# Patient Record
Sex: Male | Born: 1935 | Race: White | Hispanic: No | Marital: Married | Smoking: Former smoker
Health system: Southern US, Community
[De-identification: ages and names within clinical notes are randomized; demographics above are authoritative.]

## PROBLEM LIST (undated history)

## (undated) DIAGNOSIS — F0280 Dementia in other diseases classified elsewhere without behavioral disturbance: Secondary | ICD-10-CM

## (undated) DIAGNOSIS — F0390 Unspecified dementia without behavioral disturbance: Secondary | ICD-10-CM

## (undated) DIAGNOSIS — C61 Malignant neoplasm of prostate: Secondary | ICD-10-CM

## (undated) DIAGNOSIS — I10 Essential (primary) hypertension: Secondary | ICD-10-CM

## (undated) DIAGNOSIS — E785 Hyperlipidemia, unspecified: Secondary | ICD-10-CM

## (undated) DIAGNOSIS — D649 Anemia, unspecified: Secondary | ICD-10-CM

## (undated) HISTORY — DX: Hyperlipidemia, unspecified: E78.5

## (undated) HISTORY — DX: Anemia, unspecified: D64.9

## (undated) HISTORY — DX: Essential (primary) hypertension: I10

## (undated) HISTORY — PX: CARPAL TUNNEL RELEASE: SHX101

## (undated) HISTORY — PX: HERNIA REPAIR: SHX51

## (undated) HISTORY — DX: Malignant neoplasm of prostate: C61

## (undated) HISTORY — PX: KNEE SURGERY: SHX244

---

## 1999-08-24 HISTORY — PX: PROSTATECTOMY: SHX69

## 2011-09-07 DIAGNOSIS — L570 Actinic keratosis: Secondary | ICD-10-CM | POA: Diagnosis not present

## 2011-09-07 DIAGNOSIS — L821 Other seborrheic keratosis: Secondary | ICD-10-CM | POA: Diagnosis not present

## 2011-09-07 DIAGNOSIS — Z85828 Personal history of other malignant neoplasm of skin: Secondary | ICD-10-CM | POA: Diagnosis not present

## 2011-09-07 DIAGNOSIS — C44319 Basal cell carcinoma of skin of other parts of face: Secondary | ICD-10-CM | POA: Diagnosis not present

## 2011-09-07 DIAGNOSIS — I781 Nevus, non-neoplastic: Secondary | ICD-10-CM | POA: Diagnosis not present

## 2011-11-03 DIAGNOSIS — H35379 Puckering of macula, unspecified eye: Secondary | ICD-10-CM | POA: Diagnosis not present

## 2011-11-03 DIAGNOSIS — H353190 Nonexudative age-related macular degeneration, unspecified eye, stage unspecified: Secondary | ICD-10-CM | POA: Diagnosis not present

## 2012-02-08 DIAGNOSIS — I10 Essential (primary) hypertension: Secondary | ICD-10-CM | POA: Diagnosis not present

## 2012-03-29 DIAGNOSIS — C61 Malignant neoplasm of prostate: Secondary | ICD-10-CM | POA: Diagnosis not present

## 2012-05-04 DIAGNOSIS — Z23 Encounter for immunization: Secondary | ICD-10-CM | POA: Diagnosis not present

## 2012-05-04 DIAGNOSIS — IMO0002 Reserved for concepts with insufficient information to code with codable children: Secondary | ICD-10-CM | POA: Diagnosis not present

## 2012-05-04 DIAGNOSIS — R319 Hematuria, unspecified: Secondary | ICD-10-CM | POA: Diagnosis not present

## 2012-05-04 DIAGNOSIS — D649 Anemia, unspecified: Secondary | ICD-10-CM | POA: Diagnosis not present

## 2012-05-04 DIAGNOSIS — E785 Hyperlipidemia, unspecified: Secondary | ICD-10-CM | POA: Diagnosis not present

## 2012-06-12 DIAGNOSIS — R0982 Postnasal drip: Secondary | ICD-10-CM | POA: Diagnosis not present

## 2012-06-12 DIAGNOSIS — J309 Allergic rhinitis, unspecified: Secondary | ICD-10-CM | POA: Diagnosis not present

## 2012-06-12 DIAGNOSIS — Q674 Other congenital deformities of skull, face and jaw: Secondary | ICD-10-CM | POA: Diagnosis not present

## 2012-06-12 DIAGNOSIS — J311 Chronic nasopharyngitis: Secondary | ICD-10-CM | POA: Diagnosis not present

## 2012-06-14 DIAGNOSIS — H903 Sensorineural hearing loss, bilateral: Secondary | ICD-10-CM | POA: Diagnosis not present

## 2012-06-21 DIAGNOSIS — Z85828 Personal history of other malignant neoplasm of skin: Secondary | ICD-10-CM | POA: Diagnosis not present

## 2012-06-21 DIAGNOSIS — C44519 Basal cell carcinoma of skin of other part of trunk: Secondary | ICD-10-CM | POA: Diagnosis not present

## 2012-06-21 DIAGNOSIS — L570 Actinic keratosis: Secondary | ICD-10-CM | POA: Diagnosis not present

## 2012-07-08 DIAGNOSIS — J069 Acute upper respiratory infection, unspecified: Secondary | ICD-10-CM | POA: Diagnosis not present

## 2012-07-08 DIAGNOSIS — R0982 Postnasal drip: Secondary | ICD-10-CM | POA: Diagnosis not present

## 2012-09-22 DIAGNOSIS — L259 Unspecified contact dermatitis, unspecified cause: Secondary | ICD-10-CM | POA: Diagnosis not present

## 2012-09-22 DIAGNOSIS — E785 Hyperlipidemia, unspecified: Secondary | ICD-10-CM | POA: Diagnosis not present

## 2012-11-01 DIAGNOSIS — L538 Other specified erythematous conditions: Secondary | ICD-10-CM | POA: Diagnosis not present

## 2012-11-01 DIAGNOSIS — L570 Actinic keratosis: Secondary | ICD-10-CM | POA: Diagnosis not present

## 2013-03-21 DIAGNOSIS — Z8546 Personal history of malignant neoplasm of prostate: Secondary | ICD-10-CM | POA: Diagnosis not present

## 2013-03-21 DIAGNOSIS — D649 Anemia, unspecified: Secondary | ICD-10-CM | POA: Diagnosis not present

## 2013-03-21 DIAGNOSIS — I10 Essential (primary) hypertension: Secondary | ICD-10-CM | POA: Diagnosis not present

## 2013-03-21 DIAGNOSIS — E785 Hyperlipidemia, unspecified: Secondary | ICD-10-CM | POA: Diagnosis not present

## 2013-03-21 DIAGNOSIS — Z1331 Encounter for screening for depression: Secondary | ICD-10-CM | POA: Diagnosis not present

## 2013-03-21 DIAGNOSIS — L989 Disorder of the skin and subcutaneous tissue, unspecified: Secondary | ICD-10-CM | POA: Diagnosis not present

## 2013-03-21 DIAGNOSIS — IMO0002 Reserved for concepts with insufficient information to code with codable children: Secondary | ICD-10-CM | POA: Diagnosis not present

## 2013-04-04 DIAGNOSIS — C44320 Squamous cell carcinoma of skin of unspecified parts of face: Secondary | ICD-10-CM | POA: Diagnosis not present

## 2013-04-04 DIAGNOSIS — L821 Other seborrheic keratosis: Secondary | ICD-10-CM | POA: Diagnosis not present

## 2013-04-04 DIAGNOSIS — C44721 Squamous cell carcinoma of skin of unspecified lower limb, including hip: Secondary | ICD-10-CM | POA: Diagnosis not present

## 2013-04-04 DIAGNOSIS — L570 Actinic keratosis: Secondary | ICD-10-CM | POA: Diagnosis not present

## 2013-04-04 DIAGNOSIS — L259 Unspecified contact dermatitis, unspecified cause: Secondary | ICD-10-CM | POA: Diagnosis not present

## 2013-05-07 DIAGNOSIS — L219 Seborrheic dermatitis, unspecified: Secondary | ICD-10-CM | POA: Diagnosis not present

## 2013-05-07 DIAGNOSIS — Z85828 Personal history of other malignant neoplasm of skin: Secondary | ICD-10-CM | POA: Diagnosis not present

## 2013-05-30 DIAGNOSIS — Z23 Encounter for immunization: Secondary | ICD-10-CM | POA: Diagnosis not present

## 2013-06-13 DIAGNOSIS — D485 Neoplasm of uncertain behavior of skin: Secondary | ICD-10-CM | POA: Diagnosis not present

## 2013-06-13 DIAGNOSIS — C44721 Squamous cell carcinoma of skin of unspecified lower limb, including hip: Secondary | ICD-10-CM | POA: Diagnosis not present

## 2013-08-22 DIAGNOSIS — B9789 Other viral agents as the cause of diseases classified elsewhere: Secondary | ICD-10-CM | POA: Diagnosis not present

## 2013-08-22 DIAGNOSIS — IMO0002 Reserved for concepts with insufficient information to code with codable children: Secondary | ICD-10-CM | POA: Diagnosis not present

## 2013-08-22 DIAGNOSIS — R05 Cough: Secondary | ICD-10-CM | POA: Diagnosis not present

## 2013-11-08 DIAGNOSIS — H353190 Nonexudative age-related macular degeneration, unspecified eye, stage unspecified: Secondary | ICD-10-CM | POA: Diagnosis not present

## 2014-01-01 DIAGNOSIS — L570 Actinic keratosis: Secondary | ICD-10-CM | POA: Diagnosis not present

## 2014-01-01 DIAGNOSIS — D235 Other benign neoplasm of skin of trunk: Secondary | ICD-10-CM | POA: Diagnosis not present

## 2014-01-01 DIAGNOSIS — C44519 Basal cell carcinoma of skin of other part of trunk: Secondary | ICD-10-CM | POA: Diagnosis not present

## 2014-01-01 DIAGNOSIS — L538 Other specified erythematous conditions: Secondary | ICD-10-CM | POA: Diagnosis not present

## 2014-03-15 DIAGNOSIS — R5381 Other malaise: Secondary | ICD-10-CM | POA: Diagnosis not present

## 2014-03-15 DIAGNOSIS — IMO0002 Reserved for concepts with insufficient information to code with codable children: Secondary | ICD-10-CM | POA: Diagnosis not present

## 2014-03-15 DIAGNOSIS — I10 Essential (primary) hypertension: Secondary | ICD-10-CM | POA: Diagnosis not present

## 2014-03-15 DIAGNOSIS — R5383 Other fatigue: Secondary | ICD-10-CM | POA: Diagnosis not present

## 2014-03-18 DIAGNOSIS — E785 Hyperlipidemia, unspecified: Secondary | ICD-10-CM | POA: Diagnosis not present

## 2014-08-29 ENCOUNTER — Encounter: Payer: Self-pay | Admitting: Cardiovascular Disease

## 2014-08-29 ENCOUNTER — Ambulatory Visit (INDEPENDENT_AMBULATORY_CARE_PROVIDER_SITE_OTHER): Payer: Medicare Other | Admitting: Cardiovascular Disease

## 2014-08-29 VITALS — BP 98/54 | HR 66 | Ht 70.0 in | Wt 163.8 lb

## 2014-08-29 DIAGNOSIS — G9001 Carotid sinus syncope: Secondary | ICD-10-CM

## 2014-08-29 DIAGNOSIS — R55 Syncope and collapse: Secondary | ICD-10-CM

## 2014-08-29 MED ORDER — AMLODIPINE BESYLATE 2.5 MG PO TABS: 2.5000 mg | ORAL_TABLET | Freq: Every day | ORAL | Status: DC

## 2014-08-29 NOTE — Patient Instructions (Addendum)
Your physician has recommended you make the following change in your medication:  1) DECREASE Amlodipine to 2.5mg  daily  Your physician has requested that you have an echocardiogram. Echocardiography is a painless test that uses sound waves to create images of your heart. It provides your doctor with information about the size and shape of your heart and how well your heart's chambers and valves are working. This procedure takes approximately one hour. There are no restrictions for this procedure.  Your physician has recommended that you wear an event monitor. Event monitors are medical devices that record the heart's electrical activity. Doctors most often [place] these monitors to diagnose arrhythmias. Arrhythmias are problems with the speed or rhythm of the heartbeat. The monitor is a small, portable device. You can wear one while you do your normal daily activities. This is usually used to diagnose what is causing palpitations/syncope (passing out).  Your physician has requested that you have a carotid duplex. This test is an ultrasound of the carotid arteries in your neck. It looks at blood flow through these arteries that supply the brain with blood. Allow one hour for this exam. There are no restrictions or special instructions.  Your physician recommends that you schedule a follow-up appointment in: 6 weeks with Dr. Acie Fredrickson.

## 2014-08-29 NOTE — Progress Notes (Signed)
Boris Sharper Date of Birth  [birth year]       [facility]    [employer] [address], [place], [place]  [address]   [place],   [address] [place]   Fax  [phone]     Fax [phone]  Problem List: 1. Hypertension  History of Present Illness:  Mr. Garden is an 81 yo , hx of HTN. Works out regularly.  Does the elliptical without any difficulty. Has had some mild episodes of faint feeling.  Occurred during  Exercising.  Occasionally has orthostatic hypotension. A month ago. He was driving and felt a bit faint.  Thought that he might need to pull over to the side of the road. Drinks water while exercising,   Episodes are not always related to exercise  6-7 years ago he had an episode of very high BP.  He was started on BP meds at that time. Resolved after several hours,    Non smoker ETOH 2 drinks a day Fhx:  +, mother died at 70, father died age 13 , sister died of complications related to alzheimers. , brother had valve replacement. Retired from [employer].   Moved from [place], [place] several years ago.  [place].    Current Outpatient Prescriptions  Medication Sig Dispense Refill  . amLODipine (NORVASC) 5 MG tablet Take 5 mg by mouth daily.    . cyanocobalamin 2000 MCG tablet Take 5,000 mcg by mouth daily.    . Fish Oil-Cholecalciferol (FISH OIL + D3) 1000-1000 MG-UNIT CAPS Take 1,400 mg by mouth daily.    . fluticasone (CUTIVATE) 0.05 % cream as needed.    [name] glucosamine-chondroitin 500-400 MG tablet Take 1 tablet by mouth 3 (three) times daily.    [name] losartan (COZAAR) 50 MG tablet Take 50 mg by mouth daily.    . Lutein-Zeaxanthin 25-5 MG CAPS Take by mouth daily.    . Multiple Vitamin (MULTIVITAMIN) tablet Take 1 tablet by mouth daily. CENTRUM SILVER    . simvastatin (ZOCOR) 20 MG tablet Take 20 mg by mouth daily.     No current facility-administered medications  for this visit.     No Known Allergies  Past Medical History  Diagnosis Date  . Hyperlipidemia   . Hypertension   . Anemia   . Prostate cancer     Past Surgical History  Procedure Laterality Date  . Prostatectomy  2001  . Hernia repair      MID 80'S    History  Smoking status  . Former Smoker  Smokeless tobacco  . Not on file    History  Alcohol Use  . Yes    Comment: SOCAILLY     Family History  Problem Relation Age of Onset  . CAD Mother   . CAD Father   . Dementia Sister   . Heart attack Sister   . Alzheimer's disease Brother     Reviw of Systems:  Reviewed in the HPI.  All other systems are negative.  Physical Exam: Blood pressure 98/54, pulse 66, height 5\' 10"  (1.778 m), weight 163 lb 12.8 oz (74.299 kg). Wt Readings from Last 3 Encounters:  08/29/14 163 lb 12.8 oz (74.299 kg)     General: Well developed, well nourished, in no acute distress.  Head: Normocephalic, atraumatic, sclera non-icteric, mucus membranes are moist,   Neck: Supple. Carotids are 2 + without bruits. No  JVD  With carotid sinus massage, he had a 3 second pause.   Lungs: Clear   Heart: Rr, ,normal s1s2  Abdomen: Soft, non-tender, non-distended with normal bowel sounds.  Msk:  Strength and tone are normal   Extremities: No clubbing or cyanosis. No edema.  Distal pedal pulses are 2+ and equal    Neuro: CN II - XII intact.  Alert and oriented X 3.   Psych:  Normal   ECG: Jan. 7, 2016:  NSR , RBBB,   Assessment / Plan:

## 2014-08-29 NOTE — Assessment & Plan Note (Addendum)
His episodes of syncope that occurred her symptoms through the day. He has episodes of orthostatic hypotension but clearly has episodes that are not related to orthostatic hypotension. I demonstrated carotid sinus hypersensitivity today but he does not think that his episodes of syncope and near-syncope are always due to twisting or turning his neck. He does describe getting dizzy whenever he looks up.  We'll get an echocardiogram for further assessment of his LV function. We'll get a carotid duplex scan. We'll place a 30 day event monitor.  Will decrease his dose of amlodipine to 2.5 a day to see if this helps.   Encouraged him to stay hydrated.  I will see him  in 6 weeks.

## 2014-08-29 NOTE — Assessment & Plan Note (Signed)
He had a 3 second pause with carotid sinus massage. I've advised him to not wear tight fitting collared shirts.

## 2014-09-03 DIAGNOSIS — Z0000 Encounter for general adult medical examination without abnormal findings: Secondary | ICD-10-CM | POA: Diagnosis not present

## 2014-09-03 DIAGNOSIS — I10 Essential (primary) hypertension: Secondary | ICD-10-CM | POA: Diagnosis not present

## 2014-09-03 DIAGNOSIS — Z125 Encounter for screening for malignant neoplasm of prostate: Secondary | ICD-10-CM | POA: Diagnosis not present

## 2014-09-03 DIAGNOSIS — E785 Hyperlipidemia, unspecified: Secondary | ICD-10-CM | POA: Diagnosis not present

## 2014-09-04 ENCOUNTER — Encounter (INDEPENDENT_AMBULATORY_CARE_PROVIDER_SITE_OTHER): Payer: Medicare Other

## 2014-09-04 ENCOUNTER — Ambulatory Visit (HOSPITAL_BASED_OUTPATIENT_CLINIC_OR_DEPARTMENT_OTHER): Payer: Medicare Other | Admitting: *Deleted

## 2014-09-04 ENCOUNTER — Ambulatory Visit (HOSPITAL_COMMUNITY): Payer: Medicare Other | Attending: Cardiovascular Disease | Admitting: Radiology

## 2014-09-04 ENCOUNTER — Encounter: Payer: Self-pay | Admitting: *Deleted

## 2014-09-04 DIAGNOSIS — R55 Syncope and collapse: Secondary | ICD-10-CM

## 2014-09-04 DIAGNOSIS — G9001 Carotid sinus syncope: Secondary | ICD-10-CM | POA: Diagnosis not present

## 2014-09-04 DIAGNOSIS — I6529 Occlusion and stenosis of unspecified carotid artery: Secondary | ICD-10-CM

## 2014-09-04 DIAGNOSIS — Z87891 Personal history of nicotine dependence: Secondary | ICD-10-CM | POA: Diagnosis not present

## 2014-09-04 DIAGNOSIS — I10 Essential (primary) hypertension: Secondary | ICD-10-CM | POA: Diagnosis not present

## 2014-09-04 DIAGNOSIS — E785 Hyperlipidemia, unspecified: Secondary | ICD-10-CM | POA: Insufficient documentation

## 2014-09-04 NOTE — Progress Notes (Signed)
Carotid Duplex Exam Performed - 1-39% bilateral ICA stensoes

## 2014-09-04 NOTE — Progress Notes (Signed)
Patient ID: Julian [name], male   DOB: 1926-08-01, 81 y.o.   MRN: [id number] [employer] 30 day cardiac event monitor applied to patient.

## 2014-09-04 NOTE — Progress Notes (Signed)
Echocardiogram performed.  

## 2014-09-06 DIAGNOSIS — Z1212 Encounter for screening for malignant neoplasm of rectum: Secondary | ICD-10-CM | POA: Diagnosis not present

## 2014-09-10 DIAGNOSIS — Z8546 Personal history of malignant neoplasm of prostate: Secondary | ICD-10-CM | POA: Diagnosis not present

## 2014-09-10 DIAGNOSIS — E785 Hyperlipidemia, unspecified: Secondary | ICD-10-CM | POA: Diagnosis not present

## 2014-09-10 DIAGNOSIS — R42 Dizziness and giddiness: Secondary | ICD-10-CM | POA: Diagnosis not present

## 2014-09-10 DIAGNOSIS — L989 Disorder of the skin and subcutaneous tissue, unspecified: Secondary | ICD-10-CM | POA: Diagnosis not present

## 2014-09-10 DIAGNOSIS — I10 Essential (primary) hypertension: Secondary | ICD-10-CM | POA: Diagnosis not present

## 2014-09-10 DIAGNOSIS — D649 Anemia, unspecified: Secondary | ICD-10-CM | POA: Diagnosis not present

## 2014-09-10 DIAGNOSIS — E039 Hypothyroidism, unspecified: Secondary | ICD-10-CM | POA: Diagnosis not present

## 2014-09-10 DIAGNOSIS — Z23 Encounter for immunization: Secondary | ICD-10-CM | POA: Diagnosis not present

## 2014-09-10 DIAGNOSIS — Z0000 Encounter for general adult medical examination without abnormal findings: Secondary | ICD-10-CM | POA: Diagnosis not present

## 2014-09-10 DIAGNOSIS — Z6823 Body mass index (BMI) 23.0-23.9, adult: Secondary | ICD-10-CM | POA: Diagnosis not present

## 2014-09-18 DIAGNOSIS — Z13820 Encounter for screening for osteoporosis: Secondary | ICD-10-CM | POA: Diagnosis not present

## 2014-09-25 DIAGNOSIS — I10 Essential (primary) hypertension: Secondary | ICD-10-CM | POA: Diagnosis not present

## 2014-10-11 ENCOUNTER — Telehealth: Payer: Self-pay | Admitting: Nurse Practitioner

## 2014-10-11 NOTE — Telephone Encounter (Signed)
Spoke with patient and notified him of monitor results; per Dr. Acie Fredrickson monitor shows normal sinus rhythm with no atrial fibrillation.  Patient verbalized understanding and gratitude and I scheduled his follow-up appointment for 3/2.

## 2014-10-17 DIAGNOSIS — D485 Neoplasm of uncertain behavior of skin: Secondary | ICD-10-CM | POA: Diagnosis not present

## 2014-10-17 DIAGNOSIS — Z85828 Personal history of other malignant neoplasm of skin: Secondary | ICD-10-CM | POA: Diagnosis not present

## 2014-10-17 DIAGNOSIS — L570 Actinic keratosis: Secondary | ICD-10-CM | POA: Diagnosis not present

## 2014-10-17 DIAGNOSIS — L821 Other seborrheic keratosis: Secondary | ICD-10-CM | POA: Diagnosis not present

## 2014-10-17 DIAGNOSIS — D229 Melanocytic nevi, unspecified: Secondary | ICD-10-CM | POA: Diagnosis not present

## 2014-10-17 DIAGNOSIS — C44612 Basal cell carcinoma of skin of right upper limb, including shoulder: Secondary | ICD-10-CM | POA: Diagnosis not present

## 2014-10-23 ENCOUNTER — Ambulatory Visit (INDEPENDENT_AMBULATORY_CARE_PROVIDER_SITE_OTHER): Payer: Medicare Other | Admitting: Cardiovascular Disease

## 2014-10-23 ENCOUNTER — Encounter: Payer: Self-pay | Admitting: Cardiovascular Disease

## 2014-10-23 VITALS — BP 136/64 | HR 68 | Ht 70.0 in | Wt 165.0 lb

## 2014-10-23 DIAGNOSIS — I10 Essential (primary) hypertension: Secondary | ICD-10-CM | POA: Diagnosis not present

## 2014-10-23 NOTE — Progress Notes (Signed)
Cardiology Office Note   Date:  10/23/2014   ID:  Julian [name], DOB Jun 17, 1926, MRN [id number]  PCP:  No primary care provider on file.  Cardiologist:   Martika Egler, Wonda Cheng, MD   No chief complaint on file.  1. Hypertension  History of Present Illness:  Julian [name] is an 81 yo , hx of HTN. Works out regularly. Does the elliptical without any difficulty. Has had some mild episodes of faint feeling. Occurred during Exercising. Occasionally has orthostatic hypotension. A month ago. He was driving and felt a bit faint. Thought that he might need to pull over to the side of the road. Drinks water while exercising,  Episodes are not always related to exercise  6-7 years ago he had an episode of very high BP. He was started on BP meds at that time. Resolved after several hours,   Non smoker ETOH 2 drinks a day Fhx: +, mother died at 80, father died age 39 , sister died of complications related to alzheimers. , brother had valve replacement. Retired from [employer].  Moved from [place], [place] several years ago. [place].    10/23/2014: Julian [name] is a 81 y.o. male who presents for follow-up for his high blood pressure.  we decreased the amlodipine due to some orthostatic hypotension. BP has been ok at home.   Eats well most of the time.  Occasionally gets a bit of extra salt.   Still very active.    Past Medical History  Diagnosis Date  . Hyperlipidemia   . Hypertension   . Anemia   . Prostate cancer     Past Surgical History  Procedure Laterality Date  . Prostatectomy  2001  . Hernia repair      MID 80'S     Current Outpatient Prescriptions  Medication Sig Dispense Refill  . amLODipine (NORVASC) 2.5 MG tablet Take 1 tablet (2.5 mg total) by mouth daily. 90 tablet 3  . Cyanocobalamin (B-12) 5000 MCG CAPS Take 1 tablet by mouth daily.    [name] Fish Oil-Cholecalciferol (FISH OIL + D3 PO) Take 1,400 mg by mouth daily.    . fluticasone  (CUTIVATE) 0.05 % cream as needed.    [name] glucosamine-chondroitin 500-400 MG tablet Take 1 tablet by mouth 3 (three) times daily.    [name] losartan (COZAAR) 50 MG tablet Take 50 mg by mouth daily.    . Lutein-Zeaxanthin 25-5 MG CAPS Take by mouth daily.    . Multiple Vitamin (MULTIVITAMIN) tablet Take 1 tablet by mouth daily. CENTRUM SILVER    . simvastatin (ZOCOR) 20 MG tablet Take 20 mg by mouth daily.     No current facility-administered medications for this visit.    Allergies:   Review of patient's allergies indicates no known allergies.    Social History:  The patient  reports that he has quit smoking. He does not have any smokeless tobacco history on file. He reports that he drinks alcohol. He reports that he does not use illicit drugs.   Family History:  The patient's family history includes Alzheimer's disease in his brother; CAD in his father and mother; Dementia in his sister; Heart attack in his sister.    ROS:  Please see the history of present illness.    Review of Systems: Constitutional:  denies fever, chills, diaphoresis, appetite change and fatigue.  HEENT: denies photophobia, eye pain, redness, hearing loss, ear pain, congestion, sore throat, rhinorrhea, sneezing, neck pain, neck stiffness and tinnitus.  Respiratory: denies SOB, DOE, cough, chest tightness, and wheezing.  Cardiovascular: denies chest pain, palpitations and leg swelling.  Gastrointestinal: denies nausea, vomiting, abdominal pain, diarrhea, constipation, blood in stool.  Genitourinary: denies dysuria, urgency, frequency, hematuria, flank pain and difficulty urinating.  Musculoskeletal: denies  myalgias, back pain, joint swelling, arthralgias and gait problem.   Skin: denies pallor, rash and wound.  Neurological: denies dizziness, seizures, syncope, weakness, light-headedness, numbness and headaches.   Hematological: denies adenopathy, easy bruising, personal or family bleeding history.   Psychiatric/ Behavioral: denies suicidal ideation, mood changes, confusion, nervousness, sleep disturbance and agitation.       All other systems are reviewed and negative.    PHYSICAL EXAM: VS:  BP 136/64 mmHg  Pulse 68  Ht 5\' 10"  (1.778 m)  Wt 165 lb (74.844 kg)  BMI 23.68 kg/m2  SpO2 96% , BMI Body mass index is 23.68 kg/(m^2). GEN: Well nourished, well developed, in no acute distress HEENT: normal Neck: no JVD, carotid bruits, or masses Cardiac: RRR; 2/6 systolic murmur , rubs, or gallops,no edema  Respiratory:  clear to auscultation bilaterally, normal work of breathing GI: soft, nontender, nondistended, + BS MS: no deformity or atrophy Skin: warm and dry, no rash Neuro:  Strength and sensation are intact Psych: normal   EKG:  EKG is not ordered today.    Recent Labs: No results found for requested labs within last 365 days.    Lipid Panel No results found for: CHOL, TRIG, HDL, CHOLHDL, VLDL, LDLCALC, LDLDIRECT    Wt Readings from Last 3 Encounters:  10/23/14 165 lb (74.844 kg)  08/29/14 163 lb 12.8 oz (74.299 kg)      Other studies Reviewed: Additional studies/ records that were reviewed today include: . Review of the above records demonstrates:    ASSESSMENT AND PLAN:  1.  Essential hypertension:- [name] is doing fairly well. We'll continue with his same medications. He's relatively healthy.  2. Aortic sclerosis: He has a soft systolic murmur. Echocardiogram performed in January shows ossification of his aortic valve. There is no significant stenosis. He also has mild tricuspid regurgitation.  3. Tricuspid regurgitation: Benign. We'll continue to follow.   Current medicines are reviewed at length with the patient today.  The patient does not have concerns regarding medicines.  The following changes have been made:  no change   Disposition:   FU with me in 1 year.     Signed, Tehilla Coffel, Wonda Cheng, MD  10/23/2014 11:06 AM    [facility] [place], [place], [place]  [address] Phone: [phone]; Fax: [phone]

## 2014-10-23 NOTE — Patient Instructions (Signed)
Your physician recommends that you continue on your current medications as directed. Please refer to the Current Medication list given to you today.  Your physician wants you to follow-up in: 1 year with Dr. Nahser.  You will receive a reminder letter in the mail two months in advance. If you don't receive a letter, please call our office to schedule the follow-up appointment.  

## 2014-10-31 DIAGNOSIS — Z6823 Body mass index (BMI) 23.0-23.9, adult: Secondary | ICD-10-CM | POA: Diagnosis not present

## 2014-10-31 DIAGNOSIS — M810 Age-related osteoporosis without current pathological fracture: Secondary | ICD-10-CM | POA: Diagnosis not present

## 2014-12-09 DIAGNOSIS — H3531 Nonexudative age-related macular degeneration: Secondary | ICD-10-CM | POA: Diagnosis not present

## 2014-12-09 DIAGNOSIS — H524 Presbyopia: Secondary | ICD-10-CM | POA: Diagnosis not present

## 2014-12-26 DIAGNOSIS — D485 Neoplasm of uncertain behavior of skin: Secondary | ICD-10-CM | POA: Diagnosis not present

## 2014-12-26 DIAGNOSIS — C44612 Basal cell carcinoma of skin of right upper limb, including shoulder: Secondary | ICD-10-CM | POA: Diagnosis not present

## 2014-12-26 DIAGNOSIS — D074 Carcinoma in situ of penis: Secondary | ICD-10-CM | POA: Diagnosis not present

## 2015-01-08 DIAGNOSIS — D048 Carcinoma in situ of skin of other sites: Secondary | ICD-10-CM | POA: Diagnosis not present

## 2015-02-11 DIAGNOSIS — H3531 Nonexudative age-related macular degeneration: Secondary | ICD-10-CM | POA: Diagnosis not present

## 2015-04-22 ENCOUNTER — Encounter: Payer: Self-pay | Admitting: Cardiovascular Disease

## 2015-04-22 ENCOUNTER — Ambulatory Visit (INDEPENDENT_AMBULATORY_CARE_PROVIDER_SITE_OTHER): Payer: Medicare Other | Admitting: Cardiovascular Disease

## 2015-04-22 VITALS — BP 110/48 | HR 93 | Ht 70.0 in | Wt 164.8 lb

## 2015-04-22 DIAGNOSIS — I10 Essential (primary) hypertension: Secondary | ICD-10-CM

## 2015-04-22 DIAGNOSIS — E785 Hyperlipidemia, unspecified: Secondary | ICD-10-CM | POA: Diagnosis not present

## 2015-04-22 MED ORDER — ATORVASTATIN CALCIUM 40 MG PO TABS: 40.0000 mg | ORAL_TABLET | Freq: Every day | ORAL | Status: DC

## 2015-04-22 MED ORDER — LOSARTAN POTASSIUM 100 MG PO TABS: 100.0000 mg | ORAL_TABLET | Freq: Every day | ORAL | Status: DC

## 2015-04-22 NOTE — Patient Instructions (Addendum)
Medication Instructions:  STOP Simvastatin STOP Amlodipine INCREASE Losartan to 100 mg once daily START Atorvastatin 40 mg once daily  Labwork: Your physician recommends that you return for lab work in: 3 months on the day of or a few days before your office visit with Dr. Acie Fredrickson.  You will need to FAST for this appointment - nothing to eat or drink after midnight the night before except water.   Testing/Procedures: None Ordered   Follow-Up: Your physician recommends that you schedule a follow-up appointment in: 3 months with Dr. Acie Fredrickson.

## 2015-04-22 NOTE — Progress Notes (Signed)
Cardiology Office Note   Date:  04/22/2015   ID:  Julian [name], DOB July 24, 1926, MRN [id number]  PCP:  No primary care provider on file.  Cardiologist:   Macario Shear, Wonda Cheng, MD   Chief Complaint  Patient presents with  . Essential hypertension   1. Hypertension  History of Present Illness:  Julian [name] is an 81 yo , hx of HTN. Works out regularly. Does the elliptical without any difficulty. Has had some mild episodes of faint feeling. Occurred during Exercising. Occasionally has orthostatic hypotension. A month ago. He was driving and felt a bit faint. Thought that he might need to pull over to the side of the road. Drinks water while exercising,  Episodes are not always related to exercise  6-7 years ago he had an episode of very high BP. He was started on BP meds at that time. Resolved after several hours,   Non smoker ETOH 2 drinks a day Fhx: +, mother died at 75, father died age 45 , sister died of complications related to alzheimers. , brother had valve replacement. Retired from [employer].  Moved from [place], [place] several years ago. [place].    10/23/2014: Julian [name] is a 81 y.o. male who presents for follow-up for his high blood pressure.  we decreased the amlodipine due to some orthostatic hypotension. BP has been ok at home.   Eats well most of the time.  Occasionally gets a bit of extra salt.   Still very active.    April 22, 2015:  still having some orthostatic hypotension.  BP is normal at  Has had some occasional palpitations  - also sees it registers on his BP cuff. Gives an error message .  His amlodipine dose was reduced about a year ago .  Still has episodes of dizziness - only lasts about 2-3 seconds  These episodes can occur while seated - and even had occurred while driving  Do not occur when he has been exercising .  Does the elliptical machine for 40 minutes without any CP or dyspnea, no dizziness  Then  works out with [employer].    Past Medical History  Diagnosis Date  . Hyperlipidemia   . Hypertension   . Anemia   . Prostate cancer     Past Surgical History  Procedure Laterality Date  . Prostatectomy  2001  . Hernia repair      MID 80'S     Current Outpatient Prescriptions  Medication Sig Dispense Refill  . alendronate (FOSAMAX) 70 MG tablet Take 70 mg by mouth once a week. Take with a full glass of water on an empty stomach.    [name] amLODipine (NORVASC) 2.5 MG tablet Take 1 tablet (2.5 mg total) by mouth daily. 90 tablet 3  . Cyanocobalamin (B-12) 5000 MCG CAPS Take 1 tablet by mouth daily.    [name] Fish Oil-Cholecalciferol (FISH OIL + D3 PO) Take 1,400 mg by mouth daily.    . fluticasone (CUTIVATE) 0.05 % cream Apply 1 application topically 2 (two) times daily.    . fluticasone (FLONASE) 50 MCG/ACT nasal spray Place 2 sprays into both nostrils as needed for allergies or rhinitis.    [name] glucosamine-chondroitin 500-400 MG tablet Take 1 tablet by mouth 3 (three) times daily.    [name] losartan (COZAAR) 50 MG tablet Take 50 mg by mouth daily.    . Lutein-Zeaxanthin 25-5 MG CAPS Take by mouth daily.    . Multiple Vitamin (MULTIVITAMIN) tablet Take  1 tablet by mouth daily. CENTRUM SILVER    . simvastatin (ZOCOR) 20 MG tablet Take 20 mg by mouth daily.     No current facility-administered medications for this visit.    Allergies:   Review of patient's allergies indicates no known allergies.    Social History:  The patient  reports that he has quit smoking. He does not have any smokeless tobacco history on file. He reports that he drinks alcohol. He reports that he does not use illicit drugs.   Family History:  The patient's family history includes Alzheimer's disease in his brother; CAD in his father and mother; Dementia in his sister; Heart attack in his sister.    ROS:  Please see the history of present illness.    Review of Systems: Constitutional:  denies fever, chills, diaphoresis,  appetite change and fatigue.  HEENT: denies photophobia, eye pain, redness, hearing loss, ear pain, congestion, sore throat, rhinorrhea, sneezing, neck pain, neck stiffness and tinnitus.  Respiratory: denies SOB, DOE, cough, chest tightness, and wheezing.  Cardiovascular: denies chest pain, palpitations and leg swelling.  Gastrointestinal: denies nausea, vomiting, abdominal pain, diarrhea, constipation, blood in stool.  Genitourinary: denies dysuria, urgency, frequency, hematuria, flank pain and difficulty urinating.  Musculoskeletal: denies  myalgias, back pain, joint swelling, arthralgias and gait problem.   Skin: denies pallor, rash and wound.  Neurological: denies dizziness, seizures, syncope, weakness, light-headedness, numbness and headaches.   Hematological: denies adenopathy, easy bruising, personal or family bleeding history.  Psychiatric/ Behavioral: denies suicidal ideation, mood changes, confusion, nervousness, sleep disturbance and agitation.       All other systems are reviewed and negative.    PHYSICAL EXAM: VS:  BP 110/48 mmHg  Pulse 93  Ht 5\' 10"  (1.778 m)  Wt 74.753 kg (164 lb 12.8 oz)  BMI 23.65 kg/m2  SpO2 93% , BMI Body mass index is 23.65 kg/(m^2). GEN: Well nourished, well developed, in no acute distress HEENT: normal Neck: no JVD, carotid bruits, or masses Cardiac: RRR; 2/6 systolic murmur , rubs, or gallops,no edema  Respiratory:  clear to auscultation bilaterally, normal work of breathing GI: soft, nontender, nondistended, + BS MS: no deformity or atrophy Skin: warm and dry, no rash Neuro:  Strength and sensation are intact Psych: normal   EKG:  EKG is not ordered today.    Recent Labs: No results found for requested labs within last 365 days.    Lipid Panel No results found for: CHOL, TRIG, HDL, CHOLHDL, VLDL, LDLCALC, LDLDIRECT    Wt Readings from Last 3 Encounters:  04/22/15 74.753 kg (164 lb 12.8 oz)  10/23/14 74.844 kg (165 lb)    08/29/14 74.299 kg (163 lb 12.8 oz)      Other studies Reviewed: Additional studies/ records that were reviewed today include: . Review of the above records demonstrates:    ASSESSMENT AND PLAN:  1.  Essential hypertension:- Ivis continues to have episodes of dizziness that last for 2 or 3 seconds. He just, his blood pressure has been slightly elevated. We had long discussion about the fact that as long as he was having symptoms of orthostatic hypertension that it would be difficult to add additional medications to control his blood pressure better. As a trial, we will discontinue the amlodipine and increase his losartan to 100 mg a day.  We may need to add low-dose carvedilol at his next visit.  2. Aortic sclerosis: He has a soft systolic murmur. Echocardiogram performed in January shows ossification of his aortic  valve. There is no significant stenosis. He also has mild tricuspid regurgitation.  3. Tricuspid regurgitation: Benign. We'll continue to follow.  4. Hyperlipidemia: Patient has been on simvastatin. His wife tells me that his most recent lipid levels have been mildly elevated. We will discontinue the simvastatin and start him on atorvastatin 40 mg a day. We'll check fasting lipid, liver enzymes, basic medical profile in 3 months.   Current medicines are reviewed at length with the patient today.  The patient does not have concerns regarding medicines   Disposition:   FU with me in 3 months      Signed, Cyanna Neace, Wonda Cheng, MD  04/22/2015 11:07 AM    [facility], [place], [place]  [address] Phone: [phone]; Fax: [phone]

## 2015-05-13 DIAGNOSIS — H04123 Dry eye syndrome of bilateral lacrimal glands: Secondary | ICD-10-CM | POA: Diagnosis not present

## 2015-05-13 DIAGNOSIS — H119 Unspecified disorder of conjunctiva: Secondary | ICD-10-CM | POA: Diagnosis not present

## 2015-05-13 DIAGNOSIS — H3532 Exudative age-related macular degeneration: Secondary | ICD-10-CM | POA: Diagnosis not present

## 2015-05-15 DIAGNOSIS — C884 Extranodal marginal zone B-cell lymphoma of mucosa-associated lymphoid tissue [MALT-lymphoma]: Secondary | ICD-10-CM | POA: Diagnosis not present

## 2015-05-15 DIAGNOSIS — H31001 Unspecified chorioretinal scars, right eye: Secondary | ICD-10-CM | POA: Diagnosis not present

## 2015-05-15 DIAGNOSIS — Z961 Presence of intraocular lens: Secondary | ICD-10-CM | POA: Diagnosis not present

## 2015-05-15 DIAGNOSIS — D4989 Neoplasm of unspecified behavior of other specified sites: Secondary | ICD-10-CM | POA: Diagnosis not present

## 2015-05-15 DIAGNOSIS — Z9842 Cataract extraction status, left eye: Secondary | ICD-10-CM | POA: Diagnosis not present

## 2015-05-15 DIAGNOSIS — H3530 Unspecified macular degeneration: Secondary | ICD-10-CM | POA: Diagnosis not present

## 2015-05-15 DIAGNOSIS — Z8546 Personal history of malignant neoplasm of prostate: Secondary | ICD-10-CM | POA: Diagnosis not present

## 2015-05-15 DIAGNOSIS — Z9841 Cataract extraction status, right eye: Secondary | ICD-10-CM | POA: Diagnosis not present

## 2015-05-15 DIAGNOSIS — I10 Essential (primary) hypertension: Secondary | ICD-10-CM | POA: Diagnosis not present

## 2015-05-15 DIAGNOSIS — Z87891 Personal history of nicotine dependence: Secondary | ICD-10-CM | POA: Diagnosis not present

## 2015-05-15 DIAGNOSIS — H1189 Other specified disorders of conjunctiva: Secondary | ICD-10-CM | POA: Diagnosis not present

## 2015-05-22 DIAGNOSIS — Z8669 Personal history of other diseases of the nervous system and sense organs: Secondary | ICD-10-CM | POA: Diagnosis not present

## 2015-05-22 DIAGNOSIS — Z87891 Personal history of nicotine dependence: Secondary | ICD-10-CM | POA: Diagnosis not present

## 2015-05-22 DIAGNOSIS — Z9841 Cataract extraction status, right eye: Secondary | ICD-10-CM | POA: Diagnosis not present

## 2015-05-22 DIAGNOSIS — I10 Essential (primary) hypertension: Secondary | ICD-10-CM | POA: Diagnosis not present

## 2015-05-22 DIAGNOSIS — Z9889 Other specified postprocedural states: Secondary | ICD-10-CM | POA: Diagnosis not present

## 2015-05-22 DIAGNOSIS — Z9842 Cataract extraction status, left eye: Secondary | ICD-10-CM | POA: Diagnosis not present

## 2015-05-22 DIAGNOSIS — Z48810 Encounter for surgical aftercare following surgery on the sense organs: Secondary | ICD-10-CM | POA: Diagnosis not present

## 2015-06-05 DIAGNOSIS — C884 Extranodal marginal zone B-cell lymphoma of mucosa-associated lymphoid tissue [MALT-lymphoma]: Secondary | ICD-10-CM | POA: Diagnosis not present

## 2015-06-13 DIAGNOSIS — Z87891 Personal history of nicotine dependence: Secondary | ICD-10-CM | POA: Diagnosis not present

## 2015-06-13 DIAGNOSIS — C884 Extranodal marginal zone B-cell lymphoma of mucosa-associated lymphoid tissue [MALT-lymphoma]: Secondary | ICD-10-CM | POA: Diagnosis not present

## 2015-06-25 DIAGNOSIS — C884 Extranodal marginal zone B-cell lymphoma of mucosa-associated lymphoid tissue [MALT-lymphoma]: Secondary | ICD-10-CM | POA: Diagnosis not present

## 2015-06-26 DIAGNOSIS — C884 Extranodal marginal zone B-cell lymphoma of mucosa-associated lymphoid tissue [MALT-lymphoma]: Secondary | ICD-10-CM | POA: Diagnosis not present

## 2015-07-01 DIAGNOSIS — Z23 Encounter for immunization: Secondary | ICD-10-CM | POA: Diagnosis not present

## 2015-07-02 DIAGNOSIS — Z85828 Personal history of other malignant neoplasm of skin: Secondary | ICD-10-CM | POA: Diagnosis not present

## 2015-07-02 DIAGNOSIS — L821 Other seborrheic keratosis: Secondary | ICD-10-CM | POA: Diagnosis not present

## 2015-07-02 DIAGNOSIS — D225 Melanocytic nevi of trunk: Secondary | ICD-10-CM | POA: Diagnosis not present

## 2015-07-02 DIAGNOSIS — L812 Freckles: Secondary | ICD-10-CM | POA: Diagnosis not present

## 2015-07-04 DIAGNOSIS — C884 Extranodal marginal zone B-cell lymphoma of mucosa-associated lymphoid tissue [MALT-lymphoma]: Secondary | ICD-10-CM | POA: Diagnosis not present

## 2015-07-07 ENCOUNTER — Telehealth: Payer: Self-pay | Admitting: Cardiovascular Disease

## 2015-07-07 MED ORDER — AMLODIPINE BESYLATE 2.5 MG PO TABS: 2.5000 mg | ORAL_TABLET | Freq: Every day | ORAL | Status: DC

## 2015-07-07 NOTE — Telephone Encounter (Signed)
New message      Pt c/o BP issue: STAT if pt c/o blurred vision, one-sided weakness or slurred speech  1. What are your last 5 BP readings? 189/79--80 2. Are you having any other symptoms (ex. Dizziness, headache, blurred vision, passed out)? no 3. What is your BP issue?  bp is too high

## 2015-07-07 NOTE — Telephone Encounter (Signed)
Spoke with patient's wife, Julian [name], who states patient's BP has been elevated [id number] mmHg systolic and 123456 mmHg diastolic for at least the past month.  She states patient is getting ready to undergo treatment for lymphoma and when he went to [facility] for evaluation they were concerned about BP.  At last office visit on 8/30, patient's BP was 110/48.  Wife states she is not aware of it being that low in quite a while.  She verified that patient is taking Losartan 100 mg once daily.  Amlodipine was d/c'ed at last office visit due to hypotension.  She states patient denies dizziness or headache; she states monitor alerts that heart rate is also irregular.  She states they have verified accuracy of home monitor by checking her BP as well.  I scheduled patient to see Dr. Acie Fredrickson on Wed. 11/16 at 8:30 and advised her to call back if symptoms worsen or if patient has additional concerns or questions.  She thanked me for the call.

## 2015-07-07 NOTE — Telephone Encounter (Signed)
Left message on wife's voice mail that per Dr. Acie Fredrickson, patient can resume amlodipine 2.5 mg and follow-up with him on Wednesday.  I advised in the message to call the office with questions or concerns.

## 2015-07-08 DIAGNOSIS — C884 Extranodal marginal zone B-cell lymphoma of mucosa-associated lymphoid tissue [MALT-lymphoma]: Secondary | ICD-10-CM | POA: Diagnosis not present

## 2015-07-09 ENCOUNTER — Encounter: Payer: Self-pay | Admitting: Cardiovascular Disease

## 2015-07-09 ENCOUNTER — Ambulatory Visit (INDEPENDENT_AMBULATORY_CARE_PROVIDER_SITE_OTHER): Payer: Medicare Other | Admitting: Cardiovascular Disease

## 2015-07-09 VITALS — BP 130/68 | HR 72 | Ht 70.0 in | Wt 165.4 lb

## 2015-07-09 DIAGNOSIS — E785 Hyperlipidemia, unspecified: Secondary | ICD-10-CM

## 2015-07-09 DIAGNOSIS — I10 Essential (primary) hypertension: Secondary | ICD-10-CM

## 2015-07-09 LAB — CBC WITH DIFFERENTIAL/PLATELET
Basophils Absolute: 0 10*3/uL (ref 0.0–0.1)
Basophils Relative: 0 % (ref 0–1)
Eosinophils Absolute: 0.2 10*3/uL (ref 0.0–0.7)
Eosinophils Relative: 3 % (ref 0–5)
HEMATOCRIT: 37.9 % — AB (ref 39.0–52.0)
HEMOGLOBIN: 12.9 g/dL — AB (ref 13.0–17.0)
LYMPHS ABS: 1.1 10*3/uL (ref 0.7–4.0)
LYMPHS PCT: 14 % (ref 12–46)
MCH: 27.9 pg (ref 26.0–34.0)
MCHC: 34 g/dL (ref 30.0–36.0)
MCV: 82 fL (ref 78.0–100.0)
MONO ABS: 0.6 10*3/uL (ref 0.1–1.0)
MONOS PCT: 8 % (ref 3–12)
MPV: 9.7 fL (ref 8.6–12.4)
NEUTROS ABS: 5.6 10*3/uL (ref 1.7–7.7)
NEUTROS PCT: 75 % (ref 43–77)
Platelets: 277 10*3/uL (ref 150–400)
RBC: 4.62 MIL/uL (ref 4.22–5.81)
RDW: 14.6 % (ref 11.5–15.5)
WBC: 7.5 10*3/uL (ref 4.0–10.5)

## 2015-07-09 LAB — LIPID PANEL
Cholesterol: 127 mg/dL (ref 125–200)
HDL: 49 mg/dL (ref >=40)
LDL CALC: 56 mg/dL (ref <130)
Total CHOL/HDL Ratio: 2.6 Ratio (ref <=5.0)
Triglycerides: 109 mg/dL (ref <150)
VLDL: 22 mg/dL (ref <30)

## 2015-07-09 LAB — TSH: TSH: 6.009 u[IU]/mL — AB (ref 0.350–4.500)

## 2015-07-09 LAB — BASIC METABOLIC PANEL
BUN: 19 mg/dL (ref 7–25)
CO2: 27 mmol/L (ref 20–31)
Calcium: 8.9 mg/dL (ref 8.6–10.3)
Chloride: 103 mmol/L (ref 98–110)
Creat: 0.94 mg/dL (ref 0.70–1.11)
GLUCOSE: 96 mg/dL (ref 65–99)
POTASSIUM: 4.3 mmol/L (ref 3.5–5.3)
SODIUM: 138 mmol/L (ref 135–146)

## 2015-07-09 LAB — HEPATIC FUNCTION PANEL
ALK PHOS: 105 U/L (ref 40–115)
ALT: 26 U/L (ref 9–46)
AST: 22 U/L (ref 10–35)
Albumin: 4 g/dL (ref 3.6–5.1)
BILIRUBIN INDIRECT: 0.9 mg/dL (ref 0.2–1.2)
Bilirubin, Direct: 0.2 mg/dL (ref <=0.2)
TOTAL PROTEIN: 6.8 g/dL (ref 6.1–8.1)
Total Bilirubin: 1.1 mg/dL (ref 0.2–1.2)

## 2015-07-09 LAB — MAGNESIUM: Magnesium: 2 mg/dL (ref 1.5–2.5)

## 2015-07-09 NOTE — Progress Notes (Signed)
Cardiology Office Note   Date:  07/09/2015   ID:  Julian [name], DOB 27-Apr-1926, MRN [id number]  PCP:  [name], MD  Cardiologist:   Acie Fredrickson Wonda Cheng, MD   Chief Complaint  Patient presents with  . Follow-up   1. Hypertension  History of Present Illness:  Julian [name] is an 81 yo , hx of HTN. Works out regularly. Does the elliptical without any difficulty. Has had some mild episodes of faint feeling. Occurred during Exercising. Occasionally has orthostatic hypotension. A month ago. He was driving and felt a bit faint. Thought that he might need to pull over to the side of the road. Drinks water while exercising,  Episodes are not always related to exercise  6-7 years ago he had an episode of very high BP. He was started on BP meds at that time. Resolved after several hours,   Non smoker ETOH 2 drinks a day Fhx: +, mother died at 46, father died age 57 , sister died of complications related to alzheimers. , brother had valve replacement. Retired from [employer].  Moved from [place], [place] several years ago. [place].    10/23/2014: Julian [name] is a 81 y.o. male who presents for follow-up for his high blood pressure.  we decreased the amlodipine due to some orthostatic hypotension. BP has been ok at home.   Eats well most of the time.  Occasionally gets a bit of extra salt.   Still very active.    April 22, 2015:  still having some orthostatic hypotension.  BP is normal at  Has had some occasional palpitations  - also sees it registers on his BP cuff. Gives an error message .  His amlodipine dose was reduced about a year ago .  Still has episodes of dizziness - only lasts about 2-3 seconds  These episodes can occur while seated - and even had occurred while driving  Do not occur when he has been exercising .  Does the elliptical machine for 40 minutes without any CP or dyspnea, no dizziness  Then works out with [employer].    07/09/2015:  Julian [name] is seen today for variable BP.   He had some orthostatic hypotension symptoms ,  We had stopped his amlodipine.  BP increased slightly .   We started back Amlodipine at 2. 5 mg a day  He brought his blood pressure log with him today. Most of his readings are mildly to moderately elevated.   Past Medical History  Diagnosis Date  . Hyperlipidemia   . Hypertension   . Anemia   . Prostate cancer [facility])     Past Surgical History  Procedure Laterality Date  . Prostatectomy  2001  . Hernia repair      MID 80'S     Current Outpatient Prescriptions  Medication Sig Dispense Refill  . alendronate (FOSAMAX) 70 MG tablet Take 70 mg by mouth once a week. Take with a full glass of water on an empty stomach.    [name] amLODipine (NORVASC) 2.5 MG tablet Take 1 tablet (2.5 mg total) by mouth daily. 180 tablet 3  . atorvastatin (LIPITOR) 40 MG tablet Take 1 tablet (40 mg total) by mouth daily. 90 tablet 3  . Cyanocobalamin (B-12) 5000 MCG CAPS Take 1 tablet by mouth daily.    [name] Fish Oil-Cholecalciferol (FISH OIL + D3 PO) Take 1,400 mg by mouth daily.    . fluticasone (CUTIVATE) 0.05 % cream Apply 1 application topically 2 (  two) times daily.    . fluticasone (FLONASE) 50 MCG/ACT nasal spray Place 2 sprays into both nostrils as needed for allergies or rhinitis.    [name] glucosamine-chondroitin 500-400 MG tablet Take 1 tablet by mouth 3 (three) times daily.    [name] losartan (COZAAR) 100 MG tablet Take 1 tablet (100 mg total) by mouth daily. 90 tablet 3  . Lutein-Zeaxanthin 25-5 MG CAPS Take by mouth daily.    . Multiple Vitamin (MULTIVITAMIN) tablet Take 1 tablet by mouth daily. CENTRUM SILVER     No current facility-administered medications for this visit.    Allergies:   Review of patient's allergies indicates no known allergies.    Social History:  The patient  reports that he has quit smoking. He does not have any smokeless tobacco history on file. He reports that he drinks  alcohol. He reports that he does not use illicit drugs.   Family History:  The patient's family history includes Alzheimer's disease in his brother; CAD in his father and mother; Dementia in his sister; Heart attack in his sister.    ROS:  Please see the history of present illness.    Review of Systems: Constitutional:  denies fever, chills, diaphoresis, appetite change and fatigue.  HEENT: denies photophobia, eye pain, redness, hearing loss, ear pain, congestion, sore throat, rhinorrhea, sneezing, neck pain, neck stiffness and tinnitus.  Respiratory: denies SOB, DOE, cough, chest tightness, and wheezing.  Cardiovascular: denies chest pain, palpitations and leg swelling.  Gastrointestinal: denies nausea, vomiting, abdominal pain, diarrhea, constipation, blood in stool.  Genitourinary: denies dysuria, urgency, frequency, hematuria, flank pain and difficulty urinating.  Musculoskeletal: denies  myalgias, back pain, joint swelling, arthralgias and gait problem.   Skin: denies pallor, rash and wound.  Neurological: denies dizziness, seizures, syncope, weakness, light-headedness, numbness and headaches.   Hematological: denies adenopathy, easy bruising, personal or family bleeding history.  Psychiatric/ Behavioral: denies suicidal ideation, mood changes, confusion, nervousness, sleep disturbance and agitation.       All other systems are reviewed and negative.    PHYSICAL EXAM: VS:  BP 130/68 mmHg  Pulse 72  Ht 5\' 10"  (1.778 m)  Wt 165 lb 6.4 oz (75.025 kg)  BMI 23.73 kg/m2 , BMI Body mass index is 23.73 kg/(m^2). GEN: Well nourished, well developed, in no acute distress HEENT: normal Neck: no JVD, carotid bruits, or masses Cardiac: RRR; A999333 systolic murmur , rubs, or gallops,no edema  Respiratory:  clear to auscultation bilaterally, normal work of breathing GI: soft, nontender, nondistended, + BS MS: no deformity or atrophy Skin: warm and dry, no rash Neuro:  Strength and  sensation are intact Psych: normal   EKG:  EKG is not ordered today.    Recent Labs: No results found for requested labs within last 365 days.    Lipid Panel No results found for: CHOL, TRIG, HDL, CHOLHDL, VLDL, LDLCALC, LDLDIRECT    Wt Readings from Last 3 Encounters:  07/09/15 165 lb 6.4 oz (75.025 kg)  04/22/15 164 lb 12.8 oz (74.753 kg)  10/23/14 165 lb (74.844 kg)    ECG:  .  NSR with PACs.  RBBB.   Other studies Reviewed: Additional studies/ records that were reviewed today include:  Review of the above records demonstrates:    ASSESSMENT AND PLAN:  1.  Essential hypertension:- We added amlodipine 2.5 mg to his medical regimen. He seems to be doing quite a bit better. His blood pressure is much better today. We'll check labs today including a basic  metabolic profile, magnesium level, liver enzymes, fasting lipids, and CBC.  2. Aortic sclerosis: He has a soft systolic murmur. Echocardiogram performed in January shows calcification of his aortic valve. There is no significant stenosis. He also has mild tricuspid regurgitation.  3. Tricuspid regurgitation: Benign. We'll continue to follow.  4. Hyperlipidemia: Patient has been on on atorvastatin 40 mg a day. We'll check labs today.   Current medicines are reviewed at length with the patient today.  The patient does not have concerns regarding medicines   Disposition:   FU with me in 3 months      Signed, Nahser, Wonda Cheng, MD  07/09/2015 9:03 AM    [facility] [address], [place], [place]  [address] Phone: [phone]; Fax: [phone]

## 2015-07-09 NOTE — Patient Instructions (Signed)
Medication Instructions:  Your physician recommends that you continue on your current medications as directed. Please refer to the Current Medication list given to you today.   Labwork: Bmet, Mag, Cbc, Tsh, Lft, Lipid today  Testing/Procedures: None ordered  Follow-Up: Your physician recommends that you schedule a follow-up appointment in: 3 months with Dr.Nahser   Any Other Special Instructions Will Be Listed Below (If Applicable).     If you need a refill on your cardiac medications before your next appointment, please call your pharmacy.

## 2015-07-14 DIAGNOSIS — Z510 Encounter for antineoplastic radiation therapy: Secondary | ICD-10-CM | POA: Diagnosis not present

## 2015-07-14 DIAGNOSIS — C884 Extranodal marginal zone B-cell lymphoma of mucosa-associated lymphoid tissue [MALT-lymphoma]: Secondary | ICD-10-CM | POA: Diagnosis not present

## 2015-07-16 DIAGNOSIS — Z510 Encounter for antineoplastic radiation therapy: Secondary | ICD-10-CM | POA: Diagnosis not present

## 2015-07-16 DIAGNOSIS — C884 Extranodal marginal zone B-cell lymphoma of mucosa-associated lymphoid tissue [MALT-lymphoma]: Secondary | ICD-10-CM | POA: Diagnosis not present

## 2015-07-21 DIAGNOSIS — Z510 Encounter for antineoplastic radiation therapy: Secondary | ICD-10-CM | POA: Diagnosis not present

## 2015-07-21 DIAGNOSIS — C884 Extranodal marginal zone B-cell lymphoma of mucosa-associated lymphoid tissue [MALT-lymphoma]: Secondary | ICD-10-CM | POA: Diagnosis not present

## 2015-07-22 ENCOUNTER — Other Ambulatory Visit: Payer: Medicare Other

## 2015-07-22 DIAGNOSIS — C884 Extranodal marginal zone B-cell lymphoma of mucosa-associated lymphoid tissue [MALT-lymphoma]: Secondary | ICD-10-CM | POA: Diagnosis not present

## 2015-07-22 DIAGNOSIS — Z510 Encounter for antineoplastic radiation therapy: Secondary | ICD-10-CM | POA: Diagnosis not present

## 2015-07-23 DIAGNOSIS — Z510 Encounter for antineoplastic radiation therapy: Secondary | ICD-10-CM | POA: Diagnosis not present

## 2015-07-23 DIAGNOSIS — C884 Extranodal marginal zone B-cell lymphoma of mucosa-associated lymphoid tissue [MALT-lymphoma]: Secondary | ICD-10-CM | POA: Diagnosis not present

## 2015-07-24 DIAGNOSIS — Z510 Encounter for antineoplastic radiation therapy: Secondary | ICD-10-CM | POA: Diagnosis not present

## 2015-07-24 DIAGNOSIS — C884 Extranodal marginal zone B-cell lymphoma of mucosa-associated lymphoid tissue [MALT-lymphoma]: Secondary | ICD-10-CM | POA: Diagnosis not present

## 2015-07-25 ENCOUNTER — Ambulatory Visit: Payer: Medicare Other | Admitting: Cardiovascular Disease

## 2015-07-25 DIAGNOSIS — C884 Extranodal marginal zone B-cell lymphoma of mucosa-associated lymphoid tissue [MALT-lymphoma]: Secondary | ICD-10-CM | POA: Diagnosis not present

## 2015-07-25 DIAGNOSIS — Z510 Encounter for antineoplastic radiation therapy: Secondary | ICD-10-CM | POA: Diagnosis not present

## 2015-07-28 DIAGNOSIS — Z510 Encounter for antineoplastic radiation therapy: Secondary | ICD-10-CM | POA: Diagnosis not present

## 2015-07-28 DIAGNOSIS — C884 Extranodal marginal zone B-cell lymphoma of mucosa-associated lymphoid tissue [MALT-lymphoma]: Secondary | ICD-10-CM | POA: Diagnosis not present

## 2015-07-29 DIAGNOSIS — C884 Extranodal marginal zone B-cell lymphoma of mucosa-associated lymphoid tissue [MALT-lymphoma]: Secondary | ICD-10-CM | POA: Diagnosis not present

## 2015-07-29 DIAGNOSIS — Z510 Encounter for antineoplastic radiation therapy: Secondary | ICD-10-CM | POA: Diagnosis not present

## 2015-07-30 DIAGNOSIS — C884 Extranodal marginal zone B-cell lymphoma of mucosa-associated lymphoid tissue [MALT-lymphoma]: Secondary | ICD-10-CM | POA: Diagnosis not present

## 2015-07-30 DIAGNOSIS — Z510 Encounter for antineoplastic radiation therapy: Secondary | ICD-10-CM | POA: Diagnosis not present

## 2015-07-31 DIAGNOSIS — Z510 Encounter for antineoplastic radiation therapy: Secondary | ICD-10-CM | POA: Diagnosis not present

## 2015-07-31 DIAGNOSIS — C884 Extranodal marginal zone B-cell lymphoma of mucosa-associated lymphoid tissue [MALT-lymphoma]: Secondary | ICD-10-CM | POA: Diagnosis not present

## 2015-08-01 DIAGNOSIS — C884 Extranodal marginal zone B-cell lymphoma of mucosa-associated lymphoid tissue [MALT-lymphoma]: Secondary | ICD-10-CM | POA: Diagnosis not present

## 2015-08-01 DIAGNOSIS — Z510 Encounter for antineoplastic radiation therapy: Secondary | ICD-10-CM | POA: Diagnosis not present

## 2015-08-04 DIAGNOSIS — C884 Extranodal marginal zone B-cell lymphoma of mucosa-associated lymphoid tissue [MALT-lymphoma]: Secondary | ICD-10-CM | POA: Diagnosis not present

## 2015-08-04 DIAGNOSIS — Z510 Encounter for antineoplastic radiation therapy: Secondary | ICD-10-CM | POA: Diagnosis not present

## 2015-08-05 DIAGNOSIS — Z510 Encounter for antineoplastic radiation therapy: Secondary | ICD-10-CM | POA: Diagnosis not present

## 2015-08-05 DIAGNOSIS — C884 Extranodal marginal zone B-cell lymphoma of mucosa-associated lymphoid tissue [MALT-lymphoma]: Secondary | ICD-10-CM | POA: Diagnosis not present

## 2015-09-01 DIAGNOSIS — E784 Other hyperlipidemia: Secondary | ICD-10-CM | POA: Diagnosis not present

## 2015-09-01 DIAGNOSIS — E038 Other specified hypothyroidism: Secondary | ICD-10-CM | POA: Diagnosis not present

## 2015-09-01 DIAGNOSIS — Z125 Encounter for screening for malignant neoplasm of prostate: Secondary | ICD-10-CM | POA: Diagnosis not present

## 2015-09-01 DIAGNOSIS — I10 Essential (primary) hypertension: Secondary | ICD-10-CM | POA: Diagnosis not present

## 2015-09-01 DIAGNOSIS — M810 Age-related osteoporosis without current pathological fracture: Secondary | ICD-10-CM | POA: Diagnosis not present

## 2015-09-03 DIAGNOSIS — C4339 Malignant melanoma of other parts of face: Secondary | ICD-10-CM | POA: Diagnosis not present

## 2015-09-03 DIAGNOSIS — M545 Low back pain: Secondary | ICD-10-CM | POA: Diagnosis not present

## 2015-09-10 ENCOUNTER — Other Ambulatory Visit: Payer: Self-pay | Admitting: Cardiovascular Disease

## 2015-09-10 DIAGNOSIS — I6523 Occlusion and stenosis of bilateral carotid arteries: Secondary | ICD-10-CM

## 2015-09-12 ENCOUNTER — Ambulatory Visit (HOSPITAL_COMMUNITY)
Admission: RE | Admit: 2015-09-12 | Discharge: 2015-09-12 | Disposition: A | Discharge status: Home / Self Care | Payer: Medicare Other | Source: Ambulatory Visit | Attending: Urology | Admitting: Urology

## 2015-09-12 DIAGNOSIS — E785 Hyperlipidemia, unspecified: Secondary | ICD-10-CM | POA: Diagnosis not present

## 2015-09-12 DIAGNOSIS — I10 Essential (primary) hypertension: Secondary | ICD-10-CM | POA: Insufficient documentation

## 2015-09-12 DIAGNOSIS — Z79899 Other long term (current) drug therapy: Secondary | ICD-10-CM | POA: Diagnosis not present

## 2015-09-12 DIAGNOSIS — I6523 Occlusion and stenosis of bilateral carotid arteries: Secondary | ICD-10-CM | POA: Diagnosis not present

## 2015-09-12 DIAGNOSIS — C884 Extranodal marginal zone B-cell lymphoma of mucosa-associated lymphoid tissue [MALT-lymphoma]: Secondary | ICD-10-CM | POA: Diagnosis not present

## 2015-09-12 DIAGNOSIS — Z8546 Personal history of malignant neoplasm of prostate: Secondary | ICD-10-CM | POA: Diagnosis not present

## 2015-09-15 DIAGNOSIS — C884 Extranodal marginal zone B-cell lymphoma of mucosa-associated lymphoid tissue [MALT-lymphoma]: Secondary | ICD-10-CM | POA: Diagnosis not present

## 2015-09-15 DIAGNOSIS — Z923 Personal history of irradiation: Secondary | ICD-10-CM | POA: Diagnosis not present

## 2015-09-19 DIAGNOSIS — Z6824 Body mass index (BMI) 24.0-24.9, adult: Secondary | ICD-10-CM | POA: Diagnosis not present

## 2015-09-19 DIAGNOSIS — Z23 Encounter for immunization: Secondary | ICD-10-CM | POA: Diagnosis not present

## 2015-09-19 DIAGNOSIS — E784 Other hyperlipidemia: Secondary | ICD-10-CM | POA: Diagnosis not present

## 2015-09-19 DIAGNOSIS — M810 Age-related osteoporosis without current pathological fracture: Secondary | ICD-10-CM | POA: Diagnosis not present

## 2015-09-19 DIAGNOSIS — Z1389 Encounter for screening for other disorder: Secondary | ICD-10-CM | POA: Diagnosis not present

## 2015-09-19 DIAGNOSIS — M545 Low back pain: Secondary | ICD-10-CM | POA: Diagnosis not present

## 2015-09-19 DIAGNOSIS — R42 Dizziness and giddiness: Secondary | ICD-10-CM | POA: Diagnosis not present

## 2015-09-19 DIAGNOSIS — Z0000 Encounter for general adult medical examination without abnormal findings: Secondary | ICD-10-CM | POA: Diagnosis not present

## 2015-09-19 DIAGNOSIS — C4339 Malignant melanoma of other parts of face: Secondary | ICD-10-CM | POA: Diagnosis not present

## 2015-09-19 DIAGNOSIS — Z8546 Personal history of malignant neoplasm of prostate: Secondary | ICD-10-CM | POA: Diagnosis not present

## 2015-09-19 DIAGNOSIS — E038 Other specified hypothyroidism: Secondary | ICD-10-CM | POA: Diagnosis not present

## 2015-09-19 DIAGNOSIS — I10 Essential (primary) hypertension: Secondary | ICD-10-CM | POA: Diagnosis not present

## 2015-09-30 DIAGNOSIS — Z87891 Personal history of nicotine dependence: Secondary | ICD-10-CM | POA: Diagnosis not present

## 2015-09-30 DIAGNOSIS — Z923 Personal history of irradiation: Secondary | ICD-10-CM | POA: Diagnosis not present

## 2015-09-30 DIAGNOSIS — H04123 Dry eye syndrome of bilateral lacrimal glands: Secondary | ICD-10-CM | POA: Diagnosis not present

## 2015-09-30 DIAGNOSIS — Z79899 Other long term (current) drug therapy: Secondary | ICD-10-CM | POA: Diagnosis not present

## 2015-09-30 DIAGNOSIS — C884 Extranodal marginal zone B-cell lymphoma of mucosa-associated lymphoid tissue [MALT-lymphoma]: Secondary | ICD-10-CM | POA: Diagnosis not present

## 2015-09-30 DIAGNOSIS — I10 Essential (primary) hypertension: Secondary | ICD-10-CM | POA: Diagnosis not present

## 2015-10-09 ENCOUNTER — Ambulatory Visit: Payer: Medicare Other | Admitting: Cardiovascular Disease

## 2015-10-27 ENCOUNTER — Ambulatory Visit (INDEPENDENT_AMBULATORY_CARE_PROVIDER_SITE_OTHER): Payer: Medicare Other | Admitting: Cardiovascular Disease

## 2015-10-27 ENCOUNTER — Encounter: Payer: Self-pay | Admitting: Cardiovascular Disease

## 2015-10-27 VITALS — BP 140/92 | HR 60 | Ht 70.0 in | Wt 166.0 lb

## 2015-10-27 DIAGNOSIS — I6523 Occlusion and stenosis of bilateral carotid arteries: Secondary | ICD-10-CM

## 2015-10-27 DIAGNOSIS — I10 Essential (primary) hypertension: Secondary | ICD-10-CM

## 2015-10-27 DIAGNOSIS — I779 Disorder of arteries and arterioles, unspecified: Secondary | ICD-10-CM | POA: Diagnosis not present

## 2015-10-27 DIAGNOSIS — I739 Peripheral vascular disease, unspecified: Principal | ICD-10-CM

## 2015-10-27 NOTE — Patient Instructions (Signed)
Medication Instructions:  Your physician recommends that you continue on your current medications as directed. Please refer to the Current Medication list given to you today.   Labwork: Your physician recommends that you return for lab work (basic metabolic panel) in: 1 year on the same day as your office visit   Testing/Procedures: None Ordered   Follow-Up: Your physician wants you to follow-up in: 1 year with Dr. Acie Fredrickson.  You will receive a reminder letter in the mail two months in advance. If you don't receive a letter, please call our office to schedule the follow-up appointment.   If you need a refill on your cardiac medications before your next appointment, please call your pharmacy.   Thank you for choosing [facility]! Christen Bame, RN [phone]

## 2015-10-27 NOTE — Progress Notes (Signed)
Cardiology Office Note   Date:  10/27/2015   ID:  Julian [name], DOB [birth year], MRN [id number]  PCP:  [name], MD  Cardiologist:   Acie Fredrickson Wonda Cheng, MD   Chief Complaint  Patient presents with  . Follow-up     HTN   1. Hypertension  History of Present Illness:  Julian [name] is an 82 yo , hx of HTN. Works out regularly. Does the elliptical without any difficulty. Has had some mild episodes of faint feeling. Occurred during Exercising. Occasionally has orthostatic hypotension. A month ago. He was driving and felt a bit faint. Thought that he might need to pull over to the side of the road. Drinks water while exercising,  Episodes are not always related to exercise  6-7 years ago he had an episode of very high [name]. He was started on [name] meds at that time. Resolved after several hours,   Non smoker ETOH 2 drinks a day Fhx: +, mother died at 69, father died age 1 , sister died of complications related to alzheimers. , brother had valve replacement. Retired from [employer].  Moved from [place], [place] several years ago. [place].    10/23/2014: Julian [name] is a 82 y.o. male who presents for follow-up for his high blood pressure.  we decreased the amlodipine due to some orthostatic hypotension. [name] has been ok at home.   Eats [name] most of the time.  Occasionally gets a bit of extra salt.   Still very active.    April 22, 2015:  still having some orthostatic hypotension.  [name] is normal at  Has had some occasional palpitations  - also sees it registers on his [name] cuff. Gives an error message .  His amlodipine dose was reduced about a year ago .  Still has episodes of dizziness - only lasts about 2-3 seconds  These episodes can occur while seated - and even had occurred while driving  Do not occur when he has been exercising .  Does the elliptical machine for 40 minutes without any CP or dyspnea, no dizziness  Then works out with  [employer].   07/09/2015:  Julian [name].   He had some orthostatic hypotension symptoms ,  We had stopped his amlodipine.  [name] increased slightly .   We started back Amlodipine at 2. 5 mg a day  He brought his blood pressure log with him today. Most of his readings are mildly to moderately elevated.  October 27, 2015  Julian [name].  Brought his [name] log. Most of his readings are normal .  No CP or dyspnea.   No dizziness since we  Adjusted his amlodipine therapy Is through with his XRT therapy   Past Medical History  Diagnosis Date  . Hyperlipidemia   . Hypertension   . Anemia   . Prostate cancer [facility])     Past Surgical History  Procedure Laterality Date  . Prostatectomy  2001  . Hernia repair      MID 80'S     Current Outpatient Prescriptions  Medication Sig Dispense Refill  . alendronate (FOSAMAX) 70 MG tablet Take 70 mg by mouth once a week. Take with a full glass of water on an empty stomach.    [name] amLODipine (NORVASC) 2.5 MG tablet Take 1 tablet (2.5 mg total) by mouth daily. 180 tablet 3  . atorvastatin (LIPITOR) 40 MG tablet Take 1 tablet (40 mg total) by  mouth daily. 90 tablet 3  . Cyanocobalamin (B-12) 5000 MCG CAPS Take 1 tablet by mouth daily.    [name] Fish Oil-Cholecalciferol (FISH OIL + D3 PO) Take 1,400 mg by mouth daily.    . fluticasone (CUTIVATE) 0.05 % cream Apply 1 application topically 2 (two) times daily.    . fluticasone (FLONASE) 50 MCG/ACT nasal spray Place 2 sprays into both nostrils as needed for allergies or rhinitis.    [name] losartan (COZAAR) 100 MG tablet Take 1 tablet (100 mg total) by mouth daily. 90 tablet 3  . Lutein-Zeaxanthin 25-5 MG CAPS Take by mouth daily.    . Multiple Vitamin (MULTIVITAMIN) tablet Take 1 tablet by mouth daily. CENTRUM SILVER     No current facility-administered medications for this visit.    Allergies:   Review of patient's allergies indicates no known allergies.    Social History:  The  patient  reports that he has quit smoking. He does not have any smokeless tobacco history on file. He reports that he drinks alcohol. He reports that he does not use illicit drugs.   Family History:  The patient's family history includes Alzheimer's disease in his brother; CAD in his father and mother; Dementia in his sister; Heart attack in his sister.    ROS:  Please see the history of present illness.    Review of Systems: Constitutional:  denies fever, chills, diaphoresis, appetite change and fatigue.  HEENT: denies photophobia, eye pain, redness, hearing loss, ear pain, congestion, sore throat, rhinorrhea, sneezing, neck pain, neck stiffness and tinnitus.  Respiratory: denies SOB, DOE, cough, chest tightness, and wheezing.  Cardiovascular: denies chest pain, palpitations and leg swelling.  Gastrointestinal: denies nausea, vomiting, abdominal pain, diarrhea, constipation, blood in stool.  Genitourinary: denies dysuria, urgency, frequency, hematuria, flank pain and difficulty urinating.  Musculoskeletal: denies  myalgias, back pain, joint swelling, arthralgias and gait problem.   Skin: denies pallor, rash and wound.  Neurological: denies dizziness, seizures, syncope, weakness, light-headedness, numbness and headaches.   Hematological: denies adenopathy, easy bruising, personal or family bleeding history.  Psychiatric/ Behavioral: denies suicidal ideation, mood changes, confusion, nervousness, sleep disturbance and agitation.       All other systems are reviewed and negative.    PHYSICAL EXAM: VS:  [name] 140/92 mmHg  Pulse 60  Ht 5\' 10"  (1.778 m)  Wt 166 lb (75.297 kg)  BMI 23.82 kg/m2 , BMI Body mass index is 23.82 kg/(m^2). GEN: [name] nourished, [name] developed, in no acute distress HEENT: normal Neck: no JVD, carotid bruits, or masses Cardiac: RRR; A999333 systolic murmur , rubs, or gallops,no edema  Respiratory:  clear to auscultation bilaterally, normal work of breathing GI:  soft, nontender, nondistended, + BS MS: no deformity or atrophy Skin: warm and dry, no rash Neuro:  Strength and sensation are intact Psych: normal   EKG:  EKG is not ordered today.    Recent Labs: 07/09/2015: ALT 26; BUN 19; Creat 0.94; Hemoglobin 12.9*; Magnesium 2.0; Platelets 277; Potassium 4.3; Sodium 138; TSH 6.009*    Lipid Panel    Component Value Date/Time   CHOL 127 07/09/2015 0915   TRIG 109 07/09/2015 0915   HDL 49 07/09/2015 0915   CHOLHDL 2.6 07/09/2015 0915   VLDL 22 07/09/2015 0915   LDLCALC 56 07/09/2015 0915      Wt Readings from Last 3 Encounters:  10/27/15 166 lb (75.297 kg)  07/09/15 165 lb 6.4 oz (75.025 kg)  04/22/15 164 lb 12.8 oz (74.753 kg)  ECG:  .  NSR with PACs.  RBBB.   Other studies Reviewed: Additional studies/ records that were reviewed today include:  Review of the above records demonstrates:    ASSESSMENT AND PLAN:  1.  Essential hypertension:-   He seems to be doing quite a bit better. His blood pressure is much better today. We'll check labs today including a basic metabolic profile, magnesium level, liver enzymes, fasting lipids, and CBC. Still exercising regularly .   2. Aortic sclerosis: He has a soft systolic murmur. Echocardiogram performed in January, 2016  shows calcification of his aortic valve. There is no significant stenosis. He also has mild tricuspid regurgitation.  3. Tricuspid regurgitation: Benign. We'll continue to follow.  4. Hyperlipidemia: Patient has been on on atorvastatin 40 mg a day. We'll check labs today.  5. Carotid artery disease:   He has mild carotid artery disease.    No bruits heard today . Will follow up as needed.   Current medicines are reviewed at length with the patient today.  The patient does not have concerns regarding medicines   Disposition:   FU with me in 1 year       Signed, Carnell Casamento, Wonda Cheng, MD  10/27/2015 2:50 PM    [facility] [address],  [place], [place]  [address] Phone: [phone]; Fax: [phone]

## 2015-12-25 DIAGNOSIS — D225 Melanocytic nevi of trunk: Secondary | ICD-10-CM | POA: Diagnosis not present

## 2015-12-25 DIAGNOSIS — C44722 Squamous cell carcinoma of skin of right lower limb, including hip: Secondary | ICD-10-CM | POA: Diagnosis not present

## 2015-12-25 DIAGNOSIS — L218 Other seborrheic dermatitis: Secondary | ICD-10-CM | POA: Diagnosis not present

## 2015-12-25 DIAGNOSIS — L812 Freckles: Secondary | ICD-10-CM | POA: Diagnosis not present

## 2015-12-25 DIAGNOSIS — D485 Neoplasm of uncertain behavior of skin: Secondary | ICD-10-CM | POA: Diagnosis not present

## 2015-12-25 DIAGNOSIS — L821 Other seborrheic keratosis: Secondary | ICD-10-CM | POA: Diagnosis not present

## 2015-12-25 DIAGNOSIS — Z85828 Personal history of other malignant neoplasm of skin: Secondary | ICD-10-CM | POA: Diagnosis not present

## 2015-12-25 DIAGNOSIS — D1801 Hemangioma of skin and subcutaneous tissue: Secondary | ICD-10-CM | POA: Diagnosis not present

## 2015-12-25 DIAGNOSIS — L570 Actinic keratosis: Secondary | ICD-10-CM | POA: Diagnosis not present

## 2015-12-25 DIAGNOSIS — L720 Epidermal cyst: Secondary | ICD-10-CM | POA: Diagnosis not present

## 2016-01-29 DIAGNOSIS — C44722 Squamous cell carcinoma of skin of right lower limb, including hip: Secondary | ICD-10-CM | POA: Diagnosis not present

## 2016-02-10 ENCOUNTER — Other Ambulatory Visit: Payer: Self-pay | Admitting: Cardiovascular Disease

## 2016-03-03 ENCOUNTER — Other Ambulatory Visit: Payer: Self-pay | Admitting: Cardiovascular Disease

## 2016-03-12 DIAGNOSIS — C884 Extranodal marginal zone B-cell lymphoma of mucosa-associated lymphoid tissue [MALT-lymphoma]: Secondary | ICD-10-CM | POA: Diagnosis not present

## 2016-03-12 DIAGNOSIS — Z79899 Other long term (current) drug therapy: Secondary | ICD-10-CM | POA: Diagnosis not present

## 2016-03-12 DIAGNOSIS — Z923 Personal history of irradiation: Secondary | ICD-10-CM | POA: Diagnosis not present

## 2016-03-12 DIAGNOSIS — I10 Essential (primary) hypertension: Secondary | ICD-10-CM | POA: Diagnosis not present

## 2016-03-30 DIAGNOSIS — I10 Essential (primary) hypertension: Secondary | ICD-10-CM | POA: Diagnosis not present

## 2016-03-30 DIAGNOSIS — H01025 Squamous blepharitis left lower eyelid: Secondary | ICD-10-CM | POA: Diagnosis not present

## 2016-03-30 DIAGNOSIS — H01021 Squamous blepharitis right upper eyelid: Secondary | ICD-10-CM | POA: Diagnosis not present

## 2016-03-30 DIAGNOSIS — H43811 Vitreous degeneration, right eye: Secondary | ICD-10-CM | POA: Diagnosis not present

## 2016-03-30 DIAGNOSIS — H119 Unspecified disorder of conjunctiva: Secondary | ICD-10-CM | POA: Diagnosis not present

## 2016-03-30 DIAGNOSIS — H01009 Unspecified blepharitis unspecified eye, unspecified eyelid: Secondary | ICD-10-CM | POA: Diagnosis not present

## 2016-03-30 DIAGNOSIS — H01022 Squamous blepharitis right lower eyelid: Secondary | ICD-10-CM | POA: Diagnosis not present

## 2016-03-30 DIAGNOSIS — Z961 Presence of intraocular lens: Secondary | ICD-10-CM | POA: Diagnosis not present

## 2016-03-30 DIAGNOSIS — H0289 Other specified disorders of eyelid: Secondary | ICD-10-CM | POA: Diagnosis not present

## 2016-03-30 DIAGNOSIS — Z923 Personal history of irradiation: Secondary | ICD-10-CM | POA: Diagnosis not present

## 2016-03-30 DIAGNOSIS — Z87891 Personal history of nicotine dependence: Secondary | ICD-10-CM | POA: Diagnosis not present

## 2016-03-30 DIAGNOSIS — C884 Extranodal marginal zone B-cell lymphoma of mucosa-associated lymphoid tissue [MALT-lymphoma]: Secondary | ICD-10-CM | POA: Diagnosis not present

## 2016-03-30 DIAGNOSIS — H01024 Squamous blepharitis left upper eyelid: Secondary | ICD-10-CM | POA: Diagnosis not present

## 2016-04-16 DIAGNOSIS — C884 Extranodal marginal zone B-cell lymphoma of mucosa-associated lymphoid tissue [MALT-lymphoma]: Secondary | ICD-10-CM | POA: Diagnosis not present

## 2016-04-20 DIAGNOSIS — Z23 Encounter for immunization: Secondary | ICD-10-CM | POA: Diagnosis not present

## 2016-04-27 DIAGNOSIS — R918 Other nonspecific abnormal finding of lung field: Secondary | ICD-10-CM | POA: Diagnosis not present

## 2016-04-27 DIAGNOSIS — C884 Extranodal marginal zone B-cell lymphoma of mucosa-associated lymphoid tissue [MALT-lymphoma]: Secondary | ICD-10-CM | POA: Diagnosis not present

## 2016-04-28 DIAGNOSIS — L239 Allergic contact dermatitis, unspecified cause: Secondary | ICD-10-CM | POA: Diagnosis not present

## 2016-04-30 DIAGNOSIS — C884 Extranodal marginal zone B-cell lymphoma of mucosa-associated lymphoid tissue [MALT-lymphoma]: Secondary | ICD-10-CM | POA: Diagnosis not present

## 2016-04-30 DIAGNOSIS — I10 Essential (primary) hypertension: Secondary | ICD-10-CM | POA: Diagnosis not present

## 2016-05-03 DIAGNOSIS — Z923 Personal history of irradiation: Secondary | ICD-10-CM | POA: Diagnosis not present

## 2016-05-03 DIAGNOSIS — C884 Extranodal marginal zone B-cell lymphoma of mucosa-associated lymphoid tissue [MALT-lymphoma]: Secondary | ICD-10-CM | POA: Diagnosis not present

## 2016-05-05 DIAGNOSIS — C7949 Secondary malignant neoplasm of other parts of nervous system: Secondary | ICD-10-CM | POA: Diagnosis not present

## 2016-05-05 DIAGNOSIS — Z01818 Encounter for other preprocedural examination: Secondary | ICD-10-CM | POA: Diagnosis not present

## 2016-05-05 DIAGNOSIS — C884 Extranodal marginal zone B-cell lymphoma of mucosa-associated lymphoid tissue [MALT-lymphoma]: Secondary | ICD-10-CM | POA: Diagnosis not present

## 2016-05-12 DIAGNOSIS — L249 Irritant contact dermatitis, unspecified cause: Secondary | ICD-10-CM | POA: Diagnosis not present

## 2016-05-13 DIAGNOSIS — C884 Extranodal marginal zone B-cell lymphoma of mucosa-associated lymphoid tissue [MALT-lymphoma]: Secondary | ICD-10-CM | POA: Diagnosis not present

## 2016-05-13 DIAGNOSIS — Z510 Encounter for antineoplastic radiation therapy: Secondary | ICD-10-CM | POA: Diagnosis not present

## 2016-05-13 DIAGNOSIS — C7949 Secondary malignant neoplasm of other parts of nervous system: Secondary | ICD-10-CM | POA: Diagnosis not present

## 2016-05-14 DIAGNOSIS — Z510 Encounter for antineoplastic radiation therapy: Secondary | ICD-10-CM | POA: Diagnosis not present

## 2016-05-14 DIAGNOSIS — C884 Extranodal marginal zone B-cell lymphoma of mucosa-associated lymphoid tissue [MALT-lymphoma]: Secondary | ICD-10-CM | POA: Diagnosis not present

## 2016-05-25 DIAGNOSIS — D485 Neoplasm of uncertain behavior of skin: Secondary | ICD-10-CM | POA: Diagnosis not present

## 2016-05-25 DIAGNOSIS — L923 Foreign body granuloma of the skin and subcutaneous tissue: Secondary | ICD-10-CM | POA: Diagnosis not present

## 2016-05-25 DIAGNOSIS — C44722 Squamous cell carcinoma of skin of right lower limb, including hip: Secondary | ICD-10-CM | POA: Diagnosis not present

## 2016-05-25 DIAGNOSIS — L570 Actinic keratosis: Secondary | ICD-10-CM | POA: Diagnosis not present

## 2016-05-28 DIAGNOSIS — Z79899 Other long term (current) drug therapy: Secondary | ICD-10-CM | POA: Diagnosis not present

## 2016-05-28 DIAGNOSIS — I10 Essential (primary) hypertension: Secondary | ICD-10-CM | POA: Diagnosis not present

## 2016-05-28 DIAGNOSIS — C884 Extranodal marginal zone B-cell lymphoma of mucosa-associated lymphoid tissue [MALT-lymphoma]: Secondary | ICD-10-CM | POA: Diagnosis not present

## 2016-06-03 DIAGNOSIS — Z87891 Personal history of nicotine dependence: Secondary | ICD-10-CM | POA: Diagnosis not present

## 2016-06-03 DIAGNOSIS — H0289 Other specified disorders of eyelid: Secondary | ICD-10-CM | POA: Diagnosis not present

## 2016-06-03 DIAGNOSIS — H01021 Squamous blepharitis right upper eyelid: Secondary | ICD-10-CM | POA: Diagnosis not present

## 2016-06-03 DIAGNOSIS — Z79899 Other long term (current) drug therapy: Secondary | ICD-10-CM | POA: Diagnosis not present

## 2016-06-03 DIAGNOSIS — H01022 Squamous blepharitis right lower eyelid: Secondary | ICD-10-CM | POA: Diagnosis not present

## 2016-06-03 DIAGNOSIS — H01025 Squamous blepharitis left lower eyelid: Secondary | ICD-10-CM | POA: Diagnosis not present

## 2016-06-03 DIAGNOSIS — C884 Extranodal marginal zone B-cell lymphoma of mucosa-associated lymphoid tissue [MALT-lymphoma]: Secondary | ICD-10-CM | POA: Diagnosis not present

## 2016-06-03 DIAGNOSIS — I10 Essential (primary) hypertension: Secondary | ICD-10-CM | POA: Diagnosis not present

## 2016-06-03 DIAGNOSIS — H01024 Squamous blepharitis left upper eyelid: Secondary | ICD-10-CM | POA: Diagnosis not present

## 2016-06-03 DIAGNOSIS — Z48810 Encounter for surgical aftercare following surgery on the sense organs: Secondary | ICD-10-CM | POA: Diagnosis not present

## 2016-06-05 ENCOUNTER — Telehealth: Payer: Self-pay | Admitting: Physician Assistant

## 2016-06-05 MED ORDER — AMLODIPINE BESYLATE 5 MG PO TABS: 5.0000 mg | ORAL_TABLET | Freq: Every day | ORAL | 6 refills | Status: DC

## 2016-06-05 NOTE — Telephone Encounter (Signed)
Paged by answering service. Pt blood pressure running high for the past 3 days. Always >175/92. Highest 207/99. This morning it was 184/93. He takes losartan 100mg  qd. He has Amlodipine 2.5mg  qd on list however states that it was discontinued 6 months ago however I can not find any documentation.   He is asymptomatic. The patient denies nausea, vomiting, fever, chest pain, palpitations, shortness of breath, orthopnea, PND, dizziness, syncope, cough, congestion, abdominal pain, hematochezia, melena, lower extremity edema, slurred speech or weakness of one side of body.   Rx of Amlodipine 5mg  qd stent to local pharmacy. He will give [place] a call if no improvement or becomes symptomatic. Patient and wife voice understanding.

## 2016-06-06 ENCOUNTER — Telehealth: Payer: Self-pay | Admitting: Cardiology

## 2016-06-06 NOTE — Telephone Encounter (Signed)
Patient's wife called back today, stating that she is concerned about her husbands blood pressure. States they picked up Rx for Norvasc 5mg  yesterday and took one dose yesterday. Called this morning reporting his blood pressure remains elevated with systolic in the A999333, but he has not taken his blood pressure medications this morning, normally takes in the evening. I advised that he go ahead and take his losartan 100mg  along with Norvasc 5mg  now. Recheck his blood pressure and take an additional dose of Norvasc 5mg  if systolic remains about Q000111Q.   The patient is asymptomatic at this time without nausea, vomiting, chest pain, palpitations, shortness of breath, orthopnea, PND, dizziness, syncope, lower extremity edema, slurred speech or weakness of one side of body. I informed his wife if his blood pressure does not improve or he become symptomatic to give a call back. The patient and his wife voiced understanding.

## 2016-06-07 ENCOUNTER — Telehealth: Payer: Self-pay | Admitting: Cardiovascular Disease

## 2016-06-07 NOTE — Telephone Encounter (Signed)
New Message  Pt c/o BP issue: STAT if pt c/o blurred vision, one-sided weakness or slurred speech  1. What are your last 5 BP readings? Pt wife did not have readings. Pt wife states Saturday 10/14 bp was 207/90. This morning 10/16 bp was 169/90.  2. Are you having any other symptoms (ex. Dizziness, headache, blurred vision, passed out)? Per pt wife pt feeling really weird and uncomfortable   3. What is your BP issue? Pt wife would like to speak with the RN about pt high bp. Pt wife asked to speak with RN to get pt an appt to see the Doctor today. Please call back to discuss

## 2016-06-07 NOTE — Telephone Encounter (Signed)
Spoke with patient's wife who states patient is having erratic mostly elevated BP over the past several days.  They spoke with 2 APP's over the weekend and amlodipine was increased to 5 mg daily.  Patient takes losartan 100 mg daily at night.  He states he started monitoring his BP 2 weeks ago and has noticed trends of elevated systolic BP > Q000111Q mmHg.   Today after taking amlodipine, BP  @ 0800 169/85 mmHg, pulse 63 bpm.  He states BP is generally lowest during the middle of the day.  He checks his BP multiple times throughout the day. He denies dizziness, headache, blurred vision; states he feels fine.  He admits to eating more salt recently when he got BP readings of 99991111 systolic; states he has since reduced his salt intake. I advised him to take losartan 100 mg in the morning rather than at night and to take amlodipine 5 mg at lunchtime.  I advised him to keep a BP log and to only take BP and pulse once in the morning, approximately 30 minutes after taking medication and once in the evening.  I advised I will call back on Thursday to see how he is feeling.  I advised him to call back sooner with questions or concerns.  He and his wife verbalized understanding and agreement with plan.

## 2016-06-08 ENCOUNTER — Telehealth: Payer: Self-pay | Admitting: Cardiovascular Disease

## 2016-06-08 ENCOUNTER — Other Ambulatory Visit: Payer: Self-pay | Admitting: *Deleted

## 2016-06-08 MED ORDER — AMLODIPINE BESYLATE 5 MG PO TABS: 5.0000 mg | ORAL_TABLET | Freq: Every day | ORAL | 3 refills | Status: DC

## 2016-06-08 NOTE — Telephone Encounter (Signed)
New Message  Pt c/o medication issue:  1. Name of Medication: losartan.... Amlodipine....   2. How are you currently taking this medication (dosage and times per day)? 100mg .... 5mg   3. Are you having a reaction (difficulty breathing--STAT)? Per pt wife pt is confused   4. What is your medication issue? Pt wife call requesting to speak with RN about pts medication. Pt states pt is very confused. Pt wife wanted to verfiy the correct dosage and instructing for taking. Please call back to discuss

## 2016-06-08 NOTE — Telephone Encounter (Signed)
Pt's wife called because  pt is confused and he does not take the medications as prescribed. Wife wants verify the dose and time pt is scheduled to take . I went over with pt's wife that pt is to take Losartan 100 mg once daily, take in the Am and Amlodipine 5 mg pt to take at lunch time. Pt verbalized understanding.

## 2016-06-11 DIAGNOSIS — C44722 Squamous cell carcinoma of skin of right lower limb, including hip: Secondary | ICD-10-CM | POA: Diagnosis not present

## 2016-06-11 NOTE — Telephone Encounter (Signed)
Agree with note from Christen Bame, RN. Continue to follow his BP

## 2016-06-11 NOTE — Telephone Encounter (Signed)
Spoke with patient's wife to check on how patient is feeling since making the BP medication adjustments.  She states he has had 2 episodes of dizziness this week - 1 which occurred while exercising and the other while he was trimming bushes.  She states each episode occurred around lunch time.  Patient has been taking losartan 100 mg in the morning and amlodipine 5 mg at lunchtime. I advised that patient take 1/2 losartan 100 mg (50 mg) in the morning, take the amlodipine 5 mg at lunch, and take another 50 mg losartan in the evening.  She states he already took losartan 100 mg this morning.  I advised her to have him hold amlodipine today.  She states she will advise him to start the new regimen tomorrow.  I scheduled him to see Dr. Acie Fredrickson on Friday 10/27.  They are planning a trip to [place] on 11/8 and want to make certain his BP is under control prior to travel.  I advised her to call back with questions or concerns prior to office visit.  She verbalized understanding and agreement with plan and thanked me for the call.

## 2016-06-17 ENCOUNTER — Encounter: Payer: Self-pay | Admitting: Cardiovascular Disease

## 2016-06-18 ENCOUNTER — Ambulatory Visit (INDEPENDENT_AMBULATORY_CARE_PROVIDER_SITE_OTHER): Payer: Medicare Other | Admitting: Cardiovascular Disease

## 2016-06-18 ENCOUNTER — Encounter: Payer: Self-pay | Admitting: Cardiovascular Disease

## 2016-06-18 VITALS — BP 92/44 | HR 73 | Ht 70.0 in | Wt 156.4 lb

## 2016-06-18 DIAGNOSIS — I6523 Occlusion and stenosis of bilateral carotid arteries: Secondary | ICD-10-CM | POA: Diagnosis not present

## 2016-06-18 DIAGNOSIS — I10 Essential (primary) hypertension: Secondary | ICD-10-CM | POA: Diagnosis not present

## 2016-06-18 NOTE — Patient Instructions (Signed)

## 2016-06-18 NOTE — Progress Notes (Signed)
Cardiology Office Note   Date:  06/18/2016   ID:  Julian [name], DOB 11/18/1925, MRN [id number]  PCP:  [name], MD  Cardiologist:   [name], MD   Chief Complaint  Patient presents with  . Follow-up    HTN    1. Hypertension    Julian [name] is an 82 yo , hx of HTN. Works out regularly. Does the elliptical without any difficulty. Has had some mild episodes of faint feeling. Occurred during Exercising. Occasionally has orthostatic hypotension. A month ago. He was driving and felt a bit faint. Thought that he might need to pull over to the side of the road. Drinks water while exercising,  Episodes are not always related to exercise  6-7 years ago he had an episode of very high BP. He was started on BP meds at that time. Resolved after several hours,   Non smoker ETOH 2 drinks a day Fhx: +, mother died at 78, father died age 63 , sister died of complications related to alzheimers. , brother had valve replacement. Retired from [employer].  Moved from [place], [place] several years ago. [place].    10/23/2014: Julian [name] is a 82 y.o. male who presents for follow-up for his high blood pressure.  we decreased the amlodipine due to some orthostatic hypotension. BP has been ok at home.   Eats well most of the time.  Occasionally gets a bit of extra salt.   Still very active.    April 22, 2015:  still having some orthostatic hypotension.  BP is normal at  Has had some occasional palpitations  - also sees it registers on his BP cuff. Gives an error message .  His amlodipine dose was reduced about a year ago .  Still has episodes of dizziness - only lasts about 2-3 seconds  These episodes can occur while seated - and even had occurred while driving  Do not occur when he has been exercising .  Does the elliptical machine for 40 minutes without any CP or dyspnea, no dizziness  Then works out with [employer].   07/09/2015:  Mr.  [name] is seen today for variable BP.   He had some orthostatic hypotension symptoms ,  We had stopped his amlodipine.  BP increased slightly .   We started back Amlodipine at 2. 5 mg a day  He brought his blood pressure log with him today. Most of his readings are mildly to moderately elevated.  October 27, 2015  Julian [name] is doing well.  Brought his BP log. Most of his readings are normal .  No CP or dyspnea.   No dizziness since we  Adjusted his amlodipine therapy Is through with his XRT therapy   Oct. 27, 2017:  Julian [name] is seen for follow up visit . Has been having some BP variability and some dizzy spells.  He has held his Amlodipine in the past ( early 2016) but we then restarted it at 2.5 mg a day for some high BP readings.   I have reviewed his home BP log. He has had some BP variability ,   Occasional BP of 140s and 150s.   Leaving for [place] on Nov. 8.   ( [name] for 5 days, then a cruise)   He was sick yesterday and had diarrhea all day - did not eat much . He has had some dizziness this am ( and BP is found to be 92/44 today in the office )  Feeling better now.    Past Medical History:  Diagnosis Date  . Anemia   . Hyperlipidemia   . Hypertension   . Prostate cancer [facility])     Past Surgical History:  Procedure Laterality Date  . HERNIA REPAIR     MID 80'S  . PROSTATECTOMY  2001     Current Outpatient Prescriptions  Medication Sig Dispense Refill  . alendronate (FOSAMAX) 70 MG tablet Take 70 mg by mouth once a week. Take with a full glass of water on an empty stomach.    [name] amLODipine (NORVASC) 5 MG tablet Take 1 tablet (5 mg total) by mouth daily. 90 tablet 3  . atorvastatin (LIPITOR) 40 MG tablet Take 1 tablet by mouth  daily 90 tablet 2  . Cyanocobalamin (B-12) 5000 MCG CAPS Take 1 tablet by mouth daily.    [name] Fish Oil-Cholecalciferol (FISH OIL + D3 PO) Take 1,400 mg by mouth daily.    . fluticasone (CUTIVATE) 0.05 % cream Apply 1 application topically 2 (two)  times daily.    . fluticasone (FLONASE) 50 MCG/ACT nasal spray Place 2 sprays into both nostrils as needed for allergies or rhinitis.    [name] losartan (COZAAR) 100 MG tablet Take 1 tablet by mouth  daily 90 tablet 2  . Lutein-Zeaxanthin 25-5 MG CAPS Take 1 tablet by mouth daily.     . Multiple Vitamin (MULTIVITAMIN) tablet Take 1 tablet by mouth daily. CENTRUM SILVER     No current facility-administered medications for this visit.     Allergies:   Review of patient's allergies indicates no known allergies.    Social History:  The patient  reports that he has quit smoking. He has never used smokeless tobacco. He reports that he drinks alcohol. He reports that he does not use drugs.   Family History:  The patient's family history includes Alzheimer's disease in his brother; CAD in his father and mother; Dementia in his sister; Heart attack in his sister.    ROS:  Please see the history of present illness.    Review of Systems: Constitutional:  denies fever, chills, diaphoresis, appetite change and fatigue.  HEENT: denies photophobia, eye pain, redness, hearing loss, ear pain, congestion, sore throat, rhinorrhea, sneezing, neck pain, neck stiffness and tinnitus.  Respiratory: denies SOB, DOE, cough, chest tightness, and wheezing.  Cardiovascular: denies chest pain, palpitations and leg swelling.  Gastrointestinal: denies nausea, vomiting, abdominal pain, diarrhea, constipation, blood in stool.  Genitourinary: denies dysuria, urgency, frequency, hematuria, flank pain and difficulty urinating.  Musculoskeletal: denies  myalgias, back pain, joint swelling, arthralgias and gait problem.   Skin: denies pallor, rash and wound.  Neurological: denies dizziness, seizures, syncope, weakness, light-headedness, numbness and headaches.   Hematological: denies adenopathy, easy bruising, personal or family bleeding history.  Psychiatric/ Behavioral: denies suicidal ideation, mood changes, confusion,  nervousness, sleep disturbance and agitation.       All other systems are reviewed and negative.    PHYSICAL EXAM: VS:  BP (!) 92/44 (BP Location: Left Arm, Patient Position: Sitting, Cuff Size: Normal)   Pulse 73   Ht 5\' 10"  (1.778 m)   Wt 156 lb 6.4 oz (70.9 kg)   BMI 22.44 kg/m  , BMI Body mass index is 22.44 kg/m. GEN: Well nourished, well developed, in no acute distress  HEENT: normal  Neck: no JVD, carotid bruits, or masses Cardiac: RRR; A999333 systolic murmur , rubs, or gallops,no edema  Respiratory:  clear to auscultation bilaterally, normal work of breathing  GI: soft, nontender, nondistended, + BS MS: no deformity or atrophy  Skin: warm and dry, no rash Neuro:  Strength and sensation are intact Psych: normal   EKG:  EKG is ordered today.   NSR at 73.   RBBB   Recent Labs: 07/09/2015: ALT 26; BUN 19; Creat 0.94; Hemoglobin 12.9; Magnesium 2.0; Platelets 277; Potassium 4.3; Sodium 138; TSH 6.009    Lipid Panel    Component Value Date/Time   CHOL 127 07/09/2015 0915   TRIG 109 07/09/2015 0915   HDL 49 07/09/2015 0915   CHOLHDL 2.6 07/09/2015 0915   VLDL 22 07/09/2015 0915   LDLCALC 56 07/09/2015 0915      Wt Readings from Last 3 Encounters:  06/18/16 156 lb 6.4 oz (70.9 kg)  10/27/15 166 lb (75.3 kg)  07/09/15 165 lb 6.4 oz (75 kg)    ECG:  .  NSR with PACs.  RBBB.   Other studies Reviewed: Additional studies/ records that were reviewed today include:  Review of the above records demonstrates:    ASSESSMENT AND PLAN:  1.  Essential hypertension:-   He seems to be doing quite a bit better. His blood pressure is much better today.  His blood pressure readings overall or not too bad. He's had some mild elevations. His blood pressure earlier today was low but it should be noted that he had diarrhea all day yesterday and did not have much to eat.  I think that he should continue with his same medications. If his blood pressure falls or if he should  develop dizziness I have instructed him to decrease the amlodipine to 2.5 mg a day. I will see him again in 6 months for follow-up visit..  2. Aortic sclerosis: He has a soft systolic murmur. Echocardiogram performed in January, 2016  shows calcification of his aortic valve. There is no significant stenosis. He also has mild tricuspid regurgitation.  3. Tricuspid regurgitation: Benign. We'll continue to follow.  4. Hyperlipidemia: Patient has been on on atorvastatin 40 mg a day.    5. Carotid artery disease:   He has mild carotid artery disease.    No bruits heard today . Will follow up as needed.   Current medicines are reviewed at length with the patient today.  The patient does not have concerns regarding medicines   Disposition:   FU with me in 1 year       Signed, [name], MD  06/18/2016 9:49 AM    [address], [place], [place]  [address] Phone: [phone]; Fax: [phone]

## 2016-06-28 DIAGNOSIS — H1189 Other specified disorders of conjunctiva: Secondary | ICD-10-CM | POA: Diagnosis not present

## 2016-06-28 DIAGNOSIS — C884 Extranodal marginal zone B-cell lymphoma of mucosa-associated lymphoid tissue [MALT-lymphoma]: Secondary | ICD-10-CM | POA: Diagnosis not present

## 2016-07-19 DIAGNOSIS — Z6822 Body mass index (BMI) 22.0-22.9, adult: Secondary | ICD-10-CM | POA: Diagnosis not present

## 2016-07-19 DIAGNOSIS — R05 Cough: Secondary | ICD-10-CM | POA: Diagnosis not present

## 2016-07-19 DIAGNOSIS — J209 Acute bronchitis, unspecified: Secondary | ICD-10-CM | POA: Diagnosis not present

## 2016-07-20 DIAGNOSIS — L0390 Cellulitis, unspecified: Secondary | ICD-10-CM | POA: Diagnosis not present

## 2016-07-28 DIAGNOSIS — H43812 Vitreous degeneration, left eye: Secondary | ICD-10-CM | POA: Diagnosis not present

## 2016-09-01 DIAGNOSIS — H43812 Vitreous degeneration, left eye: Secondary | ICD-10-CM | POA: Diagnosis not present

## 2016-09-02 DIAGNOSIS — H01024 Squamous blepharitis left upper eyelid: Secondary | ICD-10-CM | POA: Diagnosis not present

## 2016-09-02 DIAGNOSIS — C884 Extranodal marginal zone B-cell lymphoma of mucosa-associated lymphoid tissue [MALT-lymphoma]: Secondary | ICD-10-CM | POA: Diagnosis not present

## 2016-09-02 DIAGNOSIS — Z79899 Other long term (current) drug therapy: Secondary | ICD-10-CM | POA: Diagnosis not present

## 2016-09-02 DIAGNOSIS — I10 Essential (primary) hypertension: Secondary | ICD-10-CM | POA: Diagnosis not present

## 2016-09-02 DIAGNOSIS — H01025 Squamous blepharitis left lower eyelid: Secondary | ICD-10-CM | POA: Diagnosis not present

## 2016-09-02 DIAGNOSIS — Z923 Personal history of irradiation: Secondary | ICD-10-CM | POA: Diagnosis not present

## 2016-09-02 DIAGNOSIS — H01022 Squamous blepharitis right lower eyelid: Secondary | ICD-10-CM | POA: Diagnosis not present

## 2016-09-02 DIAGNOSIS — Z87891 Personal history of nicotine dependence: Secondary | ICD-10-CM | POA: Diagnosis not present

## 2016-09-02 DIAGNOSIS — H01021 Squamous blepharitis right upper eyelid: Secondary | ICD-10-CM | POA: Diagnosis not present

## 2016-09-10 DIAGNOSIS — C884 Extranodal marginal zone B-cell lymphoma of mucosa-associated lymphoid tissue [MALT-lymphoma]: Secondary | ICD-10-CM | POA: Diagnosis not present

## 2016-09-10 DIAGNOSIS — Z79899 Other long term (current) drug therapy: Secondary | ICD-10-CM | POA: Diagnosis not present

## 2016-09-10 DIAGNOSIS — I10 Essential (primary) hypertension: Secondary | ICD-10-CM | POA: Diagnosis not present

## 2016-09-13 ENCOUNTER — Other Ambulatory Visit: Payer: Self-pay | Admitting: Cardiovascular Disease

## 2016-09-14 ENCOUNTER — Encounter: Payer: Self-pay | Admitting: Cardiovascular Disease

## 2016-09-14 DIAGNOSIS — Z125 Encounter for screening for malignant neoplasm of prostate: Secondary | ICD-10-CM | POA: Diagnosis not present

## 2016-09-14 DIAGNOSIS — R8299 Other abnormal findings in urine: Secondary | ICD-10-CM | POA: Diagnosis not present

## 2016-09-14 DIAGNOSIS — M810 Age-related osteoporosis without current pathological fracture: Secondary | ICD-10-CM | POA: Diagnosis not present

## 2016-09-14 DIAGNOSIS — I10 Essential (primary) hypertension: Secondary | ICD-10-CM | POA: Diagnosis not present

## 2016-09-14 DIAGNOSIS — E784 Other hyperlipidemia: Secondary | ICD-10-CM | POA: Diagnosis not present

## 2016-09-21 ENCOUNTER — Telehealth: Payer: Self-pay | Admitting: Cardiovascular Disease

## 2016-09-21 DIAGNOSIS — R42 Dizziness and giddiness: Secondary | ICD-10-CM | POA: Diagnosis not present

## 2016-09-21 DIAGNOSIS — C4339 Malignant melanoma of other parts of face: Secondary | ICD-10-CM | POA: Diagnosis not present

## 2016-09-21 DIAGNOSIS — I10 Essential (primary) hypertension: Secondary | ICD-10-CM | POA: Diagnosis not present

## 2016-09-21 DIAGNOSIS — Z0000 Encounter for general adult medical examination without abnormal findings: Secondary | ICD-10-CM | POA: Diagnosis not present

## 2016-09-21 DIAGNOSIS — M810 Age-related osteoporosis without current pathological fracture: Secondary | ICD-10-CM | POA: Diagnosis not present

## 2016-09-21 DIAGNOSIS — Z8546 Personal history of malignant neoplasm of prostate: Secondary | ICD-10-CM | POA: Diagnosis not present

## 2016-09-21 DIAGNOSIS — Z1389 Encounter for screening for other disorder: Secondary | ICD-10-CM | POA: Diagnosis not present

## 2016-09-21 DIAGNOSIS — Z6823 Body mass index (BMI) 23.0-23.9, adult: Secondary | ICD-10-CM | POA: Diagnosis not present

## 2016-09-21 DIAGNOSIS — E038 Other specified hypothyroidism: Secondary | ICD-10-CM | POA: Diagnosis not present

## 2016-09-21 DIAGNOSIS — E784 Other hyperlipidemia: Secondary | ICD-10-CM | POA: Diagnosis not present

## 2016-09-21 NOTE — Telephone Encounter (Signed)
Spoke with patient's wife who states patient was at [facility] office, Dr. Brigitte Pulse, and had elevated BP even after resting for awhile. She states Dr. Brigitte Pulse advised they call our office. She states he has been getting similar readings at home recently. She states he has occasional dizziness but denies headache or blurred vision. She states the patient is no longer taking amlodipine; she states he stopped it due to hypotension and Dr. Brigitte Pulse told him there are better medications. She denies patient has had any episodes of low blood pressure. I advised that I will route to Dr. Acie Fredrickson for advice. She asked me about upcoming appointments and I advised patient is due for follow-up in April. I advised that we will find out what Dr. Acie Fredrickson recommends for his BP and then have patient monitor and schedule appointment based on his progress. She verbalized understanding and agreement.

## 2016-09-21 NOTE — Telephone Encounter (Signed)
I would like to see him to make the decision. I can work him in at 8:45 on Thursday

## 2016-09-21 NOTE — Telephone Encounter (Signed)
Spoke with patient's wife and scheduled patient for appointment with Dr. Acie Fredrickson on Thursday 2/1 at 0900. I advised her to bring their BP cuff and log with them to the appointment. She verbalized understanding and agreement and thanked me for the call.

## 2016-09-21 NOTE — Telephone Encounter (Signed)
New Message   Pt c/o BP issue: STAT if pt c/o blurred vision, one-sided weakness or slurred speech  1. What are your last 5 BP readings? 190/80  190/71 2. Are you having any other symptoms (ex. Dizziness, headache, blurred vision, passed out)? Wife believes he is having dizzy spells 3. What is your BP issue? Recently at PCP office and BP was elevated at appt and didn't come down.   Wife states that 2 weeks ago, PT had an episode of not being able to get words out which lasted for 3 days.

## 2016-09-23 ENCOUNTER — Ambulatory Visit (INDEPENDENT_AMBULATORY_CARE_PROVIDER_SITE_OTHER): Payer: Medicare Other | Admitting: Cardiovascular Disease

## 2016-09-23 ENCOUNTER — Encounter: Payer: Self-pay | Admitting: Cardiovascular Disease

## 2016-09-23 VITALS — BP 132/62 | HR 60 | Ht 70.0 in | Wt 159.0 lb

## 2016-09-23 DIAGNOSIS — I739 Peripheral vascular disease, unspecified: Secondary | ICD-10-CM

## 2016-09-23 DIAGNOSIS — G459 Transient cerebral ischemic attack, unspecified: Secondary | ICD-10-CM

## 2016-09-23 DIAGNOSIS — I779 Disorder of arteries and arterioles, unspecified: Secondary | ICD-10-CM

## 2016-09-23 DIAGNOSIS — I10 Essential (primary) hypertension: Secondary | ICD-10-CM | POA: Diagnosis not present

## 2016-09-23 MED ORDER — LOSARTAN POTASSIUM 50 MG PO TABS: 50.0000 mg | ORAL_TABLET | Freq: Every day | ORAL | 3 refills | Status: DC

## 2016-09-23 NOTE — Patient Instructions (Addendum)
Medication Instructions:  TAKE Losartan (Cozaar) to 50 mg twice daily   Labwork: Your physician recommends that you return for lab work in: 3 weeks for basic metabolic panel   Testing/Procedures: Your physician has requested that you have an echocardiogram. Echocardiography is a painless test that uses sound waves to create images of your heart. It provides your doctor with information about the size and shape of your heart and how well your heart's chambers and valves are working. This procedure takes approximately one hour. There are no restrictions for this procedure.  Your physician has requested that you have a carotid duplex. This test is an ultrasound of the carotid arteries in your neck. It looks at blood flow through these arteries that supply the brain with blood. Allow one hour for this exam. There are no restrictions or special instructions.   Follow-Up: You have been referred to Neurology for evaluation of TIA   Your physician wants you to follow-up in: 6 months with Dr. Vilinda Boehringer will receive a reminder letter in the mail two months in advance. If you don't receive a letter, please call our office to schedule the follow-up appointment.   If you need a refill on your cardiac medications before your next appointment, please call your pharmacy.   Thank you for choosing [facility]! Christen Bame, RN [phone]

## 2016-09-23 NOTE — Progress Notes (Signed)
Cardiology Office Note   Date:  09/23/2016   ID:  Julian [name], DOB March 02, 1926, MRN [id number]  PCP:  [name], MD  Cardiologist:   [name], MD   Chief Complaint  Patient presents with  . Hypertension   1. Hypertension    [name] is an 83 yo , hx of HTN. Works out regularly. Does the elliptical without any difficulty. Has had some mild episodes of faint feeling. Occurred during Exercising. Occasionally has orthostatic hypotension. A month ago. He was driving and felt a bit faint. Thought that he might need to pull over to the side of the road. Drinks water while exercising,  Episodes are not always related to exercise  6-7 years ago he had an episode of very high BP. He was started on BP meds at that time. Resolved after several hours,   Non smoker ETOH 2 drinks a day Fhx: +, mother died at 69, father died age 58 , sister died of complications related to alzheimers. , brother had valve replacement. Retired from [employer].  Moved from [place], [place] several years ago. [place].    10/23/2014: Julian [name] is a 83 y.o. male who presents for follow-up for his high blood pressure.  we decreased the amlodipine due to some orthostatic hypotension. BP has been ok at home.   Eats well most of the time.  Occasionally gets a bit of extra salt.   Still very active.    April 22, 2015:  still having some orthostatic hypotension.  BP is normal at  Has had some occasional palpitations  - also sees it registers on his BP cuff. Gives an error message .  His amlodipine dose was reduced about a year ago .  Still has episodes of dizziness - only lasts about 2-3 seconds  These episodes can occur while seated - and even had occurred while driving  Do not occur when he has been exercising .  Does the elliptical machine for 40 minutes without any CP or dyspnea, no dizziness  Then works out with [employer].   07/09/2015:  [name] is  seen today for variable BP.   He had some orthostatic hypotension symptoms ,  We had stopped his amlodipine.  BP increased slightly .   We started back Amlodipine at 2. 5 mg a day  He brought his blood pressure log with him today. Most of his readings are mildly to moderately elevated.  October 27, 2015  [name] is doing well.  Brought his BP log. Most of his readings are normal .  No CP or dyspnea.   No dizziness since we  Adjusted his amlodipine therapy Is through with his XRT therapy   Oct. 27, 2017:  Julian [name] is seen for follow up visit . Has been having some BP variability and some dizzy spells.  He has held his Amlodipine in the past ( early 2016) but we then restarted it at 2.5 mg a day for some high BP readings.   I have reviewed his home BP log. He has had some BP variability ,   Occasional BP of 140s and 150s.   Leaving for [place] on Nov. 8.   ( [name] for 5 days, then a cruise)   He was sick yesterday and had diarrhea all day - did not eat much . He has had some dizziness this am ( and BP is found to be 92/44 today in the office )  Feeling better now.  Feb.  1, 2018:  Had a great visit to [place] several months .  Has been having some HTN issues. Took Losartan 50  mg this am ( instead of 1/2 tablet )  Had a TIA like symptom. Could not speak clearly ,   Started on ASA 81 mg that day    Past Medical History:  Diagnosis Date  . Anemia   . Hyperlipidemia   . Hypertension   . [facility]state cancer [facility])     Past Surgical History:  Procedure Laterality Date  . HERNIA REPAIR     MID 80'S  . PROSTATECTOMY  2001     Current Outpatient Prescriptions  Medication Sig Dispense Refill  . alendronate (FOSAMAX) 70 MG tablet Take 70 mg by mouth once a week. Take with a full glass of water on an empty stomach.    [name] atorvastatin (LIPITOR) 40 MG tablet TAKE 1 TABLET BY MOUTH  DAILY 90 tablet 3  . Cyanocobalamin (B-12) 5000 MCG CAPS Take 1 tablet by mouth daily.    [name] Fish  Oil-Cholecalciferol (FISH OIL + D3 PO) Take 1,400 mg by mouth daily.    . fluticasone (CUTIVATE) 0.05 % cream Apply 1 application topically 2 (two) times daily.    . fluticasone (FLONASE) 50 MCG/ACT nasal spray Place 2 sprays into both nostrils as needed for allergies or rhinitis.    [name] losartan (COZAAR) 100 MG tablet Take 1 tablet by mouth  daily 90 tablet 2  . Lutein-Zeaxanthin 25-5 MG CAPS Take 1 tablet by mouth daily.     . Multiple Vitamin (MULTIVITAMIN) tablet Take 1 tablet by mouth daily. CENTRUM SILVER     No current facility-administered medications for this visit.     Allergies:   Patient has no known allergies.    Social History:  The patient  reports that he has quit smoking. He has never used smokeless tobacco. He reports that he drinks alcohol. He reports that he does not use drugs.   Family History:  The patient's family history includes Alzheimer's disease in his brother; CAD in his father and mother; Dementia in his sister; Heart attack in his sister.    ROS:  Please see the history of present illness.    Review of Systems: Constitutional:  denies fever, chills, diaphoresis, appetite change and fatigue.  HEENT: denies photophobia, eye pain, redness, hearing loss, ear pain, congestion, sore throat, rhinorrhea, sneezing, neck pain, neck stiffness and tinnitus.  Respiratory: denies SOB, DOE, cough, chest tightness, and wheezing.  Cardiovascular: denies chest pain, palpitations and leg swelling.  Gastrointestinal: denies nausea, vomiting, abdominal pain, diarrhea, constipation, blood in stool.  Genitourinary: denies dysuria, urgency, frequency, hematuria, flank pain and difficulty urinating.  Musculoskeletal: denies  myalgias, back pain, joint swelling, arthralgias and gait problem.   Skin: denies pallor, rash and wound.  Neurological: denies dizziness, seizures, syncope, weakness, light-headedness, numbness and headaches.   Hematological: denies adenopathy, easy bruising,  personal or family bleeding history.  Psychiatric/ Behavioral: denies suicidal ideation, mood changes, confusion, nervousness, sleep disturbance and agitation.       All other systems are reviewed and negative.    PHYSICAL EXAM: VS:  BP 132/62 (BP Location: Right Arm)   Pulse 60   Ht 5\' 10"  (1.778 m)   Wt 159 lb (72.1 kg)   BMI 22.81 kg/m  , BMI Body mass index is 22.81 kg/m. GEN: Well nourished, well developed, in no acute distress  HEENT: normal  Neck: no JVD, carotid bruits, or masses Cardiac:  RRR; A999333 systolic murmur , rubs, or gallops,no edema  Respiratory:  clear to auscultation bilaterally, normal work of breathing GI: soft, nontender, nondistended, + BS MS: no deformity or atrophy  Skin: warm and dry, no rash Neuro:  Strength and sensation are intact Psych: normal   EKG:  EKG is ordered today.   NSR at 73.   RBBB   Recent Labs: No results found for requested labs within last 8760 hours.    Lipid Panel    Component Value Date/Time   CHOL 127 07/09/2015 0915   TRIG 109 07/09/2015 0915   HDL 49 07/09/2015 0915   CHOLHDL 2.6 07/09/2015 0915   VLDL 22 07/09/2015 0915   LDLCALC 56 07/09/2015 0915      Wt Readings from Last 3 Encounters:  09/23/16 159 lb (72.1 kg)  06/18/16 156 lb 6.4 oz (70.9 kg)  10/27/15 166 lb (75.3 kg)    ECG:  .  NSR with PACs.  RBBB.   Other studies Reviewed: Additional studies/ records that were reviewed today include:  Review of the above records demonstrates:    ASSESSMENT AND PLAN:  1.  Essential hypertension:-   He seems to be doing quite a bit better. His blood pressure is much better today. He has been getting lots of elevated readings. We brought him to the office today. His blood pressure here was quite normal. We have clarified that his losartan dose should be 50 mg twice a day currently. He was taking 75 mg throughout the day.  He will change the batteries in his blood pressure cuff and may ask the need to get a  new blood pressure cuff.  2. Aortic sclerosis: He has a soft systolic murmur. Echocardiogram performed in January, 2016  shows calcification of his aortic valve. There is no significant stenosis. He also has mild tricuspid regurgitation.  3. Tricuspid regurgitation: Benign. We'll continue to follow.  4. Hyperlipidemia: Patient has been on on atorvastatin 40 mg a day.    5. TIA: He had symptoms that lasted for several minutes when where he could not speak any words and was having trouble with his thoughts. It sounds like he had a TIA. He has known mild carotid artery disease. We'll repeat the duplex scan and we'll get an echo card gram. We will refer him to neurology for further evaluation.   Current medicines are reviewed at length with the patient today.  The patient does not have concerns regarding medicines   Disposition:   FU with me in 1 year       Signed, [name], MD  09/23/2016 9:29 AM    [address], [place], [place]  [address] Phone: [phone]; Fax: [phone]

## 2016-09-24 ENCOUNTER — Telehealth: Payer: Self-pay | Admitting: *Deleted

## 2016-09-24 MED ORDER — LOSARTAN POTASSIUM 50 MG PO TABS: 50.0000 mg | ORAL_TABLET | Freq: Every day | ORAL | 3 refills | Status: DC

## 2016-09-24 NOTE — Telephone Encounter (Signed)
Received a fax from [facility] requesting clarification on the losartan rx as it was sent in for #180 but it was ordered for the patient to take qd. Per yesterdays office visit  Patient Instructions   Medication Instructions:  TAKE Losartan (Cozaar) to 50 mg twice daily    Please advise on what current therapy should be. Thanks, Julian [name]

## 2016-09-24 NOTE — Telephone Encounter (Signed)
Entry error yesterday #90 should have been ordered; this has been corrected in patient's chart

## 2016-09-30 ENCOUNTER — Encounter: Payer: Self-pay | Admitting: Neurology

## 2016-09-30 ENCOUNTER — Ambulatory Visit (INDEPENDENT_AMBULATORY_CARE_PROVIDER_SITE_OTHER): Payer: Medicare Other | Admitting: Neurology

## 2016-09-30 VITALS — BP 120/70 | HR 62 | Ht 70.0 in | Wt 161.0 lb

## 2016-09-30 DIAGNOSIS — E785 Hyperlipidemia, unspecified: Secondary | ICD-10-CM

## 2016-09-30 DIAGNOSIS — G459 Transient cerebral ischemic attack, unspecified: Secondary | ICD-10-CM

## 2016-09-30 DIAGNOSIS — I10 Essential (primary) hypertension: Secondary | ICD-10-CM

## 2016-09-30 NOTE — Progress Notes (Signed)
NEUROLOGY CONSULTATION NOTE  Julian [name] MRN: [id number] DOB: 03-19-1926  Referring provider: Dr. Acie Fredrickson Primary care provider: Dr. Brigitte Pulse  Reason for consult:  TIA  HISTORY OF PRESENT ILLNESS: Julian [name] is an 83 year old left-handed male with hypertension, hyperlipidemia, and history of prostate cancer who presents for transient ischemic attack.  He is accompanied by his wife who supplements history.  History also supplemented by cardiology note.  About 3 weeks ago, he had an episode of speech disturbance, in which he was slurring his words.  He knew what he wanted to say, but had difficulty getting words out.  It lasted about 5 minutes and resolved.  For the previous two days, he felt confused.  He had trouble performing tasks that he would normally know how to perform.  For example, he had difficulty fixing a watch.  He did not have trouble using his hands but rather had difficulty figuring out what to do.  He did not have headache, facial droop, dizziness, visual disturbance, gait instability, or focal numbness or weakness.  He was started on ASA 81mg  daily.  He does endorse history of elevated blood pressure with SBP in the 170s to 200.  He has since felt well.  Labs from 09/14/16:  CMP demonstrated Na 141, K 4.3, Cl 107, glucose 100, BUN 18, Cr 0.9, total bili 0.9, ALP 81, AST 20, and ALT 22.  CBC demonstrated WBC 6.41, HGB 13, HCT 43.6 and PLT 247.  LDL was 66. LDL from 07/09/15 was 56.  Echocardiogram and carotid dopplers have been ordered for 10/11/16.  Carotid doppler from 09/12/15 showed stable heterogeneous plaque bilaterally with no hemodynamically significant stenosis. Echo from 09/04/14 showed normal LVEF with calcification of aortic valve and mild tricuspid regurgitation.  PAST MEDICAL HISTORY: Past Medical History:  Diagnosis Date  . Anemia   . Hyperlipidemia   . Hypertension   . Prostate cancer ([place])     PAST SURGICAL HISTORY: Past Surgical History:  Procedure  Laterality Date  . CARPAL TUNNEL RELEASE Left   . HERNIA REPAIR     MID 80'S  . KNEE SURGERY Left   . PROSTATECTOMY  2001    MEDICATIONS: Current Outpatient Prescriptions on File Prior to Visit  Medication Sig Dispense Refill  . alendronate (FOSAMAX) 70 MG tablet Take 70 mg by mouth once a week. Take with a full glass of water on an empty stomach.    [name] atorvastatin (LIPITOR) 40 MG tablet TAKE 1 TABLET BY MOUTH  DAILY 90 tablet 3  . Cyanocobalamin (B-12) 5000 MCG CAPS Take 1 tablet by mouth daily.    [name] Fish Oil-Cholecalciferol (FISH OIL + D3 PO) Take 1,400 mg by mouth daily.    . fluticasone (FLONASE) 50 MCG/ACT nasal spray Place 2 sprays into both nostrils as needed for allergies or rhinitis.    [name] losartan (COZAAR) 50 MG tablet Take 1 tablet (50 mg total) by mouth daily. 90 tablet 3  . Lutein-Zeaxanthin 25-5 MG CAPS Take 1 tablet by mouth daily.     . Multiple Vitamin (MULTIVITAMIN) tablet Take 1 tablet by mouth daily. CENTRUM SILVER     No current facility-administered medications on file prior to visit.     ALLERGIES: No Known Allergies  FAMILY HISTORY: Family History  Problem Relation Age of Onset  . CAD Mother   . CAD Father   . Dementia Sister   . Heart attack Sister   . Alzheimer's disease Brother     SOCIAL HISTORY: Social  History   Social History  . Marital status: Married    Spouse name: N/A  . Number of children: N/A  . Years of education: N/A   Occupational History  . Not on file.   Social History Main Topics  . Smoking status: Former Smoker    Quit date: 09/30/1981  . Smokeless tobacco: Never Used  . Alcohol use Yes     Comment: 5 oz a week  . Drug use: No  . Sexual activity: Not on file   Other Topics Concern  . Not on file   Social History Narrative  . No narrative on file    REVIEW OF SYSTEMS: Constitutional: No fevers, chills, or sweats, no generalized fatigue, change in appetite Eyes: No visual changes, double vision, eye pain Ear,  nose and throat: No hearing loss, ear pain, nasal congestion, sore throat Cardiovascular: No chest pain, palpitations Respiratory:  No shortness of breath at rest or with exertion, wheezes GastrointestinaI: No nausea, vomiting, diarrhea, abdominal pain, fecal incontinence Genitourinary:  No dysuria, urinary retention or frequency Musculoskeletal:  No neck pain, back pain Integumentary: No rash, pruritus, skin lesions Neurological: as above Psychiatric: No depression, insomnia, anxiety Endocrine: No palpitations, fatigue, diaphoresis, mood swings, change in appetite, change in weight, increased thirst Hematologic/Lymphatic:  No purpura, petechiae. Allergic/Immunologic: no itchy/runny eyes, nasal congestion, recent allergic reactions, rashes  PHYSICAL EXAM: Vitals:   09/30/16 0750  BP: 120/70  Pulse: 62   General: No acute distress.  Patient appears well-groomed.  Head:  Normocephalic/atraumatic Eyes:  fundi examined but not visualized Neck: supple, no paraspinal tenderness, full range of motion Back: No paraspinal tenderness Heart: regular rate and rhythm Lungs: Clear to auscultation bilaterally. Vascular: No carotid bruits. Neurological Exam: Mental status: alert and oriented to person, place, and time, recent and remote memory intact, fund of knowledge intact, attention and concentration intact, speech fluent and not dysarthric, language intact. Cranial nerves: CN I: not tested CN II: pupils equal, round and reactive to light, visual fields intact CN III, IV, VI:  full range of motion, no nystagmus, no ptosis CN V: facial sensation intact CN VII: upper and lower face symmetric CN VIII: hearing intact CN IX, X: gag intact, uvula midline CN XI: sternocleidomastoid and trapezius muscles intact CN XII: tongue midline Bulk & Tone: normal, no fasciculations. Motor:  5/5 throughout  Sensation: temperature and vibration sensation intact. Deep Tendon Reflexes:  2+ throughout, toes  downgoing.  Finger to nose testing:  Without dysmetria.  Heel to shin:  Without dysmetria.  Gait:  Normal station and stride.  Able to turn and tandem walk. Romberg negative.  IMPRESSION: 1.  Transient ischemic attack (or cerebrovascular accident, as he did exhibit symptoms lasting greater than 24 hours) 2.  Hypertension 3.  Hyperlipidemia  PLAN: 1.  Agree with taking aspirin 81mg  daily 2.  Continue Lipitor.  LDL at goal of less than 70 3.  Continue blood pressure control 4.  Echocardiogram and carotid doppler ordered. 5.  Will get MRI of brain 6.  Mediterranean diet 7.  Follow up in 3 months.   Thank you for allowing me to take part in the care of this patient.  Metta Clines, DO  CC:  [name], MD  [name], MD

## 2016-09-30 NOTE — Patient Instructions (Signed)
1.  Agree with taking aspirin 81mg  daily 2.  Continue Lipitor.  Cholesterol looks good 3.  Continue blood pressure control 4.  Contact me after your tests are performed, so I know to review them 5.  Will get MRI of brain 6.  Mediterranean diet  Mediterranean Diet A Mediterranean diet refers to food and lifestyle choices that are based on the traditions of countries located on the [employer]. This way of eating has been shown to help prevent certain conditions and improve outcomes for people who have chronic diseases, like kidney disease and heart disease. What are tips for following this plan? Lifestyle  Cook and eat meals together with your family, when possible.  Drink enough fluid to keep your urine clear or pale yellow.  Be physically active every day. This includes:  Aerobic exercise like running or swimming.  Leisure activities like gardening, walking, or housework.  Get 7-8 hours of sleep each night.  If recommended by your health care provider, drink red wine in moderation. This means 1 glass a day for nonpregnant women and 2 glasses a day for men. A glass of wine equals 5 oz (150 mL). Reading food labels  Check the serving size of packaged foods. For foods such as rice and pasta, the serving size refers to the amount of cooked product, not dry.  Check the total fat in packaged foods. Avoid foods that have saturated fat or trans fats.  Check the ingredients list for added sugars, such as corn syrup. Shopping  At the grocery store, buy most of your food from the areas near the walls of the store. This includes:  Fresh fruits and vegetables (produce).  Grains, beans, nuts, and seeds. Some of these may be available in unpackaged forms or large amounts (in bulk).  Fresh seafood.  Poultry and eggs.  Low-fat dairy products.  Buy whole ingredients instead of prepackaged foods.  Buy fresh fruits and vegetables in-season from local farmers markets.  Buy frozen  fruits and vegetables in resealable bags.  If you do not have access to quality fresh seafood, buy precooked frozen shrimp or canned fish, such as tuna, salmon, or sardines.  Buy small amounts of raw or cooked vegetables, salads, or olives from the deli or salad bar at your store.  Stock your pantry so you always have certain foods on hand, such as olive oil, canned tuna, canned tomatoes, rice, pasta, and beans. Cooking  Cook foods with extra-virgin olive oil instead of using butter or other vegetable oils.  Have meat as a side dish, and have vegetables or grains as your main dish. This means having meat in small portions or adding small amounts of meat to foods like pasta or stew.  Use beans or vegetables instead of meat in common dishes like chili or lasagna.  Experiment with different cooking methods. Try roasting or broiling vegetables instead of steaming or sauteing them.  Add frozen vegetables to soups, stews, pasta, or rice.  Add nuts or seeds for added healthy fat at each meal. You can add these to yogurt, salads, or vegetable dishes.  Marinate fish or vegetables using olive oil, lemon juice, garlic, and fresh herbs. Meal planning  Plan to eat 1 vegetarian meal one day each week. Try to work up to 2 vegetarian meals, if possible.  Eat seafood 2 or more times a week.  Have healthy snacks readily available, such as:  Vegetable sticks with hummus.  Greek yogurt.  Fruit and nut trail mix.  Eat balanced meals throughout the week. This includes:  Fruit: 2-3 servings a day  Vegetables: 4-5 servings a day  Low-fat dairy: 2 servings a day  Fish, poultry, or lean meat: 1 serving a day  Beans and legumes: 2 or more servings a week  Nuts and seeds: 1-2 servings a day  Whole grains: 6-8 servings a day  Extra-virgin olive oil: 3-4 servings a day  Limit red meat and sweets to only a few servings a month What are my food choices?  Mediterranean  diet  Recommended  Grains: Whole-grain pasta. Brown rice. Bulgar wheat. Polenta. Couscous. Whole-wheat bread. [place].  Vegetables: Artichokes. Beets. Broccoli. Cabbage. Carrots. Eggplant. Green beans. Chard. Kale. Spinach. Onions. Leeks. Peas. Squash. Tomatoes. Peppers. Radishes.  Fruits: Apples. Apricots. Avocado. Berries. Bananas. Cherries. Dates. Figs. Grapes. Lemons. Melon. Oranges. Peaches. Plums. Pomegranate.  Meats and other protein foods: Beans. Almonds. Sunflower seeds. Pine nuts. Peanuts. [place]. Salmon. Scallops. Shrimp. [place]. Tilapia. Clams. Oysters. Eggs.  Dairy: Low-fat milk. Cheese. Greek yogurt.  Beverages: Water. Red wine. Herbal tea.  Fats and oils: Extra virgin olive oil. Avocado oil. Grape seed oil.  Sweets and desserts: [place] yogurt with honey. Baked apples. Poached pears. Trail mix.  Seasoning and other foods: Basil. Cilantro. Coriander. Cumin. Mint. Parsley. Sage. Rosemary. Tarragon. Garlic. Oregano. Thyme. Pepper. Balsalmic vinegar. Tahini. Hummus. Tomato sauce. Olives. Mushrooms.  Limit these  Grains: Prepackaged pasta or rice dishes. Prepackaged cereal with added sugar.  Vegetables: Deep fried potatoes (french fries).  Fruits: Fruit canned in syrup.  Meats and other protein foods: Beef. Pork. Lamb. Poultry with skin. Hot dogs. Bernie[place] Salines.  Dairy: Ice cream. Sour cream. Whole milk.  Beverages: Juice. Sugar-sweetened soft drinks. Beer. Liquor and spirits.  Fats and oils: Butter. Canola oil. Vegetable oil. Beef fat (tallow). Lard.  Sweets and desserts: Cookies. Cakes. Pies. Candy.  Seasoning and other foods: Mayonnaise. Premade sauces and marinades.  The items listed may not be a complete list. Talk with your dietitian about what dietary choices are right for you. Summary  The Mediterranean diet includes both food and lifestyle choices.  Eat a variety of fresh fruits and vegetables, beans, nuts, seeds, and whole grains.  Limit the amount of red  meat and sweets that you eat.  Talk with your health care provider about whether it is safe for you to drink red wine in moderation. This means 1 glass a day for nonpregnant women and 2 glasses a day for men. A glass of wine equals 5 oz (150 mL). This information is not intended to replace advice given to you by your health care provider. Make sure you discuss any questions you have with your health care provider. Document Released: 04/01/2016 Document Revised: 05/04/2016 Document Reviewed: 04/01/2016 Elsevier Interactive Patient Education  2017 [place].  7.  Follow up in 3 months.

## 2016-10-04 ENCOUNTER — Ambulatory Visit
Admission: RE | Admit: 2016-10-04 | Discharge: 2016-10-04 | Disposition: A | Discharge status: Home / Self Care | Payer: Medicare Other | Source: Ambulatory Visit | Attending: Neurology | Admitting: Neurology

## 2016-10-04 DIAGNOSIS — G459 Transient cerebral ischemic attack, unspecified: Secondary | ICD-10-CM

## 2016-10-04 DIAGNOSIS — R4781 Slurred speech: Secondary | ICD-10-CM | POA: Diagnosis not present

## 2016-10-05 ENCOUNTER — Telehealth: Payer: Self-pay

## 2016-10-05 DIAGNOSIS — D1801 Hemangioma of skin and subcutaneous tissue: Secondary | ICD-10-CM | POA: Diagnosis not present

## 2016-10-05 DIAGNOSIS — I10 Essential (primary) hypertension: Secondary | ICD-10-CM | POA: Diagnosis not present

## 2016-10-05 DIAGNOSIS — L814 Other melanin hyperpigmentation: Secondary | ICD-10-CM | POA: Diagnosis not present

## 2016-10-05 DIAGNOSIS — Z85828 Personal history of other malignant neoplasm of skin: Secondary | ICD-10-CM | POA: Diagnosis not present

## 2016-10-05 DIAGNOSIS — L821 Other seborrheic keratosis: Secondary | ICD-10-CM | POA: Diagnosis not present

## 2016-10-05 DIAGNOSIS — L570 Actinic keratosis: Secondary | ICD-10-CM | POA: Diagnosis not present

## 2016-10-05 NOTE — Telephone Encounter (Signed)
Spoke to patient. Gave MRI results. Patient verbalized understanding.  

## 2016-10-05 NOTE — Telephone Encounter (Signed)
-----   Message from Pieter Partridge, DO sent at 10/05/2016  7:35 AM EST ----- MRI did not reveal any recent stroke, but I still believe that he likely had a stroke.

## 2016-10-06 ENCOUNTER — Telehealth: Payer: Self-pay | Admitting: Cardiovascular Disease

## 2016-10-06 MED ORDER — LOSARTAN POTASSIUM 50 MG PO TABS: 50.0000 mg | ORAL_TABLET | Freq: Two times a day (BID) | ORAL | 3 refills | Status: DC

## 2016-10-06 MED ORDER — AMLODIPINE BESYLATE 5 MG PO TABS: 2.5000 mg | ORAL_TABLET | Freq: Every day | ORAL | 3 refills | Status: DC

## 2016-10-06 NOTE — Telephone Encounter (Signed)
Continue Losartan 100 mg a day ( it sounds like he is taking that much already  Add amlodipine 5 mg a day  Continue to monitor

## 2016-10-06 NOTE — Telephone Encounter (Signed)
Spoke with wife, DPR on file.  Wife states over the last 3 days pt's BP has been trending up.  Ranges from 176-201/90-100, HR 60's.  Pt seen by PCP yesterday and they put a 24 hr BP monitor on him.  Wife states earlier BP was 200/100.  Denies salt in diet.  Denies other cardiac issues (No HA, dizziness, visual disturbances, CP, SOB, etc).  Pt having echo, carotid and labs on 10/11/16. Wife said pt is taking his Losartan BID (prescribed QD).  Wife concerned that BP is elevated and feels pt may need a new medication.  Pt going back to PCP to turn in BP monitor today.  Advised I will send message to Dr. Acie Fredrickson and his nurse for review, advisement and follow up.  Wife appreciative for assistance.

## 2016-10-06 NOTE — Telephone Encounter (Signed)
Spoke with patient's wife. She states BP remains high on monitor given by doctor's office as well as their home monitor. I reviewed Dr. Elmarie Shiley advice with her and she states patient is not currently taking any amlodipine. She states patient has bottle of amlodipine 2.5 mg in the house. I advised her to have him start taking those and report back if BP does not improve. She states that the patient has appointments for 3 different tests on Monday - echo, lab and carotid. I advised her that if patient's BP has not improved that she can ask that our office get a message to Dr. Acie Fredrickson who will be working in the hospital all week next week. Wife verbalized understanding and agreement and thanked me for the call.

## 2016-10-06 NOTE — Telephone Encounter (Signed)
New Message:    Pt's blood pressure is up to 200/100

## 2016-10-08 DIAGNOSIS — I10 Essential (primary) hypertension: Secondary | ICD-10-CM | POA: Diagnosis not present

## 2016-10-11 ENCOUNTER — Other Ambulatory Visit: Payer: Self-pay

## 2016-10-11 ENCOUNTER — Ambulatory Visit (HOSPITAL_COMMUNITY): Payer: Medicare Other | Attending: Cardiovascular Disease

## 2016-10-11 ENCOUNTER — Ambulatory Visit (HOSPITAL_COMMUNITY)
Admission: RE | Admit: 2016-10-11 | Discharge: 2016-10-11 | Disposition: A | Discharge status: Home / Self Care | Payer: Medicare Other | Source: Ambulatory Visit | Attending: Internal Medicine | Admitting: Internal Medicine

## 2016-10-11 ENCOUNTER — Other Ambulatory Visit: Payer: Medicare Other | Admitting: *Deleted

## 2016-10-11 DIAGNOSIS — I739 Peripheral vascular disease, unspecified: Secondary | ICD-10-CM

## 2016-10-11 DIAGNOSIS — G459 Transient cerebral ischemic attack, unspecified: Secondary | ICD-10-CM

## 2016-10-11 DIAGNOSIS — I779 Disorder of arteries and arterioles, unspecified: Secondary | ICD-10-CM | POA: Diagnosis not present

## 2016-10-11 DIAGNOSIS — Z87891 Personal history of nicotine dependence: Secondary | ICD-10-CM | POA: Insufficient documentation

## 2016-10-11 DIAGNOSIS — I10 Essential (primary) hypertension: Secondary | ICD-10-CM

## 2016-10-11 DIAGNOSIS — I361 Nonrheumatic tricuspid (valve) insufficiency: Secondary | ICD-10-CM | POA: Diagnosis not present

## 2016-10-11 DIAGNOSIS — I6523 Occlusion and stenosis of bilateral carotid arteries: Secondary | ICD-10-CM | POA: Diagnosis not present

## 2016-10-11 DIAGNOSIS — E785 Hyperlipidemia, unspecified: Secondary | ICD-10-CM | POA: Insufficient documentation

## 2016-10-11 DIAGNOSIS — I501 Left ventricular failure: Secondary | ICD-10-CM | POA: Diagnosis not present

## 2016-10-11 LAB — BASIC METABOLIC PANEL
BUN/Creatinine Ratio: 17 (ref 10–24)
BUN: 17 mg/dL (ref 8–27)
CO2: 24 mmol/L (ref 18–29)
Calcium: 8.6 mg/dL (ref 8.6–10.2)
Chloride: 96 mmol/L (ref 96–106)
Creatinine, Ser: 1.01 mg/dL (ref 0.76–1.27)
GFR calc Af Amer: 76 mL/min/{1.73_m2} (ref >59)
GFR calc non Af Amer: 66 mL/min/{1.73_m2} (ref >59)
GLUCOSE: 113 mg/dL — AB (ref 65–99)
Potassium: 4.1 mmol/L (ref 3.5–5.2)
SODIUM: 135 mmol/L (ref 134–144)

## 2016-10-13 ENCOUNTER — Telehealth: Payer: Self-pay | Admitting: Cardiovascular Disease

## 2016-10-13 NOTE — Telephone Encounter (Signed)
Patient wants to make sure Dr. Acie Fredrickson is aware of this new medication that Dr. Brigitte Pulse started. Patient is concerned about taking a new medication with out Dr. Elmarie Shiley approval. Patient is taking diltiazem 120 mg by mouth daily. Will forward to Dr. Acie Fredrickson, so he is aware and can advise on new medication.

## 2016-10-13 NOTE — Telephone Encounter (Signed)
Dr. Brigitte Pulse has prescribed patient a new medication. Informed patient that our office needs to know the name of this medication. Informed patient that there are no office notes from Dr. Raul Del office at this time in Dr. Elmarie Shiley basket. Patient stated he would call back with the name of the medication.

## 2016-10-13 NOTE — Telephone Encounter (Signed)
New Message:   Pt said his primary doctor prescribed some new medicine(he did not know the name of the medicine).He said his primary doctor sent it to his pharmacy,he have not picked it up.He wants to make sure Dr Acie Fredrickson approve of the medicine before he takes it.

## 2016-10-13 NOTE — Telephone Encounter (Signed)
New message    Pt verbalized that she is calling to give rn the medication  DILTIANZEM 120mg  1xday

## 2016-10-14 ENCOUNTER — Telehealth: Payer: Self-pay

## 2016-10-14 MED ORDER — AMLODIPINE BESYLATE 5 MG PO TABS: 5.0000 mg | ORAL_TABLET | Freq: Every day | ORAL | 3 refills | Status: DC

## 2016-10-14 NOTE — Telephone Encounter (Signed)
Sent notes to scheduling 

## 2016-10-14 NOTE — Telephone Encounter (Signed)
Spoke with patient's wife who states patient wore 24 hour BP monitor as prescribed by Dr. Brigitte Pulse, PCP. He completed the monitor and was advised yesterday by Dr. Brigitte Pulse to start Diltiazem 120 mg daily due to systolic BP average of 123XX123 mmHg. Wife states patient does not want to start the medication without first discussing with Dr. Acie Fredrickson. I advised that per Dr. Acie Fredrickson, he is already on amlodipine which is in the same family as diltiazem. Wife states Dr. Brigitte Pulse wanted patient to d/c amlodipine and start diltiazem. I advised that patient may either increase amlodipine to 5 mg daily or d/c amlodipine and start diltiazem. She states they will increase the amlodipine to 5 mg daily. I scheduled patient for BP check with me on Monday March 5 at 11 am. I advised her to call back sooner with questions or concerns. She verbalized understanding and agreement and thanked me for the call.

## 2016-10-14 NOTE — Telephone Encounter (Signed)
I note that his medical doctor added Diltiazem 120 a day Mr Ficca is already on Amlodipine 2.5 mg a day and Losartan 50 mg a day  ( please clarify when he is called) I would not add Diltiazem since he is already on a calcium channel blocker I would increase Losartan to 100 mg a day  We may need to work him into a slot next week

## 2016-10-14 NOTE — Addendum Note (Signed)
Addended by: Emmaline Life on: 10/14/2016 10:36 AM   Modules accepted: Orders

## 2016-10-25 ENCOUNTER — Ambulatory Visit (INDEPENDENT_AMBULATORY_CARE_PROVIDER_SITE_OTHER): Payer: Medicare Other | Admitting: Nurse Practitioner

## 2016-10-25 VITALS — BP 110/60 | HR 60 | Ht 70.0 in | Wt 156.8 lb

## 2016-10-25 DIAGNOSIS — I998 Other disorder of circulatory system: Secondary | ICD-10-CM

## 2016-10-25 DIAGNOSIS — I499 Cardiac arrhythmia, unspecified: Secondary | ICD-10-CM | POA: Diagnosis not present

## 2016-10-25 NOTE — Progress Notes (Signed)
1.) Reason for visit: BP check  2.) Name of MD requesting visit: Dr. Acie Fredrickson  3.) H&P: Patient presents for BP Check since increasing amlodipine to 5 mg on 2/22 due to elevated BP readings at PCP office and at home. Dr. Brigitte Pulse, patient's PCP advised patient to start diltiazem 120 mg but patient called to ask Dr. Elmarie Shiley advice before starting. We advised him to hold diltiazem and instead increase amlodipine from 2.5 mg to 5 mg daily  4.) ROS related to problem: Patient states he has been feeling well. States they were in [place] this weekend and he walked 4 miles per day without difficulty. Initial BP in right arm was 160/68 mmHg. After sitting for awhile, I checked BP in left arm and it was 110/60 mmHg. Recheck of BP in right arm was 130/64.   5.) Assessment and plan per MD:

## 2016-10-25 NOTE — Patient Instructions (Signed)
Medication Instructions:  Your physician recommends that you continue on your current medications as directed. Please refer to the Current Medication list given to you today.   Labwork: None Ordered   Testing/Procedures: None Ordered   Follow-Up: Dr. Elmarie Shiley nurse, Sharyn Lull, will call you to schedule follow-up    If you need a refill on your cardiac medications before your next appointment, please call your pharmacy.   Thank you for choosing [facility]! Christen Bame, RN [phone]

## 2016-10-28 ENCOUNTER — Other Ambulatory Visit: Payer: Self-pay | Admitting: Cardiovascular Disease

## 2016-10-28 DIAGNOSIS — L570 Actinic keratosis: Secondary | ICD-10-CM | POA: Diagnosis not present

## 2016-10-28 DIAGNOSIS — I998 Other disorder of circulatory system: Secondary | ICD-10-CM

## 2016-11-02 ENCOUNTER — Other Ambulatory Visit: Payer: Self-pay | Admitting: Cardiovascular Disease

## 2016-11-02 ENCOUNTER — Ambulatory Visit (HOSPITAL_COMMUNITY)
Admission: RE | Admit: 2016-11-02 | Discharge: 2016-11-02 | Disposition: A | Discharge status: Home / Self Care | Payer: Medicare Other | Source: Ambulatory Visit | Attending: Cardiovascular Disease | Admitting: Cardiovascular Disease

## 2016-11-02 DIAGNOSIS — I998 Other disorder of circulatory system: Secondary | ICD-10-CM | POA: Insufficient documentation

## 2016-11-10 ENCOUNTER — Ambulatory Visit (INDEPENDENT_AMBULATORY_CARE_PROVIDER_SITE_OTHER): Payer: Medicare Other

## 2016-11-10 ENCOUNTER — Other Ambulatory Visit: Payer: Self-pay | Admitting: Cardiovascular Disease

## 2016-11-10 DIAGNOSIS — G458 Other transient cerebral ischemic attacks and related syndromes: Secondary | ICD-10-CM

## 2016-11-10 DIAGNOSIS — I499 Cardiac arrhythmia, unspecified: Secondary | ICD-10-CM

## 2016-11-10 DIAGNOSIS — G459 Transient cerebral ischemic attack, unspecified: Secondary | ICD-10-CM | POA: Diagnosis not present

## 2016-11-29 ENCOUNTER — Ambulatory Visit: Payer: Medicare Other | Admitting: Neurology

## 2016-12-21 DIAGNOSIS — H01025 Squamous blepharitis left lower eyelid: Secondary | ICD-10-CM | POA: Diagnosis not present

## 2016-12-21 DIAGNOSIS — Z9841 Cataract extraction status, right eye: Secondary | ICD-10-CM | POA: Diagnosis not present

## 2016-12-21 DIAGNOSIS — H04123 Dry eye syndrome of bilateral lacrimal glands: Secondary | ICD-10-CM | POA: Diagnosis not present

## 2016-12-21 DIAGNOSIS — C884 Extranodal marginal zone B-cell lymphoma of mucosa-associated lymphoid tissue [MALT-lymphoma]: Secondary | ICD-10-CM | POA: Diagnosis not present

## 2016-12-21 DIAGNOSIS — Z923 Personal history of irradiation: Secondary | ICD-10-CM | POA: Diagnosis not present

## 2016-12-21 DIAGNOSIS — Z9842 Cataract extraction status, left eye: Secondary | ICD-10-CM | POA: Diagnosis not present

## 2016-12-21 DIAGNOSIS — I781 Nevus, non-neoplastic: Secondary | ICD-10-CM | POA: Diagnosis not present

## 2016-12-21 DIAGNOSIS — H01024 Squamous blepharitis left upper eyelid: Secondary | ICD-10-CM | POA: Diagnosis not present

## 2016-12-21 DIAGNOSIS — I10 Essential (primary) hypertension: Secondary | ICD-10-CM | POA: Diagnosis not present

## 2016-12-21 DIAGNOSIS — Z87891 Personal history of nicotine dependence: Secondary | ICD-10-CM | POA: Diagnosis not present

## 2016-12-21 DIAGNOSIS — H01022 Squamous blepharitis right lower eyelid: Secondary | ICD-10-CM | POA: Diagnosis not present

## 2016-12-21 DIAGNOSIS — H01021 Squamous blepharitis right upper eyelid: Secondary | ICD-10-CM | POA: Diagnosis not present

## 2016-12-21 DIAGNOSIS — Z961 Presence of intraocular lens: Secondary | ICD-10-CM | POA: Diagnosis not present

## 2016-12-28 ENCOUNTER — Ambulatory Visit: Payer: Medicare Other | Admitting: Neurology

## 2017-01-03 DIAGNOSIS — Z8572 Personal history of non-Hodgkin lymphomas: Secondary | ICD-10-CM | POA: Diagnosis not present

## 2017-01-03 DIAGNOSIS — C884 Extranodal marginal zone B-cell lymphoma of mucosa-associated lymphoid tissue [MALT-lymphoma]: Secondary | ICD-10-CM | POA: Diagnosis not present

## 2017-01-03 DIAGNOSIS — Z08 Encounter for follow-up examination after completed treatment for malignant neoplasm: Secondary | ICD-10-CM | POA: Diagnosis not present

## 2017-01-03 DIAGNOSIS — Z923 Personal history of irradiation: Secondary | ICD-10-CM | POA: Diagnosis not present

## 2017-01-07 DIAGNOSIS — Z923 Personal history of irradiation: Secondary | ICD-10-CM | POA: Diagnosis not present

## 2017-01-07 DIAGNOSIS — C884 Extranodal marginal zone B-cell lymphoma of mucosa-associated lymphoid tissue [MALT-lymphoma]: Secondary | ICD-10-CM | POA: Diagnosis not present

## 2017-01-18 DIAGNOSIS — C884 Extranodal marginal zone B-cell lymphoma of mucosa-associated lymphoid tissue [MALT-lymphoma]: Secondary | ICD-10-CM | POA: Diagnosis not present

## 2017-01-20 DIAGNOSIS — I781 Nevus, non-neoplastic: Secondary | ICD-10-CM | POA: Diagnosis not present

## 2017-01-20 DIAGNOSIS — I10 Essential (primary) hypertension: Secondary | ICD-10-CM | POA: Diagnosis not present

## 2017-01-20 DIAGNOSIS — Z9079 Acquired absence of other genital organ(s): Secondary | ICD-10-CM | POA: Diagnosis not present

## 2017-01-20 DIAGNOSIS — Z9841 Cataract extraction status, right eye: Secondary | ICD-10-CM | POA: Diagnosis not present

## 2017-01-20 DIAGNOSIS — Z79899 Other long term (current) drug therapy: Secondary | ICD-10-CM | POA: Diagnosis not present

## 2017-01-20 DIAGNOSIS — C884 Extranodal marginal zone B-cell lymphoma of mucosa-associated lymphoid tissue [MALT-lymphoma]: Secondary | ICD-10-CM | POA: Diagnosis not present

## 2017-01-20 DIAGNOSIS — Z87891 Personal history of nicotine dependence: Secondary | ICD-10-CM | POA: Diagnosis not present

## 2017-01-20 DIAGNOSIS — C8599 Non-Hodgkin lymphoma, unspecified, extranodal and solid organ sites: Secondary | ICD-10-CM | POA: Diagnosis not present

## 2017-01-20 DIAGNOSIS — Z9842 Cataract extraction status, left eye: Secondary | ICD-10-CM | POA: Diagnosis not present

## 2017-01-20 DIAGNOSIS — Z8546 Personal history of malignant neoplasm of prostate: Secondary | ICD-10-CM | POA: Diagnosis not present

## 2017-01-20 DIAGNOSIS — Z7982 Long term (current) use of aspirin: Secondary | ICD-10-CM | POA: Diagnosis not present

## 2017-01-20 DIAGNOSIS — Z961 Presence of intraocular lens: Secondary | ICD-10-CM | POA: Diagnosis not present

## 2017-01-21 DIAGNOSIS — C884 Extranodal marginal zone B-cell lymphoma of mucosa-associated lymphoid tissue [MALT-lymphoma]: Secondary | ICD-10-CM | POA: Diagnosis not present

## 2017-01-25 ENCOUNTER — Telehealth: Payer: Self-pay | Admitting: Cardiovascular Disease

## 2017-01-25 NOTE — Telephone Encounter (Signed)
New Message  Pt wife call requesting to speak with RN about getting a sooner appt with Dr. Acie Fredrickson than next available. Please call back to discuss

## 2017-01-25 NOTE — Telephone Encounter (Signed)
Spoke with patient's wife who would like patient to be seen by Dr. Acie Fredrickson soon due to he is starting chemotherapy on June 13. She states the patient has been doing well, but BP has been low at times and patient has had occasional dizziness. I offered her an appointment for tomorrow and she verbalized agreement. She thanked me for the call.

## 2017-01-26 ENCOUNTER — Ambulatory Visit (INDEPENDENT_AMBULATORY_CARE_PROVIDER_SITE_OTHER): Payer: Medicare Other | Admitting: Cardiovascular Disease

## 2017-01-26 ENCOUNTER — Encounter: Payer: Self-pay | Admitting: Cardiovascular Disease

## 2017-01-26 VITALS — BP 128/76 | HR 77 | Ht 70.0 in | Wt 162.0 lb

## 2017-01-26 DIAGNOSIS — I10 Essential (primary) hypertension: Secondary | ICD-10-CM

## 2017-01-26 DIAGNOSIS — E785 Hyperlipidemia, unspecified: Secondary | ICD-10-CM | POA: Insufficient documentation

## 2017-01-26 DIAGNOSIS — E782 Mixed hyperlipidemia: Secondary | ICD-10-CM

## 2017-01-26 MED ORDER — AMLODIPINE BESYLATE 2.5 MG PO TABS: 2.5000 mg | ORAL_TABLET | Freq: Every day | ORAL | 1 refills | Status: DC

## 2017-01-26 NOTE — Patient Instructions (Addendum)
Medication Instructions:  DECREASE Amlodipine to 2.5 mg daily AS NEEDED   Labwork: Your physician recommends that you return for lab work in: 6 months on the day of or a few days before your office visit with Dr. Acie Fredrickson.  You will need to FAST for this appointment - nothing to eat or drink after midnight the night before except water.    Testing/Procedures: None Ordered   Follow-Up: Your physician wants you to follow-up in: 6 months with Dr. Acie Fredrickson.  You will receive a reminder letter in the mail two months in advance. If you don't receive a letter, please call our office to schedule the follow-up appointment.   If you need a refill on your cardiac medications before your next appointment, please call your pharmacy.   Thank you for choosing [facility]! Christen Bame, RN [phone]

## 2017-01-26 NOTE — Progress Notes (Signed)
Cardiology Office Note   Date:  01/26/2017   ID:  [name], DOB 04/17/1926, MRN [id number]  PCP:  [name], MD  Cardiologist:   [name], MD   Chief Complaint  Patient presents with  . Follow-up   1. Hypertension  [name] is an 83 yo , hx of HTN. Works out regularly. Does the elliptical without any difficulty. Has had some mild episodes of faint feeling. Occurred during Exercising. Occasionally has orthostatic hypotension. A month ago. He was driving and felt a bit faint. Thought that he might need to pull over to the side of the road. Drinks water while exercising,  Episodes are not always related to exercise  6-7 years ago he had an episode of very high BP. He was started on BP meds at that time. Resolved after several hours,   Non smoker ETOH 2 drinks a day Fhx: +, mother died at 12, father died age 56 , sister died of complications related to alzheimers. , brother had valve replacement. Retired from [employer].  Moved from [place], [place] several years ago[place].    10/23/2014: [name] is a 83 y.o. male who presents for follow-up for his high blood pressure.  we decreased the amlodipine due to some orthostatic hypotension. BP has been ok at home.   Eats well most of the time.  Occasionally gets a bit of extra salt.   Still very active.    April 22, 2015:  still having some orthostatic hypotension.  BP is normal at  Has had some occasional palpitations  - also sees it registers on his BP cuff. Gives an error message .  His amlodipine dose was reduced about a year ago .  Still has episodes of dizziness - only lasts about 2-3 seconds  These episodes can occur while seated - and even had occurred while driving  Do not occur when he has been exercising .  Does the elliptical machine for 40 minutes without any CP or dyspnea, no dizziness  Then works out with [employer].   07/09/2015:  [name] is seen  today for variable BP.   He had some orthostatic hypotension symptoms ,  We had stopped his amlodipine.  BP increased slightly .   We started back Amlodipine at 2. 5 mg a day  He brought his blood pressure log with him today. Most of his readings are mildly to moderately elevated.  October 27, 2015  [name] is doing well.  Brought his BP log. Most of his readings are normal .  No CP or dyspnea.   No dizziness since we  Adjusted his amlodipine therapy Is through with his XRT therapy   Oct. 27, 2017:  [name] is seen for follow up visit . Has been having some BP variability and some dizzy spells.  He has held his Amlodipine in the past ( early 2016) but we then restarted it at 2.5 mg a day for some high BP readings.   I have reviewed his home BP log. He has had some BP variability ,   Occasional BP of 140s and 150s.   Leaving for [place] on Nov. 8.   ( [name] for 5 days, then a cruise)   He was sick yesterday and had diarrhea all day - did not eat much . He has had some dizziness this am ( and BP is found to be 92/44 today in the office )  Feeling better now.  Feb.  1, 2018:  Had a great visit to [place] several months .  Has been having some HTN issues. Took Losartan 50  mg this am ( instead of 1/2 tablet )  Had a TIA like symptom. Could not speak clearly ,   Started on ASA 81 mg that day   January 26, 2017:  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  Had XRT initially   Will be starting chemo  He has changed his diet - no salt and no canned foods.    Past Medical History:  Diagnosis Date  . Anemia   . Hyperlipidemia   . Hypertension   . Prostate cancer [facility])     Past Surgical History:  Procedure Laterality Date  . CARPAL TUNNEL RELEASE Left   . HERNIA REPAIR     MID 80'S  . KNEE SURGERY Left   . PROSTATECTOMY  2001     Current Outpatient Prescriptions  Medication Sig Dispense Refill  . alendronate (FOSAMAX)  70 MG tablet Take 70 mg by mouth once a week. Take with a full glass of water on an empty stomach.    [name] aspirin EC 81 MG tablet Take 81 mg by mouth daily.    [name] atorvastatin (LIPITOR) 40 MG tablet TAKE 1 TABLET BY MOUTH  DAILY 90 tablet 3  . Cyanocobalamin (B-12) 5000 MCG CAPS Take 1 tablet by mouth daily.    [name] Fish Oil-Cholecalciferol (FISH OIL + D3 PO) Take 1,400 mg by mouth daily.    . fluticasone (FLONASE) 50 MCG/ACT nasal spray Place 2 sprays into both nostrils as needed for allergies or rhinitis.    [name] losartan (COZAAR) 50 MG tablet Take 1 tablet (50 mg total) by mouth 2 (two) times daily. 180 tablet 3  . Lutein-Zeaxanthin 25-5 MG CAPS Take 1 tablet by mouth daily.     . Multiple Vitamin (MULTIVITAMIN) tablet Take 1 tablet by mouth daily. CENTRUM SILVER     No current facility-administered medications for this visit.     Allergies:   Patient has no known allergies.    Social History:  The patient  reports that he quit smoking about 35 years ago. He has never used smokeless tobacco. He reports that he drinks alcohol. He reports that he does not use drugs.   Family History:  The patient's family history includes Alzheimer's disease in his brother; CAD in his father and mother; Dementia in his sister; Heart attack in his sister.    ROS:  Please see the history of present illness.    Review of Systems: Constitutional:  denies fever, chills, diaphoresis, appetite change and fatigue.  HEENT: denies photophobia, eye pain, redness, hearing loss, ear pain, congestion, sore throat, rhinorrhea, sneezing, neck pain, neck stiffness and tinnitus.  Respiratory: denies SOB, DOE, cough, chest tightness, and wheezing.  Cardiovascular: denies chest pain, palpitations and leg swelling.  Gastrointestinal: denies nausea, vomiting, abdominal pain, diarrhea, constipation, blood in stool.  Genitourinary: denies dysuria, urgency, frequency, hematuria, flank pain and difficulty urinating.  Musculoskeletal:  denies  myalgias, back pain, joint swelling, arthralgias and gait problem.   Skin: denies pallor, rash and wound.  Neurological: denies dizziness, seizures, syncope, weakness, light-headedness, numbness and headaches.   Hematological: denies adenopathy, easy bruising, personal or family bleeding history.  Psychiatric/ Behavioral: denies suicidal ideation, mood changes, confusion, nervousness, sleep disturbance and agitation.       All other systems are reviewed and  negative.    PHYSICAL EXAM: VS:  BP 128/76   Pulse 77   Ht 5\' 10"  (1.778 m)   Wt 162 lb (73.5 kg)   SpO2 94%   BMI 23.24 kg/m  , BMI Body mass index is 23.24 kg/m. GEN: Well nourished, well developed, in no acute distress  HEENT: normal  Neck: no JVD, carotid bruits, or masses Cardiac: RRR; 3-2/0 systolic murmur , rubs, or gallops,no edema  Respiratory:  clear to auscultation bilaterally, normal work of breathing GI: soft, nontender, nondistended, + BS MS: no deformity or atrophy  Skin: warm and dry, no rash Neuro:  Strength and sensation are intact Psych: normal   EKG:  EKG is ordered today.   NSR at 73.   RBBB   Recent Labs: 10/11/2016: BUN 17; Creatinine, Ser 1.01; Potassium 4.1; Sodium 135    Lipid Panel    Component Value Date/Time   CHOL 127 07/09/2015 0915   TRIG 109 07/09/2015 0915   HDL 49 07/09/2015 0915   CHOLHDL 2.6 07/09/2015 0915   VLDL 22 07/09/2015 0915   LDLCALC 56 07/09/2015 0915      Wt Readings from Last 3 Encounters:  01/26/17 162 lb (73.5 kg)  10/25/16 156 lb 12.8 oz (71.1 kg)  09/30/16 161 lb (73 kg)    ECG:  .  NSR with PACs.  RBBB.   Other studies Reviewed: Additional studies/ records that were reviewed today include:  Review of the above records demonstrates:    ASSESSMENT AND PLAN:  1.  Essential hypertension:-   He seems to be doing quite a bit better. His blood pressure is much better today. He has been getting lots of elevated readings. We brought him to the  office today. His blood pressure here was quite normal.  He takes his amlodipine 2.5 mg just As needed. His wife checks his blood pressure on a daily basis and he does not take amlodipine if his blood pressures in the 120 range.  2. Aortic sclerosis: He has a soft systolic murmur. Echocardiogram performed in January, 2016  shows calcification of his aortic valve. There is no significant stenosis. He also has mild tricuspid regurgitation.  3. Tricuspid regurgitation: Benign. We'll continue to follow.  4. Hyperlipidemia: Patient has been on on atorvastatin 40 mg a day.   We'll check lipids again in 6 months.   Current medicines are reviewed at length with the patient today.  The patient does not have concerns regarding medicines   Disposition:   FU with me in 1 year       Signed, [name], MD  01/26/2017 10:43 AM    [facility] [address] [place], [place]  [address] Phone: [phone]; Fax: [phone]

## 2017-01-26 NOTE — Addendum Note (Signed)
Addended by: Emmaline Life on: 01/26/2017 12:29 PM   Modules accepted: Orders

## 2017-02-03 DIAGNOSIS — I781 Nevus, non-neoplastic: Secondary | ICD-10-CM | POA: Diagnosis not present

## 2017-02-03 DIAGNOSIS — C8599 Non-Hodgkin lymphoma, unspecified, extranodal and solid organ sites: Secondary | ICD-10-CM | POA: Diagnosis not present

## 2017-02-03 DIAGNOSIS — I10 Essential (primary) hypertension: Secondary | ICD-10-CM | POA: Diagnosis not present

## 2017-02-03 DIAGNOSIS — Z9841 Cataract extraction status, right eye: Secondary | ICD-10-CM | POA: Diagnosis not present

## 2017-02-03 DIAGNOSIS — Z87891 Personal history of nicotine dependence: Secondary | ICD-10-CM | POA: Diagnosis not present

## 2017-02-03 DIAGNOSIS — Z9842 Cataract extraction status, left eye: Secondary | ICD-10-CM | POA: Diagnosis not present

## 2017-02-03 DIAGNOSIS — C884 Extranodal marginal zone B-cell lymphoma of mucosa-associated lymphoid tissue [MALT-lymphoma]: Secondary | ICD-10-CM | POA: Diagnosis not present

## 2017-02-03 DIAGNOSIS — Z961 Presence of intraocular lens: Secondary | ICD-10-CM | POA: Diagnosis not present

## 2017-02-03 DIAGNOSIS — Z7982 Long term (current) use of aspirin: Secondary | ICD-10-CM | POA: Diagnosis not present

## 2017-02-03 DIAGNOSIS — Z79899 Other long term (current) drug therapy: Secondary | ICD-10-CM | POA: Diagnosis not present

## 2017-02-04 DIAGNOSIS — C884 Extranodal marginal zone B-cell lymphoma of mucosa-associated lymphoid tissue [MALT-lymphoma]: Secondary | ICD-10-CM | POA: Diagnosis not present

## 2017-02-11 DIAGNOSIS — C884 Extranodal marginal zone B-cell lymphoma of mucosa-associated lymphoid tissue [MALT-lymphoma]: Secondary | ICD-10-CM | POA: Diagnosis not present

## 2017-02-11 DIAGNOSIS — I10 Essential (primary) hypertension: Secondary | ICD-10-CM | POA: Diagnosis not present

## 2017-02-18 DIAGNOSIS — C884 Extranodal marginal zone B-cell lymphoma of mucosa-associated lymphoid tissue [MALT-lymphoma]: Secondary | ICD-10-CM | POA: Diagnosis not present

## 2017-02-18 DIAGNOSIS — Z79899 Other long term (current) drug therapy: Secondary | ICD-10-CM | POA: Diagnosis not present

## 2017-02-18 DIAGNOSIS — Z923 Personal history of irradiation: Secondary | ICD-10-CM | POA: Diagnosis not present

## 2017-02-18 DIAGNOSIS — Z7982 Long term (current) use of aspirin: Secondary | ICD-10-CM | POA: Diagnosis not present

## 2017-02-18 DIAGNOSIS — I10 Essential (primary) hypertension: Secondary | ICD-10-CM | POA: Diagnosis not present

## 2017-02-18 DIAGNOSIS — Z5112 Encounter for antineoplastic immunotherapy: Secondary | ICD-10-CM | POA: Diagnosis not present

## 2017-02-25 DIAGNOSIS — C884 Extranodal marginal zone B-cell lymphoma of mucosa-associated lymphoid tissue [MALT-lymphoma]: Secondary | ICD-10-CM | POA: Diagnosis not present

## 2017-03-01 DIAGNOSIS — D485 Neoplasm of uncertain behavior of skin: Secondary | ICD-10-CM | POA: Diagnosis not present

## 2017-03-01 DIAGNOSIS — L570 Actinic keratosis: Secondary | ICD-10-CM | POA: Diagnosis not present

## 2017-03-01 DIAGNOSIS — C44222 Squamous cell carcinoma of skin of right ear and external auricular canal: Secondary | ICD-10-CM | POA: Diagnosis not present

## 2017-03-07 ENCOUNTER — Telehealth: Payer: Self-pay | Admitting: Cardiovascular Disease

## 2017-03-07 NOTE — Telephone Encounter (Signed)
Mrs.Fuentes is calling because [name] is very dizzy and she thinks that his blood pressure is running like 110/60. Please call

## 2017-03-07 NOTE — Telephone Encounter (Signed)
While this BP is normal for most patients, at his age, this might be a bit too aggressive for [name]. He can reduce his dose by 1/2 and continue to recheck

## 2017-03-07 NOTE — Telephone Encounter (Signed)
Please advise, patient is on losartan daily, BP is running 110/60 with dizziness, feeling faint, and no energy. Patient states that he stopped taking amlodipine about a month ago as it was only an PRN bases. Please advise on what patient needs to do.

## 2017-03-07 NOTE — Telephone Encounter (Signed)
Pt's wife confirmed current losartan dose is 50 mg bid. Pt's wife advised Dr Acie Fredrickson recommends cut current losartan dose by 1/2 so pt will take 1/2 of a 50 mg (25 mg) tablet bid, continue to monitor BP and symptoms.

## 2017-03-10 ENCOUNTER — Telehealth: Payer: Self-pay | Admitting: Cardiology

## 2017-03-10 ENCOUNTER — Emergency Department (HOSPITAL_COMMUNITY)
Admission: EM | Admit: 2017-03-10 | Discharge: 2017-03-10 | Disposition: A | Discharge status: Home / Self Care | Payer: Medicare Other | Attending: Emergency Medicine | Admitting: Emergency Medicine

## 2017-03-10 ENCOUNTER — Encounter (HOSPITAL_COMMUNITY): Payer: Self-pay | Admitting: Emergency Medicine

## 2017-03-10 DIAGNOSIS — I10 Essential (primary) hypertension: Secondary | ICD-10-CM | POA: Diagnosis not present

## 2017-03-10 DIAGNOSIS — Z79899 Other long term (current) drug therapy: Secondary | ICD-10-CM | POA: Insufficient documentation

## 2017-03-10 DIAGNOSIS — Z7982 Long term (current) use of aspirin: Secondary | ICD-10-CM | POA: Insufficient documentation

## 2017-03-10 DIAGNOSIS — Z87891 Personal history of nicotine dependence: Secondary | ICD-10-CM | POA: Insufficient documentation

## 2017-03-10 DIAGNOSIS — Z8673 Personal history of transient ischemic attack (TIA), and cerebral infarction without residual deficits: Secondary | ICD-10-CM | POA: Diagnosis not present

## 2017-03-10 DIAGNOSIS — E785 Hyperlipidemia, unspecified: Secondary | ICD-10-CM | POA: Diagnosis not present

## 2017-03-10 DIAGNOSIS — Z8546 Personal history of malignant neoplasm of prostate: Secondary | ICD-10-CM | POA: Diagnosis not present

## 2017-03-10 DIAGNOSIS — R030 Elevated blood-pressure reading, without diagnosis of hypertension: Secondary | ICD-10-CM | POA: Diagnosis not present

## 2017-03-10 NOTE — ED Provider Notes (Signed)
[facility] DEPT Provider Note   CSN: [id number] Arrival date & time: 03/10/17  2101  By signing my name below, I, Reola Mosher, attest that this documentation has been prepared under the direction and in the presence of Virgel Manifold, MD. Electronically Signed: Reola Mosher, ED Scribe. 03/10/17. 10:00 PM.  History   Chief Complaint Chief Complaint  Patient presents with  . Hypertension   The history is provided by the patient. No language interpreter was used.    HPI Comments: Julian [name] is a 83 y.o. male BIB EMS, with a PMHx of anemia, HLD, HTN, TIA, CAD, prostate cancer, who presents to the Emergency Department complaining of persistent elevated blood pressures beginning several weeks ago, acutely worsening over the past several days. Pt reports that he has been taking his pressures at home recently, with systolic readings into the 230s. Pt states that he feels somewhat sluggish and has had an increase in fatigue recently; however, he has otherwise felt at his baseline. Pt last took a dose of his HTN medications this morning. He usually takes his medications every morning and evening. He notes that his PCP has been attempting to wean him off of his dose of Norvasc and place him onto Valsartan. Of note, he is currently undergoing treatment for ocular cancer, which his wife notes that since last week from his most recent radiation session that his pressures have been acutely worsened. He denies chest pain, headache, syncope, or any other associated symptoms.   Past Medical History:  Diagnosis Date  . Anemia   . Hyperlipidemia   . Hypertension   . Prostate cancer [facility])    Patient Active Problem List   Diagnosis Date Noted  . Hyperlipidemia 01/26/2017  . TIA (transient ischemic attack) 09/23/2016  . Carotid artery disease ([place]) 10/27/2015  . HTN (hypertension) 10/23/2014  . 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 Syncope 08/29/2014   Past Surgical  History:  Procedure Laterality Date  . CARPAL TUNNEL RELEASE Left   . HERNIA REPAIR     MID 80'S  . KNEE SURGERY Left   . PROSTATECTOMY  2001    Home Medications    Prior to Admission medications   Medication Sig Start Date End Date Taking? Authorizing Provider  alendronate (FOSAMAX) 70 MG tablet Take 70 mg by mouth once a week. Take with a full glass of water on an empty stomach.    [provider]  amLODipine (NORVASC) 2.5 MG tablet Take 1 tablet (2.5 mg total) by mouth daily. 01/26/17 04/26/17  Nahser, Wonda Cheng, MD  aspirin EC 81 MG tablet Take 81 mg by mouth daily.    [provider]  atorvastatin (LIPITOR) 40 MG tablet TAKE 1 TABLET BY MOUTH  DAILY 09/15/16   Nahser, Wonda Cheng, MD  Cyanocobalamin (B-12) 5000 MCG CAPS Take 1 tablet by mouth daily.    [provider]  Fish Oil-Cholecalciferol (FISH OIL + D3 PO) Take 1,400 mg by mouth daily.    [provider]  fluticasone (FLONASE) 50 MCG/ACT nasal spray Place 2 sprays into both nostrils as needed for allergies or rhinitis.    [provider]  losartan (COZAAR) 50 MG tablet Take 1/2 tablet (25 mg)  by mouth two times daily 03/07/17   Nahser, Wonda Cheng, MD  Lutein-Zeaxanthin 25-5 MG CAPS Take 1 tablet by mouth daily.     [provider]  Multiple Vitamin (MULTIVITAMIN) tablet Take 1 tablet by mouth daily. CENTRUM SILVER    [provider]   Family History Family History  Problem Relation Age of Onset  . CAD Mother   . CAD Father   . Dementia Sister   . Heart attack Sister   . Alzheimer's disease Brother    Social History Social History  Substance Use Topics  . Smoking status: Former Smoker    Quit date: 09/30/1981  . Smokeless tobacco: Never Used  . Alcohol use Yes     Comment: 5 oz a week   Allergies   Patient has no known allergies.  Review of Systems Review of Systems  Constitutional: Positive for fatigue.  Cardiovascular: Negative for chest pain.    Neurological: Negative for syncope and headaches.  All other systems reviewed and are negative.  Physical Exam Updated Vital Signs BP (!) 183/79 (BP Location: Left Arm)   Pulse 74   Temp 98.8 F (37.1 C) (Oral)   Resp 16   Ht 5\' 10"  (1.778 m)   Wt 150 lb (68 kg)   SpO2 98%   BMI 21.52 kg/m   Physical Exam  Constitutional: He appears well-developed and well-nourished.  HENT:  Head: Normocephalic.  Right Ear: External ear normal.  Left Ear: External ear normal.  Nose: Nose normal.  Eyes: Conjunctivae are normal. Right eye exhibits no discharge. Left eye exhibits no discharge.  Neck: Normal range of motion.  Cardiovascular: Normal rate and regular rhythm.   Murmur heard.  Systolic murmur is present  Pulmonary/Chest: Effort normal and breath sounds normal. No respiratory distress. He has no wheezes. He has no rales.  Abdominal: Soft. There is no tenderness. There is no rebound and no guarding.  Musculoskeletal: Normal range of motion. He exhibits no edema or tenderness.  Neurological: He is alert. No cranial nerve deficit. Coordination normal.  Skin: Skin is warm and dry. No rash noted. No erythema. No pallor.  Psychiatric: He has a normal mood and affect. His behavior is normal.  Nursing note and vitals reviewed.  ED Treatments / Results  DIAGNOSTIC STUDIES: Oxygen Saturation is 98% on RA, normal by my interpretation.   COORDINATION OF CARE: 10:00 PM-Discussed next steps with pt. Pt verbalized understanding and is agreeable with the plan.   Labs (all labs ordered are listed, but only abnormal results are displayed) Labs Reviewed - No data to display  EKG  EKG Interpretation None      Radiology No results found.  Procedures Procedures   Medications Ordered in ED Medications - No data to display  Initial Impression / Assessment and Plan / ED Course  I have reviewed the triage vital signs and the nursing notes.  Pertinent labs & imaging results that were  available during my care of the patient were reviewed by me and considered in my medical decision making (see chart for details).     60yM with hypertension. He looks great for his age. He and significant other are concerned about his blood pressure, but he is asymptomatic.   Discussed why we treat blood pressure and that the damage from chronic hypertension occurs along a timeline of years and not days or weeks. At his age there probably isn't a need to be very aggressive in treating it particularly if he was getting symptomatic running at lower pressures. I think the most appropriate action is to discuss further with his PCP. They may want to further adjust his meds again or possibly have something PRN.     They keep a pretty detailed log which is great. Advised them to  keep doing this but not to get too caught up with the individual readings as long as he is feeling well. Take this with him on his next appointment. Emergent return precautions discussed.   Final Clinical Impressions(s) / ED Diagnoses   Final diagnoses:  Hypertension, unspecified type   New Prescriptions New Prescriptions   No medications on file   I personally preformed the services scribed in my presence. The recorded information has been reviewed is accurate. Virgel Manifold, MD.    Virgel Manifold, MD 03/19/17 [id number]

## 2017-03-10 NOTE — ED Triage Notes (Signed)
Pt has history of HTN. PCP has been weaning patient off norvasc and placed on valsartan. Pt checked his BP at home was 230s over something. Pt has been receiving treatment for ocular cancer. Pt is asymptomatic minus increased fatigue, lethargy, and weight loss.

## 2017-03-10 NOTE — Telephone Encounter (Signed)
Patient's wife called wanting to notify Dr. Acie Fredrickson that her husband is in [name] with HTN

## 2017-03-11 ENCOUNTER — Telehealth: Payer: Self-pay | Admitting: Cardiovascular Disease

## 2017-03-11 NOTE — Telephone Encounter (Signed)
New message    Pt wife calling, she wants him to see Dr. Acie Fredrickson - no PA or anyone else. He needs to follow up immediately to ER when he went last night. Pt wife requests a call back to be fit in the schedule.

## 2017-03-11 NOTE — Telephone Encounter (Signed)
I spoke with pt's wife who reports pt was in ED last night with elevated BP. Losartan was adjusted.  Wife is requesting appointment with Dr.Nahser regarding BP management. She reports primary care does not manage his BP.  I told her Dr. Acie Fredrickson was not back in the office until mid August.  She does not want to see PA or NP. I told her I would send message to Dr. Elmarie Shiley nurse about possible appointment time.

## 2017-03-16 NOTE — Telephone Encounter (Signed)
Left message for patient's wife to call back.  

## 2017-03-16 NOTE — Telephone Encounter (Signed)
Spoke with patient's wife who states BP has been fluctuating since patient finished chemotherapy. She states at times patient's systolic BP is >546 mmHg and at other times it is low 100's mmHg and he gets very dizzy. She states he takes losartan 25 mg twice daily and amlodipine 2.5 mg daily as needed only for elevated BP. She states she does not know an average reading because it fluctuates a great deal.  We discussed the fact that some of the medications given during chemo could cause fluctuations in the BP. I advised patient's wife to monitor BP only 2 times per day, 30 minutes after morning meds and then again in the evening at least 30 min after dinner. I advised her to call back with these readings to determine when an appointment is needed. Patient's wife verbalized understanding and agreement and was very grateful for the call.

## 2017-03-16 NOTE — Telephone Encounter (Signed)
Follow up    Pt wife is calling back.

## 2017-03-16 NOTE — Telephone Encounter (Signed)
Follow up    Pt wife is returning call to RN.

## 2017-03-16 NOTE — Telephone Encounter (Signed)
Left message for patients wife to call the office

## 2017-03-28 ENCOUNTER — Telehealth: Payer: Self-pay | Admitting: Cardiovascular Disease

## 2017-03-28 MED ORDER — LOSARTAN POTASSIUM 50 MG PO TABS: 50.0000 mg | ORAL_TABLET | Freq: Two times a day (BID) | ORAL | 3 refills | Status: DC

## 2017-03-28 MED ORDER — LOSARTAN POTASSIUM 50 MG PO TABS: 50.0000 mg | ORAL_TABLET | Freq: Two times a day (BID) | ORAL | 0 refills | Status: DC

## 2017-03-28 NOTE — Telephone Encounter (Signed)
New Message   *STAT* If patient is at the pharmacy, call can be transferred to refill team.   1. Which medications need to be refilled? (please list name of each medication and dose if known) Losartan 50mg  am 50mg  pm  2. Which pharmacy/location (including street and city if local pharmacy) is medication to be sent to? [facility]   3. Do they need a 30 day or 90 day supply? 90  Pt is out of medication this evening. Please call back to discuss

## 2017-03-29 DIAGNOSIS — I781 Nevus, non-neoplastic: Secondary | ICD-10-CM | POA: Diagnosis not present

## 2017-03-29 DIAGNOSIS — Z87891 Personal history of nicotine dependence: Secondary | ICD-10-CM | POA: Diagnosis not present

## 2017-03-29 DIAGNOSIS — Z961 Presence of intraocular lens: Secondary | ICD-10-CM | POA: Diagnosis not present

## 2017-03-29 DIAGNOSIS — Z9221 Personal history of antineoplastic chemotherapy: Secondary | ICD-10-CM | POA: Diagnosis not present

## 2017-03-29 DIAGNOSIS — Z79899 Other long term (current) drug therapy: Secondary | ICD-10-CM | POA: Diagnosis not present

## 2017-03-29 DIAGNOSIS — Z9841 Cataract extraction status, right eye: Secondary | ICD-10-CM | POA: Diagnosis not present

## 2017-03-29 DIAGNOSIS — Z9842 Cataract extraction status, left eye: Secondary | ICD-10-CM | POA: Diagnosis not present

## 2017-03-29 DIAGNOSIS — I10 Essential (primary) hypertension: Secondary | ICD-10-CM | POA: Diagnosis not present

## 2017-03-29 DIAGNOSIS — Z8669 Personal history of other diseases of the nervous system and sense organs: Secondary | ICD-10-CM | POA: Diagnosis not present

## 2017-03-29 DIAGNOSIS — Z7982 Long term (current) use of aspirin: Secondary | ICD-10-CM | POA: Diagnosis not present

## 2017-03-29 DIAGNOSIS — C8599 Non-Hodgkin lymphoma, unspecified, extranodal and solid organ sites: Secondary | ICD-10-CM | POA: Diagnosis not present

## 2017-03-29 DIAGNOSIS — C884 Extranodal marginal zone B-cell lymphoma of mucosa-associated lymphoid tissue [MALT-lymphoma]: Secondary | ICD-10-CM | POA: Diagnosis not present

## 2017-04-01 DIAGNOSIS — C884 Extranodal marginal zone B-cell lymphoma of mucosa-associated lymphoid tissue [MALT-lymphoma]: Secondary | ICD-10-CM | POA: Diagnosis not present

## 2017-04-04 ENCOUNTER — Telehealth: Payer: Self-pay | Admitting: Cardiovascular Disease

## 2017-04-04 NOTE — Telephone Encounter (Signed)
New message      Pt c/o BP issue: STAT if pt c/o blurred vision, one-sided weakness or slurred speech  1. What are your last 5 BP readings?  110/63, 105/60  2. Are you having any other symptoms (ex. Dizziness, headache, blurred vision, passed out)?  weakness  3. What is your BP issue?  Bp is too low.  However, pt was in hosp approx 2 weeks ago because bp was too high

## 2017-04-04 NOTE — Telephone Encounter (Signed)
Left pt a message to call back. 

## 2017-04-05 DIAGNOSIS — C8599 Non-Hodgkin lymphoma, unspecified, extranodal and solid organ sites: Secondary | ICD-10-CM | POA: Diagnosis not present

## 2017-04-05 MED ORDER — LOSARTAN POTASSIUM 50 MG PO TABS: 25.0000 mg | ORAL_TABLET | Freq: Every day | ORAL | 3 refills | Status: DC

## 2017-04-05 NOTE — Telephone Encounter (Signed)
Patient wife calling, states that she is returning your call. Patient wife states that you may leave message.

## 2017-04-05 NOTE — Telephone Encounter (Signed)
Left message for patient to call back  

## 2017-04-05 NOTE — Telephone Encounter (Signed)
Spoke with patient's wife who reports that patient is weak due to low BP. She states he is having systolic readings of 323-557 mmHg frequently in the mornings after taking Losartan 50 mg. He is not currently taking amlodipine 2.5 mg. She states he checks his BP when he first wakes up and BP is elevated so he immediately takes the Losartan 50 mg and then eats breakfast; BP a few hours later will be low. I advised her to have the patient decrease the losartan to 25 mg in the morning and then to check BP at least 30 minutes after taking medication and eating breakfast. I advised her to recheck BP later in the day before taking any additional losartan. I advised her to continue to monitor and to call back for additional advice prior to f/u visit with Dr. Acie Fredrickson in September. She verbalized understanding and agreement and thanked me for the call.

## 2017-04-06 DIAGNOSIS — L570 Actinic keratosis: Secondary | ICD-10-CM | POA: Diagnosis not present

## 2017-04-06 DIAGNOSIS — D1801 Hemangioma of skin and subcutaneous tissue: Secondary | ICD-10-CM | POA: Diagnosis not present

## 2017-04-06 DIAGNOSIS — L814 Other melanin hyperpigmentation: Secondary | ICD-10-CM | POA: Diagnosis not present

## 2017-04-06 DIAGNOSIS — Z85828 Personal history of other malignant neoplasm of skin: Secondary | ICD-10-CM | POA: Diagnosis not present

## 2017-04-06 DIAGNOSIS — L821 Other seborrheic keratosis: Secondary | ICD-10-CM | POA: Diagnosis not present

## 2017-04-11 DIAGNOSIS — K6389 Other specified diseases of intestine: Secondary | ICD-10-CM | POA: Diagnosis not present

## 2017-04-11 DIAGNOSIS — C884 Extranodal marginal zone B-cell lymphoma of mucosa-associated lymphoid tissue [MALT-lymphoma]: Secondary | ICD-10-CM | POA: Diagnosis not present

## 2017-04-13 DIAGNOSIS — Z9221 Personal history of antineoplastic chemotherapy: Secondary | ICD-10-CM | POA: Diagnosis not present

## 2017-04-13 DIAGNOSIS — Z6822 Body mass index (BMI) 22.0-22.9, adult: Secondary | ICD-10-CM | POA: Diagnosis not present

## 2017-04-13 DIAGNOSIS — C884 Extranodal marginal zone B-cell lymphoma of mucosa-associated lymphoid tissue [MALT-lymphoma]: Secondary | ICD-10-CM | POA: Diagnosis not present

## 2017-04-13 DIAGNOSIS — Z87891 Personal history of nicotine dependence: Secondary | ICD-10-CM | POA: Diagnosis not present

## 2017-04-13 DIAGNOSIS — R634 Abnormal weight loss: Secondary | ICD-10-CM | POA: Diagnosis not present

## 2017-04-14 DIAGNOSIS — C44222 Squamous cell carcinoma of skin of right ear and external auricular canal: Secondary | ICD-10-CM | POA: Diagnosis not present

## 2017-04-15 DIAGNOSIS — C884 Extranodal marginal zone B-cell lymphoma of mucosa-associated lymphoid tissue [MALT-lymphoma]: Secondary | ICD-10-CM | POA: Diagnosis not present

## 2017-04-15 DIAGNOSIS — Z23 Encounter for immunization: Secondary | ICD-10-CM | POA: Diagnosis not present

## 2017-04-26 ENCOUNTER — Encounter: Payer: Self-pay | Admitting: Cardiovascular Disease

## 2017-04-26 ENCOUNTER — Ambulatory Visit (INDEPENDENT_AMBULATORY_CARE_PROVIDER_SITE_OTHER): Payer: Medicare Other | Admitting: Cardiovascular Disease

## 2017-04-26 VITALS — BP 150/74 | HR 60 | Ht 70.0 in | Wt 152.0 lb

## 2017-04-26 DIAGNOSIS — I10 Essential (primary) hypertension: Secondary | ICD-10-CM | POA: Diagnosis not present

## 2017-04-26 MED ORDER — OLMESARTAN MEDOXOMIL 20 MG PO TABS: 20.0000 mg | ORAL_TABLET | Freq: Every day | ORAL | 3 refills | Status: DC

## 2017-04-26 NOTE — Patient Instructions (Signed)
Medication Instructions:  STOP Losartan START Olmesartan 20 mg once daily   Labwork: Your physician recommends that you return for lab work in: 3 weeks for basic metabolic panel    Testing/Procedures: None Ordered   Follow-Up: Your physician recommends that you schedule a follow-up appointment in: 3 months with Dr. Acie Fredrickson.    If you need a refill on your cardiac medications before your next appointment, please call your pharmacy.   Thank you for choosing [facility]! Christen Bame, RN [phone]

## 2017-04-26 NOTE — Progress Notes (Signed)
Cardiology Office Note   Date:  04/26/2017   ID:  Julian [name], DOB 1926/02/01, MRN [id number]  PCP:  [name], MD  Cardiologist:   [name], MD   Chief Complaint  Patient presents with  . Follow-up    HTN   1. Hypertension  Julian [name] is an 83 yo , hx of HTN. Works out regularly. Does the elliptical without any difficulty. Has had some mild episodes of faint feeling. Occurred during Exercising. Occasionally has orthostatic hypotension. A month ago. He was driving and felt a bit faint. Thought that he might need to pull over to the side of the road. Drinks water while exercising,  Episodes are not always related to exercise  6-7 years ago he had an episode of very high BP. He was started on BP meds at that time. Resolved after several hours,   Non smoker ETOH 2 drinks a day Fhx: +, mother died at 43, father died age 93 , sister died of complications related to alzheimers. , brother had valve replacement. Retired from [employer].  Moved from [place], [place] several years ago. [place].    10/23/2014: Julian [name] is a 83 y.o. male who presents for follow-up for his high blood pressure.  we decreased the amlodipine due to some orthostatic hypotension. BP has been ok at home.   Eats well most of the time.  Occasionally gets a bit of extra salt.   Still very active.    April 22, 2015:  still having some orthostatic hypotension.  BP is normal at  Has had some occasional palpitations  - also sees it registers on his BP cuff. Gives an error message .  His amlodipine dose was reduced about a year ago .  Still has episodes of dizziness - only lasts about 2-3 seconds  These episodes can occur while seated - and even had occurred while driving  Do not occur when he has been exercising .  Does the elliptical machine for 40 minutes without any CP or dyspnea, no dizziness  Then works out with [employer].   07/09/2015:  Julian [name] is  seen today for variable BP.   He had some orthostatic hypotension symptoms ,  We had stopped his amlodipine.  BP increased slightly .   We started back Amlodipine at 2. 5 mg a day  He brought his blood pressure log with him today. Most of his readings are mildly to moderately elevated.  October 27, 2015  Julian [name] is doing well.  Brought his BP log. Most of his readings are normal .  No CP or dyspnea.   No dizziness since we  Adjusted his amlodipine therapy Is through with his XRT therapy   Oct. 27, 2017:  Julian [name] is seen for follow up visit . Has been having some BP variability and some dizzy spells.  He has held his Amlodipine in the past ( early 2016) but we then restarted it at 2.5 mg a day for some high BP readings.   I have reviewed his home BP log. He has had some BP variability ,   Occasional BP of 140s and 150s.   Leaving for [place] on Nov. 8.   ( [name] for 5 days, then a cruise)   He was sick yesterday and had diarrhea all day - did not eat much . He has had some dizziness this am ( and BP is found to be 92/44 today in the office )  Feeling  better now.   Feb.  1, 2018:  Had a great visit to [place] several months .  Has been having some HTN issues. Took Losartan 50  mg this am ( instead of 1/2 tablet )  Had a TIA like symptom. 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  Had XRT initially   Will be starting chemo  He has changed his diet - no salt and no canned foods.   Sept, 4, 2018:   Julian [name] is seen today  Brought his BP log. Many normal readings BP is generally a bit higher than he would like   Past Medical History:  Diagnosis Date  . Anemia   . Hyperlipidemia   . Hypertension   . Prostate cancer [facility])     Past Surgical History:  Procedure Laterality Date  . CARPAL TUNNEL RELEASE Left   . HERNIA REPAIR     MID 80'S  .  KNEE SURGERY Left   . PROSTATECTOMY  2001     Current Outpatient Prescriptions  Medication Sig Dispense Refill  . alendronate (FOSAMAX) 70 MG tablet Take 70 mg by mouth once a week. Take with a full glass of water on an empty stomach.    [name] amLODipine (NORVASC) 2.5 MG tablet Take 2.5 mg by mouth daily as needed (elevated BP).    [name] aspirin EC 81 MG tablet Take 81 mg by mouth daily.    [name] atorvastatin (LIPITOR) 40 MG tablet TAKE 1 TABLET BY MOUTH  DAILY 90 tablet 3  . Cyanocobalamin (B-12) 5000 MCG CAPS Take 1 tablet by mouth daily.    [name] Fish Oil-Cholecalciferol (FISH OIL + D3 PO) Take 1,400 mg by mouth daily.    . fluticasone (FLONASE) 50 MCG/ACT nasal spray Place 2 sprays into both nostrils as needed for allergies or rhinitis.    [name] losartan (COZAAR) 50 MG tablet Take 50 mg by mouth 2 (two) times daily.    . Lutein-Zeaxanthin 25-5 MG CAPS Take 1 tablet by mouth daily.     . Multiple Vitamin (MULTIVITAMIN) tablet Take 1 tablet by mouth daily. CENTRUM SILVER     No current facility-administered medications for this visit.     Allergies:   Patient has no known allergies.    Social History:  The patient  reports that he quit smoking about 35 years ago. He has never used smokeless tobacco. He reports that he drinks alcohol. He reports that he does not use drugs.   Family History:  The patient's family history includes Alzheimer's disease in his brother; CAD in his father and mother; Dementia in his sister; Heart attack in his sister.    ROS:  Please see the history of present illness.    Review of Systems: Constitutional:  denies fever, chills, diaphoresis, appetite change and fatigue.  HEENT: denies photophobia, eye pain, redness, hearing loss, ear pain, congestion, sore throat, rhinorrhea, sneezing, neck pain, neck stiffness and tinnitus.  Respiratory: denies SOB, DOE, cough, chest tightness, and wheezing.  Cardiovascular: denies chest pain, palpitations and leg swelling.    Gastrointestinal: denies nausea, vomiting, abdominal pain, diarrhea, constipation, blood in stool.  Genitourinary: denies dysuria, urgency, frequency, hematuria, flank pain and difficulty urinating.  Musculoskeletal: denies  myalgias, back pain, joint swelling, arthralgias and gait problem.   Skin: denies pallor, rash and wound.  Neurological: denies dizziness, seizures, syncope, weakness, light-headedness, numbness and headaches.   Hematological: denies adenopathy, easy bruising, personal or family bleeding history.  Psychiatric/ Behavioral: denies suicidal ideation, mood changes, confusion, nervousness, sleep disturbance and agitation.       All other systems are reviewed and negative.    PHYSICAL EXAM: VS:  BP (!) 150/74   Pulse 60   Ht 5\' 10"  (1.778 m)   Wt 152 lb (68.9 kg)   BMI 21.81 kg/m  , BMI Body mass index is 21.81 kg/m. GEN: Well nourished, well developed, in no acute distress  HEENT: normal  Neck: no JVD, carotid bruits, or masses Cardiac: RRR; 2-7/7 systolic murmur , rubs, or gallops,no edema  Respiratory:  clear to auscultation bilaterally, normal work of breathing GI: soft, nontender, nondistended, + BS MS: no deformity or atrophy  Skin: warm and dry, no rash Neuro:  Strength and sensation are intact Psych: normal   EKG:      Recent Labs: 10/11/2016: BUN 17; Creatinine, Ser 1.01; Potassium 4.1; Sodium 135    Lipid Panel    Component Value Date/Time   CHOL 127 07/09/2015 0915   TRIG 109 07/09/2015 0915   HDL 49 07/09/2015 0915   CHOLHDL 2.6 07/09/2015 0915   VLDL 22 07/09/2015 0915   LDLCALC 56 07/09/2015 0915      Wt Readings from Last 3 Encounters:  04/26/17 152 lb (68.9 kg)  03/10/17 150 lb (68 kg)  01/26/17 162 lb (73.5 kg)    ECG:  .  NSR with PACs.  RBBB.   Other studies Reviewed: Additional studies/ records that were reviewed today include:  Review of the above records demonstrates:    ASSESSMENT AND PLAN:  1.  Essential  hypertension:-   He seems to be doing quite a bit better. His blood pressure is much better today.  His blood pressures are occasionally normal and occasionally elevated. He takes amlodipine on an as-needed basis. In an effort to smooth out his blood pressure readings, we will discontinue the losartan and start him on Olmasartan  20 mg a day. We'll see him back in 3 weeks for basic metabolic profile.      .  2. Aortic sclerosis: He has a soft systolic murmur. Echocardiogram performed in January, 2016  shows calcification of his aortic valve. There is no significant stenosis. He also has mild tricuspid regurgitation.  3. Tricuspid regurgitation: Benign. We'll continue to follow.  4. Hyperlipidemia: Patient has been on on atorvastatin 40 mg a day.   We'll check lipids again in 6 months.   Current medicines are reviewed at length with the patient today.  The patient does not have concerns regarding medicines   Disposition:   FU with me in 1 year       Signed, [name], MD  04/26/2017 3:04 PM    [facility] [address], [place], [place]  [address] Phone: [phone]; Fax: [phone]

## 2017-04-27 DIAGNOSIS — D485 Neoplasm of uncertain behavior of skin: Secondary | ICD-10-CM | POA: Diagnosis not present

## 2017-04-27 DIAGNOSIS — C44311 Basal cell carcinoma of skin of nose: Secondary | ICD-10-CM | POA: Diagnosis not present

## 2017-04-27 DIAGNOSIS — C44319 Basal cell carcinoma of skin of other parts of face: Secondary | ICD-10-CM | POA: Diagnosis not present

## 2017-04-27 DIAGNOSIS — S40862A Insect bite (nonvenomous) of left upper arm, initial encounter: Secondary | ICD-10-CM | POA: Diagnosis not present

## 2017-04-27 DIAGNOSIS — W57XXXA Bitten or stung by nonvenomous insect and other nonvenomous arthropods, initial encounter: Secondary | ICD-10-CM | POA: Diagnosis not present

## 2017-05-13 DIAGNOSIS — K639 Disease of intestine, unspecified: Secondary | ICD-10-CM | POA: Diagnosis not present

## 2017-05-13 DIAGNOSIS — C884 Extranodal marginal zone B-cell lymphoma of mucosa-associated lymphoid tissue [MALT-lymphoma]: Secondary | ICD-10-CM | POA: Diagnosis not present

## 2017-05-17 ENCOUNTER — Other Ambulatory Visit: Payer: Medicare Other

## 2017-05-17 DIAGNOSIS — I10 Essential (primary) hypertension: Secondary | ICD-10-CM

## 2017-05-17 LAB — BASIC METABOLIC PANEL
BUN / CREAT RATIO: 13 (ref 10–24)
BUN: 12 mg/dL (ref 8–27)
CALCIUM: 9.1 mg/dL (ref 8.6–10.2)
CHLORIDE: 100 mmol/L (ref 96–106)
CO2: 25 mmol/L (ref 20–29)
Creatinine, Ser: 0.91 mg/dL (ref 0.76–1.27)
GFR, EST AFRICAN AMERICAN: 86 mL/min/{1.73_m2} (ref >59)
GFR, EST NON AFRICAN AMERICAN: 74 mL/min/{1.73_m2} (ref >59)
Glucose: 102 mg/dL — ABNORMAL HIGH (ref 65–99)
Potassium: 4.5 mmol/L (ref 3.5–5.2)
Sodium: 139 mmol/L (ref 134–144)

## 2017-05-18 ENCOUNTER — Other Ambulatory Visit: Payer: Self-pay | Admitting: Cardiovascular Disease

## 2017-06-01 DIAGNOSIS — H0102B Squamous blepharitis left eye, upper and lower eyelids: Secondary | ICD-10-CM | POA: Diagnosis not present

## 2017-06-01 DIAGNOSIS — I10 Essential (primary) hypertension: Secondary | ICD-10-CM | POA: Diagnosis not present

## 2017-06-01 DIAGNOSIS — Z7982 Long term (current) use of aspirin: Secondary | ICD-10-CM | POA: Diagnosis not present

## 2017-06-01 DIAGNOSIS — Z87891 Personal history of nicotine dependence: Secondary | ICD-10-CM | POA: Diagnosis not present

## 2017-06-01 DIAGNOSIS — C884 Extranodal marginal zone B-cell lymphoma of mucosa-associated lymphoid tissue [MALT-lymphoma]: Secondary | ICD-10-CM | POA: Diagnosis not present

## 2017-06-01 DIAGNOSIS — Z9842 Cataract extraction status, left eye: Secondary | ICD-10-CM | POA: Diagnosis not present

## 2017-06-01 DIAGNOSIS — H119 Unspecified disorder of conjunctiva: Secondary | ICD-10-CM | POA: Diagnosis not present

## 2017-06-01 DIAGNOSIS — R234 Changes in skin texture: Secondary | ICD-10-CM | POA: Diagnosis not present

## 2017-06-01 DIAGNOSIS — Z961 Presence of intraocular lens: Secondary | ICD-10-CM | POA: Diagnosis not present

## 2017-06-01 DIAGNOSIS — H0102A Squamous blepharitis right eye, upper and lower eyelids: Secondary | ICD-10-CM | POA: Diagnosis not present

## 2017-06-01 DIAGNOSIS — Z923 Personal history of irradiation: Secondary | ICD-10-CM | POA: Diagnosis not present

## 2017-06-01 DIAGNOSIS — Z9841 Cataract extraction status, right eye: Secondary | ICD-10-CM | POA: Diagnosis not present

## 2017-06-01 DIAGNOSIS — Z79899 Other long term (current) drug therapy: Secondary | ICD-10-CM | POA: Diagnosis not present

## 2017-06-02 DIAGNOSIS — R197 Diarrhea, unspecified: Secondary | ICD-10-CM | POA: Diagnosis not present

## 2017-06-02 DIAGNOSIS — I10 Essential (primary) hypertension: Secondary | ICD-10-CM | POA: Diagnosis not present

## 2017-06-02 DIAGNOSIS — Z87891 Personal history of nicotine dependence: Secondary | ICD-10-CM | POA: Diagnosis not present

## 2017-06-02 DIAGNOSIS — Z79899 Other long term (current) drug therapy: Secondary | ICD-10-CM | POA: Diagnosis not present

## 2017-06-02 DIAGNOSIS — Z7982 Long term (current) use of aspirin: Secondary | ICD-10-CM | POA: Diagnosis not present

## 2017-06-02 DIAGNOSIS — Z8572 Personal history of non-Hodgkin lymphomas: Secondary | ICD-10-CM | POA: Diagnosis not present

## 2017-06-02 DIAGNOSIS — Z8546 Personal history of malignant neoplasm of prostate: Secondary | ICD-10-CM | POA: Diagnosis not present

## 2017-06-10 DIAGNOSIS — N492 Inflammatory disorders of scrotum: Secondary | ICD-10-CM | POA: Diagnosis not present

## 2017-06-10 DIAGNOSIS — C884 Extranodal marginal zone B-cell lymphoma of mucosa-associated lymphoid tissue [MALT-lymphoma]: Secondary | ICD-10-CM | POA: Diagnosis not present

## 2017-06-20 DIAGNOSIS — H119 Unspecified disorder of conjunctiva: Secondary | ICD-10-CM | POA: Diagnosis not present

## 2017-06-20 DIAGNOSIS — C8599 Non-Hodgkin lymphoma, unspecified, extranodal and solid organ sites: Secondary | ICD-10-CM | POA: Diagnosis not present

## 2017-06-20 DIAGNOSIS — Z87891 Personal history of nicotine dependence: Secondary | ICD-10-CM | POA: Diagnosis not present

## 2017-06-20 DIAGNOSIS — C884 Extranodal marginal zone B-cell lymphoma of mucosa-associated lymphoid tissue [MALT-lymphoma]: Secondary | ICD-10-CM | POA: Diagnosis not present

## 2017-06-20 DIAGNOSIS — Z923 Personal history of irradiation: Secondary | ICD-10-CM | POA: Diagnosis not present

## 2017-06-21 DIAGNOSIS — N492 Inflammatory disorders of scrotum: Secondary | ICD-10-CM | POA: Diagnosis not present

## 2017-06-21 DIAGNOSIS — N433 Hydrocele, unspecified: Secondary | ICD-10-CM | POA: Diagnosis not present

## 2017-06-21 DIAGNOSIS — N441 Cyst of tunica albuginea testis: Secondary | ICD-10-CM | POA: Diagnosis not present

## 2017-06-21 DIAGNOSIS — I861 Scrotal varices: Secondary | ICD-10-CM | POA: Diagnosis not present

## 2017-06-27 ENCOUNTER — Telehealth: Payer: Self-pay | Admitting: Cardiovascular Disease

## 2017-06-27 NOTE — Telephone Encounter (Signed)
Wife is calling to ask Dr Acie Fredrickson if amlodipine or Benicar would be causing the pt to have rapid weight loss of 20 lbs in 2 months.  Wife states that  2 months ago, the pt was 160 and now 140 lbs.  Spoke with our Pharmacist Megan Supple about both BP meds and if they would cause rapid weight loss.  Per Jinny Blossom, they do not cause rapid weight loss, and she recommends that the pt follow-up with his PCP, to rule out other causes.  Per the pts wife, the pt has upcoming appts at [facility], for colonoscopy and to follow with his PCP.  Wife states that the pt had cancer last year, and is going to have some follow-up appts for this, here in the near future.  Highly encouraged the pts wife to continue with scheduled follow-up with pts Oncologist, GI, and PCP.  Informed the pts wife that I will route this message to both Dr Acie Fredrickson and Sharyn Lull RN for further review and follow-up.  Wife verbalized understanding and agrees with this plan. Wife gracious for all the assistance provided.

## 2017-06-27 NOTE — Telephone Encounter (Signed)
Follow Up: ° ° ° °Returning your call from this morning. °

## 2017-06-27 NOTE — Telephone Encounter (Signed)
Left a message for the pt and wife to call back and ask for triage.

## 2017-06-27 NOTE — Telephone Encounter (Signed)
New message    Pt wife is calling asking for a call back.   Pt c/o medication issue:  1. Name of Medication: BP medication.  2. How are you currently taking this medication (dosage and times per day)?   3. Are you having a reaction (difficulty breathing--STAT)? Weight loss-pt wife states that she isn't sure if this is related to the medication but he was 160 and is now 140 in 2 months.  4. What is your medication issue? Pt wfie states that pt has to take 1 and a half pills to keep bp below 150.

## 2017-06-27 NOTE — Telephone Encounter (Signed)
Follow Up:; ° ° °Returning your call. °

## 2017-06-27 NOTE — Telephone Encounter (Signed)
Left a message for pt and/or wife to call back and ask to speak with a triage Nurse.

## 2017-06-28 NOTE — Telephone Encounter (Signed)
Neither amlodipine or Benicar will cause this type of weight loss I agree with having him see his primary to evaluate this further Concerning for cancer

## 2017-06-28 NOTE — Telephone Encounter (Signed)
Discussed with patient's wife, pt has an appointment today with GI specialist.

## 2017-06-29 DIAGNOSIS — C44311 Basal cell carcinoma of skin of nose: Secondary | ICD-10-CM | POA: Diagnosis not present

## 2017-07-12 DIAGNOSIS — R197 Diarrhea, unspecified: Secondary | ICD-10-CM | POA: Diagnosis not present

## 2017-07-12 DIAGNOSIS — I10 Essential (primary) hypertension: Secondary | ICD-10-CM | POA: Diagnosis not present

## 2017-07-12 DIAGNOSIS — K5730 Diverticulosis of large intestine without perforation or abscess without bleeding: Secondary | ICD-10-CM | POA: Diagnosis not present

## 2017-07-12 DIAGNOSIS — E785 Hyperlipidemia, unspecified: Secondary | ICD-10-CM | POA: Diagnosis not present

## 2017-07-12 DIAGNOSIS — K5289 Other specified noninfective gastroenteritis and colitis: Secondary | ICD-10-CM | POA: Diagnosis not present

## 2017-07-12 DIAGNOSIS — I358 Other nonrheumatic aortic valve disorders: Secondary | ICD-10-CM | POA: Diagnosis not present

## 2017-07-12 DIAGNOSIS — K529 Noninfective gastroenteritis and colitis, unspecified: Secondary | ICD-10-CM | POA: Diagnosis not present

## 2017-07-12 DIAGNOSIS — K633 Ulcer of intestine: Secondary | ICD-10-CM | POA: Diagnosis not present

## 2017-07-12 DIAGNOSIS — Z87891 Personal history of nicotine dependence: Secondary | ICD-10-CM | POA: Diagnosis not present

## 2017-07-12 DIAGNOSIS — I071 Rheumatic tricuspid insufficiency: Secondary | ICD-10-CM | POA: Diagnosis not present

## 2017-07-15 DIAGNOSIS — C8599 Non-Hodgkin lymphoma, unspecified, extranodal and solid organ sites: Secondary | ICD-10-CM | POA: Diagnosis not present

## 2017-07-15 DIAGNOSIS — C884 Extranodal marginal zone B-cell lymphoma of mucosa-associated lymphoid tissue [MALT-lymphoma]: Secondary | ICD-10-CM | POA: Diagnosis not present

## 2017-07-15 DIAGNOSIS — H119 Unspecified disorder of conjunctiva: Secondary | ICD-10-CM | POA: Diagnosis not present

## 2017-07-27 ENCOUNTER — Ambulatory Visit: Payer: Medicare Other | Admitting: Cardiovascular Disease

## 2017-07-29 ENCOUNTER — Ambulatory Visit (INDEPENDENT_AMBULATORY_CARE_PROVIDER_SITE_OTHER): Payer: Medicare Other | Admitting: Cardiovascular Disease

## 2017-07-29 ENCOUNTER — Encounter: Payer: Self-pay | Admitting: Cardiovascular Disease

## 2017-07-29 VITALS — BP 142/70 | HR 74 | Ht 70.0 in | Wt 143.4 lb

## 2017-07-29 DIAGNOSIS — I10 Essential (primary) hypertension: Secondary | ICD-10-CM | POA: Diagnosis not present

## 2017-07-29 NOTE — Progress Notes (Signed)
Cardiology Office Note   Date:  07/29/2017   ID:  Julian [name], DOB [birth year], MRN [id number]  PCP:  [name], MD  Cardiologist:   [name], MD   Chief Complaint  Patient presents with  . Hypertension   1. Hypertension  Mr. Chenier is an 83 yo , hx of HTN. Works out regularly. Does the elliptical without any difficulty. Has had some mild episodes of faint feeling. Occurred during Exercising. Occasionally has orthostatic hypotension. A month ago. He was driving and felt a bit faint. Thought that he might need to pull over to the side of the road. Drinks water while exercising,  Episodes are not always related to exercise  6-7 years ago he had an episode of very high BP. He was started on BP meds at that time. Resolved after several hours,   Non smoker ETOH 2 drinks a day Fhx: +, mother died at 20, father died age 80 , sister died of complications related to alzheimers. , brother had valve replacement. Retired from [employer].  Moved from [place], [place] several years ago. [place].    10/23/2014: Ivar Domangue is a 83 y.o. male who presents for follow-up for his high blood pressure.  we decreased the amlodipine due to some orthostatic hypotension. BP has been ok at home.   Eats well most of the time.  Occasionally gets a bit of extra salt.   Still very active.    April 22, 2015:  still having some orthostatic hypotension.  BP is normal at  Has had some occasional palpitations  - also sees it registers on his BP cuff. Gives an error message .  His amlodipine dose was reduced about a year ago .  Still has episodes of dizziness - only lasts about 2-3 seconds  These episodes can occur while seated - and even had occurred while driving  Do not occur when he has been exercising .  Does the elliptical machine for 40 minutes without any CP or dyspnea, no dizziness  Then works out with [employer].   07/09/2015:  Mr. Sahakian is seen  today for variable BP.   He had some orthostatic hypotension symptoms ,  We had stopped his amlodipine.  BP increased slightly .   We started back Amlodipine at 2. 5 mg a day  He brought his blood pressure log with him today. Most of his readings are mildly to moderately elevated.  October 27, 2015  Mr. Mostafa is doing well.  Brought his BP log. Most of his readings are normal .  No CP or dyspnea.   No dizziness since we  Adjusted his amlodipine therapy Is through with his XRT therapy   Oct. 27, 2017:  Azure is seen for follow up visit . Has been having some BP variability and some dizzy spells.  He has held his Amlodipine in the past ( early 2016) but we then restarted it at 2.5 mg a day for some high BP readings.   I have reviewed his home BP log. He has had some BP variability ,   Occasional BP of 140s and 150s.   Leaving for [place] on Nov. 8.   ( [name] for 5 days, then a cruise)   He was sick yesterday and had diarrhea all day - did not eat much . He has had some dizziness this am ( and BP is found to be 92/44 today in the office )  Feeling better now.  Feb.  1, 2018:  Had a great visit to [place] several months .  Has been having some HTN issues. Took Losartan 50  mg this am ( instead of 1/2 tablet )  Had a TIA like symptom. Could not speak clearly ,   Started on ASA 81 mg that day   January 26, 2017:  Mr. 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  Had XRT initially   Will be starting chemo  He has changed his diet - no salt and no canned foods.   Sept, 4, 2018:   Darsh is seen today  Brought his BP log. Many normal readings BP is generally a bit higher than he would like   July 29, 2017:  Overall doing well. Has changed to Benicar from Losartan which seems to be doing well  Brought his BP log. Readings look great  Has lost some weight.   Past Medical History:  Diagnosis Date  . Anemia   . Hyperlipidemia    . Hypertension   . Prostate cancer [facility])     Past Surgical History:  Procedure Laterality Date  . CARPAL TUNNEL RELEASE Left   . HERNIA REPAIR     MID 80'S  . KNEE SURGERY Left   . PROSTATECTOMY  2001     Current Outpatient Medications  Medication Sig Dispense Refill  . alendronate (FOSAMAX) 70 MG tablet Take 70 mg by mouth once a week. Take with a full glass of water on an empty stomach.    [name] amLODipine (NORVASC) 2.5 MG tablet Take 2.5 mg by mouth daily as needed (elevated BP).    [name] amLODipine (NORVASC) 2.5 MG tablet Take 1 tablet (2.5 mg total) by mouth daily as needed (elevated BP). 90 tablet 3  . aspirin EC 81 MG tablet Take 81 mg by mouth daily.    [name] atorvastatin (LIPITOR) 40 MG tablet TAKE 1 TABLET BY MOUTH  DAILY 90 tablet 3  . Cyanocobalamin (B-12) 5000 MCG CAPS Take 1 tablet by mouth daily.    [name] Fish Oil-Cholecalciferol (FISH OIL + D3 PO) Take 1,400 mg by mouth daily.    . fluticasone (FLONASE) 50 MCG/ACT nasal spray Place 2 sprays into both nostrils as needed for allergies or rhinitis.    . Lutein-Zeaxanthin 25-5 MG CAPS Take 1 tablet by mouth daily.     . Multiple Vitamin (MULTIVITAMIN) tablet Take 1 tablet by mouth daily. CENTRUM SILVER    . olmesartan (BENICAR) 20 MG tablet Take 1 tablet (20 mg total) by mouth daily. 90 tablet 3   No current facility-administered medications for this visit.     Allergies:   Patient has no known allergies.    Social History:  The patient  reports that he quit smoking about 35 years ago. he has never used smokeless tobacco. He reports that he drinks alcohol. He reports that he does not use drugs.   Family History:  The patient's family history includes Alzheimer's disease in his brother; CAD in his father and mother; Dementia in his sister; Heart attack in his sister.    ROS:  Please see the history of present illness.    Physical Exam: Blood pressure (!) 142/70, pulse 74, height 5\' 10"  (1.778 m), weight 143 lb 6.4 oz (65 kg),  SpO2 94 %.  GEN:  Thin, elderly male  HEENT: Normal NECK: No JVD; No carotid bruits LYMPHATICS: No lymphadenopathy CARDIAC: RR, no murmurs, rubs, gallops  RESPIRATORY:  Clear to auscultation without rales, wheezing or rhonchi  ABDOMEN: Soft, non-tender, non-distended MUSCULOSKELETAL:  No edema; No deformity  SKIN: Warm and dry NEUROLOGIC:  Alert and oriented x 3   EKG:      Recent Labs: 05/17/2017: BUN 12; Creatinine, Ser 0.91; Potassium 4.5; Sodium 139    Lipid Panel    Component Value Date/Time   CHOL 127 07/09/2015 0915   TRIG 109 07/09/2015 0915   HDL 49 07/09/2015 0915   CHOLHDL 2.6 07/09/2015 0915   VLDL 22 07/09/2015 0915   LDLCALC 56 07/09/2015 0915      Wt Readings from Last 3 Encounters:  07/29/17 143 lb 6.4 oz (65 kg)  04/26/17 152 lb (68.9 kg)  03/10/17 150 lb (68 kg)    ECG:  .    Other studies Reviewed: Additional studies/ records that were reviewed today include:  Review of the above records demonstrates:    ASSESSMENT AND PLAN:  1.  Essential hypertension:-         Blood pressure looks good.  Continue current medications.  2. Aortic sclerosis:    3. Tricuspid regurgitation:    4. Hyperlipidemia:     Current medicines are reviewed at length with the patient today.  The patient does not have concerns regarding medicines   Disposition:   FU with me in 6 months        Signed, [name], MD  07/29/2017 11:31 AM    [facility] [address], [place], [place]  [address] Phone: [phone]; Fax: [phone]

## 2017-07-29 NOTE — Patient Instructions (Signed)

## 2017-08-03 DIAGNOSIS — R197 Diarrhea, unspecified: Secondary | ICD-10-CM | POA: Diagnosis not present

## 2017-08-03 DIAGNOSIS — Z09 Encounter for follow-up examination after completed treatment for conditions other than malignant neoplasm: Secondary | ICD-10-CM | POA: Diagnosis not present

## 2017-08-03 DIAGNOSIS — Z8719 Personal history of other diseases of the digestive system: Secondary | ICD-10-CM | POA: Diagnosis not present

## 2017-08-05 DIAGNOSIS — I10 Essential (primary) hypertension: Secondary | ICD-10-CM | POA: Diagnosis not present

## 2017-08-05 DIAGNOSIS — Z79899 Other long term (current) drug therapy: Secondary | ICD-10-CM | POA: Diagnosis not present

## 2017-08-05 DIAGNOSIS — C884 Extranodal marginal zone B-cell lymphoma of mucosa-associated lymphoid tissue [MALT-lymphoma]: Secondary | ICD-10-CM | POA: Diagnosis not present

## 2017-08-05 DIAGNOSIS — Z8546 Personal history of malignant neoplasm of prostate: Secondary | ICD-10-CM | POA: Diagnosis not present

## 2017-08-05 DIAGNOSIS — G3184 Mild cognitive impairment, so stated: Secondary | ICD-10-CM | POA: Diagnosis not present

## 2017-08-31 DIAGNOSIS — C44311 Basal cell carcinoma of skin of nose: Secondary | ICD-10-CM | POA: Diagnosis not present

## 2017-09-16 DIAGNOSIS — R82998 Other abnormal findings in urine: Secondary | ICD-10-CM | POA: Diagnosis not present

## 2017-09-19 DIAGNOSIS — M810 Age-related osteoporosis without current pathological fracture: Secondary | ICD-10-CM | POA: Diagnosis not present

## 2017-09-19 DIAGNOSIS — I10 Essential (primary) hypertension: Secondary | ICD-10-CM | POA: Diagnosis not present

## 2017-09-19 DIAGNOSIS — Z125 Encounter for screening for malignant neoplasm of prostate: Secondary | ICD-10-CM | POA: Diagnosis not present

## 2017-09-19 DIAGNOSIS — E7849 Other hyperlipidemia: Secondary | ICD-10-CM | POA: Diagnosis not present

## 2017-09-23 DIAGNOSIS — M810 Age-related osteoporosis without current pathological fracture: Secondary | ICD-10-CM | POA: Diagnosis not present

## 2017-09-23 DIAGNOSIS — I10 Essential (primary) hypertension: Secondary | ICD-10-CM | POA: Diagnosis not present

## 2017-09-23 DIAGNOSIS — Z6820 Body mass index (BMI) 20.0-20.9, adult: Secondary | ICD-10-CM | POA: Diagnosis not present

## 2017-09-23 DIAGNOSIS — E7849 Other hyperlipidemia: Secondary | ICD-10-CM | POA: Diagnosis not present

## 2017-09-23 DIAGNOSIS — E038 Other specified hypothyroidism: Secondary | ICD-10-CM | POA: Diagnosis not present

## 2017-09-23 DIAGNOSIS — C884 Extranodal marginal zone B-cell lymphoma of mucosa-associated lymphoid tissue [MALT-lymphoma]: Secondary | ICD-10-CM | POA: Diagnosis not present

## 2017-09-23 DIAGNOSIS — Z8546 Personal history of malignant neoplasm of prostate: Secondary | ICD-10-CM | POA: Diagnosis not present

## 2017-09-23 DIAGNOSIS — Z1389 Encounter for screening for other disorder: Secondary | ICD-10-CM | POA: Diagnosis not present

## 2017-09-23 DIAGNOSIS — E46 Unspecified protein-calorie malnutrition: Secondary | ICD-10-CM | POA: Diagnosis not present

## 2017-09-23 DIAGNOSIS — Z0000 Encounter for general adult medical examination without abnormal findings: Secondary | ICD-10-CM | POA: Diagnosis not present

## 2017-09-26 DIAGNOSIS — Z1212 Encounter for screening for malignant neoplasm of rectum: Secondary | ICD-10-CM | POA: Diagnosis not present

## 2017-10-07 DIAGNOSIS — C8599 Non-Hodgkin lymphoma, unspecified, extranodal and solid organ sites: Secondary | ICD-10-CM | POA: Diagnosis not present

## 2017-10-14 ENCOUNTER — Telehealth: Payer: Self-pay | Admitting: Cardiovascular Disease

## 2017-10-14 MED ORDER — LOSARTAN POTASSIUM 50 MG PO TABS: 50.0000 mg | ORAL_TABLET | Freq: Two times a day (BID) | ORAL | 3 refills | Status: DC

## 2017-10-14 NOTE — Telephone Encounter (Signed)
Spoke with patient's wife who states all the drug stores she has contacted are out of Olmesartan and they are wondering what he should take. Dr. Acie Fredrickson changed the patient from Losartan to Olmesartan due to fluctuations in BP at last ov on 12/7 because patient felt that he had been on the Losartan for a long time.  Wife reports BP readings have been better recently and patient has not needed to take the amlodipine 2.5 mg as often as when he was on Losartan. After discussion of different options, she states the patient would like to go back to taking Losartan 50 mg BID. I scheduled the patient to see Dr. Acie Fredrickson on 2/26 for follow-up. Patient's wife was very grateful for my help.

## 2017-10-14 NOTE — Telephone Encounter (Signed)
Pt c/o medication issue:  1. Name of Medication: olmesartan (BENICAR) 20 MG tablet  2. How are you currently taking this medication (dosage and times per day)? Take 1 tablet (20 mg total) by mouth daily. 3. Are you having a reaction (difficulty breathing--STAT)? no  4. What is your medication issue? Pt wife verbalized that [facility]  do not carry the drug any longer, wife has been advised to get the medication in a local pharmacy and pt said that no drug store has it in stock

## 2017-10-18 ENCOUNTER — Ambulatory Visit (INDEPENDENT_AMBULATORY_CARE_PROVIDER_SITE_OTHER): Payer: Medicare Other | Admitting: Cardiovascular Disease

## 2017-10-18 ENCOUNTER — Encounter: Payer: Self-pay | Admitting: Cardiovascular Disease

## 2017-10-18 VITALS — BP 148/62 | HR 122 | Ht 70.0 in | Wt 144.0 lb

## 2017-10-18 DIAGNOSIS — I10 Essential (primary) hypertension: Secondary | ICD-10-CM

## 2017-10-18 MED ORDER — AMLODIPINE BESYLATE 2.5 MG PO TABS: 2.5000 mg | ORAL_TABLET | Freq: Every day | ORAL | 3 refills | Status: DC | PRN

## 2017-10-18 NOTE — Progress Notes (Signed)
Cardiology Office Note   Date:  10/18/2017   ID:  Julian [name], DOB 1925/11/26, MRN [id number]  PCP:  [name], MD  Cardiologist:   [name], MD   Chief Complaint  Patient presents with  . Hypertension   1. Hypertension  Julian [name] is an 84 yo , hx of HTN. Works out regularly. Does the elliptical without any difficulty. Has had some mild episodes of faint feeling. Occurred during Exercising. Occasionally has orthostatic hypotension. A month ago. He was driving and felt a bit faint. Thought that he might need to pull over to the side of the road. Drinks water while exercising,  Episodes are not always related to exercise  6-7 years ago he had an episode of very high BP. He was started on BP meds at that time. Resolved after several hours,   Non smoker ETOH 2 drinks a day Fhx: +, mother died at 61, father died age 69 , sister died of complications related to alzheimers. , brother had valve replacement. Retired from [employer].  Moved from [place], [place] several years ago. [place].    10/23/2014: Julian [name] is a 84 y.o. male who presents for follow-up for his high blood pressure.  we decreased the amlodipine due to some orthostatic hypotension. BP has been ok at home.   Eats well most of the time.  Occasionally gets a bit of extra salt.   Still very active.    April 22, 2015:  still having some orthostatic hypotension.  BP is normal at  Has had some occasional palpitations  - also sees it registers on his BP cuff. Gives an error message .  His amlodipine dose was reduced about a year ago .  Still has episodes of dizziness - only lasts about 2-3 seconds  These episodes can occur while seated - and even had occurred while driving  Do not occur when he has been exercising .  Does the elliptical machine for 40 minutes without any CP or dyspnea, no dizziness  Then works out with [employer].   07/09/2015:  Julian [name] is seen  today for variable BP.   He had some orthostatic hypotension symptoms ,  We had stopped his amlodipine.  BP increased slightly .   We started back Amlodipine at 2. 5 mg a day  He brought his blood pressure log with him today. Most of his readings are mildly to moderately elevated.  October 27, 2015  Julian [name] is doing well.  Brought his BP log. Most of his readings are normal .  No CP or dyspnea.   No dizziness since we  Adjusted his amlodipine therapy Is through with his XRT therapy   Oct. 27, 2017:  Julian [name] is seen for follow up visit . Has been having some BP variability and some dizzy spells.  He has held his Amlodipine in the past ( early 2016) but we then restarted it at 2.5 mg a day for some high BP readings.   I have reviewed his home BP log. He has had some BP variability ,   Occasional BP of 140s and 150s.   Leaving for [place] on Nov. 8.   ( [name] for 5 days, then a cruise)   He was sick yesterday and had diarrhea all day - did not eat much . He has had some dizziness this am ( and BP is found to be 92/44 today in the office )  Feeling better now.  Feb.  1, 2018:  Had a great visit to [place] several months .  Has been having some HTN issues. Took Losartan 50  mg this am ( instead of 1/2 tablet )  Had a TIA like symptom. 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  Had XRT initially   Will be starting chemo  He has changed his diet - no salt and no canned foods.   Sept, 4, 2018:   Julian [name] is seen today  Brought his BP log. Many normal readings BP is generally a bit higher than he would like   July 29, 2017:  Overall doing well. Has changed to Benicar from Losartan which seems to be doing well  Brought his BP log. Readings look great  Has lost some weight.   October 18, 2017: Julian [name] is seen for follow-up.  He was not able  to get  olmesartan.  He is back on losartan.  Blood pressure readings are fairly well controlled. Statin was recently stopped due to interaction with synthroid  Feeling well  No CP or dyspnea Has occasional CP when lying down.   Does not occur when walking   Past Medical History:  Diagnosis Date  . Anemia   . Hyperlipidemia   . Hypertension   . Prostate cancer [facility])     Past Surgical History:  Procedure Laterality Date  . CARPAL TUNNEL RELEASE Left   . HERNIA REPAIR     MID 80'S  . KNEE SURGERY Left   . PROSTATECTOMY  2001     Current Outpatient Medications  Medication Sig Dispense Refill  . aspirin EC 81 MG tablet Take 81 mg by mouth daily.    . Cyanocobalamin (B-12) 5000 MCG CAPS Take 1 tablet by mouth daily.    [name] Fish Oil-Cholecalciferol (FISH OIL + D3 PO) Take 1,400 mg by mouth daily.    . fluticasone (FLONASE) 50 MCG/ACT nasal spray Place 2 sprays into both nostrils as needed for allergies or rhinitis.    [name] losartan (COZAAR) 50 MG tablet Take 1 tablet (50 mg total) by mouth 2 (two) times daily. 90 tablet 3  . Lutein-Zeaxanthin 25-5 MG CAPS Take 1 tablet by mouth daily.     . Multiple Vitamin (MULTIVITAMIN) tablet Take 1 tablet by mouth daily. CENTRUM SILVER    . SYNTHROID 50 MCG tablet Take 1 tablet by mouth daily.     No current facility-administered medications for this visit.     Allergies:   Patient has no known allergies.    Social History:  The patient  reports that he quit smoking about 36 years ago. he has never used smokeless tobacco. He reports that he drinks alcohol. He reports that he does not use drugs.   Family History:  The patient's family history includes Alzheimer's disease in his brother; CAD in his father and mother; Dementia in his sister; Heart attack in his sister.    ROS:  Please see the history of present illness.   Physical Exam: Blood pressure (!) 148/62, pulse (!) 122, height 5\' 10"  (1.778 m), weight 144 lb (65.3 kg), SpO2 97  %.  GEN:  Well nourished, well developed in no acute distress HEENT: Normal NECK: No JVD; No carotid bruits LYMPHATICS: No lymphadenopathy CARDIAC: RR, 1-2 / 6 systolic murmur  RESPIRATORY:  Clear to auscultation without rales, wheezing or rhonchi  ABDOMEN: Soft, non-tender, non-distended MUSCULOSKELETAL:  No edema; No deformity  SKIN: Warm and dry NEUROLOGIC:  Alert and oriented x 3   EKG:      Recent Labs: 05/17/2017: BUN 12; Creatinine, Ser 0.91; Potassium 4.5; Sodium 139    Lipid Panel    Component Value Date/Time   CHOL 127 07/09/2015 0915   TRIG 109 07/09/2015 0915   HDL 49 07/09/2015 0915   CHOLHDL 2.6 07/09/2015 0915   VLDL 22 07/09/2015 0915   LDLCALC 56 07/09/2015 0915      Wt Readings from Last 3 Encounters:  10/18/17 144 lb (65.3 kg)  07/29/17 143 lb 6.4 oz (65 kg)  04/26/17 152 lb (68.9 kg)    ECG:  .    Other studies Reviewed: Additional studies/ records that were reviewed today include:  Review of the above records demonstrates:    ASSESSMENT AND PLAN:  1.  Essential hypertension:-      Back on losartan 100 mg a day. Seems to be feeling well.  Has not had any episodes of chest pain.  2. Aortic sclerosis: Murmur sounds stable.  3. Tricuspid regurgitation:    4. Hyperlipidemia:   Managed by his primary medical doctor.  Current medicines are reviewed at length with the patient today.  The patient does not have concerns regarding medicines   Disposition:   FU with me in 6 months        Signed, [name], MD  10/18/2017 2:52 PM    [facility] [place], [place], [place]  [address] Phone: [phone]; Fax: [phone]

## 2017-10-18 NOTE — Patient Instructions (Signed)
Medication Instructions:  Your physician recommends that you continue on your current medications as directed. Please refer to the Current Medication list given to you today.   Labwork: None Ordered   Testing/Procedures: None Ordered   Follow-Up: Your physician wants you to follow-up in: 6 months with Dr. Nahser.  You will receive a reminder letter in the mail two months in advance. If you don't receive a letter, please call our office to schedule the follow-up appointment.   If you need a refill on your cardiac medications before your next appointment, please call your pharmacy.   Thank you for choosing [facility]! Alline Pio, RN [phone]    

## 2017-10-31 DIAGNOSIS — C884 Extranodal marginal zone B-cell lymphoma of mucosa-associated lymphoid tissue [MALT-lymphoma]: Secondary | ICD-10-CM | POA: Diagnosis not present

## 2017-11-04 DIAGNOSIS — C884 Extranodal marginal zone B-cell lymphoma of mucosa-associated lymphoid tissue [MALT-lymphoma]: Secondary | ICD-10-CM | POA: Diagnosis not present

## 2017-11-18 DIAGNOSIS — E038 Other specified hypothyroidism: Secondary | ICD-10-CM | POA: Diagnosis not present

## 2017-11-18 DIAGNOSIS — R197 Diarrhea, unspecified: Secondary | ICD-10-CM | POA: Diagnosis not present

## 2017-11-18 DIAGNOSIS — Z6820 Body mass index (BMI) 20.0-20.9, adult: Secondary | ICD-10-CM | POA: Diagnosis not present

## 2017-11-18 DIAGNOSIS — K529 Noninfective gastroenteritis and colitis, unspecified: Secondary | ICD-10-CM | POA: Diagnosis not present

## 2017-11-21 DIAGNOSIS — R197 Diarrhea, unspecified: Secondary | ICD-10-CM | POA: Diagnosis not present

## 2017-12-05 DIAGNOSIS — L570 Actinic keratosis: Secondary | ICD-10-CM | POA: Diagnosis not present

## 2017-12-05 DIAGNOSIS — L814 Other melanin hyperpigmentation: Secondary | ICD-10-CM | POA: Diagnosis not present

## 2017-12-05 DIAGNOSIS — L821 Other seborrheic keratosis: Secondary | ICD-10-CM | POA: Diagnosis not present

## 2017-12-05 DIAGNOSIS — D225 Melanocytic nevi of trunk: Secondary | ICD-10-CM | POA: Diagnosis not present

## 2017-12-05 DIAGNOSIS — D1801 Hemangioma of skin and subcutaneous tissue: Secondary | ICD-10-CM | POA: Diagnosis not present

## 2017-12-05 DIAGNOSIS — Z85828 Personal history of other malignant neoplasm of skin: Secondary | ICD-10-CM | POA: Diagnosis not present

## 2017-12-07 DIAGNOSIS — C8599 Non-Hodgkin lymphoma, unspecified, extranodal and solid organ sites: Secondary | ICD-10-CM | POA: Diagnosis not present

## 2017-12-07 DIAGNOSIS — H02403 Unspecified ptosis of bilateral eyelids: Secondary | ICD-10-CM | POA: Diagnosis not present

## 2017-12-26 ENCOUNTER — Other Ambulatory Visit: Payer: Self-pay | Admitting: Cardiovascular Disease

## 2018-01-04 DIAGNOSIS — C8599 Non-Hodgkin lymphoma, unspecified, extranodal and solid organ sites: Secondary | ICD-10-CM | POA: Diagnosis not present

## 2018-01-10 DIAGNOSIS — L989 Disorder of the skin and subcutaneous tissue, unspecified: Secondary | ICD-10-CM | POA: Diagnosis not present

## 2018-01-11 ENCOUNTER — Other Ambulatory Visit: Payer: Self-pay | Admitting: Cardiovascular Disease

## 2018-01-11 MED ORDER — LOSARTAN POTASSIUM 50 MG PO TABS: 50.0000 mg | ORAL_TABLET | Freq: Two times a day (BID) | ORAL | 2 refills | Status: DC

## 2018-01-18 DIAGNOSIS — C884 Extranodal marginal zone B-cell lymphoma of mucosa-associated lymphoid tissue [MALT-lymphoma]: Secondary | ICD-10-CM | POA: Diagnosis not present

## 2018-01-20 DIAGNOSIS — C884 Extranodal marginal zone B-cell lymphoma of mucosa-associated lymphoid tissue [MALT-lymphoma]: Secondary | ICD-10-CM | POA: Diagnosis not present

## 2018-01-20 DIAGNOSIS — H0589 Other disorders of orbit: Secondary | ICD-10-CM | POA: Diagnosis not present

## 2018-01-25 DIAGNOSIS — C8599 Non-Hodgkin lymphoma, unspecified, extranodal and solid organ sites: Secondary | ICD-10-CM | POA: Diagnosis not present

## 2018-01-30 DIAGNOSIS — C884 Extranodal marginal zone B-cell lymphoma of mucosa-associated lymphoid tissue [MALT-lymphoma]: Secondary | ICD-10-CM | POA: Diagnosis not present

## 2018-02-03 DIAGNOSIS — H0589 Other disorders of orbit: Secondary | ICD-10-CM | POA: Diagnosis not present

## 2018-02-03 DIAGNOSIS — C884 Extranodal marginal zone B-cell lymphoma of mucosa-associated lymphoid tissue [MALT-lymphoma]: Secondary | ICD-10-CM | POA: Diagnosis not present

## 2018-02-06 DIAGNOSIS — C884 Extranodal marginal zone B-cell lymphoma of mucosa-associated lymphoid tissue [MALT-lymphoma]: Secondary | ICD-10-CM | POA: Diagnosis not present

## 2018-02-07 DIAGNOSIS — C884 Extranodal marginal zone B-cell lymphoma of mucosa-associated lymphoid tissue [MALT-lymphoma]: Secondary | ICD-10-CM | POA: Diagnosis not present

## 2018-02-24 DIAGNOSIS — C884 Extranodal marginal zone B-cell lymphoma of mucosa-associated lymphoid tissue [MALT-lymphoma]: Secondary | ICD-10-CM | POA: Diagnosis not present

## 2018-03-18 DIAGNOSIS — L989 Disorder of the skin and subcutaneous tissue, unspecified: Secondary | ICD-10-CM | POA: Diagnosis not present

## 2018-03-18 DIAGNOSIS — R21 Rash and other nonspecific skin eruption: Secondary | ICD-10-CM | POA: Diagnosis not present

## 2018-03-18 DIAGNOSIS — Z79899 Other long term (current) drug therapy: Secondary | ICD-10-CM | POA: Diagnosis not present

## 2018-03-18 DIAGNOSIS — W57XXXA Bitten or stung by nonvenomous insect and other nonvenomous arthropods, initial encounter: Secondary | ICD-10-CM | POA: Diagnosis not present

## 2018-03-28 DIAGNOSIS — K529 Noninfective gastroenteritis and colitis, unspecified: Secondary | ICD-10-CM | POA: Diagnosis not present

## 2018-03-28 DIAGNOSIS — M810 Age-related osteoporosis without current pathological fracture: Secondary | ICD-10-CM | POA: Diagnosis not present

## 2018-03-28 DIAGNOSIS — C884 Extranodal marginal zone B-cell lymphoma of mucosa-associated lymphoid tissue [MALT-lymphoma]: Secondary | ICD-10-CM | POA: Diagnosis not present

## 2018-03-28 DIAGNOSIS — E7849 Other hyperlipidemia: Secondary | ICD-10-CM | POA: Diagnosis not present

## 2018-03-28 DIAGNOSIS — I10 Essential (primary) hypertension: Secondary | ICD-10-CM | POA: Diagnosis not present

## 2018-03-28 DIAGNOSIS — R197 Diarrhea, unspecified: Secondary | ICD-10-CM | POA: Diagnosis not present

## 2018-03-28 DIAGNOSIS — Z6820 Body mass index (BMI) 20.0-20.9, adult: Secondary | ICD-10-CM | POA: Diagnosis not present

## 2018-04-07 DIAGNOSIS — H43813 Vitreous degeneration, bilateral: Secondary | ICD-10-CM | POA: Diagnosis not present

## 2018-04-07 DIAGNOSIS — C8599 Non-Hodgkin lymphoma, unspecified, extranodal and solid organ sites: Secondary | ICD-10-CM | POA: Diagnosis not present

## 2018-04-20 ENCOUNTER — Telehealth: Payer: Self-pay | Admitting: Cardiovascular Disease

## 2018-04-20 DIAGNOSIS — C8599 Non-Hodgkin lymphoma, unspecified, extranodal and solid organ sites: Secondary | ICD-10-CM | POA: Diagnosis not present

## 2018-04-20 NOTE — Telephone Encounter (Incomplete)
New Message         Patient states he may have recv'd the wrong drug, would like a call back concerning this matter.  Lost phone call. Unable to complete . Dot phrases   Pt c/o medication issue:  1. Name of Medication:   2. How are you currently taking this medication (dosage and times per day)? ***  3. Are you having a reaction (difficulty breathing--STAT)? ***  4. What is your medication issue? ***

## 2018-04-20 NOTE — Telephone Encounter (Signed)
Spoke with patient who states he received a shipment of Olmesartan 20 mg from [facility]. He was switched from Olmesartan to Losartan earlier this year when his pharmacy could not get Olmesartan. I advised him to contact [facility] because the Olmesartan Rx was discontinued. I advised that we do not want to have to switch medications if Olmesartan will be difficult to refill in the future. He states he will call [facility] and I will call him back tomorrow to determine plan. He thanked me for the call.

## 2018-04-21 MED ORDER — LOSARTAN POTASSIUM 100 MG PO TABS: 100.0000 mg | ORAL_TABLET | Freq: Every day | ORAL | 3 refills | Status: DC

## 2018-04-21 NOTE — Telephone Encounter (Signed)
Called patient who states he talked with [facility] who state they do not have a Rx for Losartan for the patient. Patient states he found an additional supply of Losartan at home and he has enough to take until new Rx arrives. I advised that I will send a new Rx now and advised that our records indicate it was received by [facility] in May. Patient states he has been taking 2 50 mg tablets daily and requests that I send Losartan 100 mg tabs. I verbalized agreement and he thanked me for the call.

## 2018-04-26 ENCOUNTER — Telehealth: Payer: Self-pay | Admitting: Cardiovascular Disease

## 2018-04-26 MED ORDER — LOSARTAN POTASSIUM 100 MG PO TABS: 100.0000 mg | ORAL_TABLET | Freq: Every day | ORAL | 0 refills | Status: DC

## 2018-04-26 NOTE — Telephone Encounter (Signed)
Spoke with patient's wife who states they received a message from [facility] that the Losartan is going to be delayed in arriving. She requests a 30 day supply from a local pharmacy. I called [facility] per patient request and the pharmacist advised that they are able to fill #30 Losartan 100 mg tabs for patient. I advised patient's wife and she thanked me for my help.

## 2018-04-26 NOTE — Telephone Encounter (Signed)
New Message:    Patient is returning a call about his RX

## 2018-05-11 DIAGNOSIS — C884 Extranodal marginal zone B-cell lymphoma of mucosa-associated lymphoid tissue [MALT-lymphoma]: Secondary | ICD-10-CM | POA: Diagnosis not present

## 2018-05-11 DIAGNOSIS — D487 Neoplasm of uncertain behavior of other specified sites: Secondary | ICD-10-CM | POA: Diagnosis not present

## 2018-05-17 DIAGNOSIS — C884 Extranodal marginal zone B-cell lymphoma of mucosa-associated lymphoid tissue [MALT-lymphoma]: Secondary | ICD-10-CM | POA: Diagnosis not present

## 2018-05-26 DIAGNOSIS — Z923 Personal history of irradiation: Secondary | ICD-10-CM | POA: Diagnosis not present

## 2018-05-26 DIAGNOSIS — C884 Extranodal marginal zone B-cell lymphoma of mucosa-associated lymphoid tissue [MALT-lymphoma]: Secondary | ICD-10-CM | POA: Diagnosis not present

## 2018-05-31 DIAGNOSIS — D485 Neoplasm of uncertain behavior of skin: Secondary | ICD-10-CM | POA: Diagnosis not present

## 2018-05-31 DIAGNOSIS — C44311 Basal cell carcinoma of skin of nose: Secondary | ICD-10-CM | POA: Diagnosis not present

## 2018-06-02 DIAGNOSIS — C884 Extranodal marginal zone B-cell lymphoma of mucosa-associated lymphoid tissue [MALT-lymphoma]: Secondary | ICD-10-CM | POA: Diagnosis not present

## 2018-06-02 DIAGNOSIS — Z23 Encounter for immunization: Secondary | ICD-10-CM | POA: Diagnosis not present

## 2018-06-09 DIAGNOSIS — Z5111 Encounter for antineoplastic chemotherapy: Secondary | ICD-10-CM | POA: Diagnosis not present

## 2018-06-09 DIAGNOSIS — C884 Extranodal marginal zone B-cell lymphoma of mucosa-associated lymphoid tissue [MALT-lymphoma]: Secondary | ICD-10-CM | POA: Diagnosis not present

## 2018-06-09 DIAGNOSIS — Z79899 Other long term (current) drug therapy: Secondary | ICD-10-CM | POA: Diagnosis not present

## 2018-06-13 ENCOUNTER — Telehealth: Payer: Self-pay | Admitting: Cardiovascular Disease

## 2018-06-13 NOTE — Telephone Encounter (Signed)
New Message   Called patient to remind of him of his appt. Patients wife answered the phone and advised that although the patient will not be here she will not be able to come. She wanted Dr. Acie Fredrickson to know just in case the patient forgets that he Losartan and amlodipine (1.5mg ) at night. He is go on Fridays to have his chemo.

## 2018-06-13 NOTE — Telephone Encounter (Signed)
Pts wife is simply calling to inform Dr Acie Fredrickson and Sharyn Lull RN that the pt will be coming to his appt tomorrow 10/23, alone, and without her.  Wife just wants to make Dr Acie Fredrickson and Sharyn Lull aware that the pt is currently getting chemo on Fridays, and at times, the chemo causes his BP's to elevate.  Wife states that the days he is not on chemo, his BP is WNL's.  Wife states that he is taking his losartan 100 mg po daily every morning, and he is taking low dose amlodipine 2.5 mg po daily in the evenings.  Wife wanted to pass this along, being the pt is a poor historian.  Informed the pts wife that I will route this information to both Dr Acie Fredrickson and Sharyn Lull RN as an Juluis Rainier, for reference to the pts appt tomorrow.  Wife verbalized understanding and agrees with this plan.

## 2018-06-14 ENCOUNTER — Encounter: Payer: Self-pay | Admitting: Cardiovascular Disease

## 2018-06-14 ENCOUNTER — Encounter

## 2018-06-14 ENCOUNTER — Ambulatory Visit (INDEPENDENT_AMBULATORY_CARE_PROVIDER_SITE_OTHER): Payer: Medicare Other | Admitting: Cardiovascular Disease

## 2018-06-14 VITALS — BP 130/66 | HR 62 | Ht 70.0 in | Wt 146.0 lb

## 2018-06-14 DIAGNOSIS — I10 Essential (primary) hypertension: Secondary | ICD-10-CM

## 2018-06-14 DIAGNOSIS — E782 Mixed hyperlipidemia: Secondary | ICD-10-CM | POA: Diagnosis not present

## 2018-06-14 DIAGNOSIS — I4510 Unspecified right bundle-branch block: Secondary | ICD-10-CM

## 2018-06-14 NOTE — Patient Instructions (Signed)

## 2018-06-14 NOTE — Progress Notes (Signed)
Cardiology Office Note   Date:  06/14/2018   ID:  Julian [name], DOB 03-04-1926, MRN [id number]  PCP:  [name], MD  Cardiologist:   [name], MD   Chief Complaint  Patient presents with  . Hypertension   1. Hypertension  [name] is an 84 yo , hx of HTN. Works out regularly. Does the elliptical without any difficulty. Has had some mild episodes of faint feeling. Occurred during Exercising. Occasionally has orthostatic hypotension. A month ago. He was driving and felt a bit faint. Thought that he might need to pull over to the side of the road. Drinks water while exercising,  Episodes are not always related to exercise  6-7 years ago he had an episode of very high BP. He was started on BP meds at that time. Resolved after several hours,   Non smoker ETOH 2 drinks a day Fhx: +, mother died at 14, father died age 11 , sister died of complications related to alzheimers. , brother had valve replacement. Retired from [employer].  Moved from [place], [place] several years ago. [place].    10/23/2014: Julian [name] is a 84 y.o. male who presents for follow-up for his high blood pressure.  we decreased the amlodipine due to some orthostatic hypotension. BP has been ok at home.   Eats well most of the time.  Occasionally gets a bit of extra salt.   Still very active.    April 22, 2015:  still having some orthostatic hypotension.  BP is normal at  Has had some occasional palpitations  - also sees it registers on his BP cuff. Gives an error message .  His amlodipine dose was reduced about a year ago .  Still has episodes of dizziness - only lasts about 2-3 seconds  These episodes can occur while seated - and even had occurred while driving  Do not occur when he has been exercising .  Does the elliptical machine for 40 minutes without any CP or dyspnea, no dizziness  Then works out with [employer].   07/09/2015:  [name] is  seen today for variable BP.   He had some orthostatic hypotension symptoms ,  We had stopped his amlodipine.  BP increased slightly .   We started back Amlodipine at 2. 5 mg a day  He brought his blood pressure log with him today. Most of his readings are mildly to moderately elevated.  October 27, 2015  [name] is doing well.  Brought his BP log. Most of his readings are normal .  No CP or dyspnea.   No dizziness since we  Adjusted his amlodipine therapy Is through with his XRT therapy   Oct. 27, 2017:  Julian [name] is seen for follow up visit . Has been having some BP variability and some dizzy spells.  He has held his Amlodipine in the past ( early 2016) but we then restarted it at 2.5 mg a day for some high BP readings.   I have reviewed his home BP log. He has had some BP variability ,   Occasional BP of 140s and 150s.   Leaving for [place] on Nov. 8.   ( [name] for 5 days, then a cruise)   He was sick yesterday and had diarrhea all day - did not eat much . He has had some dizziness this am ( and BP is found to be 92/44 today in the office )  Feeling better now.  Feb.  1, 2018:  Had a great visit to [place] several months .  Has been having some HTN issues. Took Losartan 50  mg this am ( instead of 1/2 tablet )  Had a TIA like symptom. Could not speak clearly ,   Started on ASA 81 mg that day   January 26, 2017:  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  Had XRT initially   Will be starting chemo  He has changed his diet - no salt and no canned foods.   Sept, 4, 2018:   Julian [name] is seen today  Brought his BP log. Many normal readings BP is generally a bit higher than he would like   July 29, 2017:  Overall doing well. Has changed to Benicar from Losartan which seems to be doing well  Brought his BP log. Readings look great  Has lost some weight.   October 18, 2017: Julian [name] is seen for follow-up.  He was not  able to get  olmesartan.  He is back on losartan.  Blood pressure readings are fairly well controlled. Statin was recently stopped due to interaction with synthroid  Feeling well  No CP or dyspnea Has occasional CP when lying down.   Does not occur when walking   Oct. 23, 2019:  Doing well.   BP  Is well controlled  Is back on Losartan .  Still losing weight Is getting XRT for his non hodgkins lymphoma  Also getting modified chemo - takes amlodipine 1.25 mg a day   Overall feels well    Past Medical History:  Diagnosis Date  . Anemia   . Hyperlipidemia   . Hypertension   . Prostate cancer [facility])     Past Surgical History:  Procedure Laterality Date  . CARPAL TUNNEL RELEASE Left   . HERNIA REPAIR     MID 80'S  . KNEE SURGERY Left   . PROSTATECTOMY  2001     Current Outpatient Medications  Medication Sig Dispense Refill  . amLODipine (NORVASC) 2.5 MG tablet Take 1.25 mg by mouth daily.    [name] aspirin EC 81 MG tablet Take 81 mg by mouth daily.    . Cyanocobalamin (B-12) 5000 MCG CAPS Take 1 tablet by mouth daily.    [name] Fish Oil-Cholecalciferol (FISH OIL + D3 PO) Take 1,400 mg by mouth daily.    . fluticasone (FLONASE) 50 MCG/ACT nasal spray Place 2 sprays into both nostrils as needed for allergies or rhinitis.    [name] losartan (COZAAR) 100 MG tablet Take 1 tablet (100 mg total) by mouth daily. 30 tablet 0  . Lutein-Zeaxanthin 25-5 MG CAPS Take 1 tablet by mouth daily.     . Multiple Vitamin (MULTIVITAMIN) tablet Take 1 tablet by mouth daily. CENTRUM SILVER     No current facility-administered medications for this visit.     Allergies:   Patient has no known allergies.    Social History:  The patient  reports that he quit smoking about 36 years ago. He has never used smokeless tobacco. He reports that he drinks alcohol. He reports that he does not use drugs.   Family History:  The patient's family history includes Alzheimer's disease in his brother; CAD in his father and  mother; Dementia in his sister; Heart attack in his sister.    ROS:  Please see the history of present illness.   Physical Exam: Blood pressure 130/66,  pulse 62, height 5\' 10"  (1.778 m), weight 146 lb (66.2 kg), SpO2 99 %.  GEN:  Thin, elderly gentleman, no acute distress HEENT: Normal NECK: No JVD; No carotid bruits LYMPHATICS: No lymphadenopathy CARDIAC: RRR , soft systolic murmur  RESPIRATORY:  Clear to auscultation without rales, wheezing or rhonchi  ABDOMEN: Soft, non-tender, non-distended MUSCULOSKELETAL:  No edema; No deformity  SKIN: Warm and dry NEUROLOGIC:  Alert and oriented x 3   EKG:     June 14, 2018: Normal sinus rhythm at 62.  Right bundle branch block.   Recent Labs: No results found for requested labs within last 8760 hours.    Lipid Panel    Component Value Date/Time   CHOL 127 07/09/2015 0915   TRIG 109 07/09/2015 0915   HDL 49 07/09/2015 0915   CHOLHDL 2.6 07/09/2015 0915   VLDL 22 07/09/2015 0915   LDLCALC 56 07/09/2015 0915      Wt Readings from Last 3 Encounters:  06/14/18 146 lb (66.2 kg)  10/18/17 144 lb (65.3 kg)  07/29/17 143 lb 6.4 oz (65 kg)    ECG:  June 14, 2018:  Normal sinus rhythm at 62 beats a minute.  Right bundle branch block.  No changes from previous EKG.  Other studies Reviewed: Additional studies/ records that were reviewed today include:  Review of the above records demonstrates:    ASSESSMENT AND PLAN:  1.  Essential hypertension:-     Blood pressure seems to be well controlled.  He is on losartan 100 mg a day.  He takes amlodipine 1.25 mg a day on his chemotherapy days. Overall he feels well.  2. Aortic sclerosis:  -Stable.  3. Tricuspid regurgitation:    4. Hyperlipidemia:    Managed by his primary MD  - lipids look good   5.  RBBB :   Stable   Current medicines are reviewed at length with the patient today.  The patient does not have concerns regarding medicines   Disposition:   FU with me in 6  months        Signed, [name], MD  06/14/2018 9:40 AM    [facility] [place], [place], [place]  [address] Phone: [phone]; Fax: [phone]

## 2018-06-15 DIAGNOSIS — Z85828 Personal history of other malignant neoplasm of skin: Secondary | ICD-10-CM | POA: Diagnosis not present

## 2018-06-15 DIAGNOSIS — L820 Inflamed seborrheic keratosis: Secondary | ICD-10-CM | POA: Diagnosis not present

## 2018-06-15 DIAGNOSIS — L821 Other seborrheic keratosis: Secondary | ICD-10-CM | POA: Diagnosis not present

## 2018-06-15 DIAGNOSIS — D485 Neoplasm of uncertain behavior of skin: Secondary | ICD-10-CM | POA: Diagnosis not present

## 2018-06-15 DIAGNOSIS — D0462 Carcinoma in situ of skin of left upper limb, including shoulder: Secondary | ICD-10-CM | POA: Diagnosis not present

## 2018-06-15 DIAGNOSIS — D1801 Hemangioma of skin and subcutaneous tissue: Secondary | ICD-10-CM | POA: Diagnosis not present

## 2018-06-15 DIAGNOSIS — C4441 Basal cell carcinoma of skin of scalp and neck: Secondary | ICD-10-CM | POA: Diagnosis not present

## 2018-06-15 DIAGNOSIS — L814 Other melanin hyperpigmentation: Secondary | ICD-10-CM | POA: Diagnosis not present

## 2018-06-15 DIAGNOSIS — L570 Actinic keratosis: Secondary | ICD-10-CM | POA: Diagnosis not present

## 2018-06-16 DIAGNOSIS — Z5111 Encounter for antineoplastic chemotherapy: Secondary | ICD-10-CM | POA: Diagnosis not present

## 2018-06-16 DIAGNOSIS — Z79899 Other long term (current) drug therapy: Secondary | ICD-10-CM | POA: Diagnosis not present

## 2018-06-16 DIAGNOSIS — C884 Extranodal marginal zone B-cell lymphoma of mucosa-associated lymphoid tissue [MALT-lymphoma]: Secondary | ICD-10-CM | POA: Diagnosis not present

## 2018-06-23 DIAGNOSIS — Z79899 Other long term (current) drug therapy: Secondary | ICD-10-CM | POA: Diagnosis not present

## 2018-06-23 DIAGNOSIS — C884 Extranodal marginal zone B-cell lymphoma of mucosa-associated lymphoid tissue [MALT-lymphoma]: Secondary | ICD-10-CM | POA: Diagnosis not present

## 2018-08-10 DIAGNOSIS — C44311 Basal cell carcinoma of skin of nose: Secondary | ICD-10-CM | POA: Diagnosis not present

## 2018-08-10 DIAGNOSIS — C4441 Basal cell carcinoma of skin of scalp and neck: Secondary | ICD-10-CM | POA: Diagnosis not present

## 2018-08-10 DIAGNOSIS — D0462 Carcinoma in situ of skin of left upper limb, including shoulder: Secondary | ICD-10-CM | POA: Diagnosis not present

## 2018-08-14 DIAGNOSIS — R05 Cough: Secondary | ICD-10-CM | POA: Diagnosis not present

## 2018-08-14 DIAGNOSIS — J069 Acute upper respiratory infection, unspecified: Secondary | ICD-10-CM | POA: Diagnosis not present

## 2018-08-14 DIAGNOSIS — Z6836 Body mass index (BMI) 36.0-36.9, adult: Secondary | ICD-10-CM | POA: Diagnosis not present

## 2018-08-24 ENCOUNTER — Other Ambulatory Visit: Payer: Self-pay | Admitting: Cardiovascular Disease

## 2018-09-20 DIAGNOSIS — M810 Age-related osteoporosis without current pathological fracture: Secondary | ICD-10-CM | POA: Diagnosis not present

## 2018-09-20 DIAGNOSIS — Z125 Encounter for screening for malignant neoplasm of prostate: Secondary | ICD-10-CM | POA: Diagnosis not present

## 2018-09-20 DIAGNOSIS — R82998 Other abnormal findings in urine: Secondary | ICD-10-CM | POA: Diagnosis not present

## 2018-09-20 DIAGNOSIS — I10 Essential (primary) hypertension: Secondary | ICD-10-CM | POA: Diagnosis not present

## 2018-09-20 DIAGNOSIS — E7849 Other hyperlipidemia: Secondary | ICD-10-CM | POA: Diagnosis not present

## 2018-09-22 DIAGNOSIS — C884 Extranodal marginal zone B-cell lymphoma of mucosa-associated lymphoid tissue [MALT-lymphoma]: Secondary | ICD-10-CM | POA: Diagnosis not present

## 2018-09-25 DIAGNOSIS — Z1212 Encounter for screening for malignant neoplasm of rectum: Secondary | ICD-10-CM | POA: Diagnosis not present

## 2018-09-27 DIAGNOSIS — Z6821 Body mass index (BMI) 21.0-21.9, adult: Secondary | ICD-10-CM | POA: Diagnosis not present

## 2018-09-27 DIAGNOSIS — I10 Essential (primary) hypertension: Secondary | ICD-10-CM | POA: Diagnosis not present

## 2018-09-27 DIAGNOSIS — E7849 Other hyperlipidemia: Secondary | ICD-10-CM | POA: Diagnosis not present

## 2018-09-27 DIAGNOSIS — N182 Chronic kidney disease, stage 2 (mild): Secondary | ICD-10-CM | POA: Diagnosis not present

## 2018-09-27 DIAGNOSIS — Z1331 Encounter for screening for depression: Secondary | ICD-10-CM | POA: Diagnosis not present

## 2018-09-27 DIAGNOSIS — K529 Noninfective gastroenteritis and colitis, unspecified: Secondary | ICD-10-CM | POA: Diagnosis not present

## 2018-09-27 DIAGNOSIS — C884 Extranodal marginal zone B-cell lymphoma of mucosa-associated lymphoid tissue [MALT-lymphoma]: Secondary | ICD-10-CM | POA: Diagnosis not present

## 2018-09-27 DIAGNOSIS — E46 Unspecified protein-calorie malnutrition: Secondary | ICD-10-CM | POA: Diagnosis not present

## 2018-09-27 DIAGNOSIS — Z0000 Encounter for general adult medical examination without abnormal findings: Secondary | ICD-10-CM | POA: Diagnosis not present

## 2018-09-27 DIAGNOSIS — Z8546 Personal history of malignant neoplasm of prostate: Secondary | ICD-10-CM | POA: Diagnosis not present

## 2018-09-27 DIAGNOSIS — M810 Age-related osteoporosis without current pathological fracture: Secondary | ICD-10-CM | POA: Diagnosis not present

## 2018-09-27 DIAGNOSIS — E038 Other specified hypothyroidism: Secondary | ICD-10-CM | POA: Diagnosis not present

## 2018-11-17 IMAGING — MR MR HEAD W/O CM
10 series · 45 of 48 positions shown · non-contrast
Comparison: None.

CLINICAL DATA: Confusion and slurred speech 1 month ago

EXAM:
MRI HEAD WITHOUT CONTRAST
TECHNIQUE: Multiplanar, multiecho pulse sequences of the brain and surrounding
structures were obtained without intravenous contrast.

[Series 2: T1 · sagittal · 5.0mm · 0.45mm/px · 1 of 21 slices shown]
[im 1/21]
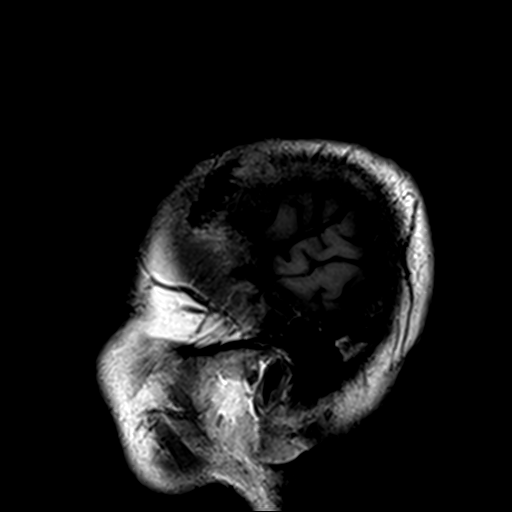

[Series 3: T2 · axial · 5.0mm · 0.51mm/px · z∈[-28,+106]mm · 2 of 22 slices shown (1 of 2)]
[im 1/22]
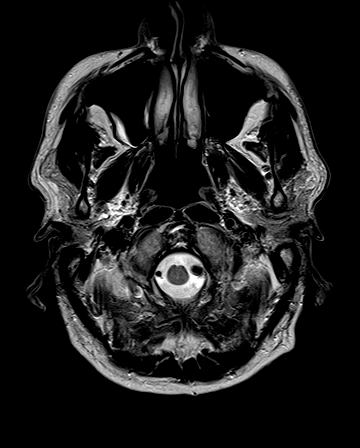
[im 22/22]
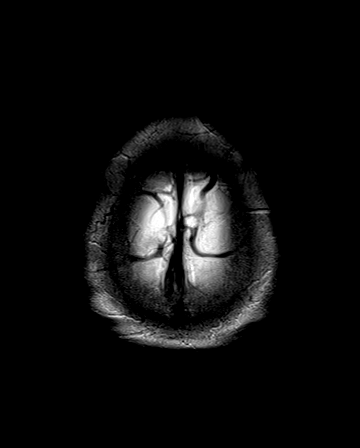

[Series 4: DWI · axial · 3.0mm · 1.80mm/px · z∈[-31,+107]mm · 8 of 96 slices shown (1 of 4)]
[im 1/96]
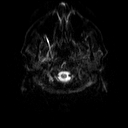
[im 14/96]
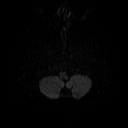
[im 28/96]
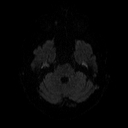
[im 41/96]
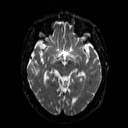
[im 55/96]
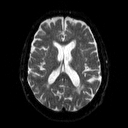
[im 68/96]
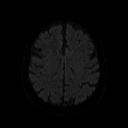
[im 82/96]
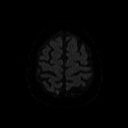
[im 96/96]
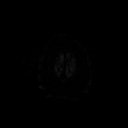

[Series 5: DWI · axial · 3.0mm · 1.80mm/px · z∈[-31,+107]mm · 4 of 47 slices shown (2 of 4)]
[im 1/47]
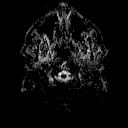
[im 16/47]
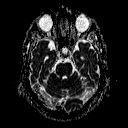
[im 31/47]
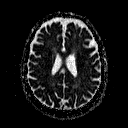
[im 47/47]
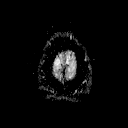

[Series 6: DWI · coronal · 5.0mm · 1.80mm/px · 6 of 68 slices shown (3 of 4)]
[im 1/68]
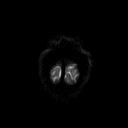
[im 14/68]
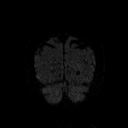
[im 27/68]
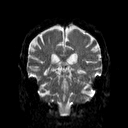
[im 41/68]
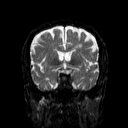
[im 54/68]
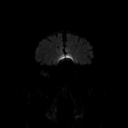
[im 68/68]
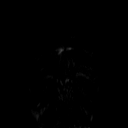

[Series 7: DWI · coronal · 5.0mm · 1.80mm/px · 3 of 34 slices shown (4 of 4)]
[im 1/34]
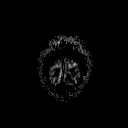
[im 17/34]
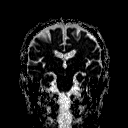
[im 34/34]
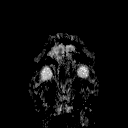

[Series 9: swi_images · axial · 2.0mm · 0.90mm/px · z∈[-31,+109]mm · 6 of 72 slices shown]
[im 1/72]
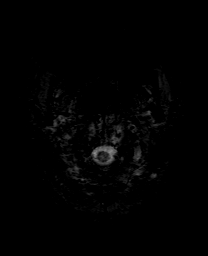
[im 15/72]
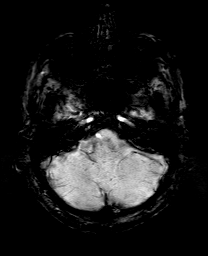
[im 29/72]
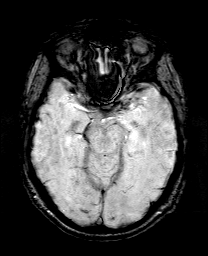
[im 43/72]
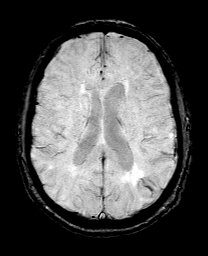
[im 57/72]
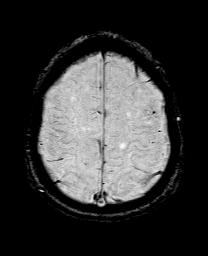
[im 72/72]
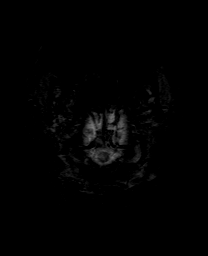

[Series 10: t1_mpr_tra · axial · 1.0mm · 0.45mm/px · z∈[-31,+109]mm · 9 of 144 slices shown]
[im 1/144]
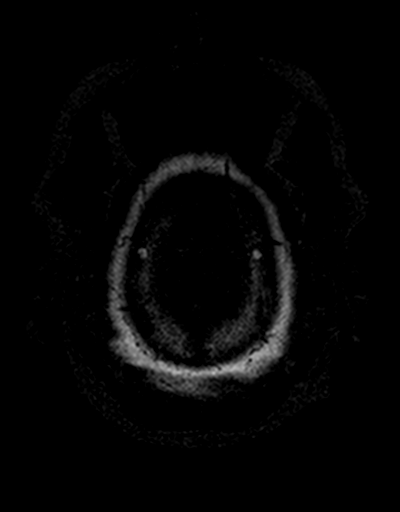
[im 14/144]
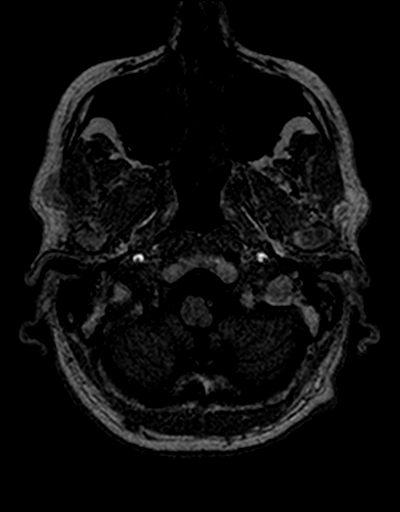
[im 27/144]
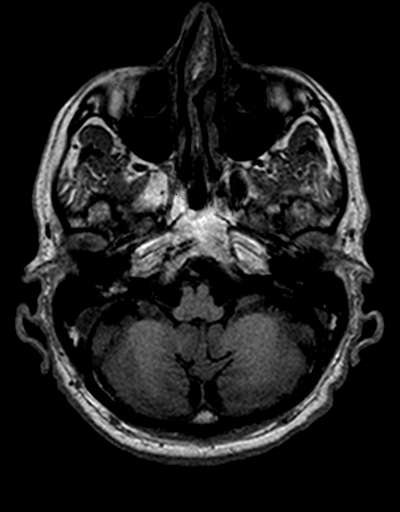
[im 40/144]
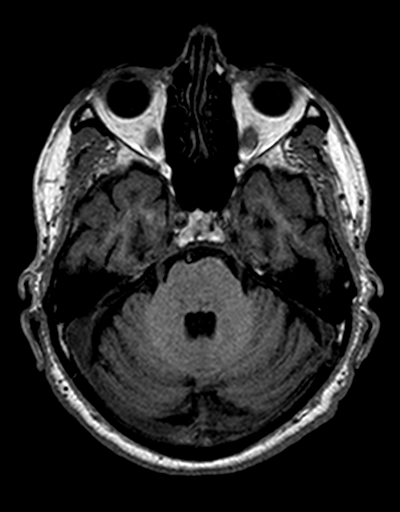
[im 66/144]
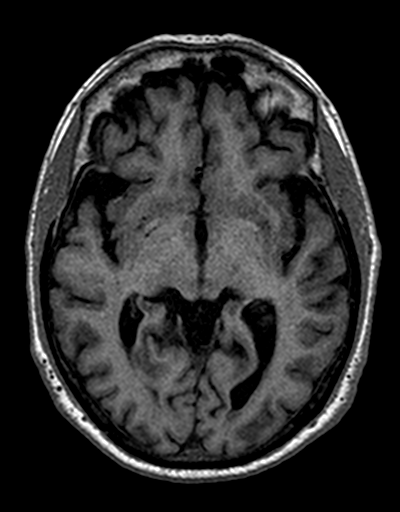
[im 79/144]
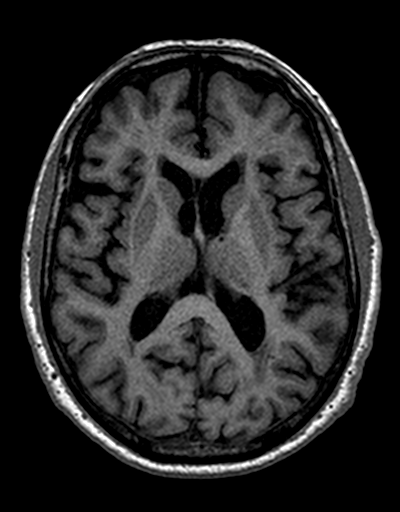
[im 105/144]
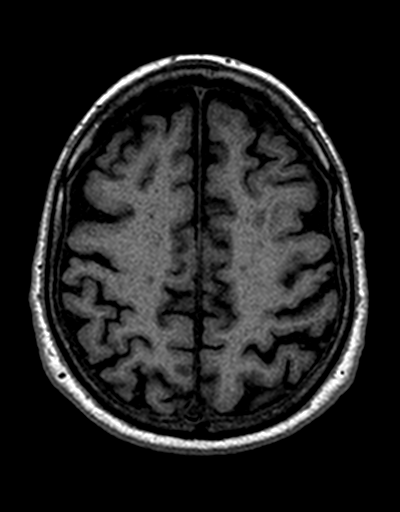
[im 118/144]
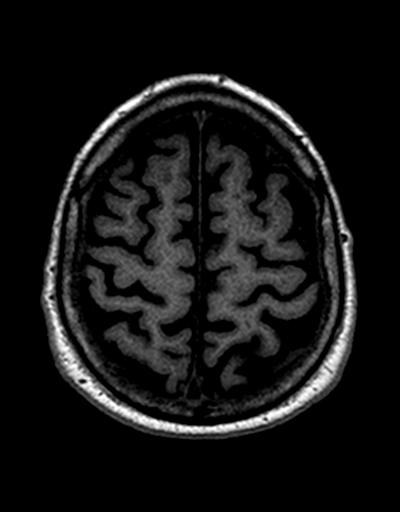
[im 144/144]
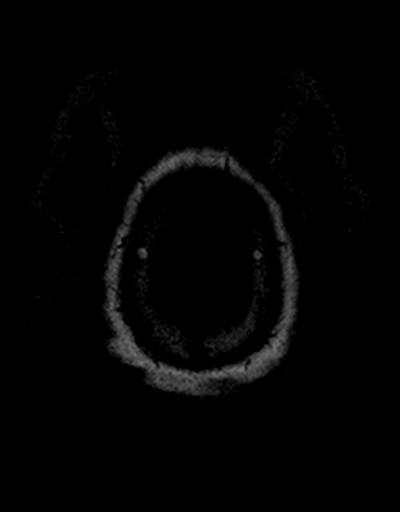

[Series 11: FLAIR · axial · 3.0mm · 0.45mm/px · z∈[-31,+107]mm · 4 of 48 slices shown]
[im 1/48]
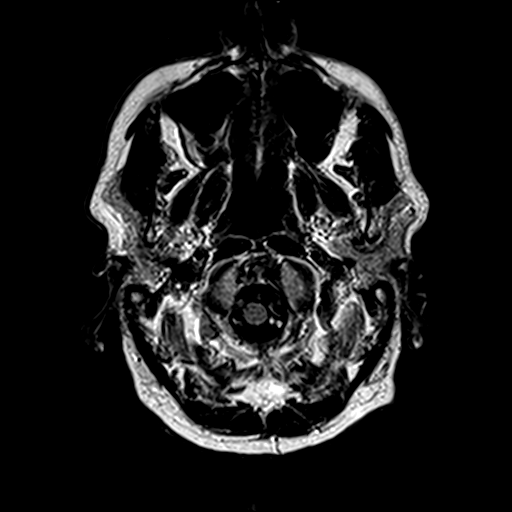
[im 16/48]
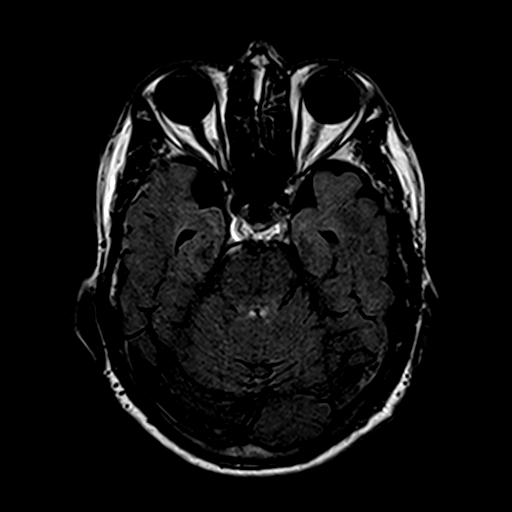
[im 32/48]
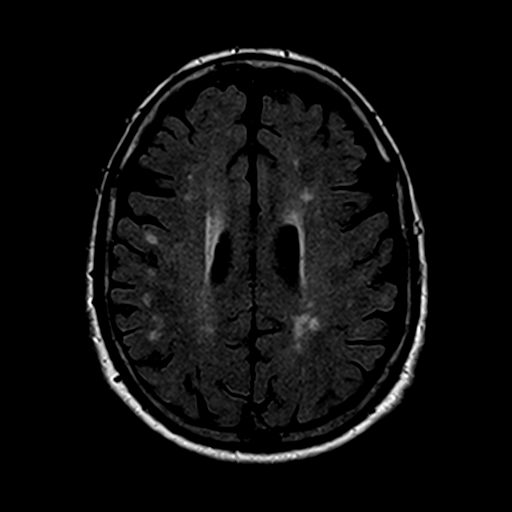
[im 48/48]
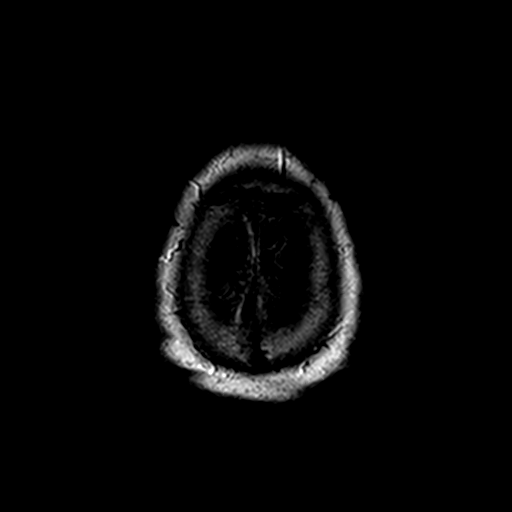

[Series 13: T2 · coronal · 5.0mm · 0.45mm/px · 2 of 26 slices shown (2 of 2)]
[im 1/26]
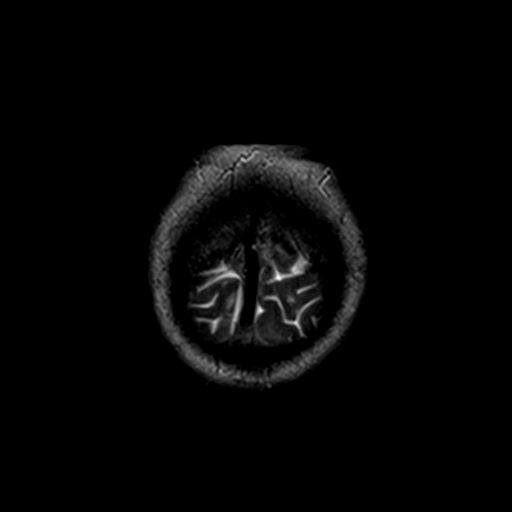
[im 26/26]
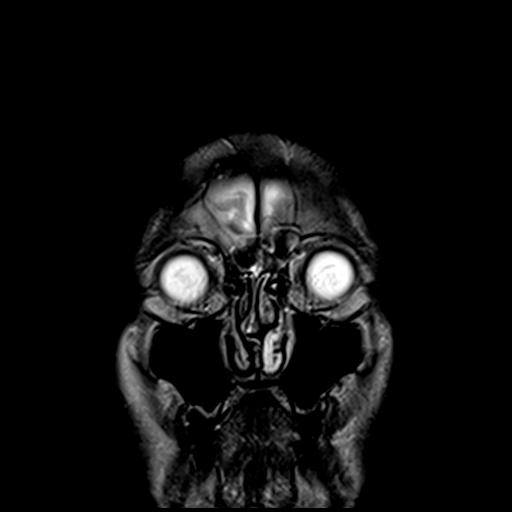

[45 of 48 positions shown; findings below may reference images not displayed]

FINDINGS: Brain: No focal diffusion restriction to indicate acute infarct. No
intraparenchymal hemorrhage. There is multifocal hyperintense
T2-weighted signal within the periventricular, deep and subcortical
white matter, most often seen in the setting of chronic
microvascular ischemia. No mass lesion or midline shift. No
hydrocephalus or extra-axial fluid collection. The midline
structures are normal. No age advanced or lobar predominant atrophy.

Vascular: Major intracranial arterial and venous sinus flow voids
are preserved. No evidence of chronic microhemorrhage or amyloid
angiopathy.

Skull and upper cervical spine: The visualized skull base,
calvarium, upper cervical spine and extracranial soft tissues are
normal.

Sinuses/Orbits: No fluid levels or advanced mucosal thickening. No
mastoid effusion. Normal orbits.
IMPRESSION: 1. No acute intracranial abnormality.
2. Chronic microvascular ischemia.

## 2018-12-22 DIAGNOSIS — C884 Extranodal marginal zone B-cell lymphoma of mucosa-associated lymphoid tissue [MALT-lymphoma]: Secondary | ICD-10-CM | POA: Diagnosis not present

## 2019-01-02 ENCOUNTER — Telehealth: Payer: Self-pay | Admitting: Nurse Practitioner

## 2019-01-02 NOTE — Telephone Encounter (Signed)
Left message for patient to call back regarding appointment with Dr. Acie Fredrickson on 5/21. Need to change time if patient keeps appointment for that day

## 2019-01-04 NOTE — Telephone Encounter (Signed)
Spoke with patient's wife, Arbie Cookey, to confirm patient's virtual appointment on 5/21 with Dr. Acie Fredrickson. Patient will have vital signs ready for the visit. She was thankful for the call.

## 2019-01-04 NOTE — Telephone Encounter (Signed)
Follow up   Patient is returning your call per after hours voice message. Please call the patient.

## 2019-01-08 ENCOUNTER — Telehealth: Payer: Self-pay | Admitting: Cardiovascular Disease

## 2019-01-08 NOTE — Telephone Encounter (Signed)
Left detailed message on patient's voice mail that my call to him last week was regarding his virtual appointment on 5/21 with Dr. Acie Fredrickson. I advised that I spoke with his wife to confirm the appointment and that we look forward to talking with them on Thursday.

## 2019-01-08 NOTE — Telephone Encounter (Signed)
New Message    Pt says he is returning a call for [facility]   Please call back

## 2019-01-10 NOTE — Progress Notes (Signed)
Virtual Visit via Video Note   This visit type was conducted due to national recommendations for restrictions regarding the COVID-19 Pandemic (e.g. social distancing) in an effort to limit this patient's exposure and mitigate transmission in our community.  Due to his co-morbid illnesses, this patient is at least at moderate risk for complications without adequate follow up.  This format is felt to be most appropriate for this patient at this time.  All issues noted in this document were discussed and addressed.  A limited physical exam was performed with this format.  Please refer to the patient's chart for his consent to telehealth for [facility].   Date:  01/10/2019   ID:  Julian [name], DOB [birth year], MRN [id number]  Patient Location: Home Provider Location: Office  PCP:  [name], MD  Cardiologist:  [name], MD  Electrophysiologist:  None   Evaluation Performed:  Follow-Up Visit  1. Hypertension  Mr. Gassman is an 85 yo , hx of HTN. Works out regularly. Does the elliptical without any difficulty. Has had some mild episodes of faint feeling. Occurred during Exercising. Occasionally has orthostatic hypotension. A month ago. He was driving and felt a bit faint. Thought that he might need to pull over to the side of the road. Drinks water while exercising,  Episodes are not always related to exercise  6-7 years ago he had an episode of very high BP. He was started on BP meds at that time. Resolved after several hours,    Non smoker ETOH 2 drinks a day Fhx: +, mother died at 73, father died age 27 , sister died of complications related to alzheimers. , brother had valve replacement. Retired from [employer].  Moved from [place], [place] several years ago. [place].    10/23/2014: Julian [name] is a 85 y.o. male who presents for follow-up for his high blood pressure.  we decreased the amlodipine due to some orthostatic  hypotension. BP has been ok at home.   Eats well most of the time.  Occasionally gets a bit of extra salt.   Still very active.    April 22, 2015:  still having some orthostatic hypotension.  BP is normal at  Has had some occasional palpitations  - also sees it registers on his BP cuff. Gives an error message .  His amlodipine dose was reduced about a year ago .  Still has episodes of dizziness - only lasts about 2-3 seconds  These episodes can occur while seated - and even had occurred while driving  Do not occur when he has been exercising .  Does the elliptical machine for 40 minutes without any CP or dyspnea, no dizziness  Then works out with [employer].   07/09/2015:  [name] is seen today for variable BP.   He had some orthostatic hypotension symptoms ,  We had stopped his amlodipine.  BP increased slightly .   We started back Amlodipine at 2. 5 mg a day  He brought his blood pressure log with him today. Most of his readings are mildly to moderately elevated.  October 27, 2015  [name] is doing well.  Brought his BP log. Most of his readings are normal .  No CP or dyspnea.   No dizziness since we  Adjusted his amlodipine therapy Is through with his XRT therapy   Oct. 27, 2017:  Julian [name] is seen for follow up visit . Has been having some BP variability and some dizzy  spells.  He has held his Amlodipine in the past ( early 2016) but we then restarted it at 2.5 mg a day for some high BP readings.   I have reviewed his home BP log. He has had some BP variability ,   Occasional BP of 140s and 150s.   Leaving for [place] on Nov. 8.   ( [name] for 5 days, then a cruise)   He was sick yesterday and had diarrhea all day - did not eat much . He has had some dizziness this am ( and BP is found to be 92/44 today in the office )  Feeling better now.   Feb.  1, 2018:  Had a great visit to [place] several months .  Has been having some HTN issues. Took Losartan 50  mg  this am ( instead of 1/2 tablet )  Had a TIA like symptom. Could not speak clearly ,   Started on ASA 81 mg that day   January 26, 2017:  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  Had XRT initially   Will be starting chemo  He has changed his diet - no salt and no canned foods.   Sept, 4, 2018:   [name] is seen today  Brought his BP log. Many normal readings BP is generally a bit higher than he would like   July 29, 2017:  Overall doing well. Has changed to Benicar from Losartan which seems to be doing well  Brought his BP log. Readings look great  Has lost some weight.   October 18, 2017: [name] is seen for follow-up.  He was not able to get  olmesartan.  He is back on losartan.  Blood pressure readings are fairly well controlled. Statin was recently stopped due to interaction with synthroid  Feeling well  No CP or dyspnea Has occasional CP when lying down.   Does not occur when walking   Oct. 23, 2019:  Doing well.   BP  Is well controlled  Is back on Losartan .  Still losing weight Is getting XRT for his non hodgkins lymphoma  Also getting modified chemo - takes amlodipine 1.25 mg a day   Overall feels well      Jan 11, 2019   Chief Complaint:  HTN  Julian [name] is a 85 y.o. male with HTN .  Also hx of Occular lymphoma .  No aches or pains.       The patient does not have symptoms concerning for COVID-19 infection (fever, chills, cough, or new shortness of breath).    Past Medical History:  Diagnosis Date  . Anemia   . Hyperlipidemia   . Hypertension   . Prostate cancer [facility])    Past Surgical History:  Procedure Laterality Date  . CARPAL TUNNEL RELEASE Left   . HERNIA REPAIR     MID 80'S  . KNEE SURGERY Left   . PROSTATECTOMY  2001     No outpatient medications have been marked as taking for the 01/11/19 encounter (Appointment) with Rayan Dyal, Wonda Cheng, MD.      Allergies:   Patient has no known allergies.   Social History   Tobacco Use  . Smoking status: Former Smoker    Last attempt to quit: 09/30/1981    Years since quitting: 37.3  . Smokeless tobacco: Never Used  Substance Use Topics  . Alcohol use: Yes  Comment: 5 oz a week  . Drug use: No     Family Hx: The patient's family history includes Alzheimer's disease in his brother; CAD in his father and mother; Dementia in his sister; Heart attack in his sister.  ROS:   Please see the history of present illness.     All other systems reviewed and are negative.   Prior CV studies:   The following studies were reviewed today:    Labs/Other Tests and Data Reviewed:    EKG:  No ECG reviewed.  Recent Labs: No results found for requested labs within last 8760 hours.   Recent Lipid Panel Lab Results  Component Value Date/Time   CHOL 127 07/09/2015 09:15 AM   TRIG 109 07/09/2015 09:15 AM   HDL 49 07/09/2015 09:15 AM   CHOLHDL 2.6 07/09/2015 09:15 AM   LDLCALC 56 07/09/2015 09:15 AM    Wt Readings from Last 3 Encounters:  06/14/18 146 lb (66.2 kg)  10/18/17 144 lb (65.3 kg)  07/29/17 143 lb 6.4 oz (65 kg)     Objective:    Vital Signs:  There were no vitals taken for this visit.    VITAL SIGNS:  reviewed GEN:  no acute distress EYES:  sclerae anicteric, EOMI - Extraocular Movements Intact RESPIRATORY:  normal respiratory effort, symmetric expansion CARDIOVASCULAR:  no peripheral edema SKIN:  no rash, lesions or ulcers. MUSCULOSKELETAL:  no obvious deformities. NEURO:  alert and oriented x 3, no obvious focal deficit PSYCH:  normal affect  ASSESSMENT & PLAN:    1. HTN:   Was not able to take his bp today  BP is generally 140  Feels very well. Is gaining some weight    COVID-19 Education: The signs and symptoms of COVID-19 were discussed with the patient and how to seek care for testing (follow up with PCP or arrange E-visit).  The importance of social  distancing was discussed today.  Time:   Today, I have spent  16  minutes with the patient with telehealth technology discussing the above problems.     Medication Adjustments/Labs and Tests Ordered: Current medicines are reviewed at length with the patient today.  Concerns regarding medicines are outlined above.   Tests Ordered: No orders of the defined types were placed in this encounter.   Medication Changes: No orders of the defined types were placed in this encounter.   Disposition:  Follow up in 6 month(s)  Signed, [name], MD  01/10/2019 4:37 PM    [facility]

## 2019-01-11 ENCOUNTER — Other Ambulatory Visit: Payer: Self-pay

## 2019-01-11 ENCOUNTER — Ambulatory Visit: Payer: Medicare Other | Admitting: Cardiovascular Disease

## 2019-01-11 ENCOUNTER — Encounter: Payer: Self-pay | Admitting: Cardiovascular Disease

## 2019-01-11 ENCOUNTER — Telehealth (INDEPENDENT_AMBULATORY_CARE_PROVIDER_SITE_OTHER): Payer: Medicare Other | Admitting: Cardiovascular Disease

## 2019-01-11 VITALS — Ht 70.0 in

## 2019-01-11 DIAGNOSIS — I10 Essential (primary) hypertension: Secondary | ICD-10-CM

## 2019-01-11 DIAGNOSIS — Z7189 Other specified counseling: Secondary | ICD-10-CM

## 2019-01-11 NOTE — Patient Instructions (Signed)

## 2019-01-17 ENCOUNTER — Telehealth: Payer: Self-pay | Admitting: Cardiovascular Disease

## 2019-01-17 NOTE — Telephone Encounter (Signed)
Wife of patient called and wanted clarification on how he should be taking his medication

## 2019-01-17 NOTE — Telephone Encounter (Signed)
Patient's wife is wondering if patient needs to be taking amlodipine 2.5 mg PRN for elevated BP. Patient's BP 144/71. Informed patient's wife that usually if BP is over 140/90, that is usually when patient should be taking PRN BP medications. Informed patient's wife that this is low dose of amlodipine, and patient's BP is not really high. Informed patient's wife if patient is having any symptoms with an elevated BP, that he should take it. Informed her if it does not get any higher and he is doing fine, that he should be fine not taking it. Will forward to Dr. Acie Fredrickson and his nurse for further advisement.

## 2019-01-17 NOTE — Telephone Encounter (Signed)
Left message for patient to call back  

## 2019-01-18 NOTE — Telephone Encounter (Signed)
My last note indicated that he was taking 1/2 of the amlodipine ( 1.25 mg ) a day and his BP was great in the office. Is he taking it differently now ?  It may have been too difficult to cut in half He may take the amlodipine PRN if that works for him.   I would agree with taking it for a BP > 140/90.

## 2019-01-24 DIAGNOSIS — D487 Neoplasm of uncertain behavior of other specified sites: Secondary | ICD-10-CM | POA: Diagnosis not present

## 2019-01-24 DIAGNOSIS — C8599 Non-Hodgkin lymphoma, unspecified, extranodal and solid organ sites: Secondary | ICD-10-CM | POA: Diagnosis not present

## 2019-02-02 ENCOUNTER — Telehealth: Payer: Self-pay | Admitting: Cardiovascular Disease

## 2019-02-02 NOTE — Telephone Encounter (Signed)
Patient's wife informed and thanked me for the information.

## 2019-02-02 NOTE — Telephone Encounter (Signed)
New Message   Pt c/o medication issue:  1. Name of Medication: Doxycycline  2. How are you currently taking this medication (dosage and times per day)? 100mg  2 times a day for 30 days  3. Are you having a reaction (difficulty breathing--STAT)?   4. What is your medication issue? Patient was prescribed this medication from another provider. Patients wife is wanting Dr. Acie Fredrickson to weigh that it is safe for him to take. The medicine was prescribed by a eye doctor with [facility]. Please call to discuss.    Doxycycline 100mg 

## 2019-02-02 NOTE — Telephone Encounter (Signed)
Yes,  The addition of the doxycycline  will be fine.

## 2019-03-14 DIAGNOSIS — L218 Other seborrheic dermatitis: Secondary | ICD-10-CM | POA: Diagnosis not present

## 2019-03-14 DIAGNOSIS — L720 Epidermal cyst: Secondary | ICD-10-CM | POA: Diagnosis not present

## 2019-03-14 DIAGNOSIS — D1801 Hemangioma of skin and subcutaneous tissue: Secondary | ICD-10-CM | POA: Diagnosis not present

## 2019-03-14 DIAGNOSIS — L821 Other seborrheic keratosis: Secondary | ICD-10-CM | POA: Diagnosis not present

## 2019-03-14 DIAGNOSIS — L570 Actinic keratosis: Secondary | ICD-10-CM | POA: Diagnosis not present

## 2019-03-14 DIAGNOSIS — Z85828 Personal history of other malignant neoplasm of skin: Secondary | ICD-10-CM | POA: Diagnosis not present

## 2019-03-14 DIAGNOSIS — L819 Disorder of pigmentation, unspecified: Secondary | ICD-10-CM | POA: Diagnosis not present

## 2019-03-14 DIAGNOSIS — D229 Melanocytic nevi, unspecified: Secondary | ICD-10-CM | POA: Diagnosis not present

## 2019-03-14 DIAGNOSIS — L814 Other melanin hyperpigmentation: Secondary | ICD-10-CM | POA: Diagnosis not present

## 2019-03-30 DIAGNOSIS — C884 Extranodal marginal zone B-cell lymphoma of mucosa-associated lymphoid tissue [MALT-lymphoma]: Secondary | ICD-10-CM | POA: Diagnosis not present

## 2019-03-30 DIAGNOSIS — N492 Inflammatory disorders of scrotum: Secondary | ICD-10-CM | POA: Diagnosis not present

## 2019-03-30 DIAGNOSIS — H0589 Other disorders of orbit: Secondary | ICD-10-CM | POA: Diagnosis not present

## 2019-04-09 DIAGNOSIS — C8599 Non-Hodgkin lymphoma, unspecified, extranodal and solid organ sites: Secondary | ICD-10-CM | POA: Diagnosis not present

## 2019-04-09 DIAGNOSIS — H353131 Nonexudative age-related macular degeneration, bilateral, early dry stage: Secondary | ICD-10-CM | POA: Diagnosis not present

## 2019-04-09 DIAGNOSIS — Z961 Presence of intraocular lens: Secondary | ICD-10-CM | POA: Diagnosis not present

## 2019-04-11 ENCOUNTER — Other Ambulatory Visit: Payer: Self-pay | Admitting: Cardiovascular Disease

## 2019-04-20 DIAGNOSIS — E7849 Other hyperlipidemia: Secondary | ICD-10-CM | POA: Diagnosis not present

## 2019-04-20 DIAGNOSIS — Z23 Encounter for immunization: Secondary | ICD-10-CM | POA: Diagnosis not present

## 2019-04-26 DIAGNOSIS — Z23 Encounter for immunization: Secondary | ICD-10-CM | POA: Diagnosis not present

## 2019-05-01 DIAGNOSIS — C8599 Non-Hodgkin lymphoma, unspecified, extranodal and solid organ sites: Secondary | ICD-10-CM | POA: Diagnosis not present

## 2019-05-01 DIAGNOSIS — D487 Neoplasm of uncertain behavior of other specified sites: Secondary | ICD-10-CM | POA: Diagnosis not present

## 2019-06-22 DIAGNOSIS — D225 Melanocytic nevi of trunk: Secondary | ICD-10-CM | POA: Diagnosis not present

## 2019-06-22 DIAGNOSIS — D485 Neoplasm of uncertain behavior of skin: Secondary | ICD-10-CM | POA: Diagnosis not present

## 2019-06-22 DIAGNOSIS — C44629 Squamous cell carcinoma of skin of left upper limb, including shoulder: Secondary | ICD-10-CM | POA: Diagnosis not present

## 2019-06-22 DIAGNOSIS — D692 Other nonthrombocytopenic purpura: Secondary | ICD-10-CM | POA: Diagnosis not present

## 2019-06-22 DIAGNOSIS — C44319 Basal cell carcinoma of skin of other parts of face: Secondary | ICD-10-CM | POA: Diagnosis not present

## 2019-06-22 DIAGNOSIS — L570 Actinic keratosis: Secondary | ICD-10-CM | POA: Diagnosis not present

## 2019-06-29 DIAGNOSIS — C884 Extranodal marginal zone B-cell lymphoma of mucosa-associated lymphoid tissue [MALT-lymphoma]: Secondary | ICD-10-CM | POA: Diagnosis not present

## 2019-07-13 DIAGNOSIS — C884 Extranodal marginal zone B-cell lymphoma of mucosa-associated lymphoid tissue [MALT-lymphoma]: Secondary | ICD-10-CM | POA: Diagnosis not present

## 2019-07-31 DIAGNOSIS — C44629 Squamous cell carcinoma of skin of left upper limb, including shoulder: Secondary | ICD-10-CM | POA: Diagnosis not present

## 2019-07-31 DIAGNOSIS — L570 Actinic keratosis: Secondary | ICD-10-CM | POA: Diagnosis not present

## 2019-09-03 ENCOUNTER — Encounter: Payer: Self-pay | Admitting: Cardiovascular Disease

## 2019-09-03 NOTE — Progress Notes (Signed)
Cardiology Office Note   Date:  09/03/2019   ID:  Julian [name], DOB 10-28-1925, MRN [id number]  PCP:  [name], MD  Cardiologist:   [name], MD   Chief Complaint  Patient presents with  . Hypertension   1. Hypertension  Julian [name] is an 86 yo , hx of HTN. Works out regularly. Does the elliptical without any difficulty. Has had some mild episodes of faint feeling. Occurred during Exercising. Occasionally has orthostatic hypotension. A month ago. He was driving and felt a bit faint. Thought that he might need to pull over to the side of the road. Drinks water while exercising,  Episodes are not always related to exercise  6-7 years ago he had an episode of very high BP. He was started on BP meds at that time. Resolved after several hours,   Non smoker ETOH 2 drinks a day Fhx: +, mother died at 75, father died age 10 , sister died of complications related to alzheimers. , brother had valve replacement. Retired from [employer].  Moved from [place], [place] several years ago. [place].    10/23/2014: Julian [name] is a 86 y.o. male who presents for follow-up for his high blood pressure.  we decreased the amlodipine due to some orthostatic hypotension. BP has been ok at home.   Eats well most of the time.  Occasionally gets a bit of extra salt.   Still very active.    April 22, 2015:  still having some orthostatic hypotension.  BP is normal at  Has had some occasional palpitations  - also sees it registers on his BP cuff. Gives an error message .  His amlodipine dose was reduced about a year ago .  Still has episodes of dizziness - only lasts about 2-3 seconds  These episodes can occur while seated - and even had occurred while driving  Do not occur when he has been exercising .  Does the elliptical machine for 40 minutes without any CP or dyspnea, no dizziness  Then works out with [employer].   07/09/2015:  Julian [name] is seen  today for variable BP.   He had some orthostatic hypotension symptoms ,  We had stopped his amlodipine.  BP increased slightly .   We started back Amlodipine at 2. 5 mg a day  He brought his blood pressure log with him today. Most of his readings are mildly to moderately elevated.  October 27, 2015  Julian [name] is doing well.  Brought his BP log. Most of his readings are normal .  No CP or dyspnea.   No dizziness since we  Adjusted his amlodipine therapy Is through with his XRT therapy   Oct. 27, 2017:  Julian [name] is seen for follow up visit . Has been having some BP variability and some dizzy spells.  He has held his Amlodipine in the past ( early 2016) but we then restarted it at 2.5 mg a day for some high BP readings.   I have reviewed his home BP log. He has had some BP variability ,   Occasional BP of 140s and 150s.   Leaving for [place] on Nov. 8.   ( [name] for 5 days, then a cruise)   He was sick yesterday and had diarrhea all day - did not eat much . He has had some dizziness this am ( and BP is found to be 92/44 today in the office )  Feeling better now.  Feb.  1, 2018:  Had a great visit to [place] several months .  Has been having some HTN issues. Took Losartan 50  mg this am ( instead of 1/2 tablet )  Had a TIA like symptom. 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  Had XRT initially   Will be starting chemo  He has changed his diet - no salt and no canned foods.   Sept, 4, 2018:   Julian [name] is seen today  Brought his BP log. Many normal readings BP is generally a bit higher than he would like   July 29, 2017:  Overall doing well. Has changed to Benicar from Losartan which seems to be doing well  Brought his BP log. Readings look great  Has lost some weight.   October 18, 2017: Julian [name] is seen for follow-up.  He was not able  to get  olmesartan.  He is back on losartan.  Blood pressure readings are fairly well controlled. Statin was recently stopped due to interaction with synthroid  Feeling well  No CP or dyspnea Has occasional CP when lying down.   Does not occur when walking   Oct. 23, 2019:  Doing well.   BP  Is well controlled  Is back on Losartan .  Still losing weight Is getting XRT for his non hodgkins lymphoma  Also getting modified chemo - takes amlodipine 1.25 mg a day   Overall feels well  Jan. 12, 2021  Julian [name] is seen today for follow up of his HTN Getting XRT for his non-Hodgkins lymphoma  Seen with his wife. Dr. Brigitte Pulse took him off the atorvastatin  LDL is 161.    Still eating bacon and sausage Still walking 2 miles every other day . Is very healthy.      Past Medical History:  Diagnosis Date  . Anemia   . Hyperlipidemia   . Hypertension   . Prostate cancer [facility])     Past Surgical History:  Procedure Laterality Date  . CARPAL TUNNEL RELEASE Left   . HERNIA REPAIR     MID 80'S  . KNEE SURGERY Left   . PROSTATECTOMY  2001     Current Outpatient Medications  Medication Sig Dispense Refill  . amLODipine (NORVASC) 2.5 MG tablet TAKE 1 TABLET BY MOUTH  DAILY AS NEEDED FOR  ELEVATED BLOOD PRESSURE 90 tablet 1  . aspirin EC 81 MG tablet Take 81 mg by mouth daily.    [name] atorvastatin (LIPITOR) 40 MG tablet Take 40 mg by mouth daily.    . Cyanocobalamin (B-12) 5000 MCG CAPS Take 1 tablet by mouth daily.    [name] Fish Oil-Cholecalciferol (FISH OIL + D3 PO) Take 1,400 mg by mouth daily.    . fluticasone (FLONASE) 50 MCG/ACT nasal spray Place 2 sprays into both nostrils as needed for allergies or rhinitis.    [name] losartan (COZAAR) 100 MG tablet TAKE 1 TABLET BY MOUTH  DAILY 90 tablet 3  . Lutein-Zeaxanthin 25-5 MG CAPS Take 1 tablet by mouth daily.     . Multiple Vitamin (MULTIVITAMIN) tablet Take 1 tablet by mouth daily. CENTRUM SILVER     No current facility-administered medications for  this visit.    Allergies:   Patient has no known allergies.    Social History:  The patient  reports that he quit smoking about 37 years ago. He has never used smokeless tobacco. He reports current alcohol use. He reports that he does not use drugs.   Family History:  The patient's family history includes Alzheimer's disease in his brother; CAD in his father and mother; Dementia in his sister; Heart attack in his sister.    ROS:  Please see the history of present illness.   Physical Exam: There were no vitals taken for this visit.  GEN:  Well nourished, well developed in no acute distress HEENT: Normal NECK: No JVD; No carotid bruits LYMPHATICS: No lymphadenopathy CARDIAC: RRR , no murmurs, rubs, gallops RESPIRATORY:  Clear to auscultation without rales, wheezing or rhonchi  ABDOMEN: Soft, non-tender, non-distended MUSCULOSKELETAL:  No edema; No deformity  SKIN: Warm and dry NEUROLOGIC:  Alert and oriented x 3  EKG: September 04, 2019.  Normal sinus rhythm at 61.  First-degree AV block.  Right bundle branch block.  Recent Labs: No results found for requested labs within last 8760 hours.    Lipid Panel    Component Value Date/Time   CHOL 127 07/09/2015 0915   TRIG 109 07/09/2015 0915   HDL 49 07/09/2015 0915   CHOLHDL 2.6 07/09/2015 0915   VLDL 22 07/09/2015 0915   LDLCALC 56 07/09/2015 0915      Wt Readings from Last 3 Encounters:  06/14/18 146 lb (66.2 kg)  10/18/17 144 lb (65.3 kg)  07/29/17 143 lb 6.4 oz (65 kg)    ECG:    Other studies Reviewed: Additional studies/ records that were reviewed today include:  Review of the above records demonstrates:    ASSESSMENT AND PLAN:  1.  Essential hypertension:-   Blood pressures fairly well controlled for him.  We tried over the years to achieve a blood pressure in the 120s but he has always had syncope/presyncope with this.  He does much better with a blood pressure in the 140s to 150s.  I suspect that he has a  moderate degree of cerebrovascular disease that necessitates higher than normal blood pressure.  He seems to be doing quite well on his current medical regimens.  2. Aortic sclerosis:  -Stable.  3. Tricuspid regurgitation:    4. Hyperlipidemia:    His lipid levels have increased since stopping his atorvastatin.  His LDL is 161.  Despite being 86 years old, he is still incredibly healthy walks 2 miles every other day.  He has a family history of longevity and full expect that he could live to be over 15 years old.  I think we should restart his atorvastatin.  We will restart atorvastatin at 40 mg a day.  We will check lipid liver and BMET  in 3 months.  5.  RBBB :   Stable   Current medicines are reviewed at length with the patient today.  The patient does not have concerns regarding medicines   Disposition:   FU with an APP  in 6 months        Signed, [name], MD  09/03/2019 [facility] [place], [place], [place]  [address] Phone: [phone]; Fax: [phone]

## 2019-09-04 ENCOUNTER — Encounter: Payer: Self-pay | Admitting: Cardiovascular Disease

## 2019-09-04 ENCOUNTER — Other Ambulatory Visit: Payer: Self-pay

## 2019-09-04 ENCOUNTER — Ambulatory Visit (INDEPENDENT_AMBULATORY_CARE_PROVIDER_SITE_OTHER): Payer: Medicare Other | Admitting: Cardiovascular Disease

## 2019-09-04 VITALS — BP 152/80 | HR 77 | Ht 70.0 in | Wt 146.8 lb

## 2019-09-04 DIAGNOSIS — I10 Essential (primary) hypertension: Secondary | ICD-10-CM

## 2019-09-04 DIAGNOSIS — I4510 Unspecified right bundle-branch block: Secondary | ICD-10-CM

## 2019-09-04 DIAGNOSIS — E782 Mixed hyperlipidemia: Secondary | ICD-10-CM | POA: Diagnosis not present

## 2019-09-04 MED ORDER — ATORVASTATIN CALCIUM 40 MG PO TABS: 40.0000 mg | ORAL_TABLET | Freq: Every day | ORAL | 3 refills | Status: DC

## 2019-09-04 NOTE — Patient Instructions (Addendum)
Medication Instructions:  Your physician has recommended you make the following change in your medication:  RESTART Atorvastatin (Lipitor) 40 mg once daily  *If you need a refill on your cardiac medications before your next appointment, please call your pharmacy*  Lab Work: Your physician recommends that you return for lab work in: 3 months on Tuesday April 13. You may come in that day anytime between 7:30 am and 4:45 pm You will need to FAST for this appointment - nothing to eat or drink after midnight the night before except water.   If you have labs (blood work) drawn today and your tests are completely normal, you will receive your results only by: [name] MyChart Message (if you have MyChart) OR . A paper copy in the mail If you have any lab test that is abnormal or we need to change your treatment, we will call you to review the results.   Testing/Procedures: None Ordered   Follow-Up: At [facility], you and your health needs are our priority.  As part of our continuing mission to provide you with exceptional heart care, we have created designated Provider Care Teams.  These Care Teams include your primary Cardiologist (physician) and Advanced Practice Providers (APPs -  Physician Assistants and Nurse Practitioners) who all work together to provide you with the care you need, when you need it.  Your next appointment:   6 month(s)  The format for your next appointment:   Either In Person or Virtual  Provider:   Richardson Dopp, PA-C, Robbie Lis, PA-C or Daune Perch, NP

## 2019-09-05 ENCOUNTER — Encounter: Payer: Self-pay | Admitting: Cardiovascular Disease

## 2019-09-21 ENCOUNTER — Ambulatory Visit: Payer: Medicare Other

## 2019-09-28 DIAGNOSIS — Z85828 Personal history of other malignant neoplasm of skin: Secondary | ICD-10-CM | POA: Diagnosis not present

## 2019-09-28 DIAGNOSIS — L570 Actinic keratosis: Secondary | ICD-10-CM | POA: Diagnosis not present

## 2019-09-28 DIAGNOSIS — L821 Other seborrheic keratosis: Secondary | ICD-10-CM | POA: Diagnosis not present

## 2019-09-28 DIAGNOSIS — L905 Scar conditions and fibrosis of skin: Secondary | ICD-10-CM | POA: Diagnosis not present

## 2019-09-29 ENCOUNTER — Ambulatory Visit: Payer: Medicare Other | Attending: Internal Medicine

## 2019-09-29 DIAGNOSIS — Z23 Encounter for immunization: Secondary | ICD-10-CM | POA: Insufficient documentation

## 2019-09-29 NOTE — Progress Notes (Signed)
   Covid-19 Vaccination Clinic  Name:  Julian [name]    MRN: [id number] DOB: 05/23/1926  09/29/2019  Julian [name] was observed post Covid-19 immunization for 15 minutes without incidence. He was provided with Vaccine Information Sheet and instruction to access the V-Safe system.   Julian [name] was instructed to call 911 with any severe reactions post vaccine: [name] Difficulty breathing  . Swelling of your face and throat  . A fast heartbeat  . A bad rash all over your body  . Dizziness and weakness    Immunizations Administered    Name Date Dose VIS Date Route   Pfizer COVID-19 Vaccine 09/29/2019 10:25 AM 0.3 mL 08/03/2019 Intramuscular   Manufacturer: [place]   Lot: [id number]   [place]: [id number]

## 2019-10-03 DIAGNOSIS — Z125 Encounter for screening for malignant neoplasm of prostate: Secondary | ICD-10-CM | POA: Diagnosis not present

## 2019-10-03 DIAGNOSIS — E7849 Other hyperlipidemia: Secondary | ICD-10-CM | POA: Diagnosis not present

## 2019-10-03 DIAGNOSIS — M810 Age-related osteoporosis without current pathological fracture: Secondary | ICD-10-CM | POA: Diagnosis not present

## 2019-10-03 DIAGNOSIS — I10 Essential (primary) hypertension: Secondary | ICD-10-CM | POA: Diagnosis not present

## 2019-10-03 DIAGNOSIS — R82998 Other abnormal findings in urine: Secondary | ICD-10-CM | POA: Diagnosis not present

## 2019-10-05 DIAGNOSIS — C884 Extranodal marginal zone B-cell lymphoma of mucosa-associated lymphoid tissue [MALT-lymphoma]: Secondary | ICD-10-CM | POA: Diagnosis not present

## 2019-10-10 DIAGNOSIS — E785 Hyperlipidemia, unspecified: Secondary | ICD-10-CM | POA: Diagnosis not present

## 2019-10-10 DIAGNOSIS — E039 Hypothyroidism, unspecified: Secondary | ICD-10-CM | POA: Diagnosis not present

## 2019-10-10 DIAGNOSIS — Z1339 Encounter for screening examination for other mental health and behavioral disorders: Secondary | ICD-10-CM | POA: Diagnosis not present

## 2019-10-10 DIAGNOSIS — C884 Extranodal marginal zone B-cell lymphoma of mucosa-associated lymphoid tissue [MALT-lymphoma]: Secondary | ICD-10-CM | POA: Diagnosis not present

## 2019-10-10 DIAGNOSIS — Z1331 Encounter for screening for depression: Secondary | ICD-10-CM | POA: Diagnosis not present

## 2019-10-10 DIAGNOSIS — M810 Age-related osteoporosis without current pathological fracture: Secondary | ICD-10-CM | POA: Diagnosis not present

## 2019-10-10 DIAGNOSIS — Z0000 Encounter for general adult medical examination without abnormal findings: Secondary | ICD-10-CM | POA: Diagnosis not present

## 2019-10-10 DIAGNOSIS — N182 Chronic kidney disease, stage 2 (mild): Secondary | ICD-10-CM | POA: Diagnosis not present

## 2019-10-10 DIAGNOSIS — I129 Hypertensive chronic kidney disease with stage 1 through stage 4 chronic kidney disease, or unspecified chronic kidney disease: Secondary | ICD-10-CM | POA: Diagnosis not present

## 2019-10-10 DIAGNOSIS — Z8546 Personal history of malignant neoplasm of prostate: Secondary | ICD-10-CM | POA: Diagnosis not present

## 2019-10-23 ENCOUNTER — Ambulatory Visit: Payer: Medicare Other

## 2019-10-23 ENCOUNTER — Ambulatory Visit: Payer: Medicare Other | Attending: Internal Medicine

## 2019-10-23 DIAGNOSIS — Z23 Encounter for immunization: Secondary | ICD-10-CM

## 2019-10-23 NOTE — Progress Notes (Signed)
   Covid-19 Vaccination Clinic  Name:  Julian [name]    MRN: [id number] DOB: 06-09-1926  10/23/2019  [name] was observed post Covid-19 immunization for 15 minutes without incident. He was provided with Vaccine Information Sheet and instruction to access the V-Safe system.   Mr. Writer was instructed to call 911 with any severe reactions post vaccine: [name] Difficulty breathing  . Swelling of face and throat  . A fast heartbeat  . A bad rash all over body  . Dizziness and weakness   Immunizations Administered    Name Date Dose VIS Date Route   Pfizer COVID-19 Vaccine 10/23/2019  9:49 AM 0.3 mL 08/03/2019 Intramuscular   Manufacturer: [place]   Lot: [id number]   [place]: [id number]

## 2019-11-15 DIAGNOSIS — C8599 Non-Hodgkin lymphoma, unspecified, extranodal and solid organ sites: Secondary | ICD-10-CM | POA: Diagnosis not present

## 2019-11-30 DIAGNOSIS — C884 Extranodal marginal zone B-cell lymphoma of mucosa-associated lymphoid tissue [MALT-lymphoma]: Secondary | ICD-10-CM | POA: Diagnosis not present

## 2019-11-30 DIAGNOSIS — Z79899 Other long term (current) drug therapy: Secondary | ICD-10-CM | POA: Diagnosis not present

## 2019-12-04 ENCOUNTER — Other Ambulatory Visit: Payer: Medicare Other

## 2019-12-04 ENCOUNTER — Other Ambulatory Visit: Payer: Self-pay

## 2019-12-04 DIAGNOSIS — E782 Mixed hyperlipidemia: Secondary | ICD-10-CM | POA: Diagnosis not present

## 2019-12-04 DIAGNOSIS — I4510 Unspecified right bundle-branch block: Secondary | ICD-10-CM | POA: Diagnosis not present

## 2019-12-04 DIAGNOSIS — I10 Essential (primary) hypertension: Secondary | ICD-10-CM | POA: Diagnosis not present

## 2019-12-04 LAB — LIPID PANEL
Chol/HDL Ratio: 2.5 ratio (ref 0.0–5.0)
Cholesterol, Total: 177 mg/dL (ref 100–199)
HDL: 71 mg/dL (ref >39)
LDL Chol Calc (NIH): 85 mg/dL (ref 0–99)
Triglycerides: 119 mg/dL (ref 0–149)
VLDL Cholesterol Cal: 21 mg/dL (ref 5–40)

## 2019-12-04 LAB — BASIC METABOLIC PANEL
BUN/Creatinine Ratio: 17 (ref 10–24)
BUN: 16 mg/dL (ref 10–36)
CO2: 24 mmol/L (ref 20–29)
Calcium: 9.3 mg/dL (ref 8.6–10.2)
Chloride: 100 mmol/L (ref 96–106)
Creatinine, Ser: 0.94 mg/dL (ref 0.76–1.27)
GFR calc Af Amer: 81 mL/min/{1.73_m2} (ref >59)
GFR calc non Af Amer: 70 mL/min/{1.73_m2} (ref >59)
Glucose: 100 mg/dL — ABNORMAL HIGH (ref 65–99)
Potassium: 4.5 mmol/L (ref 3.5–5.2)
Sodium: 138 mmol/L (ref 134–144)

## 2019-12-04 LAB — HEPATIC FUNCTION PANEL
ALT: 13 IU/L (ref 0–44)
AST: 17 IU/L (ref 0–40)
Albumin: 4.1 g/dL (ref 3.5–4.6)
Alkaline Phosphatase: 100 IU/L (ref 39–117)
Bilirubin Total: 0.6 mg/dL (ref 0.0–1.2)
Bilirubin, Direct: 0.21 mg/dL (ref 0.00–0.40)
Total Protein: 7.1 g/dL (ref 6.0–8.5)

## 2019-12-06 ENCOUNTER — Telehealth: Payer: Self-pay | Admitting: Cardiovascular Disease

## 2019-12-06 DIAGNOSIS — C884 Extranodal marginal zone B-cell lymphoma of mucosa-associated lymphoid tissue [MALT-lymphoma]: Secondary | ICD-10-CM | POA: Diagnosis not present

## 2019-12-06 DIAGNOSIS — I358 Other nonrheumatic aortic valve disorders: Secondary | ICD-10-CM | POA: Diagnosis not present

## 2019-12-06 DIAGNOSIS — K5989 Other specified functional intestinal disorders: Secondary | ICD-10-CM | POA: Diagnosis not present

## 2019-12-06 NOTE — Telephone Encounter (Signed)
New message ° ° °Patient's wife is returning call for lab results. Please call. °

## 2019-12-06 NOTE — Telephone Encounter (Signed)
Stable lab results reviewed with patient's wife who verbalized understanding. She was grateful for the call. She states the patient is doing well.

## 2019-12-07 DIAGNOSIS — Z79899 Other long term (current) drug therapy: Secondary | ICD-10-CM | POA: Diagnosis not present

## 2019-12-07 DIAGNOSIS — C884 Extranodal marginal zone B-cell lymphoma of mucosa-associated lymphoid tissue [MALT-lymphoma]: Secondary | ICD-10-CM | POA: Diagnosis not present

## 2019-12-07 DIAGNOSIS — Z5111 Encounter for antineoplastic chemotherapy: Secondary | ICD-10-CM | POA: Diagnosis not present

## 2019-12-14 DIAGNOSIS — C884 Extranodal marginal zone B-cell lymphoma of mucosa-associated lymphoid tissue [MALT-lymphoma]: Secondary | ICD-10-CM | POA: Diagnosis not present

## 2019-12-14 DIAGNOSIS — Z5111 Encounter for antineoplastic chemotherapy: Secondary | ICD-10-CM | POA: Diagnosis not present

## 2019-12-14 DIAGNOSIS — R42 Dizziness and giddiness: Secondary | ICD-10-CM | POA: Diagnosis not present

## 2019-12-14 DIAGNOSIS — T451X5A Adverse effect of antineoplastic and immunosuppressive drugs, initial encounter: Secondary | ICD-10-CM | POA: Diagnosis not present

## 2019-12-14 DIAGNOSIS — I10 Essential (primary) hypertension: Secondary | ICD-10-CM | POA: Diagnosis not present

## 2019-12-14 DIAGNOSIS — Z87891 Personal history of nicotine dependence: Secondary | ICD-10-CM | POA: Diagnosis not present

## 2019-12-14 DIAGNOSIS — R519 Headache, unspecified: Secondary | ICD-10-CM | POA: Diagnosis not present

## 2019-12-21 DIAGNOSIS — C884 Extranodal marginal zone B-cell lymphoma of mucosa-associated lymphoid tissue [MALT-lymphoma]: Secondary | ICD-10-CM | POA: Diagnosis not present

## 2019-12-21 DIAGNOSIS — Z5111 Encounter for antineoplastic chemotherapy: Secondary | ICD-10-CM | POA: Diagnosis not present

## 2019-12-28 DIAGNOSIS — Z5111 Encounter for antineoplastic chemotherapy: Secondary | ICD-10-CM | POA: Diagnosis not present

## 2019-12-28 DIAGNOSIS — Z923 Personal history of irradiation: Secondary | ICD-10-CM | POA: Diagnosis not present

## 2019-12-28 DIAGNOSIS — C884 Extranodal marginal zone B-cell lymphoma of mucosa-associated lymphoid tissue [MALT-lymphoma]: Secondary | ICD-10-CM | POA: Diagnosis not present

## 2019-12-28 DIAGNOSIS — Z79899 Other long term (current) drug therapy: Secondary | ICD-10-CM | POA: Diagnosis not present

## 2019-12-28 DIAGNOSIS — H0589 Other disorders of orbit: Secondary | ICD-10-CM | POA: Diagnosis not present

## 2020-01-14 DIAGNOSIS — C44329 Squamous cell carcinoma of skin of other parts of face: Secondary | ICD-10-CM | POA: Diagnosis not present

## 2020-01-14 DIAGNOSIS — D485 Neoplasm of uncertain behavior of skin: Secondary | ICD-10-CM | POA: Diagnosis not present

## 2020-01-25 DIAGNOSIS — C884 Extranodal marginal zone B-cell lymphoma of mucosa-associated lymphoid tissue [MALT-lymphoma]: Secondary | ICD-10-CM | POA: Diagnosis not present

## 2020-01-25 DIAGNOSIS — H0589 Other disorders of orbit: Secondary | ICD-10-CM | POA: Diagnosis not present

## 2020-02-07 DIAGNOSIS — C8599 Non-Hodgkin lymphoma, unspecified, extranodal and solid organ sites: Secondary | ICD-10-CM | POA: Diagnosis not present

## 2020-03-02 ENCOUNTER — Encounter: Payer: Self-pay | Admitting: Cardiovascular Disease

## 2020-03-02 NOTE — Progress Notes (Signed)
Cardiology Office Note   Date:  03/03/2020   ID:  Boris Sharper, DOB October 21, 1925, MRN [id number]  PCP:  [name], MD  Cardiologist:   [name], MD   Chief Complaint  Patient presents with  . Hypertension   1. Hypertension  Mr. Andringa is an 86 yo , hx of HTN. Works out regularly. Does the elliptical without any difficulty. Has had some mild episodes of faint feeling. Occurred during Exercising. Occasionally has orthostatic hypotension. A month ago. He was driving and felt a bit faint. Thought that he might need to pull over to the side of the road. Drinks water while exercising,  Episodes are not always related to exercise  6-7 years ago he had an episode of very high BP. He was started on BP meds at that time. Resolved after several hours,   Non smoker ETOH 2 drinks a day Fhx: +, mother died at 43, father died age 60 , sister died of complications related to alzheimers. , brother had valve replacement. Retired from [employer].  Moved from [place], [place] several years ago. [place].    10/23/2014: Theophilus Walz is a 86 y.o. male who presents for follow-up for his high blood pressure.  we decreased the amlodipine due to some orthostatic hypotension. BP has been ok at home.   Eats well most of the time.  Occasionally gets a bit of extra salt.   Still very active.    April 22, 2015:  still having some orthostatic hypotension.  BP is normal at  Has had some occasional palpitations  - also sees it registers on his BP cuff. Gives an error message .  His amlodipine dose was reduced about a year ago .  Still has episodes of dizziness - only lasts about 2-3 seconds  These episodes can occur while seated - and even had occurred while driving  Do not occur when he has been exercising .  Does the elliptical machine for 40 minutes without any CP or dyspnea, no dizziness  Then works out with [employer].   07/09/2015:  Mr. Mistry is seen  today for variable BP.   He had some orthostatic hypotension symptoms ,  We had stopped his amlodipine.  BP increased slightly .   We started back Amlodipine at 2. 5 mg a day  He brought his blood pressure log with him today. Most of his readings are mildly to moderately elevated.  October 27, 2015  Mr. Guaman is doing well.  Brought his BP log. Most of his readings are normal .  No CP or dyspnea.   No dizziness since we  Adjusted his amlodipine therapy Is through with his XRT therapy   Oct. 27, 2017:  Isack is seen for follow up visit . Has been having some BP variability and some dizzy spells.  He has held his Amlodipine in the past ( early 2016) but we then restarted it at 2.5 mg a day for some high BP readings.   I have reviewed his home BP log. He has had some BP variability ,   Occasional BP of 140s and 150s.   Leaving for [place] on Nov. 8.   ( [name] for 5 days, then a cruise)   He was sick yesterday and had diarrhea all day - did not eat much . He has had some dizziness this am ( and BP is found to be 92/44 today in the office )  Feeling better now.  Feb.  1, 2018:  Had a great visit to [place] several months .  Has been having some HTN issues. Took Losartan 50  mg this am ( instead of 1/2 tablet )  Had a TIA like symptom. Could not speak clearly ,   Started on ASA 81 mg that day   January 26, 2017:  Mr. 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  Had XRT initially   Will be starting chemo  He has changed his diet - no salt and no canned foods.   Sept, 4, 2018:   Jewelz is seen today  Brought his BP log. Many normal readings BP is generally a bit higher than he would like   July 29, 2017:  Overall doing well. Has changed to Benicar from Losartan which seems to be doing well  Brought his BP log. Readings look great  Has lost some weight.   October 18, 2017: Mr. Fluegel is seen for follow-up.  He was not able  to get  olmesartan.  He is back on losartan.  Blood pressure readings are fairly well controlled. Statin was recently stopped due to interaction with synthroid  Feeling well  No CP or dyspnea Has occasional CP when lying down.   Does not occur when walking   Oct. 23, 2019:  Doing well.   BP  Is well controlled  Is back on Losartan .  Still losing weight Is getting XRT for his non hodgkins lymphoma  Also getting modified chemo - takes amlodipine 1.25 mg a day   Overall feels well  Jan. 12, 2021  Dardan is seen today for follow up of his HTN Getting XRT for his non-Hodgkins lymphoma  Seen with his wife. Dr. Brigitte Pulse took him off the atorvastatin  LDL is 161.    Still eating bacon and sausage Still walking 2 miles every other day . Is very healthy.    March 03, 2020 Rapid seen today for follow-up of his hypertension.  He has a history of non-Hodgkin's lymphoma.  He also has a history of hyperlipidemia.  Feeling well .  Has completed his chemo for non - Hodgkins lymphoma  Had an episode of loss of ability to talk clearly  No cp or dyspnea Has lost some weight   Past Medical History:  Diagnosis Date  . Anemia   . Hyperlipidemia   . Hypertension   . Prostate cancer [facility])     Past Surgical History:  Procedure Laterality Date  . CARPAL TUNNEL RELEASE Left   . HERNIA REPAIR     MID 80'S  . KNEE SURGERY Left   . PROSTATECTOMY  2001     Current Outpatient Medications  Medication Sig Dispense Refill  . amLODipine (NORVASC) 2.5 MG tablet Take 1.25 mg by mouth daily.    [name] aspirin EC 81 MG tablet Take 81 mg by mouth daily.    [name] atorvastatin (LIPITOR) 40 MG tablet Take 1 tablet (40 mg total) by mouth daily. 90 tablet 3  . Cholecalciferol (VITAMIN D3) 125 MCG (5000 UT) CAPS Take 1 capsule by mouth daily.    . Cyanocobalamin (B-12) 5000 MCG CAPS Take 1 tablet by mouth daily.    [name] Fish Oil-Cholecalciferol (FISH OIL + D3 PO) Take 1,400 mg by mouth daily.    . fluticasone (FLONASE)  50 MCG/ACT nasal spray Place 2 sprays into both nostrils as needed for allergies or rhinitis.    [name]  losartan (COZAAR) 100 MG tablet TAKE 1 TABLET BY MOUTH  DAILY 90 tablet 3  . Lutein-Zeaxanthin 25-5 MG CAPS Take 1 tablet by mouth daily.     . Multiple Vitamin (MULTIVITAMIN) tablet Take 1 tablet by mouth daily. CENTRUM SILVER    . zinc gluconate 50 MG tablet Take 50 mg by mouth daily.     No current facility-administered medications for this visit.    Allergies:   Patient has no known allergies.    Social History:  The patient  reports that he quit smoking about 38 years ago. He has never used smokeless tobacco. He reports current alcohol use. He reports that he does not use drugs.   Family History:  The patient's family history includes Alzheimer's disease in his brother; CAD in his father and mother; Dementia in his sister; Heart attack in his sister.    ROS:  Please see the history of present illness.   Physical Exam: Blood pressure (!) 125/55, pulse 70, height 5\' 10"  (1.778 m), weight 138 lb (62.6 kg), SpO2 98 %.  GEN:  Well nourished, well developed in no acute distress HEENT: Normal NECK: No JVD; No carotid bruits LYMPHATICS: No lymphadenopathy CARDIAC: RRR   RESPIRATORY:  Clear to auscultation without rales, wheezing or rhonchi  ABDOMEN: Soft, non-tender, non-distended MUSCULOSKELETAL:  No edema; No deformity  SKIN: Warm and dry NEUROLOGIC:  Alert and oriented x 3   EKG:    Recent Labs: 12/04/2019: ALT 13; BUN 16; Creatinine, Ser 0.94; Potassium 4.5; Sodium 138    Lipid Panel    Component Value Date/Time   CHOL 177 12/04/2019 0810   TRIG 119 12/04/2019 0810   HDL 71 12/04/2019 0810   CHOLHDL 2.5 12/04/2019 0810   CHOLHDL 2.6 07/09/2015 0915   VLDL 22 07/09/2015 0915   LDLCALC 85 12/04/2019 0810      Wt Readings from Last 3 Encounters:  03/03/20 138 lb (62.6 kg)  09/04/19 146 lb 12.8 oz (66.6 kg)  06/14/18 146 lb (66.2 kg)    ECG:    Other studies  Reviewed: Additional studies/ records that were reviewed today include:  Review of the above records demonstrates:    ASSESSMENT AND PLAN:  1.  Essential hypertension:-   bp is very well controlled on a very low dose of amlodipine .   I would continue the same low dose for now.   2. Aortic sclerosis:  -  Stable   3. Tricuspid regurgitation:    4. Hyperlipidemia:     - followed by primary md    5.  RBBB :       Current medicines are reviewed at length with the patient today.  The patient does not have concerns regarding medicines   Disposition:   FU in 6 months      Signed, [name], MD  03/03/2020 3:46 PM    [facility] [place], [place], [place]  [address] Phone: [phone]; Fax: [phone]

## 2020-03-03 ENCOUNTER — Ambulatory Visit (INDEPENDENT_AMBULATORY_CARE_PROVIDER_SITE_OTHER): Payer: Medicare Other | Admitting: Cardiovascular Disease

## 2020-03-03 ENCOUNTER — Other Ambulatory Visit: Payer: Self-pay

## 2020-03-03 ENCOUNTER — Encounter: Payer: Self-pay | Admitting: Cardiovascular Disease

## 2020-03-03 VITALS — BP 125/55 | HR 70 | Ht 70.0 in | Wt 138.0 lb

## 2020-03-03 DIAGNOSIS — I10 Essential (primary) hypertension: Secondary | ICD-10-CM | POA: Diagnosis not present

## 2020-03-03 NOTE — Patient Instructions (Signed)
Medication Instructions:  Your physician recommends that you continue on your current medications as directed. Please refer to the Current Medication list given to you today.  *If you need a refill on your cardiac medications before your next appointment, please call your pharmacy*   Lab Work: None Ordered If you have labs (blood work) drawn today and your tests are completely normal, you will receive your results only by: MyChart Message (if you have MyChart) OR A paper copy in the mail If you have any lab test that is abnormal or we need to change your treatment, we will call you to review the results.   Testing/Procedures: None Ordered   Follow-Up: At [facility], you and your health needs are our priority.  As part of our continuing mission to provide you with exceptional heart care, we have created designated Provider Care Teams.  These Care Teams include your primary Cardiologist (physician) and Advanced Practice Providers (APPs -  Physician Assistants and Nurse Practitioners) who all work together to provide you with the care you need, when you need it.   Your next appointment:   6 month(s)  The format for your next appointment:   In Person  Provider:   You may see Philip Nahser, MD or one of the following Advanced Practice Providers on your designated Care Team:   Scott Weaver, PA-C Vin Bhagat, PA-C   

## 2020-04-03 DIAGNOSIS — L905 Scar conditions and fibrosis of skin: Secondary | ICD-10-CM | POA: Diagnosis not present

## 2020-04-03 DIAGNOSIS — D229 Melanocytic nevi, unspecified: Secondary | ICD-10-CM | POA: Diagnosis not present

## 2020-04-03 DIAGNOSIS — L720 Epidermal cyst: Secondary | ICD-10-CM | POA: Diagnosis not present

## 2020-04-03 DIAGNOSIS — D692 Other nonthrombocytopenic purpura: Secondary | ICD-10-CM | POA: Diagnosis not present

## 2020-04-03 DIAGNOSIS — D485 Neoplasm of uncertain behavior of skin: Secondary | ICD-10-CM | POA: Diagnosis not present

## 2020-04-03 DIAGNOSIS — Z85828 Personal history of other malignant neoplasm of skin: Secondary | ICD-10-CM | POA: Diagnosis not present

## 2020-04-03 DIAGNOSIS — L570 Actinic keratosis: Secondary | ICD-10-CM | POA: Diagnosis not present

## 2020-04-03 DIAGNOSIS — C44729 Squamous cell carcinoma of skin of left lower limb, including hip: Secondary | ICD-10-CM | POA: Diagnosis not present

## 2020-04-14 DIAGNOSIS — D0472 Carcinoma in situ of skin of left lower limb, including hip: Secondary | ICD-10-CM | POA: Diagnosis not present

## 2020-04-15 DIAGNOSIS — H353131 Nonexudative age-related macular degeneration, bilateral, early dry stage: Secondary | ICD-10-CM | POA: Diagnosis not present

## 2020-04-15 DIAGNOSIS — C8599 Non-Hodgkin lymphoma, unspecified, extranodal and solid organ sites: Secondary | ICD-10-CM | POA: Diagnosis not present

## 2020-04-22 ENCOUNTER — Ambulatory Visit: Payer: Medicare Other | Attending: Critical Care Medicine

## 2020-04-22 DIAGNOSIS — Z23 Encounter for immunization: Secondary | ICD-10-CM

## 2020-04-22 NOTE — Progress Notes (Signed)
   Covid-19 Vaccination Clinic  Name:  Pearson Picou    MRN: [id number] DOB: 10/10/1925  04/22/2020  Mr. Adkins was observed post Covid-19 immunization for 15 minutes without incident. He was provided with Vaccine Information Sheet and instruction to access the V-Safe system.   Mr. Heatherly was instructed to call 911 with any severe reactions post vaccine: [name] Difficulty breathing  . Swelling of face and throat  . A fast heartbeat  . A bad rash all over body  . Dizziness and weakness

## 2020-06-26 DIAGNOSIS — Z23 Encounter for immunization: Secondary | ICD-10-CM | POA: Diagnosis not present

## 2020-07-14 ENCOUNTER — Other Ambulatory Visit: Payer: Self-pay | Admitting: Cardiovascular Disease

## 2020-07-14 NOTE — Telephone Encounter (Signed)
rx refill

## 2020-08-14 DIAGNOSIS — C8599 Non-Hodgkin lymphoma, unspecified, extranodal and solid organ sites: Secondary | ICD-10-CM | POA: Diagnosis not present

## 2020-09-14 ENCOUNTER — Other Ambulatory Visit: Payer: Self-pay | Admitting: Cardiovascular Disease

## 2020-10-02 DIAGNOSIS — Z23 Encounter for immunization: Secondary | ICD-10-CM | POA: Diagnosis not present

## 2020-10-02 DIAGNOSIS — C884 Extranodal marginal zone B-cell lymphoma of mucosa-associated lymphoid tissue [MALT-lymphoma]: Secondary | ICD-10-CM | POA: Diagnosis not present

## 2020-10-06 DIAGNOSIS — Z85828 Personal history of other malignant neoplasm of skin: Secondary | ICD-10-CM | POA: Diagnosis not present

## 2020-10-06 DIAGNOSIS — C44622 Squamous cell carcinoma of skin of right upper limb, including shoulder: Secondary | ICD-10-CM | POA: Diagnosis not present

## 2020-10-06 DIAGNOSIS — L905 Scar conditions and fibrosis of skin: Secondary | ICD-10-CM | POA: Diagnosis not present

## 2020-10-06 DIAGNOSIS — L820 Inflamed seborrheic keratosis: Secondary | ICD-10-CM | POA: Diagnosis not present

## 2020-10-06 DIAGNOSIS — C44612 Basal cell carcinoma of skin of right upper limb, including shoulder: Secondary | ICD-10-CM | POA: Diagnosis not present

## 2020-10-06 DIAGNOSIS — D485 Neoplasm of uncertain behavior of skin: Secondary | ICD-10-CM | POA: Diagnosis not present

## 2020-10-07 DIAGNOSIS — M810 Age-related osteoporosis without current pathological fracture: Secondary | ICD-10-CM | POA: Diagnosis not present

## 2020-10-07 DIAGNOSIS — E785 Hyperlipidemia, unspecified: Secondary | ICD-10-CM | POA: Diagnosis not present

## 2020-10-07 DIAGNOSIS — E039 Hypothyroidism, unspecified: Secondary | ICD-10-CM | POA: Diagnosis not present

## 2020-10-14 DIAGNOSIS — Z1331 Encounter for screening for depression: Secondary | ICD-10-CM | POA: Diagnosis not present

## 2020-10-14 DIAGNOSIS — M810 Age-related osteoporosis without current pathological fracture: Secondary | ICD-10-CM | POA: Diagnosis not present

## 2020-10-14 DIAGNOSIS — G300 Alzheimer's disease with early onset: Secondary | ICD-10-CM | POA: Diagnosis not present

## 2020-10-14 DIAGNOSIS — Z125 Encounter for screening for malignant neoplasm of prostate: Secondary | ICD-10-CM | POA: Diagnosis not present

## 2020-10-14 DIAGNOSIS — N182 Chronic kidney disease, stage 2 (mild): Secondary | ICD-10-CM | POA: Diagnosis not present

## 2020-10-14 DIAGNOSIS — C8599 Non-Hodgkin lymphoma, unspecified, extranodal and solid organ sites: Secondary | ICD-10-CM | POA: Diagnosis not present

## 2020-10-14 DIAGNOSIS — Z1339 Encounter for screening examination for other mental health and behavioral disorders: Secondary | ICD-10-CM | POA: Diagnosis not present

## 2020-10-14 DIAGNOSIS — I10 Essential (primary) hypertension: Secondary | ICD-10-CM | POA: Diagnosis not present

## 2020-10-14 DIAGNOSIS — E039 Hypothyroidism, unspecified: Secondary | ICD-10-CM | POA: Diagnosis not present

## 2020-10-14 DIAGNOSIS — K529 Noninfective gastroenteritis and colitis, unspecified: Secondary | ICD-10-CM | POA: Diagnosis not present

## 2020-10-14 DIAGNOSIS — C884 Extranodal marginal zone B-cell lymphoma of mucosa-associated lymphoid tissue [MALT-lymphoma]: Secondary | ICD-10-CM | POA: Diagnosis not present

## 2020-10-14 DIAGNOSIS — F0280 Dementia in other diseases classified elsewhere without behavioral disturbance: Secondary | ICD-10-CM | POA: Diagnosis not present

## 2020-10-14 DIAGNOSIS — E785 Hyperlipidemia, unspecified: Secondary | ICD-10-CM | POA: Diagnosis not present

## 2020-10-14 DIAGNOSIS — Z0000 Encounter for general adult medical examination without abnormal findings: Secondary | ICD-10-CM | POA: Diagnosis not present

## 2020-10-14 DIAGNOSIS — I129 Hypertensive chronic kidney disease with stage 1 through stage 4 chronic kidney disease, or unspecified chronic kidney disease: Secondary | ICD-10-CM | POA: Diagnosis not present

## 2020-10-14 DIAGNOSIS — Z8546 Personal history of malignant neoplasm of prostate: Secondary | ICD-10-CM | POA: Diagnosis not present

## 2020-10-14 DIAGNOSIS — R82998 Other abnormal findings in urine: Secondary | ICD-10-CM | POA: Diagnosis not present

## 2020-11-06 DIAGNOSIS — L905 Scar conditions and fibrosis of skin: Secondary | ICD-10-CM | POA: Diagnosis not present

## 2020-11-06 DIAGNOSIS — C44622 Squamous cell carcinoma of skin of right upper limb, including shoulder: Secondary | ICD-10-CM | POA: Diagnosis not present

## 2020-11-06 DIAGNOSIS — C44612 Basal cell carcinoma of skin of right upper limb, including shoulder: Secondary | ICD-10-CM | POA: Diagnosis not present

## 2020-12-03 ENCOUNTER — Other Ambulatory Visit: Payer: Self-pay

## 2020-12-03 ENCOUNTER — Encounter: Payer: Self-pay | Admitting: Cardiovascular Disease

## 2020-12-03 ENCOUNTER — Ambulatory Visit (INDEPENDENT_AMBULATORY_CARE_PROVIDER_SITE_OTHER): Payer: Medicare Other | Admitting: Cardiovascular Disease

## 2020-12-03 VITALS — BP 130/64 | HR 62 | Ht 70.0 in | Wt 138.0 lb

## 2020-12-03 DIAGNOSIS — I10 Essential (primary) hypertension: Secondary | ICD-10-CM | POA: Diagnosis not present

## 2020-12-03 NOTE — Patient Instructions (Signed)

## 2020-12-03 NOTE — Progress Notes (Signed)
Cardiology Office Note   Date:  12/03/2020   ID:  Julian [name], DOB 11-09-1925, MRN [id number]  PCP:  Ginger Organ., MD  Cardiologist:   [name], MD   No chief complaint on file.  1. Hypertension  Julian [name] is an 87 yo , hx of HTN. Works out regularly. Does the elliptical without any difficulty. Has had some mild episodes of faint feeling. Occurred during Exercising. Occasionally has orthostatic hypotension. A month ago. He was driving and felt a bit faint. Thought that he might need to pull over to the side of the road. Drinks water while exercising,  Episodes are not always related to exercise  6-7 years ago he had an episode of very high BP. He was started on BP meds at that time. Resolved after several hours,   Non smoker ETOH 2 drinks a day Fhx: +, mother died at 50, father died age 40 , sister died of complications related to alzheimers. , brother had valve replacement. Retired from [employer].  Moved from [place], [place] several years ago. [place].    10/23/2014: Julian [name] is a 87 y.o. male who presents for follow-up for his high blood pressure.  we decreased the amlodipine due to some orthostatic hypotension. BP has been ok at home.   Eats well most of the time.  Occasionally gets a bit of extra salt.   Still very active.    April 22, 2015:  still having some orthostatic hypotension.  BP is normal at  Has had some occasional palpitations  - also sees it registers on his BP cuff. Gives an error message .  His amlodipine dose was reduced about a year ago .  Still has episodes of dizziness - only lasts about 2-3 seconds  These episodes can occur while seated - and even had occurred while driving  Do not occur when he has been exercising .  Does the elliptical machine for 40 minutes without any CP or dyspnea, no dizziness  Then works out with [employer].   07/09/2015:  Mr. Faircloth is seen today for variable BP.    He had some orthostatic hypotension symptoms ,  We had stopped his amlodipine.  BP increased slightly .   We started back Amlodipine at 2. 5 mg a day  He brought his blood pressure log with him today. Most of his readings are mildly to moderately elevated.  October 27, 2015  Mr. Ottaway is doing well.  Brought his BP log. Most of his readings are normal .  No CP or dyspnea.   No dizziness since we  Adjusted his amlodipine therapy Is through with his XRT therapy   Oct. 27, 2017:  Julian [name] is seen for follow up visit . Has been having some BP variability and some dizzy spells.  He has held his Amlodipine in the past ( early 2016) but we then restarted it at 2.5 mg a day for some high BP readings.   I have reviewed his home BP log. He has had some BP variability ,   Occasional BP of 140s and 150s.   Leaving for [place] on Nov. 8.   ( Julian [name] for 5 days, then a cruise)   He was sick yesterday and had diarrhea all day - did not eat much . He has had some dizziness this am ( and BP is found to be 92/44 today in the office )  Feeling better now.   Feb.  1,  2018:  Had a great visit to [place] several months .  Has been having some HTN issues. Took Losartan 50  mg this am ( instead of 1/2 tablet )  Had a TIA like symptom. 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  Had XRT initially   Will be starting chemo  He has changed his diet - no salt and no canned foods.   Sept, 4, 2018:   Julian [name] is seen today  Brought his BP log. Many normal readings BP is generally a bit higher than he would like   July 29, 2017:  Overall doing well. Has changed to Benicar from Losartan which seems to be doing well  Brought his BP log. Readings look great  Has lost some weight.   October 18, 2017: Julian [name] is seen for follow-up.  He was not able to get  olmesartan.  He  is back on losartan.  Blood pressure readings are fairly well controlled. Statin was recently stopped due to interaction with synthroid  Feeling well  No CP or dyspnea Has occasional CP when lying down.   Does not occur when walking   Oct. 23, 2019:  Doing well.   BP  Is well controlled  Is back on Losartan .  Still losing weight Is getting XRT for his non hodgkins lymphoma  Also getting modified chemo - takes amlodipine 1.25 mg a day   Overall feels well  Jan. 12, 2021  Julian [name] is seen today for follow up of his HTN Getting XRT for his non-Hodgkins lymphoma  Seen with his wife. Dr. Brigitte Pulse took him off the atorvastatin  LDL is 161.    Still eating bacon and sausage Still walking 2 miles every other day . Is very healthy.    March 03, 2020 Rapid seen today for follow-up of his hypertension.  He has a history of non-Hodgkin's lymphoma.  He also has a history of hyperlipidemia.  Feeling well .  Has completed his chemo for non - Hodgkins lymphoma  Had an episode of loss of ability to talk clearly  No cp or dyspnea Has lost some weight   April 13,2022 Julian [name] is seen today for follow up of his HTN,  Seen with wife , Julian [name] to do most of his normal activities.  He is concerned about his weight loss.  Wife Julian [name] gives him a very good diet  Walks 2.1 miles in 35 min.   Julian [name] Has restarted and is Still doing chemo treatments    Past Medical History:  Diagnosis Date  . Anemia   . Hyperlipidemia   . Hypertension   . Prostate cancer Julian [name])     Past Surgical History:  Procedure Laterality Date  . CARPAL TUNNEL RELEASE Left   . HERNIA REPAIR     MID 80'S  . KNEE SURGERY Left   . PROSTATECTOMY  2001     Current Outpatient Medications  Medication Sig Dispense Refill  . amLODipine (NORVASC) 2.5 MG tablet Take 1.25 mg by mouth daily.    Julian [name] aspirin EC 81 MG tablet Take 81 mg by mouth daily.    Julian [name] atorvastatin (LIPITOR) 40 MG tablet TAKE 1 TABLET BY MOUTH  DAILY 90 tablet 2  .  Cholecalciferol (VITAMIN D3) 125 MCG (5000 UT) CAPS Take 1 capsule by  mouth daily.    . Cyanocobalamin (B-12) 5000 MCG CAPS Take 1 tablet by mouth daily.    Julian [name] Fish Oil-Cholecalciferol (FISH OIL + D3 PO) Take 1,400 mg by mouth daily.    . fluticasone (FLONASE) 50 MCG/ACT nasal spray Place 2 sprays into both nostrils as needed for allergies or rhinitis.    Julian [name] losartan (COZAAR) 100 MG tablet TAKE 1 TABLET BY MOUTH  DAILY 90 tablet 3  . Lutein-Zeaxanthin 25-5 MG CAPS Take 1 tablet by mouth daily.     . Multiple Vitamin (MULTIVITAMIN) tablet Take 1 tablet by mouth daily. CENTRUM SILVER    . zinc gluconate 50 MG tablet Take 50 mg by mouth daily.     No current facility-administered medications for this visit.    Allergies:   Patient has no known allergies.    Social History:  The patient  reports that he quit smoking about 39 years ago. He has never used smokeless tobacco. He reports current alcohol use. He reports that he does not use drugs.   Family History:  The patient's family history includes Alzheimer's disease in his brother; CAD in his father and mother; Dementia in his sister; Heart attack in his sister.    ROS:  Please see the history of present illness.    Physical Exam: There were no vitals taken for this visit.  GEN:   eldelry male,   Looks great HEENT: Normal NECK: No JVD; No carotid bruits LYMPHATICS: No lymphadenopathy CARDIAC: RRR , soft systolic murmur. RESPIRATORY:  Clear to auscultation without rales, wheezing or rhonchi  ABDOMEN: Soft, non-tender, non-distended MUSCULOSKELETAL:  No edema; No deformity  SKIN: Warm and dry NEUROLOGIC:  Alert and oriented x 3    EKG:   December 03, 2020   NSR at 91.  RBBB .   Recent Labs: 12/04/2019: ALT 13; BUN 16; Creatinine, Ser 0.94; Potassium 4.5; Sodium 138    Lipid Panel    Component Value Date/Time   CHOL 177 12/04/2019 0810   TRIG 119 12/04/2019 0810   HDL 71 12/04/2019 0810   CHOLHDL 2.5 12/04/2019 0810   CHOLHDL  2.6 07/09/2015 0915   VLDL 22 07/09/2015 0915   LDLCALC 85 12/04/2019 0810      Wt Readings from Last 3 Encounters:  03/03/20 138 lb (62.6 kg)  09/04/19 146 lb 12.8 oz (66.6 kg)  06/14/18 146 lb (66.2 kg)     Other studies Reviewed: Additional studies/ records that were reviewed today include:  Review of the above records demonstrates:    ASSESSMENT AND PLAN:  1.  Essential hypertension:-   Blood pressure is well controlled.  Continue current medications.  He feels well.   2. Aortic sclerosis:  -   3. Tricuspid regurgitation:    4. Hyperlipidemia:      Followed by primary .    5.  RBBB :    Stable    Current medicines are reviewed at length with the patient today.  The patient does not have concerns regarding medicines   Disposition:   FU in 6 months      Signed, [name], MD  12/03/2020 7:25 AM    [facility] [address], [place], [place]  [address] Phone: [phone]; Fax: [phone]

## 2021-01-05 DIAGNOSIS — E785 Hyperlipidemia, unspecified: Secondary | ICD-10-CM | POA: Diagnosis not present

## 2021-01-05 DIAGNOSIS — I129 Hypertensive chronic kidney disease with stage 1 through stage 4 chronic kidney disease, or unspecified chronic kidney disease: Secondary | ICD-10-CM | POA: Diagnosis not present

## 2021-01-05 DIAGNOSIS — N182 Chronic kidney disease, stage 2 (mild): Secondary | ICD-10-CM | POA: Diagnosis not present

## 2021-01-05 DIAGNOSIS — R42 Dizziness and giddiness: Secondary | ICD-10-CM | POA: Diagnosis not present

## 2021-01-05 DIAGNOSIS — G300 Alzheimer's disease with early onset: Secondary | ICD-10-CM | POA: Diagnosis not present

## 2021-01-05 DIAGNOSIS — E039 Hypothyroidism, unspecified: Secondary | ICD-10-CM | POA: Diagnosis not present

## 2021-01-05 DIAGNOSIS — I10 Essential (primary) hypertension: Secondary | ICD-10-CM | POA: Diagnosis not present

## 2021-02-20 DIAGNOSIS — M25512 Pain in left shoulder: Secondary | ICD-10-CM | POA: Diagnosis not present

## 2021-02-20 DIAGNOSIS — G300 Alzheimer's disease with early onset: Secondary | ICD-10-CM | POA: Diagnosis not present

## 2021-02-27 DIAGNOSIS — C8599 Non-Hodgkin lymphoma, unspecified, extranodal and solid organ sites: Secondary | ICD-10-CM | POA: Diagnosis not present

## 2021-02-27 DIAGNOSIS — H353131 Nonexudative age-related macular degeneration, bilateral, early dry stage: Secondary | ICD-10-CM | POA: Diagnosis not present

## 2021-03-26 DIAGNOSIS — C884 Extranodal marginal zone B-cell lymphoma of mucosa-associated lymphoid tissue [MALT-lymphoma]: Secondary | ICD-10-CM | POA: Diagnosis not present

## 2021-03-26 DIAGNOSIS — R5383 Other fatigue: Secondary | ICD-10-CM | POA: Diagnosis not present

## 2021-04-08 DIAGNOSIS — H6121 Impacted cerumen, right ear: Secondary | ICD-10-CM | POA: Diagnosis not present

## 2021-05-01 DIAGNOSIS — C884 Extranodal marginal zone B-cell lymphoma of mucosa-associated lymphoid tissue [MALT-lymphoma]: Secondary | ICD-10-CM | POA: Diagnosis not present

## 2021-05-01 DIAGNOSIS — R918 Other nonspecific abnormal finding of lung field: Secondary | ICD-10-CM | POA: Diagnosis not present

## 2021-05-01 DIAGNOSIS — C8590 Non-Hodgkin lymphoma, unspecified, unspecified site: Secondary | ICD-10-CM | POA: Diagnosis not present

## 2021-05-30 DIAGNOSIS — Z23 Encounter for immunization: Secondary | ICD-10-CM | POA: Diagnosis not present

## 2021-09-09 ENCOUNTER — Other Ambulatory Visit: Payer: Self-pay

## 2021-09-09 ENCOUNTER — Other Ambulatory Visit: Payer: Self-pay | Admitting: Cardiovascular Disease

## 2021-09-09 MED ORDER — LOSARTAN POTASSIUM 100 MG PO TABS: 100.0000 mg | ORAL_TABLET | Freq: Every day | ORAL | 0 refills | Status: DC

## 2021-09-09 NOTE — Telephone Encounter (Signed)
Pt's medication was sent to pt's pharmacy as requested. Confirmation received.  °

## 2021-09-09 NOTE — Telephone Encounter (Signed)
Pt was sent a 15 day supply to local pharmacy as requested until mail order arrives. Confirmation received.

## 2021-10-07 DIAGNOSIS — C8599 Non-Hodgkin lymphoma, unspecified, extranodal and solid organ sites: Secondary | ICD-10-CM | POA: Diagnosis not present

## 2021-10-20 DIAGNOSIS — N182 Chronic kidney disease, stage 2 (mild): Secondary | ICD-10-CM | POA: Diagnosis not present

## 2021-10-20 DIAGNOSIS — Z8546 Personal history of malignant neoplasm of prostate: Secondary | ICD-10-CM | POA: Diagnosis not present

## 2021-10-20 DIAGNOSIS — E785 Hyperlipidemia, unspecified: Secondary | ICD-10-CM | POA: Diagnosis not present

## 2021-10-20 DIAGNOSIS — E039 Hypothyroidism, unspecified: Secondary | ICD-10-CM | POA: Diagnosis not present

## 2021-10-20 DIAGNOSIS — G300 Alzheimer's disease with early onset: Secondary | ICD-10-CM | POA: Diagnosis not present

## 2021-10-20 DIAGNOSIS — M810 Age-related osteoporosis without current pathological fracture: Secondary | ICD-10-CM | POA: Diagnosis not present

## 2021-10-20 DIAGNOSIS — C884 Extranodal marginal zone B-cell lymphoma of mucosa-associated lymphoid tissue [MALT-lymphoma]: Secondary | ICD-10-CM | POA: Diagnosis not present

## 2021-10-20 DIAGNOSIS — F0280 Dementia in other diseases classified elsewhere without behavioral disturbance: Secondary | ICD-10-CM | POA: Diagnosis not present

## 2021-10-20 DIAGNOSIS — Z0000 Encounter for general adult medical examination without abnormal findings: Secondary | ICD-10-CM | POA: Diagnosis not present

## 2021-10-20 DIAGNOSIS — I10 Essential (primary) hypertension: Secondary | ICD-10-CM | POA: Diagnosis not present

## 2021-10-20 DIAGNOSIS — I129 Hypertensive chronic kidney disease with stage 1 through stage 4 chronic kidney disease, or unspecified chronic kidney disease: Secondary | ICD-10-CM | POA: Diagnosis not present

## 2021-10-28 ENCOUNTER — Other Ambulatory Visit: Payer: Self-pay | Admitting: Cardiovascular Disease

## 2021-10-30 DIAGNOSIS — I129 Hypertensive chronic kidney disease with stage 1 through stage 4 chronic kidney disease, or unspecified chronic kidney disease: Secondary | ICD-10-CM | POA: Diagnosis not present

## 2021-10-30 DIAGNOSIS — F0280 Dementia in other diseases classified elsewhere without behavioral disturbance: Secondary | ICD-10-CM | POA: Diagnosis not present

## 2021-10-30 DIAGNOSIS — Z0000 Encounter for general adult medical examination without abnormal findings: Secondary | ICD-10-CM | POA: Diagnosis not present

## 2021-10-30 DIAGNOSIS — Z1389 Encounter for screening for other disorder: Secondary | ICD-10-CM | POA: Diagnosis not present

## 2021-10-30 DIAGNOSIS — I10 Essential (primary) hypertension: Secondary | ICD-10-CM | POA: Diagnosis not present

## 2021-10-30 DIAGNOSIS — N182 Chronic kidney disease, stage 2 (mild): Secondary | ICD-10-CM | POA: Diagnosis not present

## 2021-10-30 DIAGNOSIS — E785 Hyperlipidemia, unspecified: Secondary | ICD-10-CM | POA: Diagnosis not present

## 2021-10-30 DIAGNOSIS — M810 Age-related osteoporosis without current pathological fracture: Secondary | ICD-10-CM | POA: Diagnosis not present

## 2021-10-30 DIAGNOSIS — C8599 Non-Hodgkin lymphoma, unspecified, extranodal and solid organ sites: Secondary | ICD-10-CM | POA: Diagnosis not present

## 2021-10-30 DIAGNOSIS — G300 Alzheimer's disease with early onset: Secondary | ICD-10-CM | POA: Diagnosis not present

## 2021-10-30 DIAGNOSIS — C884 Extranodal marginal zone B-cell lymphoma of mucosa-associated lymphoid tissue [MALT-lymphoma]: Secondary | ICD-10-CM | POA: Diagnosis not present

## 2021-10-30 DIAGNOSIS — Z8546 Personal history of malignant neoplasm of prostate: Secondary | ICD-10-CM | POA: Diagnosis not present

## 2021-10-30 DIAGNOSIS — Z1331 Encounter for screening for depression: Secondary | ICD-10-CM | POA: Diagnosis not present

## 2021-10-30 DIAGNOSIS — E039 Hypothyroidism, unspecified: Secondary | ICD-10-CM | POA: Diagnosis not present

## 2021-11-10 ENCOUNTER — Other Ambulatory Visit: Payer: Self-pay | Admitting: Cardiovascular Disease

## 2021-11-21 ENCOUNTER — Other Ambulatory Visit: Payer: Self-pay | Admitting: Cardiovascular Disease

## 2021-12-17 ENCOUNTER — Encounter: Payer: Self-pay | Admitting: Cardiovascular Disease

## 2021-12-17 NOTE — Progress Notes (Signed)
? ?Cardiology Office Note ? ? ?Date:  12/18/2021  ? ?ID:  Julian [name], DOB Jan 26, 1926, MRN [id number] ? ?PCP:  Ginger Organ., MD  ?Cardiologist:   [name], MD  ? ?Chief Complaint  ?Patient presents with  ? Hypertension  ?   ? ?  ? ?1. Hypertension ? ?[name] is an 88 yo , hx of HTN. ?Works out regularly.  Does the elliptical without any difficulty. ?Has had some mild episodes of faint feeling.  Occurred during  Exercising.  Occasionally has orthostatic hypotension. ?A month ago. He was driving and felt a bit faint.  Thought that he might need to pull over to the side of the road. ?Drinks water while exercising,    ?Episodes are not always related to exercise ? ?6-7 years ago he had an episode of very high BP.  He was started on BP meds at that time. ?Resolved after several hours,   ? ?Non smoker ?ETOH 2 drinks a day ?Fhx:  +, mother died at 71, father died age 24 , sister died of complications related to alzheimers. , brother had valve replacement. ?Retired from [employer].    ?Moved from [place], [place] several years ago.  [place].   ?  ?10/23/2014: ?Julian [name] is a 88 y.o. male who presents for follow-up for his high blood pressure. ? we decreased the amlodipine due to some orthostatic hypotension. ?BP has been ok at home.  ? ?Eats well most of the time.  Occasionally gets a bit of extra salt.   ?Still very active.   ? ?April 22, 2015: ? still having some orthostatic hypotension.  ?BP is normal at ? ?Has had some occasional palpitations  - also sees it registers on his BP cuff. Gives an error message .  ?His amlodipine dose was reduced about a year ago .  ?Still has episodes of dizziness - only lasts about 2-3 seconds  ?These episodes can occur while seated - and even had occurred while driving  ?Do not occur when he has been exercising .  ?Does the elliptical machine for 40 minutes without any CP or dyspnea, no dizziness  ?Then works out with [employer].  ? ?07/09/2015: ? ?Mr.  Julian [name] is seen today for variable BP.   He had some orthostatic hypotension symptoms ,  We had stopped his amlodipine.  ?BP increased slightly .   We started back Amlodipine at 2. 5 mg a day  ?He brought his blood pressure log with him today. Most of his readings are mildly to moderately elevated. ? ?October 27, 2015 ? ?[name] is doing well.  ?Brought his BP log. ?Most of his readings are normal .  No CP or dyspnea.   ?No dizziness since we  Adjusted his amlodipine therapy ?Is through with his XRT therapy  ? ?Oct. 27, 2017: ? ?Julian [name] is seen for follow up visit . ?Has been having some BP variability and some dizzy spells.  ?He has held his Amlodipine in the past ( early 2016) but we then restarted it at 2.5 mg a day for some high BP readings.  ? ?I have reviewed his home BP log. ?He has had some BP variability ,   Occasional BP of 140s and 150s.  ? ?Leaving for [place] on Nov. 8.   ( [name] for 5 days, then a cruise)  ? ?He was sick yesterday and had diarrhea all day - did not eat much . ?He has had some  dizziness this am ( and BP is found to be 92/44 today in the office )  ?Feeling better now.  ? ?Feb.  1, 2018: ? ?Had a great visit to [place] several months .  ?Has been having some HTN issues. ?Took Losartan 50  mg this am ( instead of 1/2 tablet )  ?Had a TIA like symptom. ?Could not speak clearly ,   ?Started on ASA 81 mg that day  ? ?January 26, 2017: ? ?[name] is seen today for follow up visit ?Was diagnosed with Lymphoma I 2016 ( [facility] [facility]) .  Had XRT initially  ? ?Will be starting chemo  ?He has changed his diet - no salt and no canned foods.  ? ?Sept, 4, 2018:  ? ?Coden is seen today  ?Brought his BP log. Many normal readings ?BP is generally a bit higher than he would like  ? ?July 29, 2017: ? ?Overall doing well. ?Has changed to Benicar from Losartan which seems to be doing well  ?Brought his BP log. ?Readings look great  ?Has lost some weight.  ? ?October 18, 2017: ?[name] is seen for follow-up.   He was not able to get  olmesartan.  He is back on losartan.  Blood pressure readings are fairly well controlled. ?Statin was recently stopped due to interaction with synthroid  ?Feeling well  ?No CP or dyspnea ?Has occasional CP when lying down.   Does not occur when walking  ? ?Oct. 23, 2019: ? ?Doing well.   BP  Is well controlled  Is back on Losartan .  ?Still losing weight ?Is getting XRT for his non hodgkins lymphoma  ?Also getting modified chemo - takes amlodipine 1.25 mg a day   ?Overall feels well ? ?Jan. 12, 2021 ? ?Julian [name] is seen today for follow up of his HTN ?Getting XRT for his non-Hodgkins lymphoma  ?Seen with his wife. ?Dr. Brigitte Pulse took him off the atorvastatin  ?LDL is 161.    Still eating bacon and sausage ?Still walking 2 miles every other day . ?Is very healthy.   ? ?March 03, 2020 ?Rapid seen today for follow-up of his hypertension.  He has a history of non-Hodgkin's lymphoma.  He also has a history of hyperlipidemia. ? ?Feeling well .  ?Has completed his chemo for non - Hodgkins lymphoma  ?Had an episode of loss of ability to talk clearly  ?No cp or dyspnea ?Has lost some weight  ? ?April 13,2022 ?Julian [name] is seen today for follow up of his HTN,  Seen with wife , [name]  ?Able to do most of his normal activities.  ?He is concerned about his weight loss.  Wife [name] gives him a very good diet  ?Walks 2.1 miles in 35 min.   [name] ?Has restarted and is Still doing chemo treatments  ? ?December 18, 2021 ?Julian [name] is seen today for follow up of his HTN ?Has Hodgkins lymphoma  ?Seen with wife, [name]  ?Turned 89 a few weeks ago  ?Walks regularly , ? ? ? ?Past Medical History:  ?Diagnosis Date  ? Anemia   ? Hyperlipidemia   ? Hypertension   ? Prostate cancer ([place])   ? ? ?Past Surgical History:  ?Procedure Laterality Date  ? CARPAL TUNNEL RELEASE Left   ? HERNIA REPAIR    ? MID 80'S  ? KNEE SURGERY Left   ? PROSTATECTOMY  2001  ? ? ? ?Current Outpatient Medications  ?Medication Sig Dispense Refill  ? amLODipine  (  NORVASC) 2.5 MG tablet Take 1.25 mg by mouth daily.    ? atorvastatin (LIPITOR) 40 MG tablet TAKE 1 TABLET BY MOUTH  DAILY 90 tablet 0  ? Cholecalciferol (VITAMIN D3) 125 MCG (5000 UT) CAPS Take 1 capsule by mouth daily.    ? Cyanocobalamin 5000 MCG CAPS Take by mouth. Take by mouth.    ? fluticasone (FLONASE) 50 MCG/ACT nasal spray Place 2 sprays into both nostrils as needed for allergies or rhinitis.    ? losartan (COZAAR) 100 MG tablet TAKE 1 TABLET BY MOUTH DAILY 90 tablet 3  ? Lutein-Zeaxanthin 25-5 MG CAPS Take 1 tablet by mouth daily.     ? Multiple Vitamin (MULTIVITAMIN) tablet Take 1 tablet by mouth daily. CENTRUM SILVER    ? zinc gluconate 50 MG tablet Take 50 mg by mouth daily.    ? Apoaequorin (PREVAGEN PO) Take 50 mcg by mouth daily. (Patient not taking: Reported on 12/18/2021)    ? ?No current facility-administered medications for this visit.  ? ? ?Allergies:   Patient has no known allergies.  ? ? ?Social History:  The patient  reports that he quit smoking about 40 years ago. His smoking use included cigarettes. He has never used smokeless tobacco. He reports current alcohol use. He reports that he does not use drugs.  ? ?Family History:  The patient's family history includes Alzheimer's disease in his brother; CAD in his father and mother; Dementia in his sister; Heart attack in his sister.  ? ? ?ROS:  Please see the history of present illness.  ? ? ?Physical Exam: ?Blood pressure 124/66, pulse 98, height '5\' 10"'$  (1.778 m), weight 143 lb 6.4 oz (65 kg), SpO2 98 %. ? ?GEN:  Well nourished, well developed in no acute distress ?HEENT: Normal ?NECK: No JVD; No carotid bruits ?LYMPHATICS: No lymphadenopathy ?CARDIAC: RRR, soft systolic murmur  ?RESPIRATORY:  Clear to auscultation without rales, wheezing or rhonchi  ?ABDOMEN: Soft, non-tender, non-distended ?MUSCULOSKELETAL:  No edema; No deformity  ?SKIN: Warm and dry ?NEUROLOGIC:  Alert and oriented x 3 ? ? ?EKG:   December 18, 2021: Normal sinus rhythm at  68.  First-degree AV block.  He has occasional premature ventricular contractions.  Right bundle branch block. ? ?Recent Labs: ?No results found for requested labs within last 8760 hours.  ? ? ?Lipid Panel

## 2021-12-18 ENCOUNTER — Encounter: Payer: Self-pay | Admitting: Cardiovascular Disease

## 2021-12-18 ENCOUNTER — Ambulatory Visit (INDEPENDENT_AMBULATORY_CARE_PROVIDER_SITE_OTHER): Payer: Medicare Other | Admitting: Cardiovascular Disease

## 2021-12-18 VITALS — BP 124/66 | HR 98 | Ht 70.0 in | Wt 143.4 lb

## 2021-12-18 DIAGNOSIS — I10 Essential (primary) hypertension: Secondary | ICD-10-CM

## 2021-12-18 MED ORDER — AMLODIPINE BESYLATE 2.5 MG PO TABS: 2.5000 mg | ORAL_TABLET | Freq: Every day | ORAL | 4 refills | Status: DC

## 2021-12-18 NOTE — Patient Instructions (Signed)
Medication Instructions:  ? ?TAKE AMLODIPINE 2.5 MG BY MOUTH DAILY ? ?*If you need a refill on your cardiac medications before your next appointment, please call your pharmacy* ? ? ? ?Follow-Up: ?At [facility], you and your health needs are our priority.  As part of our continuing mission to provide you with exceptional heart care, we have created designated Provider Care Teams.  These Care Teams include your primary Cardiologist (physician) and Advanced Practice Providers (APPs -  Physician Assistants and Nurse Practitioners) who all work together to provide you with the care you need, when you need it. ? ?We recommend signing up for the patient portal called "MyChart".  Sign up information is provided on this After Visit Summary.  MyChart is used to connect with patients for Virtual Visits (Telemedicine).  Patients are able to view lab/test results, encounter notes, upcoming appointments, etc.  Non-urgent messages can be sent to your provider as well.   ?To learn more about what you can do with MyChart, go to [email or web address].   ? ?Your next appointment:   ?1 year(s) ? ?The format for your next appointment:   ?In Person ? ?Provider:   ?[name], MD { ? ? ?Important Information About Sugar ? ? ? ? ? ? ?

## 2022-01-12 DIAGNOSIS — C8599 Non-Hodgkin lymphoma, unspecified, extranodal and solid organ sites: Secondary | ICD-10-CM | POA: Diagnosis not present

## 2022-02-25 DIAGNOSIS — Z789 Other specified health status: Secondary | ICD-10-CM | POA: Diagnosis not present

## 2022-02-25 DIAGNOSIS — F0280 Dementia in other diseases classified elsewhere without behavioral disturbance: Secondary | ICD-10-CM | POA: Diagnosis not present

## 2022-02-25 DIAGNOSIS — R29818 Other symptoms and signs involving the nervous system: Secondary | ICD-10-CM | POA: Diagnosis not present

## 2022-03-02 DIAGNOSIS — C8599 Non-Hodgkin lymphoma, unspecified, extranodal and solid organ sites: Secondary | ICD-10-CM | POA: Diagnosis not present

## 2022-03-25 DIAGNOSIS — C884 Extranodal marginal zone B-cell lymphoma of mucosa-associated lymphoid tissue [MALT-lymphoma]: Secondary | ICD-10-CM | POA: Diagnosis not present

## 2022-04-04 ENCOUNTER — Other Ambulatory Visit: Payer: Self-pay | Admitting: Cardiovascular Disease

## 2022-05-03 DIAGNOSIS — E039 Hypothyroidism, unspecified: Secondary | ICD-10-CM | POA: Diagnosis not present

## 2022-05-03 DIAGNOSIS — K529 Noninfective gastroenteritis and colitis, unspecified: Secondary | ICD-10-CM | POA: Diagnosis not present

## 2022-05-03 DIAGNOSIS — Z8546 Personal history of malignant neoplasm of prostate: Secondary | ICD-10-CM | POA: Diagnosis not present

## 2022-05-03 DIAGNOSIS — N182 Chronic kidney disease, stage 2 (mild): Secondary | ICD-10-CM | POA: Diagnosis not present

## 2022-05-03 DIAGNOSIS — C884 Extranodal marginal zone B-cell lymphoma of mucosa-associated lymphoid tissue [MALT-lymphoma]: Secondary | ICD-10-CM | POA: Diagnosis not present

## 2022-05-03 DIAGNOSIS — G300 Alzheimer's disease with early onset: Secondary | ICD-10-CM | POA: Diagnosis not present

## 2022-05-03 DIAGNOSIS — I129 Hypertensive chronic kidney disease with stage 1 through stage 4 chronic kidney disease, or unspecified chronic kidney disease: Secondary | ICD-10-CM | POA: Diagnosis not present

## 2022-05-03 DIAGNOSIS — M810 Age-related osteoporosis without current pathological fracture: Secondary | ICD-10-CM | POA: Diagnosis not present

## 2022-05-03 DIAGNOSIS — C8599 Non-Hodgkin lymphoma, unspecified, extranodal and solid organ sites: Secondary | ICD-10-CM | POA: Diagnosis not present

## 2022-05-03 DIAGNOSIS — F0280 Dementia in other diseases classified elsewhere without behavioral disturbance: Secondary | ICD-10-CM | POA: Diagnosis not present

## 2022-05-03 DIAGNOSIS — E785 Hyperlipidemia, unspecified: Secondary | ICD-10-CM | POA: Diagnosis not present

## 2022-06-12 DIAGNOSIS — Z23 Encounter for immunization: Secondary | ICD-10-CM | POA: Diagnosis not present

## 2022-06-29 DIAGNOSIS — D485 Neoplasm of uncertain behavior of skin: Secondary | ICD-10-CM | POA: Diagnosis not present

## 2022-06-29 DIAGNOSIS — L821 Other seborrheic keratosis: Secondary | ICD-10-CM | POA: Diagnosis not present

## 2022-06-29 DIAGNOSIS — C44622 Squamous cell carcinoma of skin of right upper limb, including shoulder: Secondary | ICD-10-CM | POA: Diagnosis not present

## 2022-06-29 DIAGNOSIS — C44612 Basal cell carcinoma of skin of right upper limb, including shoulder: Secondary | ICD-10-CM | POA: Diagnosis not present

## 2022-06-29 DIAGNOSIS — Z08 Encounter for follow-up examination after completed treatment for malignant neoplasm: Secondary | ICD-10-CM | POA: Diagnosis not present

## 2022-06-29 DIAGNOSIS — L814 Other melanin hyperpigmentation: Secondary | ICD-10-CM | POA: Diagnosis not present

## 2022-06-29 DIAGNOSIS — Z85828 Personal history of other malignant neoplasm of skin: Secondary | ICD-10-CM | POA: Diagnosis not present

## 2022-06-29 DIAGNOSIS — D1801 Hemangioma of skin and subcutaneous tissue: Secondary | ICD-10-CM | POA: Diagnosis not present

## 2022-06-29 DIAGNOSIS — L570 Actinic keratosis: Secondary | ICD-10-CM | POA: Diagnosis not present

## 2022-07-20 DIAGNOSIS — C8599 Non-Hodgkin lymphoma, unspecified, extranodal and solid organ sites: Secondary | ICD-10-CM | POA: Diagnosis not present

## 2022-07-27 DIAGNOSIS — C44612 Basal cell carcinoma of skin of right upper limb, including shoulder: Secondary | ICD-10-CM | POA: Diagnosis not present

## 2022-07-27 DIAGNOSIS — L728 Other follicular cysts of the skin and subcutaneous tissue: Secondary | ICD-10-CM | POA: Diagnosis not present

## 2022-07-27 DIAGNOSIS — H6121 Impacted cerumen, right ear: Secondary | ICD-10-CM | POA: Diagnosis not present

## 2022-08-25 DIAGNOSIS — L905 Scar conditions and fibrosis of skin: Secondary | ICD-10-CM | POA: Diagnosis not present

## 2022-08-25 DIAGNOSIS — C44622 Squamous cell carcinoma of skin of right upper limb, including shoulder: Secondary | ICD-10-CM | POA: Diagnosis not present

## 2022-09-01 DIAGNOSIS — G300 Alzheimer's disease with early onset: Secondary | ICD-10-CM | POA: Diagnosis not present

## 2022-09-01 DIAGNOSIS — F0280 Dementia in other diseases classified elsewhere without behavioral disturbance: Secondary | ICD-10-CM | POA: Diagnosis not present

## 2022-10-01 DIAGNOSIS — Z974 Presence of external hearing-aid: Secondary | ICD-10-CM | POA: Diagnosis not present

## 2022-10-01 DIAGNOSIS — H9201 Otalgia, right ear: Secondary | ICD-10-CM | POA: Diagnosis not present

## 2022-10-01 DIAGNOSIS — H6121 Impacted cerumen, right ear: Secondary | ICD-10-CM | POA: Diagnosis not present

## 2022-10-21 DIAGNOSIS — R399 Unspecified symptoms and signs involving the genitourinary system: Secondary | ICD-10-CM | POA: Diagnosis not present

## 2022-10-25 ENCOUNTER — Ambulatory Visit: Payer: Medicare Other | Admitting: Physician Assistant

## 2022-10-25 ENCOUNTER — Ambulatory Visit: Payer: Medicare Other

## 2022-10-29 ENCOUNTER — Encounter: Payer: Self-pay | Admitting: Physician Assistant

## 2022-10-29 ENCOUNTER — Ambulatory Visit (INDEPENDENT_AMBULATORY_CARE_PROVIDER_SITE_OTHER): Payer: Medicare Other | Admitting: Physician Assistant

## 2022-10-29 ENCOUNTER — Other Ambulatory Visit (INDEPENDENT_AMBULATORY_CARE_PROVIDER_SITE_OTHER): Payer: Medicare Other

## 2022-10-29 VITALS — HR 82 | Resp 18 | Ht 70.0 in | Wt 151.0 lb

## 2022-10-29 DIAGNOSIS — R413 Other amnesia: Secondary | ICD-10-CM

## 2022-10-29 LAB — VITAMIN B12: Vitamin B-12: 341 pg/mL (ref 211–911)

## 2022-10-29 LAB — TSH: TSH: 5.07 u[IU]/mL (ref 0.35–5.50)

## 2022-10-29 NOTE — Patient Instructions (Addendum)
Julian [name] it was a pleasure to see you today at our office.   Recommendations:  MRI of the brain, the radiology office will call you to arrange you appointment Check labs today Follow up in May 2024   For assessment of decision of mental capacity and competency:  Call Dr. Anthoney Harada, geriatric psychiatrist at [phone]  For psychiatric meds, mood meds: Please have your primary care physician manage these medications.  Agree with memantine dosepack      RECOMMENDATIONS FOR ALL PATIENTS WITH MEMORY PROBLEMS: 1. Continue to exercise (Recommend 30 minutes of walking everyday, or 3 hours every week) 2. Increase social interactions - continue going to [place] and enjoy social gatherings with friends and family 3. Eat healthy, avoid fried foods and eat more fruits and vegetables 4. Maintain adequate blood pressure, blood sugar, and blood cholesterol level. Reducing the risk of stroke and cardiovascular disease also helps promoting better memory. 5. Avoid stressful situations. Live a simple life and avoid aggravations. Organize your time and prepare for the next day in anticipation. 6. Sleep well, avoid any interruptions of sleep and avoid any distractions in the bedroom that may interfere with adequate sleep quality 7. Avoid sugar, avoid sweets as there is a strong link between excessive sugar intake, diabetes, and cognitive impairment We discussed the Mediterranean diet, which has been shown to help patients reduce the risk of progressive memory disorders and reduces cardiovascular risk. This includes eating fish, eat fruits and green leafy vegetables, nuts like almonds and hazelnuts, walnuts, and also use olive oil. Avoid fast foods and fried foods as much as possible. Avoid sweets and sugar as sugar use has been linked to worsening of memory function.  There is always a concern of gradual progression of memory problems. If this is the case, then we may need to adjust level of care according to  patient needs. Support, both to the patient and caregiver, should then be put into place.     FALL PRECAUTIONS: Be cautious when walking. Scan the area for obstacles that may increase the risk of trips and falls. When getting up in the mornings, sit up at the edge of the bed for a few minutes before getting out of bed. Consider elevating the bed at the head end to avoid drop of blood pressure when getting up. Walk always in a well-lit room (use night lights in the walls). Avoid area rugs or power cords from appliances in the middle of the walkways. Use a walker or a cane if necessary and consider physical therapy for balance exercise. Get your eyesight checked regularly.  FINANCIAL OVERSIGHT: Supervision, especially oversight when making financial decisions or transactions is also recommended.  HOME SAFETY: Consider the safety of the kitchen when operating appliances like stoves, microwave oven, and blender. Consider having supervision and share cooking responsibilities until no longer able to participate in those. Accidents with firearms and other hazards in the house should be identified and addressed as well.   ABILITY TO BE LEFT ALONE: If patient is unable to contact 911 operator, consider using LifeLine, or when the need is there, arrange for someone to stay with patients. Smoking is a fire hazard, consider supervision or cessation. Risk of wandering should be assessed by caregiver and if detected at any point, supervision and safe proof recommendations should be instituted.  MEDICATION SUPERVISION: Inability to self-administer medication needs to be constantly addressed. Implement a mechanism to ensure safe administration of the medications.   DRIVING: Regarding driving, in patients  with progressive memory problems, driving will be impaired. We advise to have someone else do the driving if trouble finding directions or if minor accidents are reported. Independent driving assessment is available  to determine safety of driving.    Mediterranean Diet A Mediterranean diet refers to food and lifestyle choices that are based on the traditions of countries located on the [employer]. This way of eating has been shown to help prevent certain conditions and improve outcomes for people who have chronic diseases, like kidney disease and heart disease. What are tips for following this plan? Lifestyle  Cook and eat meals together with your family, when possible. Drink enough fluid to keep your urine clear or pale yellow. Be physically active every day. This includes: Aerobic exercise like running or swimming. Leisure activities like gardening, walking, or housework. Get 7-8 hours of sleep each night. If recommended by your health care provider, drink red wine in moderation. This means 1 glass a day for nonpregnant women and 2 glasses a day for men. A glass of wine equals 5 oz (150 mL). Reading food labels  Check the serving size of packaged foods. For foods such as rice and pasta, the serving size refers to the amount of cooked product, not dry. Check the total fat in packaged foods. Avoid foods that have saturated fat or trans fats. Check the ingredients list for added sugars, such as corn syrup. Shopping  At the grocery store, buy most of your food from the areas near the walls of the store. This includes: Fresh fruits and vegetables (produce). Grains, beans, nuts, and seeds. Some of these may be available in unpackaged forms or large amounts (in bulk). Fresh seafood. Poultry and eggs. Low-fat dairy products. Buy whole ingredients instead of prepackaged foods. Buy fresh fruits and vegetables in-season from local farmers markets. Buy frozen fruits and vegetables in resealable bags. If you do not have access to quality fresh seafood, buy precooked frozen shrimp or canned fish, such as tuna, salmon, or sardines. Buy small amounts of raw or cooked vegetables, salads, or olives from the  deli or salad bar at your store. Stock your pantry so you always have certain foods on hand, such as olive oil, canned tuna, canned tomatoes, rice, pasta, and beans. Cooking  Cook foods with extra-virgin olive oil instead of using butter or other vegetable oils. Have meat as a side dish, and have vegetables or grains as your main dish. This means having meat in small portions or adding small amounts of meat to foods like pasta or stew. Use beans or vegetables instead of meat in common dishes like chili or lasagna. Experiment with different cooking methods. Try roasting or broiling vegetables instead of steaming or sauteing them. Add frozen vegetables to soups, stews, pasta, or rice. Add nuts or seeds for added healthy fat at each meal. You can add these to yogurt, salads, or vegetable dishes. Marinate fish or vegetables using olive oil, lemon juice, garlic, and fresh herbs. Meal planning  Plan to eat 1 vegetarian meal one day each week. Try to work up to 2 vegetarian meals, if possible. Eat seafood 2 or more times a week. Have healthy snacks readily available, such as: Vegetable sticks with hummus. Greek yogurt. Fruit and nut trail mix. Eat balanced meals throughout the week. This includes: Fruit: 2-3 servings a day Vegetables: 4-5 servings a day Low-fat dairy: 2 servings a day Fish, poultry, or lean meat: 1 serving a day Beans and legumes: 2 or more  servings a week Nuts and seeds: 1-2 servings a day Whole grains: 6-8 servings a day Extra-virgin olive oil: 3-4 servings a day Limit red meat and sweets to only a few servings a month What are my food choices? Mediterranean diet Recommended Grains: Whole-grain pasta. Brown rice. Bulgar wheat. Polenta. Couscous. Whole-wheat bread. [place]. Vegetables: Artichokes. Beets. Broccoli. Cabbage. Carrots. Eggplant. Green beans. Chard. Kale. Spinach. Onions. Leeks. Peas. Squash. Tomatoes. Peppers. Radishes. Fruits: Apples. Apricots.  Avocado. Berries. Bananas. Cherries. Dates. Figs. Grapes. Lemons. Melon. Oranges. Peaches. Plums. Pomegranate. Meats and other protein foods: Beans. Almonds. Sunflower seeds. Pine nuts. Peanuts. [place]. Salmon. Scallops. Shrimp. [place]. Tilapia. Clams. Oysters. Eggs. Dairy: Low-fat milk. Cheese. Greek yogurt. Beverages: Water. Red wine. Herbal tea. Fats and oils: Extra virgin olive oil. Avocado oil. Grape seed oil. Sweets and desserts: [place] yogurt with honey. Baked apples. Poached pears. Trail mix. Seasoning and other foods: Basil. Cilantro. Coriander. Cumin. Mint. Parsley. Sage. Rosemary. Tarragon. Garlic. Oregano. Thyme. Pepper. Balsalmic vinegar. Tahini. Hummus. Tomato sauce. Olives. Mushrooms. Limit these Grains: Prepackaged pasta or rice dishes. Prepackaged cereal with added sugar. Vegetables: Deep fried potatoes (french fries). Fruits: Fruit canned in syrup. Meats and other protein foods: Beef. Pork. Lamb. Poultry with skin. Hot dogs. [place] Salines. Dairy: Ice cream. Sour cream. Whole milk. Beverages: Juice. Sugar-sweetened soft drinks. Beer. Liquor and spirits. Fats and oils: Butter. Canola oil. Vegetable oil. Beef fat (tallow). Lard. Sweets and desserts: Cookies. Cakes. Pies. Candy. Seasoning and other foods: Mayonnaise. Premade sauces and marinades. The items listed may not be a complete list. Talk with your dietitian about what dietary choices are right for you. Summary The Mediterranean diet includes both food and lifestyle choices. Eat a variety of fresh fruits and vegetables, beans, nuts, seeds, and whole grains. Limit the amount of red meat and sweets that you eat. Talk with your health care provider about whether it is safe for you to drink red wine in moderation. This means 1 glass a day for nonpregnant women and 2 glasses a day for men. A glass of wine equals 5 oz (150 mL). This information is not intended to replace advice given to you by your health care provider. Make sure you discuss  any questions you have with your health care provider. Document Released: 04/01/2016 Document Revised: 05/04/2016 Document Reviewed: 04/01/2016 Elsevier Interactive Patient Education  2017 [employer].

## 2022-10-29 NOTE — Progress Notes (Signed)
b12 on the lower side Would recommend  1000 mcg daily follow with PCP, thanks

## 2022-10-29 NOTE — Progress Notes (Signed)
Assessment/Plan:    The patient is seen in neurologic consultation at the request of Julian [name]., MD for the evaluation of memory.  Julian [name] is a very pleasant 89 y.o. year old [place] male retired Horticulturist, commercial, and Insurance underwriter, with  a history of hypertension, hyperlipidemia, TIA 2019, history of anemia, history of prostate cancer, aortic sclerosis, RBBB, NHL seen today for evaluation of memory loss. MoCA today is 14/30.  He is PCP had prescribed memantine Dosepak but that has not been started yet.  He is independent with some ADLs but he is no longer driving.  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 Discussed with his wife addressing his behavioral disturbance, night terrors, perhaps could be controlled with medications, however since he is to start [place], would recommend starting one medication and then another, she would like to start first memantine and then entertain behavioral meds. Check B12, TSH, B1 MRI of the brain to rule out any structural abnormalities and vascular load. Folllow up in about 1 to 2 months to discuss the results of the MRI Recommend using hearing aids to attempt to improve comprehension.  Subjective:    The patient is accompanied by his wife who supplements the history.    How long did patient have memory difficulties?  Patient's wife reports that about 1 year ago his MD noticed memory difficulties, felt to be due to medications "came suddenly ", "tried Aricept and did not work, now on Surveyor, mining , good diet and hydration.  Now, he has some very vivid dreams, and increased anxiety, and he noticed that he is having difficulty remembering recent conversations and people's names.    Disoriented when walking into a room?  He does  not think he lived in his own house , "2 weeks ago he tried to get the car to go to the prior house " Leaving objects in unusual places?  Denies  Wandering behavior? Endorsed.  Any personality changes since last visit?  "He gets upset that he has these nightmares because he believes that they are real " Any worsening depression?:  denies   Hallucinations or paranoia? Endorsed,  for the last year at night t, he thinks that they are people in the room, he wants to defend his wife from these people.  Seizures?   denies    Any sleep changes?  Denies  vivid dreams, + REM behavior or sleepwalking   Sleep apnea? denies   Any hygiene concerns?  "He is immaculate " Independent of bathing and dressing?  Endorsed  Does the patient need help with medications? Wife  is in charge Who is in charge of the finances?  Wife  is in charge Any changes in appetite? Eats well, he has a sophisticated palate, loves [place] and [place] food, fruits and vegetables    Patient have trouble swallowing?  denies   Does the patient cook? No   Any headaches?  denies   Chronic back pain  denies   Ambulates with difficulty?  Slower than before but walks frequently  Recent falls or head injuries?  denies     Unilateral weakness, numbness or tingling?  denies   Any tremors?  Denies Any anosmia?  denies   Any incontinence of urine?  denies   Any bowel dysfunction?  denies      Patient lives with his wife History of heavy alcohol intake?  He drinks 2 drinks a day (wine w meal in the evening or beer w chinese food)  History of heavy tobacco use?   denies   Family history of dementia?    brother and sister both died with Alzheimer's disease. Dose patient drive?  "No, because I failed the test and my wife does not let me drive ". 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  No Known Allergies  Current Outpatient Medications   Medication Instructions   amLODipine (NORVASC) 2.5 mg, Oral, Daily   Apoaequorin (PREVAGEN PO) 50 mcg, Daily   atorvastatin (LIPITOR) 40 MG tablet TAKE 1 TABLET BY MOUTH DAILY   Cholecalciferol (VITAMIN D3) 125 MCG (5000 UT) CAPS 1 capsule, Oral, Daily   Cyanocobalamin 5000 MCG CAPS Oral, Take by mouth.   fluticasone (FLONASE) 50 MCG/ACT nasal spray 2 sprays, Each Nare, As needed   losartan (COZAAR) 100 MG tablet TAKE 1 TABLET BY MOUTH DAILY   Lutein-Zeaxanthin 25-5 MG CAPS 1 tablet, Oral, Daily   Multiple Vitamin (MULTIVITAMIN) tablet 1 tablet, Oral, Daily, CENTRUM SILVER    zinc gluconate 50 mg, Oral, Daily     VITALS:   Vitals:   10/29/22 0747  Pulse: 82  Resp: 18  SpO2: 97%  Weight: 151 lb (68.5 kg)  Height: '5\' 10"'$  (1.778 m)       No data to display          PHYSICAL EXAM   HEENT:  Normocephalic, atraumatic. The mucous membranes are moist. The superficial temporal arteries are without ropiness or tenderness. Cardiovascular: Regular rate and rhythm. Lungs: Clear to auscultation bilaterally. Neck: There are no carotid bruits noted bilaterally.  NEUROLOGICAL:    10/29/2022   12:00 PM  Montreal Cognitive Assessment   Visuospatial/ Executive (0/5) 4  Naming (0/3) 3  Attention: Read list of digits (0/2) 1  Attention: Read list of letters (0/1) 1  Attention: Serial 7 subtraction starting at 100 (0/3) 1  Language: Repeat phrase (0/2) 1  Language : Fluency (0/1) 1  Abstraction (0/2) 1  Delayed Recall (0/5) 0  Orientation (0/6) 1  Total 14  Adjusted Score (based on education) 14        No data to display           Orientation:  Alert and oriented to person, not to place and time. No aphasia or dysarthria. Fund of knowledge is reduced. Recent and remote memory impaired.  Attention and concentration are reduced.  Able to name objects and repeat phrases 1/2. Delayed recall 0/5 Cranial nerves: There is good facial symmetry. Extraocular muscles are intact and  visual fields are full to confrontational testing. Speech is fluent and clear but very slow. no tongue deviation. Hearing is decreased to conversational tone.  Tone: Tone is good throughout. Sensation: Sensation is intact to light touch and pinprick throughout. Vibration is intact at the bilateral big toe.There is no extinction with double simultaneous stimulation. There is no sensory dermatomal level identified. Coordination: The patient has no difficulty with RAM's or FNF bilaterally. Normal finger to nose  Motor: Strength is 5/5 in the bilateral upper and lower extremities. There is no pronator drift. There are no fasciculations noted. DTR's: Deep tendon reflexes are 2/4 at the bilateral biceps, triceps, brachioradialis, patella and achilles.  Plantar responses are downgoing bilaterally. Gait and Station:  The patient is able to ambulate without difficulty.The patient is able to ambulate in a tandem fashion, able to stand in the Romberg position.     Thank you for allowing [place] the opportunity to participate in the care of this nice patient. Please do not hesitate to contact us for any questions or concerns.   Total time spent on today's visit was 66 minutes dedicated to this patient today, preparing to see patient, examining the patient, ordering tests and/or medications and counseling the patient, documenting clinical information in the EHR or other health record, independently interpreting results and communicating results to the patient/family, discussing treatment and goals, answering patient's questions and coordinating care.  Cc:  Julian [name]., MD  Sharene Butters 10/29/2022 12:18 PM

## 2022-11-02 LAB — VITAMIN B1: Vitamin B1 (Thiamine): 11 nmol/L (ref 8–30)

## 2022-11-02 NOTE — Progress Notes (Signed)
B1 on the lower normal., recommend 100 mg daily  and follow with primary doctor, thanks

## 2022-11-05 ENCOUNTER — Ambulatory Visit
Admission: RE | Admit: 2022-11-05 | Discharge: 2022-11-05 | Disposition: A | Discharge status: Home / Self Care | Payer: Medicare Other | Source: Ambulatory Visit | Attending: Physician Assistant | Admitting: Physician Assistant

## 2022-11-05 DIAGNOSIS — R413 Other amnesia: Secondary | ICD-10-CM | POA: Diagnosis not present

## 2022-11-05 DIAGNOSIS — Z8572 Personal history of non-Hodgkin lymphomas: Secondary | ICD-10-CM | POA: Diagnosis not present

## 2022-11-05 DIAGNOSIS — Z8546 Personal history of malignant neoplasm of prostate: Secondary | ICD-10-CM | POA: Diagnosis not present

## 2022-11-05 DIAGNOSIS — G319 Degenerative disease of nervous system, unspecified: Secondary | ICD-10-CM | POA: Diagnosis not present

## 2022-11-12 ENCOUNTER — Telehealth: Payer: Self-pay | Admitting: Anesthesiology

## 2022-11-12 NOTE — Telephone Encounter (Signed)
Pt left message with AN requesting MRI results.

## 2022-11-15 NOTE — Telephone Encounter (Signed)
Patient advised.

## 2022-11-15 NOTE — Telephone Encounter (Signed)
No answer at 11/15/2022 at 10:40am

## 2022-11-25 ENCOUNTER — Other Ambulatory Visit: Payer: Self-pay | Admitting: Cardiovascular Disease

## 2022-11-25 DIAGNOSIS — I10 Essential (primary) hypertension: Secondary | ICD-10-CM

## 2022-12-06 DIAGNOSIS — R7989 Other specified abnormal findings of blood chemistry: Secondary | ICD-10-CM | POA: Diagnosis not present

## 2022-12-06 DIAGNOSIS — I10 Essential (primary) hypertension: Secondary | ICD-10-CM | POA: Diagnosis not present

## 2022-12-06 DIAGNOSIS — Z125 Encounter for screening for malignant neoplasm of prostate: Secondary | ICD-10-CM | POA: Diagnosis not present

## 2022-12-06 DIAGNOSIS — M810 Age-related osteoporosis without current pathological fracture: Secondary | ICD-10-CM | POA: Diagnosis not present

## 2022-12-06 DIAGNOSIS — E039 Hypothyroidism, unspecified: Secondary | ICD-10-CM | POA: Diagnosis not present

## 2022-12-06 DIAGNOSIS — E785 Hyperlipidemia, unspecified: Secondary | ICD-10-CM | POA: Diagnosis not present

## 2022-12-13 DIAGNOSIS — M810 Age-related osteoporosis without current pathological fracture: Secondary | ICD-10-CM | POA: Diagnosis not present

## 2022-12-13 DIAGNOSIS — Z1331 Encounter for screening for depression: Secondary | ICD-10-CM | POA: Diagnosis not present

## 2022-12-13 DIAGNOSIS — E785 Hyperlipidemia, unspecified: Secondary | ICD-10-CM | POA: Diagnosis not present

## 2022-12-13 DIAGNOSIS — E039 Hypothyroidism, unspecified: Secondary | ICD-10-CM | POA: Diagnosis not present

## 2022-12-13 DIAGNOSIS — Z8546 Personal history of malignant neoplasm of prostate: Secondary | ICD-10-CM | POA: Diagnosis not present

## 2022-12-13 DIAGNOSIS — G300 Alzheimer's disease with early onset: Secondary | ICD-10-CM | POA: Diagnosis not present

## 2022-12-13 DIAGNOSIS — C8599 Non-Hodgkin lymphoma, unspecified, extranodal and solid organ sites: Secondary | ICD-10-CM | POA: Diagnosis not present

## 2022-12-13 DIAGNOSIS — F02B0 Dementia in other diseases classified elsewhere, moderate, without behavioral disturbance, psychotic disturbance, mood disturbance, and anxiety: Secondary | ICD-10-CM | POA: Diagnosis not present

## 2022-12-13 DIAGNOSIS — I10 Essential (primary) hypertension: Secondary | ICD-10-CM | POA: Diagnosis not present

## 2022-12-13 DIAGNOSIS — C884 Extranodal marginal zone B-cell lymphoma of mucosa-associated lymphoid tissue [MALT-lymphoma]: Secondary | ICD-10-CM | POA: Diagnosis not present

## 2022-12-13 DIAGNOSIS — Z1339 Encounter for screening examination for other mental health and behavioral disorders: Secondary | ICD-10-CM | POA: Diagnosis not present

## 2022-12-13 DIAGNOSIS — R82998 Other abnormal findings in urine: Secondary | ICD-10-CM | POA: Diagnosis not present

## 2022-12-13 DIAGNOSIS — N182 Chronic kidney disease, stage 2 (mild): Secondary | ICD-10-CM | POA: Diagnosis not present

## 2022-12-13 DIAGNOSIS — K529 Noninfective gastroenteritis and colitis, unspecified: Secondary | ICD-10-CM | POA: Diagnosis not present

## 2022-12-13 DIAGNOSIS — Z0000 Encounter for general adult medical examination without abnormal findings: Secondary | ICD-10-CM | POA: Diagnosis not present

## 2022-12-13 DIAGNOSIS — I129 Hypertensive chronic kidney disease with stage 1 through stage 4 chronic kidney disease, or unspecified chronic kidney disease: Secondary | ICD-10-CM | POA: Diagnosis not present

## 2022-12-13 DIAGNOSIS — G319 Degenerative disease of nervous system, unspecified: Secondary | ICD-10-CM | POA: Diagnosis not present

## 2022-12-15 ENCOUNTER — Other Ambulatory Visit: Payer: Self-pay | Admitting: Cardiovascular Disease

## 2022-12-17 ENCOUNTER — Other Ambulatory Visit: Payer: Self-pay | Admitting: Cardiovascular Disease

## 2022-12-17 DIAGNOSIS — I10 Essential (primary) hypertension: Secondary | ICD-10-CM

## 2022-12-29 ENCOUNTER — Ambulatory Visit (INDEPENDENT_AMBULATORY_CARE_PROVIDER_SITE_OTHER): Payer: Medicare Other | Admitting: Physician Assistant

## 2022-12-29 ENCOUNTER — Encounter: Payer: Self-pay | Admitting: Physician Assistant

## 2022-12-29 VITALS — BP 110/86 | HR 60 | Ht 70.0 in

## 2022-12-29 DIAGNOSIS — R413 Other amnesia: Secondary | ICD-10-CM

## 2022-12-29 NOTE — Patient Instructions (Signed)
Julian [name] it was a pleasure to see you today at our office.   Recommendations:  Continue B1 and B12 Recommend good control of cardiovascular risk factors.  Follow with Cardiology Follow up as needed Recommend using the hearing aids    For assessment of decision of mental capacity and competency:  Call Dr. Erick Blinks, geriatric psychiatrist at [phone]  For psychiatric meds, mood meds: Please have your primary care physician manage these medications.  Agree with memantine dosepack      RECOMMENDATIONS FOR ALL PATIENTS WITH MEMORY PROBLEMS: 1. Continue to exercise (Recommend 30 minutes of walking everyday, or 3 hours every week) 2. Increase social interactions - continue going to [place] and enjoy social gatherings with friends and family 3. Eat healthy, avoid fried foods and eat more fruits and vegetables 4. Maintain adequate blood pressure, blood sugar, and blood cholesterol level. Reducing the risk of stroke and cardiovascular disease also helps promoting better memory. 5. Avoid stressful situations. Live a simple life and avoid aggravations. Organize your time and prepare for the next day in anticipation. 6. Sleep well, avoid any interruptions of sleep and avoid any distractions in the bedroom that may interfere with adequate sleep quality 7. Avoid sugar, avoid sweets as there is a strong link between excessive sugar intake, diabetes, and cognitive impairment We discussed the Mediterranean diet, which has been shown to help patients reduce the risk of progressive memory disorders and reduces cardiovascular risk. This includes eating fish, eat fruits and green leafy vegetables, nuts like almonds and hazelnuts, walnuts, and also use olive oil. Avoid fast foods and fried foods as much as possible. Avoid sweets and sugar as sugar use has been linked to worsening of memory function.  There is always a concern of gradual progression of memory problems. If this is the case, then we may need  to adjust level of care according to patient needs. Support, both to the patient and caregiver, should then be put into place.     FALL PRECAUTIONS: Be cautious when walking. Scan the area for obstacles that may increase the risk of trips and falls. When getting up in the mornings, sit up at the edge of the bed for a few minutes before getting out of bed. Consider elevating the bed at the head end to avoid drop of blood pressure when getting up. Walk always in a well-lit room (use night lights in the walls). Avoid area rugs or power cords from appliances in the middle of the walkways. Use a walker or a cane if necessary and consider physical therapy for balance exercise. Get your eyesight checked regularly.  FINANCIAL OVERSIGHT: Supervision, especially oversight when making financial decisions or transactions is also recommended.  HOME SAFETY: Consider the safety of the kitchen when operating appliances like stoves, microwave oven, and blender. Consider having supervision and share cooking responsibilities until no longer able to participate in those. Accidents with firearms and other hazards in the house should be identified and addressed as well.   ABILITY TO BE LEFT ALONE: If patient is unable to contact 911 operator, consider using LifeLine, or when the need is there, arrange for someone to stay with patients. Smoking is a fire hazard, consider supervision or cessation. Risk of wandering should be assessed by caregiver and if detected at any point, supervision and safe proof recommendations should be instituted.  MEDICATION SUPERVISION: Inability to self-administer medication needs to be constantly addressed. Implement a mechanism to ensure safe administration of the medications.   DRIVING: Regarding  driving, in patients with progressive memory problems, driving will be impaired. We advise to have someone else do the driving if trouble finding directions or if minor accidents are reported.  Independent driving assessment is available to determine safety of driving.    Mediterranean Diet A Mediterranean diet refers to food and lifestyle choices that are based on the traditions of countries located on the [employer]. This way of eating has been shown to help prevent certain conditions and improve outcomes for people who have chronic diseases, like kidney disease and heart disease. What are tips for following this plan? Lifestyle  Cook and eat meals together with your family, when possible. Drink enough fluid to keep your urine clear or pale yellow. Be physically active every day. This includes: Aerobic exercise like running or swimming. Leisure activities like gardening, walking, or housework. Get 7-8 hours of sleep each night. If recommended by your health care provider, drink red wine in moderation. This means 1 glass a day for nonpregnant women and 2 glasses a day for men. A glass of wine equals 5 oz (150 mL). Reading food labels  Check the serving size of packaged foods. For foods such as rice and pasta, the serving size refers to the amount of cooked product, not dry. Check the total fat in packaged foods. Avoid foods that have saturated fat or trans fats. Check the ingredients list for added sugars, such as corn syrup. Shopping  At the grocery store, buy most of your food from the areas near the walls of the store. This includes: Fresh fruits and vegetables (produce). Grains, beans, nuts, and seeds. Some of these may be available in unpackaged forms or large amounts (in bulk). Fresh seafood. Poultry and eggs. Low-fat dairy products. Buy whole ingredients instead of prepackaged foods. Buy fresh fruits and vegetables in-season from local farmers markets. Buy frozen fruits and vegetables in resealable bags. If you do not have access to quality fresh seafood, buy precooked frozen shrimp or canned fish, such as tuna, salmon, or sardines. Buy small amounts of raw or  cooked vegetables, salads, or olives from the deli or salad bar at your store. Stock your pantry so you always have certain foods on hand, such as olive oil, canned tuna, canned tomatoes, rice, pasta, and beans. Cooking  Cook foods with extra-virgin olive oil instead of using butter or other vegetable oils. Have meat as a side dish, and have vegetables or grains as your main dish. This means having meat in small portions or adding small amounts of meat to foods like pasta or stew. Use beans or vegetables instead of meat in common dishes like chili or lasagna. Experiment with different cooking methods. Try roasting or broiling vegetables instead of steaming or sauteing them. Add frozen vegetables to soups, stews, pasta, or rice. Add nuts or seeds for added healthy fat at each meal. You can add these to yogurt, salads, or vegetable dishes. Marinate fish or vegetables using olive oil, lemon juice, garlic, and fresh herbs. Meal planning  Plan to eat 1 vegetarian meal one day each week. Try to work up to 2 vegetarian meals, if possible. Eat seafood 2 or more times a week. Have healthy snacks readily available, such as: Vegetable sticks with hummus. Greek yogurt. Fruit and nut trail mix. Eat balanced meals throughout the week. This includes: Fruit: 2-3 servings a day Vegetables: 4-5 servings a day Low-fat dairy: 2 servings a day Fish, poultry, or lean meat: 1 serving a day Beans and legumes:  2 or more servings a week Nuts and seeds: 1-2 servings a day Whole grains: 6-8 servings a day Extra-virgin olive oil: 3-4 servings a day Limit red meat and sweets to only a few servings a month What are my food choices? Mediterranean diet Recommended Grains: Whole-grain pasta. Brown rice. Bulgar wheat. Polenta. Couscous. Whole-wheat bread. [place]. Vegetables: Artichokes. Beets. Broccoli. Cabbage. Carrots. Eggplant. Green beans. Chard. Kale. Spinach. Onions. Leeks. Peas. Squash. Tomatoes.  Peppers. Radishes. Fruits: Apples. Apricots. Avocado. Berries. Bananas. Cherries. Dates. Figs. Grapes. Lemons. Melon. Oranges. Peaches. Plums. Pomegranate. Meats and other protein foods: Beans. Almonds. Sunflower seeds. Pine nuts. Peanuts. [place]. Salmon. Scallops. Shrimp. [place]. Tilapia. Clams. Oysters. Eggs. Dairy: Low-fat milk. Cheese. Greek yogurt. Beverages: Water. Red wine. Herbal tea. Fats and oils: Extra virgin olive oil. Avocado oil. Grape seed oil. Sweets and desserts: [place] yogurt with honey. Baked apples. Poached pears. Trail mix. Seasoning and other foods: Basil. Cilantro. Coriander. Cumin. Mint. Parsley. Sage. Rosemary. Tarragon. Garlic. Oregano. Thyme. Pepper. Balsalmic vinegar. Tahini. Hummus. Tomato sauce. Olives. Mushrooms. Limit these Grains: Prepackaged pasta or rice dishes. Prepackaged cereal with added sugar. Vegetables: Deep fried potatoes (french fries). Fruits: Fruit canned in syrup. Meats and other protein foods: Beef. Pork. Lamb. Poultry with skin. Hot dogs. [place] Salines. Dairy: Ice cream. Sour cream. Whole milk. Beverages: Juice. Sugar-sweetened soft drinks. Beer. Liquor and spirits. Fats and oils: Butter. Canola oil. Vegetable oil. Beef fat (tallow). Lard. Sweets and desserts: Cookies. Cakes. Pies. Candy. Seasoning and other foods: Mayonnaise. Premade sauces and marinades. The items listed may not be a complete list. Talk with your dietitian about what dietary choices are right for you. Summary The Mediterranean diet includes both food and lifestyle choices. Eat a variety of fresh fruits and vegetables, beans, nuts, seeds, and whole grains. Limit the amount of red meat and sweets that you eat. Talk with your health care provider about whether it is safe for you to drink red wine in moderation. This means 1 glass a day for nonpregnant women and 2 glasses a day for men. A glass of wine equals 5 oz (150 mL). This information is not intended to replace advice given to you by  your health care provider. Make sure you discuss any questions you have with your health care provider. Document Released: 04/01/2016 Document Revised: 05/04/2016 Document Reviewed: 04/01/2016 Elsevier Interactive Patient Education  2017 [employer].

## 2022-12-29 NOTE — Progress Notes (Signed)
Assessment/Plan:   Memory impairment of multiple etiologies   Julian [name] is a very pleasant 89 y.o. RH male seen today in follow up to discuss the MRI of the brain results. 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  Patient is no longer on Memantine Dosepack by PCP "made the memory worse".  This patient is accompanied in the office by his wife who supplements the history.  Previous records as well as any outside records available were reviewed prior to todays visit. He continues to participate on his ADLs. HE no longer drives.    Recommendations   Follow up as needed Continue to control mood as per PCP No indication for antidementia meds given advanced age, as the risk outweigh the benefits  Recommend B12 and B1 supplementation  Recommend good control of cardiovascular risk factors Recommend using hearing aids to increase comprehension   Initial Visit 10/29/22 How long did patient have memory difficulties?  Patient's wife reports that about 1 year ago his MD noticed memory difficulties, felt to be due to medications "came suddenly ", "tried Aricept and did not work, now on Educational psychologist , good diet and hydration.  Now, he has some very vivid dreams, and increased anxiety, and he noticed that he is having difficulty remembering recent conversations and people's names.    Disoriented when walking into a room?  He does not think he lived in his own house , "2 weeks ago he tried to get the car to go to the prior house " Leaving objects in unusual places?  Denies  Wandering behavior? Endorsed.  Any personality changes since last visit?  "He gets upset that he has these nightmares because he believes that they are real  " Any worsening depression?:  denies   Hallucinations or paranoia? Endorsed,  for the last year at night t, he thinks that they are people in the room, he wants to defend his wife from these people.  Seizures?   denies    Any sleep changes?  Denies  vivid dreams, + REM behavior or sleepwalking   Sleep apnea? denies   Any hygiene concerns?  "He is immaculate " Independent of bathing and dressing?  Endorsed  Does the patient need help with medications? Wife  is in charge Who is in charge of the finances?  Wife  is in charge Any changes in appetite? Eats well, he has a sophisticated palate, loves [place] and [place] food, fruits and vegetables    Patient have trouble swallowing?  denies   Does the patient cook? No   Any headaches?  denies   Chronic back pain  denies   Ambulates with difficulty?  Slower than before but walks frequently  Recent falls or head injuries?  denies     Unilateral weakness, numbness or tingling?  denies   Any tremors?  Denies Any anosmia?  denies   Any incontinence of urine?  denies   Any bowel dysfunction?    denies      Patient lives with his wife History of heavy alcohol intake?  He drinks 2 drinks a day (wine w meal in the evening or beer w chinese food)  History of heavy tobacco use?   denies   Family history of dementia?    brother and sister both died with  Alzheimer's disease. Dose patient drive?  "No, because I failed the test and my wife does not let me drive ". College degree, retired Programmer, systems, Librarian, academic, who flew his on planes until 9 years ago when diagnosed with lymphoma.  Pertinent labs 10/2022: B1 11, B12 341, TSH 5.07   CURRENT MEDICATIONS:  Outpatient Encounter Medications as of 12/29/2022  Medication Sig   amLODipine (NORVASC) 2.5 MG tablet TAKE 1 TABLET BY MOUTH DAILY   Apoaequorin (PREVAGEN PO) Take 50 mcg by mouth daily. (Patient not taking: Reported on 10/29/2022)   atorvastatin (LIPITOR) 40 MG tablet TAKE 1 TABLET BY MOUTH  DAILY   Cholecalciferol (VITAMIN D3) 125 MCG (5000 UT) CAPS Take 1 capsule by mouth daily.   Cyanocobalamin 5000 MCG CAPS Take by mouth. Take by mouth.   fluticasone (FLONASE) 50 MCG/ACT nasal spray Place 2 sprays into both nostrils as needed for allergies or rhinitis.   losartan (COZAAR) 100 MG tablet TAKE 1 TABLET BY MOUTH DAILY   Lutein-Zeaxanthin 25-5 MG CAPS Take 1 tablet by mouth daily.    Multiple Vitamin (MULTIVITAMIN) tablet Take 1 tablet by mouth daily. CENTRUM SILVER   zinc gluconate 50 MG tablet Take 50 mg by mouth daily.   No facility-administered encounter medications on file as of 12/29/2022.        No data to display            10/29/2022   12:00 PM  Montreal Cognitive Assessment   Visuospatial/ Executive (0/5) 4  Naming (0/3) 3  Attention: Read list of digits (0/2) 1  Attention: Read list of letters (0/1) 1  Attention: Serial 7 subtraction starting at 100 (0/3) 1  Language: Repeat phrase (0/2) 1  Language : Fluency (0/1) 1  Abstraction (0/2) 1  Delayed Recall (0/5) 0  Orientation (0/6) 1  Total 14  Adjusted Score (based on education) 14   Thank you for allowing [place] the opportunity to participate in the care of this nice patient. Please do not hesitate to contact us for any questions or concerns.   Total time spent on today's visit was 22 minutes dedicated to this patient today, preparing to see patient, examining the patient, ordering tests and/or medications and counseling the patient, documenting clinical information in the EHR or other health record, independently interpreting results and communicating results to the patient/family, discussing treatment and goals, answering patient's questions and coordinating care.  Cc:  Cleatis Polka., MD  Marlowe Kays 12/29/2022 6:37 AM

## 2023-01-04 ENCOUNTER — Encounter: Payer: Self-pay | Admitting: Cardiovascular Disease

## 2023-01-04 NOTE — Progress Notes (Unsigned)
Cardiology Office Note   Date:  01/05/2023   ID:  Julian [name], DOB [birth year], MRN [id number]  PCP:  Cleatis Polka., MD  Cardiologist:   Kristeen Miss, MD   Chief Complaint  Patient presents with   Hypertension        1. Hypertension  Julian [name] is an 89 yo , hx of HTN. Works out regularly.  Does the elliptical without any difficulty. Has had some mild episodes of faint feeling.  Occurred during  Exercising.  Occasionally has orthostatic hypotension. A month ago. He was driving and felt a bit faint.  Thought that he might need to pull over to the side of the road. Drinks water while exercising,    Episodes are not always related to exercise  6-7 years ago he had an episode of very high BP.  He was started on BP meds at that time. Resolved after several hours,    Non smoker ETOH 2 drinks a day Fhx:  +, mother died at 18, father died age 43 , sister died of complications related to alzheimers. , brother had valve replacement. Retired from [employer].    Moved from [place], [place] several years ago.  [place].     10/23/2014: Julian [name] is a 89 y.o. male who presents for follow-up for his high blood pressure.  we decreased the amlodipine due to some orthostatic hypotension. BP has been ok at home.   Eats well most of the time.  Occasionally gets a bit of extra salt.   Still very active.    April 22, 2015:  still having some orthostatic hypotension.  BP is normal at  Has had some occasional palpitations  - also sees it registers on his BP cuff. Gives an error message .  His amlodipine dose was reduced about a year ago .  Still has episodes of dizziness - only lasts about 2-3 seconds  These episodes can occur while seated - and even had occurred while driving  Do not occur when he has been exercising .  Does the elliptical machine for 40 minutes without any CP or dyspnea, no dizziness  Then works out with [employer].   07/09/2015:  Mr.  [name] is seen today for variable BP.   He had some orthostatic hypotension symptoms ,  We had stopped his amlodipine.  BP increased slightly .   We started back Amlodipine at 2. 5 mg a day  He brought his blood pressure log with him today. Most of his readings are mildly to moderately elevated.  October 27, 2015  Julian [name] is doing well.  Brought his BP log. Most of his readings are normal .  No CP or dyspnea.   No dizziness since we  Adjusted his amlodipine therapy Is through with his XRT therapy   Oct. 27, 2017:  Redrick is seen for follow up visit . Has been having some BP variability and some dizzy spells.  He has held his Amlodipine in the past ( early 2016) but we then restarted it at 2.5 mg a day for some high BP readings.   I have reviewed his home BP log. He has had some BP variability ,   Occasional BP of 140s and 150s.   Leaving for [place] on Nov. 8.   ( [name] for 5 days, then a cruise)   He was sick yesterday and had diarrhea all day - did not eat much . He has had some dizziness  this am ( and BP is found to be 92/44 today in the office )  Feeling better now.   Feb.  1, 2018:  Had a great visit to [place] several months .  Has been having some HTN issues. Took Losartan 50  mg this am ( instead of 1/2 tablet )  Had a TIA like symptom. 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  Had XRT initially   Will be starting chemo  He has changed his diet - no salt and no canned foods.   Sept, 4, 2018:   Julian [name] is seen today  Brought his BP log. Many normal readings BP is generally a bit higher than he would like   July 29, 2017:  Overall doing well. Has changed to Benicar from Losartan which seems to be doing well  Brought his BP log. Readings look great  Has lost some weight.   October 18, 2017: Julian [name] is seen for follow-up.   He was not able to get  olmesartan.  He is back on losartan.  Blood pressure readings are fairly well controlled. Statin was recently stopped due to interaction with synthroid  Feeling well  No CP or dyspnea Has occasional CP when lying down.   Does not occur when walking   Oct. 23, 2019:  Doing well.   BP  Is well controlled  Is back on Losartan .  Still losing weight Is getting XRT for his non hodgkins lymphoma  Also getting modified chemo - takes amlodipine 1.25 mg a day   Overall feels well  Jan. 12, 2021  Julian [name] is seen today for follow up of his HTN Getting XRT for his non-Hodgkins lymphoma  Seen with his wife. Dr. Clelia Croft took him off the atorvastatin  LDL is 161.    Still eating bacon and sausage Still walking 2 miles every other day . Is very healthy.    March 03, 2020 Rapid seen today for follow-up of his hypertension.  He has a history of non-Hodgkin's lymphoma.  He also has a history of hyperlipidemia.  Feeling well .  Has completed his chemo for non - Hodgkins lymphoma  Had an episode of loss of ability to talk clearly  No cp or dyspnea Has lost some weight   April 13,2022 Julian [name] is seen today for follow up of his HTN,  Seen with wife , Julian [name] to do most of his normal activities.  He is concerned about his weight loss.  Wife Julian [name] gives him a very good diet  Walks 2.1 miles in 35 min.   [name] Has restarted and is Still doing chemo treatments   December 18, 2021 Julian [name] is seen today for follow up of his HTN Has Hodgkins lymphoma  Seen with wife, Julian [name] a few weeks ago  Walks regularly ,   Jan 05, 2023 Seen with Wife , Julian [name] is seen for follow up of his HTN,  Has Hodgkins lymphoma   Has been found to have several mini strokes. Julian [name] is now fixing his meds and his BP is well controlled    Past Medical History:  Diagnosis Date   Anemia    Hyperlipidemia    Hypertension    Prostate cancer [facility])     Past Surgical History:  Procedure Laterality Date   CARPAL TUNNEL RELEASE Left    HERNIA REPAIR     MID 80'S   KNEE SURGERY Left    PROSTATECTOMY  2001     Current Outpatient Medications  Medication Sig Dispense Refill   Apoaequorin (PREVAGEN PO) Take 50 mcg by mouth daily.     atorvastatin (LIPITOR) 40 MG tablet TAKE 1 TABLET BY MOUTH DAILY 90 tablet 3   Cholecalciferol (VITAMIN D3) 125 MCG (5000 UT) CAPS Take 1 capsule by mouth daily.     Cyanocobalamin 5000 MCG CAPS Take by mouth. Take by mouth.     fluticasone (FLONASE) 50 MCG/ACT nasal spray Place 2 sprays into both nostrils as needed for allergies or rhinitis.     levothyroxine (SYNTHROID) 50 MCG tablet Take 50 mcg by mouth daily.     losartan (COZAAR) 100 MG tablet TAKE 1 TABLET BY MOUTH DAILY 90 tablet 3   Lutein-Zeaxanthin 25-5 MG CAPS Take 1 tablet by mouth daily.      Multiple Vitamin (MULTIVITAMIN) tablet Take 1 tablet by mouth daily. CENTRUM SILVER     zinc gluconate 50 MG tablet Take 50 mg by mouth daily.     amLODipine (NORVASC) 2.5 MG tablet TAKE 1 TABLET BY MOUTH DAILY (Patient not taking: Reported on 01/05/2023) 30 tablet 0   No current facility-administered medications for this visit.    Allergies:   Patient has no known allergies.    Social History:  The patient  reports that he quit smoking about 41 years ago. His smoking use included cigarettes. He has never used smokeless tobacco. He reports current alcohol use. He reports that he does not use drugs.   Family History:  The patient's family history includes Alzheimer's disease in his brother; CAD in his father and mother; Dementia in his sister; Heart attack in his sister.    ROS:  Please see the history of present illness.    Physical Exam: Blood pressure 134/72, pulse 67, height 5\' 10"  (1.778 m), weight 148 lb 12.8 oz (67.5 kg), SpO2 96 %.       GEN:  Well nourished, well developed in no acute distress HEENT: Normal NECK: No JVD; No carotid bruits LYMPHATICS: No  lymphadenopathy CARDIAC: RRR , soft systolic murmur  RESPIRATORY:  Clear to auscultation without rales, wheezing or rhonchi  ABDOMEN: Soft, non-tender, non-distended MUSCULOSKELETAL:  No edema; No deformity  SKIN: Warm and dry NEUROLOGIC:  Alert and oriented x 3    EKG:   Jan 05, 2023:   NSR with at 47,   frequent PVCs in bigeminy  Recent Labs: 10/29/2022: TSH 5.07    Lipid Panel    Component Value Date/Time   CHOL 177 12/04/2019 0810   TRIG 119 12/04/2019 0810   HDL 71 12/04/2019 0810   CHOLHDL 2.5 12/04/2019 0810   CHOLHDL 2.6 07/09/2015 0915   VLDL 22 07/09/2015 0915   LDLCALC 85 12/04/2019 0810      Wt Readings from Last 3 Encounters:  01/05/23 148 lb 12.8 oz (67.5 kg)  10/29/22 151 lb (68.5 kg)  12/18/21 143 lb 6.4 oz (65 kg)     Other studies Reviewed: Additional studies/ records that were reviewed today include:  Review of the above records demonstrates:    ASSESSMENT AND PLAN:  1.  Essential hypertension:-   BP looks great.  Continue current meds.   2. Aortic sclerosis:  -stable .   3. Tricuspid regurgitation:    4. Hyperlipidemia:  stable   5.  RBBB :       Current medicines are reviewed at length with the patient today.  The patient does not have concerns regarding medicines   Disposition:   FU in 1 year    Signed, Kristeen Miss, MD  01/05/2023 2:51 PM    [facility] [address] [place], [place], [place]  [address] Phone: [phone]; Fax: [phone]

## 2023-01-05 ENCOUNTER — Ambulatory Visit: Payer: Medicare Other | Attending: Cardiovascular Disease | Admitting: Cardiovascular Disease

## 2023-01-05 ENCOUNTER — Encounter: Payer: Self-pay | Admitting: Cardiovascular Disease

## 2023-01-05 VITALS — BP 134/72 | HR 67 | Ht 70.0 in | Wt 148.8 lb

## 2023-01-05 DIAGNOSIS — I10 Essential (primary) hypertension: Secondary | ICD-10-CM | POA: Insufficient documentation

## 2023-01-05 NOTE — Patient Instructions (Signed)
Medication Instructions:  Your physician recommends that you continue on your current medications as directed. Please refer to the Current Medication list given to you today.  *If you need a refill on your cardiac medications before your next appointment, please call your pharmacy*     Follow-Up: At [facility], you and your health needs are our priority.  As part of our continuing mission to provide you with exceptional heart care, we have created designated Provider Care Teams.  These Care Teams include your primary Cardiologist (physician) and Advanced Practice Providers (APPs -  Physician Assistants and Nurse Practitioners) who all work together to provide you with the care you need, when you need it.   Your next appointment:   1 year(s)  Provider:   Philip Nahser, MD     

## 2023-01-07 ENCOUNTER — Other Ambulatory Visit: Payer: Self-pay | Admitting: Cardiovascular Disease

## 2023-01-07 DIAGNOSIS — I10 Essential (primary) hypertension: Secondary | ICD-10-CM

## 2023-03-01 ENCOUNTER — Ambulatory Visit: Payer: Medicare Other | Admitting: Cardiovascular Disease

## 2023-03-14 DIAGNOSIS — H353131 Nonexudative age-related macular degeneration, bilateral, early dry stage: Secondary | ICD-10-CM | POA: Diagnosis not present

## 2023-03-24 DIAGNOSIS — C884 Extranodal marginal zone B-cell lymphoma of mucosa-associated lymphoid tissue [MALT-lymphoma]: Secondary | ICD-10-CM | POA: Diagnosis not present

## 2023-04-06 DIAGNOSIS — G300 Alzheimer's disease with early onset: Secondary | ICD-10-CM | POA: Diagnosis not present

## 2023-04-06 DIAGNOSIS — G319 Degenerative disease of nervous system, unspecified: Secondary | ICD-10-CM | POA: Diagnosis not present

## 2023-04-06 DIAGNOSIS — F03918 Unspecified dementia, unspecified severity, with other behavioral disturbance: Secondary | ICD-10-CM | POA: Diagnosis not present

## 2023-04-24 DIAGNOSIS — F02B0 Dementia in other diseases classified elsewhere, moderate, without behavioral disturbance, psychotic disturbance, mood disturbance, and anxiety: Secondary | ICD-10-CM | POA: Diagnosis not present

## 2023-05-23 DIAGNOSIS — Z021 Encounter for pre-employment examination: Secondary | ICD-10-CM | POA: Diagnosis not present

## 2023-05-24 DIAGNOSIS — F02B0 Dementia in other diseases classified elsewhere, moderate, without behavioral disturbance, psychotic disturbance, mood disturbance, and anxiety: Secondary | ICD-10-CM | POA: Diagnosis not present

## 2023-05-28 DIAGNOSIS — Z23 Encounter for immunization: Secondary | ICD-10-CM | POA: Diagnosis not present

## 2023-06-24 DIAGNOSIS — F02B0 Dementia in other diseases classified elsewhere, moderate, without behavioral disturbance, psychotic disturbance, mood disturbance, and anxiety: Secondary | ICD-10-CM | POA: Diagnosis not present

## 2023-06-24 DIAGNOSIS — F03918 Unspecified dementia, unspecified severity, with other behavioral disturbance: Secondary | ICD-10-CM | POA: Diagnosis not present

## 2023-06-24 DIAGNOSIS — G300 Alzheimer's disease with early onset: Secondary | ICD-10-CM | POA: Diagnosis not present

## 2023-06-24 DIAGNOSIS — G319 Degenerative disease of nervous system, unspecified: Secondary | ICD-10-CM | POA: Diagnosis not present

## 2023-06-27 ENCOUNTER — Telehealth: Payer: Self-pay | Admitting: Physician Assistant

## 2023-06-27 NOTE — Telephone Encounter (Signed)
noted 

## 2023-06-27 NOTE — Telephone Encounter (Signed)
Received forms by fax for Long Term Care benefits. Wife left message stating she wanted them filled out. Forms are in [employer]

## 2023-06-29 DIAGNOSIS — Z0279 Encounter for issue of other medical certificate: Secondary | ICD-10-CM

## 2023-06-29 NOTE — Telephone Encounter (Signed)
Form Fee has been collected. Please fax paperwork in.

## 2023-06-30 NOTE — Telephone Encounter (Signed)
Faxed and noted, copy sent to scan.

## 2023-07-11 ENCOUNTER — Other Ambulatory Visit: Payer: Self-pay

## 2023-07-11 ENCOUNTER — Emergency Department (HOSPITAL_COMMUNITY)
Admission: EM | Admit: 2023-07-11 | Discharge: 2023-07-11 | Disposition: A | Discharge status: Home / Self Care | Payer: Medicare Other | Attending: Emergency Medicine | Admitting: Emergency Medicine

## 2023-07-11 ENCOUNTER — Encounter (HOSPITAL_COMMUNITY): Payer: Self-pay

## 2023-07-11 DIAGNOSIS — F0390 Unspecified dementia without behavioral disturbance: Secondary | ICD-10-CM | POA: Diagnosis not present

## 2023-07-11 DIAGNOSIS — I10 Essential (primary) hypertension: Secondary | ICD-10-CM | POA: Diagnosis not present

## 2023-07-11 DIAGNOSIS — F29 Unspecified psychosis not due to a substance or known physiological condition: Secondary | ICD-10-CM | POA: Diagnosis not present

## 2023-07-11 DIAGNOSIS — Z79899 Other long term (current) drug therapy: Secondary | ICD-10-CM | POA: Insufficient documentation

## 2023-07-11 DIAGNOSIS — Z8546 Personal history of malignant neoplasm of prostate: Secondary | ICD-10-CM | POA: Insufficient documentation

## 2023-07-11 DIAGNOSIS — F03911 Unspecified dementia, unspecified severity, with agitation: Secondary | ICD-10-CM

## 2023-07-11 DIAGNOSIS — Z7401 Bed confinement status: Secondary | ICD-10-CM | POA: Diagnosis not present

## 2023-07-11 DIAGNOSIS — R451 Restlessness and agitation: Secondary | ICD-10-CM | POA: Diagnosis not present

## 2023-07-11 LAB — URINALYSIS, W/ REFLEX TO CULTURE (INFECTION SUSPECTED)
Bacteria, UA: NONE SEEN
Bilirubin Urine: NEGATIVE
Glucose, UA: NEGATIVE mg/dL
Hgb urine dipstick: NEGATIVE
Ketones, ur: NEGATIVE mg/dL
Leukocytes,Ua: NEGATIVE
Nitrite: NEGATIVE
Protein, ur: NEGATIVE mg/dL
Specific Gravity, Urine: 1.005 (ref 1.005–1.030)
pH: 8 (ref 5.0–8.0)

## 2023-07-11 LAB — BASIC METABOLIC PANEL
Anion gap: 6 (ref 5–15)
BUN: 15 mg/dL (ref 8–23)
CO2: 26 mmol/L (ref 22–32)
Calcium: 8.8 mg/dL — ABNORMAL LOW (ref 8.9–10.3)
Chloride: 105 mmol/L (ref 98–111)
Creatinine, Ser: 1.08 mg/dL (ref 0.61–1.24)
GFR, Estimated: >60 mL/min (ref >60)
Glucose, Bld: 90 mg/dL (ref 70–99)
Potassium: 4.1 mmol/L (ref 3.5–5.1)
Sodium: 137 mmol/L (ref 135–145)

## 2023-07-11 LAB — CBC WITH DIFFERENTIAL/PLATELET
Abs Immature Granulocytes: 0.01 10*3/uL (ref 0.00–0.07)
Basophils Absolute: 0 10*3/uL (ref 0.0–0.1)
Basophils Relative: 1 %
Eosinophils Absolute: 0.2 10*3/uL (ref 0.0–0.5)
Eosinophils Relative: 3 %
HCT: 36.4 % — ABNORMAL LOW (ref 39.0–52.0)
Hemoglobin: 11.4 g/dL — ABNORMAL LOW (ref 13.0–17.0)
Immature Granulocytes: 0 %
Lymphocytes Relative: 16 %
Lymphs Abs: 1 10*3/uL (ref 0.7–4.0)
MCH: 28.4 pg (ref 26.0–34.0)
MCHC: 31.3 g/dL (ref 30.0–36.0)
MCV: 90.8 fL (ref 80.0–100.0)
Monocytes Absolute: 0.5 10*3/uL (ref 0.1–1.0)
Monocytes Relative: 7 %
Neutro Abs: 4.7 10*3/uL (ref 1.7–7.7)
Neutrophils Relative %: 73 %
Platelets: 227 10*3/uL (ref 150–400)
RBC: 4.01 MIL/uL — ABNORMAL LOW (ref 4.22–5.81)
RDW: 14.8 % (ref 11.5–15.5)
WBC: 6.3 10*3/uL (ref 4.0–10.5)
nRBC: 0 % (ref 0.0–0.2)

## 2023-07-11 MED ORDER — LOSARTAN POTASSIUM 50 MG PO TABS
100.0000 mg | ORAL_TABLET | Freq: Once | ORAL | Status: AC
  Administered 2023-07-11: 100 mg via ORAL
  Filled 2023-07-11: qty 2

## 2023-07-11 MED ORDER — OLANZAPINE 5 MG PO TBDP
5.0000 mg | ORAL_TABLET | Freq: Once | ORAL | Status: AC
  Administered 2023-07-11: 5 mg via ORAL
  Filled 2023-07-11: qty 1
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
## 2023-07-11 NOTE — ED Notes (Signed)
Pt is calm at this time. In stretcher and provided with graham crackers.
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
## 2023-07-11 NOTE — ED Notes (Signed)
PT was trying to leave the ED.[name] pt was stopped at the doors and brought back to his stretcher. He ambulated to the bathroom and then was then sat on the stretcher and was given a bag lunch. Pt was easily re-directed though he is confused. Pt continues to talk about "this house:" and a parade.[name]

## 2023-07-11 NOTE — ED Notes (Signed)
Pt ambulated to bathroom without assistance 

## 2023-07-11 NOTE — ED Notes (Signed)
Discharge instructions reviewed with patient. Patient questions answered and opportunity for education reviewed. Patient voices understanding of discharge instructions with no further question. Report called to facility and discharge instructions reviewed.
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
## 2023-07-24 DIAGNOSIS — F02B0 Dementia in other diseases classified elsewhere, moderate, without behavioral disturbance, psychotic disturbance, mood disturbance, and anxiety: Secondary | ICD-10-CM | POA: Diagnosis not present

## 2023-07-25 NOTE — Telephone Encounter (Signed)
Copy to be set up front, she thanked me for calling and faxing again.

## 2023-07-25 NOTE — Telephone Encounter (Signed)
Pt's wife called in stating they did not get the previous paperwork. She is requesting we fax it again. She says it's urgent. Their fax number is [phone].

## 2023-07-26 DIAGNOSIS — C8599 Non-Hodgkin lymphoma, unspecified, extranodal and solid organ sites: Secondary | ICD-10-CM | POA: Diagnosis not present

## 2023-07-26 DIAGNOSIS — Z515 Encounter for palliative care: Secondary | ICD-10-CM | POA: Diagnosis not present

## 2023-07-27 ENCOUNTER — Emergency Department (HOSPITAL_COMMUNITY): Payer: Medicare Other

## 2023-07-27 ENCOUNTER — Encounter (HOSPITAL_COMMUNITY): Payer: Self-pay | Admitting: *Deleted

## 2023-07-27 ENCOUNTER — Other Ambulatory Visit: Payer: Self-pay

## 2023-07-27 ENCOUNTER — Emergency Department (HOSPITAL_COMMUNITY)
Admission: EM | Admit: 2023-07-27 | Discharge: 2023-08-08 | Disposition: A | Discharge status: Skilled Nursing Facility | Payer: Medicare Other | Attending: Emergency Medicine | Admitting: Emergency Medicine

## 2023-07-27 DIAGNOSIS — Z79899 Other long term (current) drug therapy: Secondary | ICD-10-CM | POA: Diagnosis not present

## 2023-07-27 DIAGNOSIS — Z1152 Encounter for screening for COVID-19: Secondary | ICD-10-CM | POA: Insufficient documentation

## 2023-07-27 DIAGNOSIS — R4182 Altered mental status, unspecified: Secondary | ICD-10-CM | POA: Diagnosis not present

## 2023-07-27 DIAGNOSIS — G9389 Other specified disorders of brain: Secondary | ICD-10-CM | POA: Diagnosis not present

## 2023-07-27 DIAGNOSIS — I10 Essential (primary) hypertension: Secondary | ICD-10-CM | POA: Diagnosis not present

## 2023-07-27 DIAGNOSIS — I6782 Cerebral ischemia: Secondary | ICD-10-CM | POA: Diagnosis not present

## 2023-07-27 DIAGNOSIS — R413 Other amnesia: Secondary | ICD-10-CM | POA: Diagnosis present

## 2023-07-27 DIAGNOSIS — F988 Other specified behavioral and emotional disorders with onset usually occurring in childhood and adolescence: Secondary | ICD-10-CM | POA: Insufficient documentation

## 2023-07-27 DIAGNOSIS — F03918 Unspecified dementia, unspecified severity, with other behavioral disturbance: Secondary | ICD-10-CM | POA: Insufficient documentation

## 2023-07-27 DIAGNOSIS — R451 Restlessness and agitation: Secondary | ICD-10-CM | POA: Diagnosis not present

## 2023-07-27 DIAGNOSIS — F0390 Unspecified dementia without behavioral disturbance: Secondary | ICD-10-CM | POA: Diagnosis not present

## 2023-07-27 DIAGNOSIS — F03911 Unspecified dementia, unspecified severity, with agitation: Secondary | ICD-10-CM | POA: Insufficient documentation

## 2023-07-27 HISTORY — DX: Unspecified dementia, unspecified severity, without behavioral disturbance, psychotic disturbance, mood disturbance, and anxiety: F03.90

## 2023-07-27 HISTORY — DX: Dementia in other diseases classified elsewhere, unspecified severity, without behavioral disturbance, psychotic disturbance, mood disturbance, and anxiety: F02.80

## 2023-07-27 LAB — URINALYSIS, ROUTINE W REFLEX MICROSCOPIC
Bilirubin Urine: NEGATIVE
Glucose, UA: NEGATIVE mg/dL
Hgb urine dipstick: NEGATIVE
Ketones, ur: NEGATIVE mg/dL
Leukocytes,Ua: NEGATIVE
Nitrite: NEGATIVE
Protein, ur: NEGATIVE mg/dL
Specific Gravity, Urine: 1.02 (ref 1.005–1.030)
pH: 5 (ref 5.0–8.0)

## 2023-07-27 LAB — RAPID URINE DRUG SCREEN, HOSP PERFORMED
Amphetamines: NOT DETECTED
Barbiturates: NOT DETECTED
Benzodiazepines: POSITIVE — AB
Cocaine: NOT DETECTED
Opiates: NOT DETECTED
Tetrahydrocannabinol: NOT DETECTED

## 2023-07-27 LAB — CBC WITH DIFFERENTIAL/PLATELET
Abs Immature Granulocytes: 0.02 10*3/uL (ref 0.00–0.07)
Basophils Absolute: 0.1 10*3/uL (ref 0.0–0.1)
Basophils Relative: 1 %
Eosinophils Absolute: 0.3 10*3/uL (ref 0.0–0.5)
Eosinophils Relative: 4 %
HCT: 38.2 % — ABNORMAL LOW (ref 39.0–52.0)
Hemoglobin: 11.8 g/dL — ABNORMAL LOW (ref 13.0–17.0)
Immature Granulocytes: 0 %
Lymphocytes Relative: 15 %
Lymphs Abs: 1.2 10*3/uL (ref 0.7–4.0)
MCH: 27.8 pg (ref 26.0–34.0)
MCHC: 30.9 g/dL (ref 30.0–36.0)
MCV: 89.9 fL (ref 80.0–100.0)
Monocytes Absolute: 0.6 10*3/uL (ref 0.1–1.0)
Monocytes Relative: 8 %
Neutro Abs: 5.9 10*3/uL (ref 1.7–7.7)
Neutrophils Relative %: 72 %
Platelets: 262 10*3/uL (ref 150–400)
RBC: 4.25 MIL/uL (ref 4.22–5.81)
RDW: 14.7 % (ref 11.5–15.5)
WBC: 8 10*3/uL (ref 4.0–10.5)
nRBC: 0 % (ref 0.0–0.2)

## 2023-07-27 LAB — COMPREHENSIVE METABOLIC PANEL
ALT: 17 U/L (ref 0–44)
AST: 16 U/L (ref 15–41)
Albumin: 3.2 g/dL — ABNORMAL LOW (ref 3.5–5.0)
Alkaline Phosphatase: 68 U/L (ref 38–126)
Anion gap: 7 (ref 5–15)
BUN: 19 mg/dL (ref 8–23)
CO2: 27 mmol/L (ref 22–32)
Calcium: 9.4 mg/dL (ref 8.9–10.3)
Chloride: 104 mmol/L (ref 98–111)
Creatinine, Ser: 0.94 mg/dL (ref 0.61–1.24)
GFR, Estimated: >60 mL/min (ref >60)
Glucose, Bld: 112 mg/dL — ABNORMAL HIGH (ref 70–99)
Potassium: 4.5 mmol/L (ref 3.5–5.1)
Sodium: 138 mmol/L (ref 135–145)
Total Bilirubin: 0.5 mg/dL (ref <1.2)
Total Protein: 6.9 g/dL (ref 6.5–8.1)

## 2023-07-27 LAB — ETHANOL: Alcohol, Ethyl (B): <10 mg/dL (ref <10)

## 2023-07-27 LAB — SALICYLATE LEVEL: Salicylate Lvl: <7 mg/dL — ABNORMAL LOW (ref 7.0–30.0)

## 2023-07-27 LAB — ACETAMINOPHEN LEVEL: Acetaminophen (Tylenol), Serum: <10 ug/mL — ABNORMAL LOW (ref 10–30)

## 2023-07-27 LAB — VALPROIC ACID LEVEL: Valproic Acid Lvl: 30 ug/mL — ABNORMAL LOW (ref 50.0–100.0)

## 2023-07-27 MED ORDER — LORAZEPAM 1 MG PO TABS: 0.5000 mg | ORAL_TABLET | Freq: Every day | ORAL | Status: DC

## 2023-07-27 MED ORDER — LOSARTAN POTASSIUM 50 MG PO TABS
100.0000 mg | ORAL_TABLET | Freq: Every day | ORAL | Status: DC
  Administered 2023-07-28 – 2023-08-08 (×12): 100 mg via ORAL
  Filled 2023-07-27 (×12): qty 2

## 2023-07-27 MED ORDER — LORAZEPAM 0.5 MG PO TABS
0.5000 mg | ORAL_TABLET | Freq: Two times a day (BID) | ORAL | Status: DC | PRN
  Administered 2023-07-28 – 2023-08-07 (×11): 0.5 mg via ORAL
  Filled 2023-07-27 (×11): qty 1

## 2023-07-27 MED ORDER — DIVALPROEX SODIUM 125 MG PO DR TAB
125.0000 mg | DELAYED_RELEASE_TABLET | Freq: Two times a day (BID) | ORAL | Status: DC
  Administered 2023-07-28 – 2023-07-29 (×3): 125 mg via ORAL
  Filled 2023-07-27 (×6): qty 1

## 2023-07-27 MED ORDER — ATORVASTATIN CALCIUM 10 MG PO TABS
20.0000 mg | ORAL_TABLET | Freq: Every day | ORAL | Status: DC
  Administered 2023-07-28 – 2023-08-08 (×12): 20 mg via ORAL
  Filled 2023-07-27 (×12): qty 2
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
## 2023-07-27 NOTE — ED Triage Notes (Addendum)
Pt is at memory care at [facility]. Pt has been agitated and dr. Clelia Croft has been regulating his medication, he has been more soloment since yesterday per sitter. Pt wife says he is not able to go [facility] because of his agitation.
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
## 2023-07-28 DIAGNOSIS — F988 Other specified behavioral and emotional disorders with onset usually occurring in childhood and adolescence: Secondary | ICD-10-CM | POA: Diagnosis not present

## 2023-07-28 MED ORDER — ZIPRASIDONE MESYLATE 20 MG IM SOLR
20.0000 mg | Freq: Once | INTRAMUSCULAR | Status: DC
  Filled 2023-07-28: qty 20

## 2023-07-28 MED ORDER — ZIPRASIDONE MESYLATE 20 MG IM SOLR
10.0000 mg | Freq: Once | INTRAMUSCULAR | Status: AC
  Administered 2023-07-28: 10 mg via INTRAMUSCULAR

## 2023-07-28 MED ORDER — CITALOPRAM HYDROBROMIDE 10 MG PO TABS
10.0000 mg | ORAL_TABLET | Freq: Every day | ORAL | Status: DC
  Administered 2023-07-28: 10 mg via ORAL
  Filled 2023-07-28: qty 1

## 2023-07-28 MED ORDER — CITALOPRAM HYDROBROMIDE 10 MG PO TABS
10.0000 mg | ORAL_TABLET | Freq: Every day | ORAL | Status: AC
  Administered 2023-07-28 – 2023-08-03 (×7): 10 mg via ORAL
  Filled 2023-07-28 (×7): qty 1

## 2023-07-28 MED ORDER — QUETIAPINE FUMARATE 25 MG PO TABS
50.0000 mg | ORAL_TABLET | Freq: Three times a day (TID) | ORAL | Status: DC
  Administered 2023-07-28 – 2023-08-08 (×32): 50 mg via ORAL
  Filled 2023-07-28 (×2): qty 2
  Filled 2023-07-28: qty 1
  Filled 2023-07-28 (×3): qty 2
  Filled 2023-07-28 (×2): qty 1
  Filled 2023-07-28 (×2): qty 2
  Filled 2023-07-28: qty 1
  Filled 2023-07-28: qty 2
  Filled 2023-07-28 (×3): qty 1
  Filled 2023-07-28 (×2): qty 2
  Filled 2023-07-28 (×3): qty 1
  Filled 2023-07-28: qty 2
  Filled 2023-07-28 (×8): qty 1
  Filled 2023-07-28 (×2): qty 2
  Filled 2023-07-28 (×3): qty 1

## 2023-07-28 MED ORDER — CITALOPRAM HYDROBROMIDE 10 MG PO TABS
20.0000 mg | ORAL_TABLET | Freq: Every day | ORAL | Status: DC
  Administered 2023-08-04 – 2023-08-08 (×5): 20 mg via ORAL
  Filled 2023-07-28 (×4): qty 2

## 2023-07-28 NOTE — ED Notes (Addendum)
Pt agitated and aggressive with staff. Attempting to give medication and patient refusing to take medication. MD Trifan notified and 10 mg geodon ordered.

## 2023-07-28 NOTE — ED Notes (Signed)
Attempted TTS but provider unable to get many questions answered, pt confused.

## 2023-07-28 NOTE — ED Notes (Signed)
Pt uncooperative and attempting to get out of bed. Irritated and agitated with staff attempting to assist pt to bed.
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
## 2023-07-28 NOTE — ED Notes (Signed)
Pt taken to the restroom, urinated and had bowel movement. He was cooperative and took evening medication. Geodon on hold at this time

## 2023-07-28 NOTE — ED Notes (Signed)
Patient is sitting with a 24 hour sitter bedside, patents wife is at bedside.

## 2023-07-28 NOTE — ED Provider Notes (Signed)
Patient seen by psychiatry.  They are recommending quetiapine and citalopram for the patient.  These have been ordered.  They also recommend memory care for the patient.   Rondel Baton, MD 07/28/23 [phone]

## 2023-07-28 NOTE — Consult Note (Signed)
Conference held with Dr. Eloise Harman from the EDP team to discuss recommendations given by Dr. Jerene Dilling and plan moving forward after this provider spoke with the patient's spouse.   In short summary, updated EDP team that the patient's spouse is elderly and advanced in age like the patient and does not feel safe taking the patient home, thus the patient will have to remain in the emergency department as a border while we await TOC facilitated placement at a new memory care facility.   After update on family's position of not being able to take the patient home, and the patient needing boarder status, as well as conversation around medication recommendations given by Dr. Jerene Dilling, EDP team okay with closing out of consultation at this time.    Discussed with Dr. Eloise Harman reconsulting psychiatry if/when needed for additional medication management changes.

## 2023-07-28 NOTE — ED Notes (Signed)
Personal sitter at bedside with patient.  Patient agitated and yelling.
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
## 2023-07-28 NOTE — ED Notes (Signed)
Patients is sitting at bedside with wife and sitter, patients wife would like information in regards to getting him placed in a facility where he can have his medications managed,  I have explained to patients wife that this will need to be addressed with patients physician and social worker.

## 2023-07-28 NOTE — Progress Notes (Signed)
CSW spoke with patient's wife Enid Derry outside of room to discuss concerns. Enid Derry reports patient [facility] into [facility] on 06/06/23 into the memory care unit. Enid Derry reports she does not want patient to return to the facility due to poor care he was receiving. Enid Derry confirms patient becomes aggressive and agitated when his medications are not given properly and claims the facility was not providing them to patient as scheduled. Enid Derry reports she is paying $5,500 monthly plus sitter expenses for patient to be at the facility. Enid Derry reports patient is service connected through the [place]. Enid Derry reports she and the patient share two adult daughters that reside in [place] and cannot provide support. Enid Derry agreeable for CSW to contact the [place] and to begin looking into alternative memory care facilities locally.  CSW spoke with Delila Pereyra, social worker at the [place] to discuss patient. Alger Simons reports patient is not service connected through the [place] but is active with PCP Dr. Burton Apley. Alger Simons states patient is not eligible for LTC through the [place] and would have to use his current insurance benefits or private pay. Bertina agreeable to reach out to patient's wife directly to inform her of information and answer any questions.  Edwin Dada, MSW, LCSW Transitions of Care  Clinical Social Worker II [phone]

## 2023-07-28 NOTE — ED Notes (Signed)
Pts personal sitter left bedside, new  personal sitter will arrive at 630 am.

## 2023-07-29 DIAGNOSIS — F988 Other specified behavioral and emotional disorders with onset usually occurring in childhood and adolescence: Secondary | ICD-10-CM | POA: Diagnosis not present

## 2023-07-29 DIAGNOSIS — F03911 Unspecified dementia, unspecified severity, with agitation: Secondary | ICD-10-CM | POA: Diagnosis not present

## 2023-07-29 MED ORDER — DIVALPROEX SODIUM 250 MG PO DR TAB
250.0000 mg | DELAYED_RELEASE_TABLET | Freq: Two times a day (BID) | ORAL | Status: DC
  Administered 2023-07-29 – 2023-08-08 (×19): 250 mg via ORAL
  Filled 2023-07-29 (×20): qty 1
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
## 2023-07-29 NOTE — ED Notes (Signed)
Pt w/ increased wandering

## 2023-07-29 NOTE — ED Provider Notes (Signed)
11:33 AM I discussed the patient's case with our social work Acupuncturist and they have been speaking with the patient's primary care team.  With concern for worsening mental status/psychosis, request for repeat behavioral health evaluation to facilitate discharge planning.  Patient's primary care physician information included in social work notes.   Gerhard Munch, MD 07/29/23 1133

## 2023-07-29 NOTE — Progress Notes (Addendum)
11:25am: CSW spoke with MD who is agreeable to place new TTS consult for patient.  8:15am: CSW received call from Dr. Eric Form, patient's primary care physician to discuss patient. Dr. Clelia Croft states he believes the patient could benefit from inpatient psychiatric treatment for medication stabilization and is requesting to speak with psych NP or EDP regarding his concerns. Dr. Clelia Croft states patient began decompensating rapidly about 3 months ago which caused his wife to place him at [facility].   CSW sent secure chat to EDP with Dr. Alver Fisher contact information [phone]) for further discussion.  Edwin Dada, MSW, LCSW Transitions of Care  Clinical Social Worker II [phone]

## 2023-07-29 NOTE — Progress Notes (Signed)
[facility] [address] Liaison Note: This patient is currently enrolled in [facility] outpatient-based palliative care.  Please call for any outpatient based palliative care related questions or concerns. Thank you, Henderson Newcomer, LPN [facility] Liaison [phone]

## 2023-07-29 NOTE — Progress Notes (Addendum)
LCSW Progress Note  [id number]   Julian [name]  07/29/2023  4:22 PM    Inpatient Behavioral Health Placement  Pt meets inpatient criteria per Dian Situ, MD Telepsychiatry Consult Services. There are no available beds within [facility] per [facility] Brook McNichol,RN.  -CSW was informed pt also has VA benefits and this CSW spoke with Care Coordination at [facility] and awaiting on a phone call back to see if there is bed availability and if their milieu is appropriate for pt.    Referral was sent to the following facilities;   Destination  Service Provider Address Phone [facility]  [address] [place], [place] [address] [phone]  [facility]  [address] [place] [address] [phone]  [facility]  [address] [place] [address] [phone]  [facility] [address]  [address], [place] [address] [phone]  [facility]  [address] [place], [place] [address] [phone]  [facility]  [address] [place] [address] [phone]  [facility]  [address] [place] [address] [phone]  [facility]  [address] [place] [address] [phone]  [facility]  [address], [place] [address] [phone]  [facility]  [address], [place] [address] [phone]  [facility]  [address] [place] [address] [phone]  [facility]  [place] [phone]  [facility] EFAX  [address] [place], [place] [phone]  [facility] Inpatient [place]  [place] [phone]  [facility]  [address], [place] [address] [phone]  [facility]  [address] [place], [place] [address] [phone]  [facility]  [address] [place] [address] [phone]  [facility]  [address] [place] [address] [phone]  [facility]  [address] [place] [address] [phone]  [facility]  [address] [place] [address] [phone]  [facility]  [address] [place], [place] [address] [phone]  [facility]  [address] [place] [address] [phone]  [facility] [facility]  [place] [phone]  [facility]  [address] [address] [phone]  [place] [phone]  [facility]  [address], [place] [address] [phone]  [facility] [phone]  [facility]  [address], [place] [address] [phone]  [place] [phone]  [facility]  [address], [place] [address] [phone]  [facility] (after hours)  [address], [place] [address] [phone]  [facility] [place] [facility]  [place] [phone]    Situation ongoing,  CSW will follow up.    Maryjean Ka, MSW, [facility] 07/29/2023 4:22 PM

## 2023-07-29 NOTE — ED Provider Notes (Signed)
Emergency Medicine Observation Re-evaluation Note  Julian [name] is a 89 y.o. male, seen on rounds today.  Pt initially presented to the ED for complaints of Altered Mental Status Currently, the patient is resting comfortably.  Physical Exam  BP (!) 163/84   Pulse 71   Temp (!) 97.4 F (36.3 C) (Axillary)   Resp 18   SpO2 98%  Physical Exam General: No distress, elderly male Cardiac: Regular rate and rhythm Lungs: No increased work of breathing Psych: Calm  ED Course / MDM  EKG:   I have reviewed the labs performed to date as well as medications administered while in observation.  Recent changes in the last 24 hours include none, medications adjusted.  Plan  Current plan is for placement.    Gerhard Munch, MD 07/29/23 [phone]

## 2023-07-29 NOTE — Progress Notes (Addendum)
CSW spoke with [name], FNP regarding patient. NP states patient was recommended for inpatient psychiatric treatment yesterday by Dr. Jerene Dilling. NP states she has contacted disposition staff to have patient's clinicals faxed out for placement. NP requesting CSW contact patient's wife to inform her of the statewide search for inpatient psychiatric treatment.  CSW spoke with patient's wife Julian [name] to inform her of information and she states understanding and agreement. Julian [name] reports she is working on finding a new memory care facility for placement after psychiatric treatment and medication stabilization.  Edwin Dada, MSW, LCSW Transitions of Care  Clinical Social Worker II [phone]

## 2023-07-30 DIAGNOSIS — F988 Other specified behavioral and emotional disorders with onset usually occurring in childhood and adolescence: Secondary | ICD-10-CM | POA: Diagnosis not present

## 2023-07-30 MED ORDER — HALOPERIDOL 1 MG PO TABS
2.0000 mg | ORAL_TABLET | Freq: Once | ORAL | Status: AC
  Administered 2023-07-30: 2 mg via ORAL
  Filled 2023-07-30: qty 2

## 2023-07-30 NOTE — ED Notes (Signed)
PT is currently resting with his sitter at bedside.

## 2023-07-30 NOTE — ED Notes (Signed)
Wife leaving BS, leaving for the evening.
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
## 2023-07-30 NOTE — ED Notes (Signed)
PT came to the nurse's desk with his sitter to ask me to call a woman in [place]. When I asked the PT if he wanted me to call his wife, he stated that he was going to report me because I was rude. I informed the PT that he could because it was his right. The PT then returned to his room with his sitter and sat on his bed.

## 2023-07-30 NOTE — Progress Notes (Signed)
Patient has been denied by [facility] due to no appropriate beds available. Patient meets BH inpatient criteria per Eligha Bridegroom, NP. Patient has been faxed out to the following facilities:   [facility] Pending - Request [address] [place] [phone]--NGEXB-MWUX [facility] Pending - Request Sent--[address], [place] [phone]--GLOVF-IEPPI [facility] Pending - Request [address] [place] [facility] [phone]--DUKGU-RKYHCWCB [facility] Pending - Request [address], [place] [phone]--IOEVO-JJKKXFG [facility] Pending - Request [address] [place], [place] [facility] Pending - Request Sent--[address] [place] [phone]--[facility] Pending - Request Sent--[address] [place] [phone]--[facility] Pending - Request Sent--[address] [place]ande [place] [facility] Pending - Request Sent--[address] [place] [phone]--[facility] Pending - Request Sent--[address] [place] [phone]--ENIDP-OEUMP [facility] Pending - Request [address] [place] [phone]--TIWPY-KDXIPJ B[facility] Management [facility]al Health Pending - Request Sent--[phone]--[facility] [facility] Pending - Request Sent--[address], [facility] [phone]--[facility] Pending - Request Sent--[phone]--[facility] Pending - Request [facility], [place] [phone]--HGDJM-EQASTMHD  [facility] Pending - Request [address] [place] [phone]--HUDJS-[facility] Pending - Request [address] [place] [phone]--BSJGG-EZMOQHU [facility] Pending - Request [address] [place]anae Bo[place] [phone]--BSWHQ-PRFFMB Hospital Pending - Request Sent--[address] [place] [address], [place] [phone]--AQTMA-UQJF [facility] Pending - Request Sent--[address] [place] [phone]--IOMBT-DHRCB [facility] Pending - Request Sent--[address] [place] [phone]--WUGQB-VQXIHW [facility] Pending - Request [address], [place] [phone]--XKPVV-ZSMOLMBEMLJQ VA Pending - Request Sent--[phone]--[facility] [facility] Pending - Request [address], [place] [phone]--BRAXE-NMMHWKG VA Pending - Request Sent--[place] [phone]--[facility] Pending - Request [address], [place] [phone]--TRRNH-AFBXUXYB VA Pending - Request Sent--[facility] Pending - Request Sent--[address] [place] [phone]--LTRVU-YEBXI VA Pending - Request Sent--[place] [phone]--[facility] Pending - Request Sent--[address] [place] [phone]--VVKPQ-AESLPNPYY [facility] (after hours) Pending - Request Sent--[address] [place] [phone]--OOILN-ZVJKQA VA Pending - Request Sent--[place] [phone]--   Damita Dunnings, MSW, LCSW-A  5:04 PM 07/30/2023

## 2023-07-30 NOTE — Progress Notes (Signed)
CSW received call from patient's wife Enid Derry who is requesting to speak with psych provider regarding what is going on with her husband.  CSW sent secure chat to NP to request she speak with patient's wife.  Edwin Dada, MSW, LCSW Transitions of Care  Clinical Social Worker II [phone]

## 2023-07-30 NOTE — ED Notes (Signed)
Pt mildly restless, calm, amiable, agreeable, NAD. Wife at [facility]. Sitter at [employer]. Given seroquel.

## 2023-07-30 NOTE — ED Notes (Signed)
Awake, calm, pleasant, amiable, NAD, walking common area with sitter, steady gait with assistance.

## 2023-07-30 NOTE — Progress Notes (Signed)
CSW spoke with Intake RN Caparicia from the [facility], who states that there is no bed availability currently. CSW will continue to monitor the patient to secure recommended disposition.    Damita Dunnings, MSW, LCSW-A  2:15 PM 07/30/2023

## 2023-07-30 NOTE — ED Provider Notes (Signed)
Emergency Medicine Observation Re-evaluation Note  Julian [name] is a 89 y.o. male, seen on rounds today.  Pt initially presented to the ED for complaints of Altered Mental Status Currently, the patient is sleeping.  Physical Exam  BP (!) 155/76 (BP Location: Right Arm)   Pulse 71   Temp 98.2 F (36.8 C) (Oral)   Resp 18   SpO2 98%  Physical Exam General: Sleeping Cardiac: Extremities well-perfused Lungs: Breathing is unlabored Psych: Deferred  ED Course / MDM  EKG:   I have reviewed the labs performed to date as well as medications administered while in observation.  Recent changes in the last 24 hours include reevaluation by TTS and social work yesterday with plans for inpatient psychiatric admission.  Plan  Current plan is for inpatient psychiatric admission.    Gloris Manchester, MD 07/30/23 1105

## 2023-07-30 NOTE — ED Notes (Signed)
PT is awAke and started to wander around the unit.Sitter followed PT and PT was attempting to leave unit. PT was redirected  at this time and was given his PRN dose of ativan. PT was then asked if he could go back in his room and sit down and PT complied.

## 2023-07-31 DIAGNOSIS — F03918 Unspecified dementia, unspecified severity, with other behavioral disturbance: Secondary | ICD-10-CM | POA: Insufficient documentation

## 2023-07-31 DIAGNOSIS — F988 Other specified behavioral and emotional disorders with onset usually occurring in childhood and adolescence: Secondary | ICD-10-CM | POA: Diagnosis not present

## 2023-07-31 MED ORDER — HALOPERIDOL 1 MG PO TABS
2.0000 mg | ORAL_TABLET | Freq: Once | ORAL | Status: AC
  Administered 2023-07-31: 2 mg via ORAL
  Filled 2023-07-31: qty 2

## 2023-07-31 NOTE — Consult Note (Cosign Needed)
Pt has remained without aggressive episode or behavioral disturbance for 72 hours. Pt has been compliant with medications. Pt has been easily verbally redirectable. Pt continues to deny SI/HI/AVH daily.  I spoke with his wife, Aztlan Billips, in length today.  Psychiatry has been searching for an inpatient unit for the patient since Friday.  Patient has been declined from every unit as well as the [place] primarily due to dementia diagnosis.  Explained to Okey Regal due to patient's dementia diagnosis, unable to cognitively participate in group, and his age which limits [place] to only geriatric psychiatric units, he has been declined everywhere we reached out to.  Explained to Okey Regal that while in the ED we have been adjusting his medications, and since increase in Depakote on 07/28/2023 there have been no behavioral or aggressive incidents.  Okey Regal stated she has been in contact with [facility], she has a brother-in-law that also stays there.  She has been working Engineer, site, Eunice Blase, and has already put a deposit down.  [facility] is wanting to interview the patient, however wanted to wait until patient was psychiatrically cleared. After further discussion with Okey Regal, and supervising physician Dr. Jannifer Franklin, I believe it would be in the patient's best interest to stop searching for an inpatient psychiatric unit and to focus on memory care unit placement. Pt has had successful mitigation of aggressive symptoms associated with dementia with medication adjustments. I do not believe patient would benefit further from a short inpatient psychiatric stay of 3-5 days, and would better benefit from more permanent placement.   I did explain to Okey Regal that with his dementia diagnosis and age, it is possible for patient to have agitated outbursts in the future. She is aware of this. Okey Regal has been visiting the patient daily, and she does feel like patient has improved since being in the ED. She is agreeable with psych  clearance at this time.   ED [facility] team notified of disposition and need for [facility] interview. TOC to take over to help assist with disposition.

## 2023-07-31 NOTE — Progress Notes (Signed)
Pt has been psych cleared per [facility], and TOC will now move forward with assisting with discharge planning. This CSW will sign off as care team member and ED TOC will follow.    Kelton Pillar, LCSWA 07/31/2023 @ 11:59 AM

## 2023-07-31 NOTE — ED Notes (Signed)
Pt becoming increasingly restless. Pacing in his room. Exited and came behind the nurse's station and was redirectable back to his room with some coercion. See MAR for prn administration

## 2023-07-31 NOTE — ED Provider Notes (Addendum)
Emergency Medicine Observation Re-evaluation Note  Julian [name] is a 89 y.o. male, seen on rounds today.  Pt initially presented to the ED for complaints of Altered Mental Status Currently, the patient is sleeping.  Physical Exam  BP (!) 177/88 (BP Location: Right Arm)   Pulse 82   Temp 98 F (36.7 C) (Oral)   Resp 16   SpO2 100%  Physical Exam General: Sleeping Cardiac: Extremities well-perfused Lungs: Breathing is unlabored Psych: Deferred  ED Course / MDM  EKG:   I have reviewed the labs performed to date as well as medications administered while in observation.  Denied admission to [facility].  He was site cleared today with plans for memory care unit placement.  Plan  Current plan is for placement.    Gloris Manchester, MD 07/31/23 1117    Gloris Manchester, MD 07/31/23 [phone]

## 2023-08-01 DIAGNOSIS — F988 Other specified behavioral and emotional disorders with onset usually occurring in childhood and adolescence: Secondary | ICD-10-CM | POA: Diagnosis not present

## 2023-08-01 NOTE — ED Notes (Signed)
Per sitter, pt agitated this morning, didn't know who she was, snatched bed covers away and stated, "No, I don't want any help" when trying to assist w/ making bed.

## 2023-08-01 NOTE — ED Notes (Signed)
Pt calm, cooperative, wandering in unit.

## 2023-08-01 NOTE — ED Notes (Signed)
Pt asked sitter how to "fill it up and leak it out" referring to filling his room w/ water. Sitter tried to reorient pt to his room and pt stated, "You can't see it; you're dumb". At this time pt remains mildly agitated.

## 2023-08-01 NOTE — ED Provider Notes (Signed)
Emergency Medicine Observation Re-evaluation Note  Bryxton Forshey is a 89 y.o. male, seen on rounds today.  Pt initially presented to the ED for complaints of Altered Mental Status Currently, the patient is sleeping.  Physical Exam  BP (!) 153/89 (BP Location: Left Arm)   Pulse 84   Temp 97.9 F (36.6 C)   Resp 18   SpO2 93%  Physical Exam General: Sleeping Cardiac: Extremities well-perfused Lungs: Breathing is unlabored Psych: Deferred  ED Course / MDM  EKG:   I have reviewed the labs performed to date as well as medications administered while in observation.  Denied admission to [facility].  He was site cleared today with plans for memory care unit placement.  Plan  Current plan is for placement.       Melene Plan, DO 08/01/23 [phone]

## 2023-08-01 NOTE — Progress Notes (Addendum)
2:44pm: CSW received call from patient's daughter Enid Derry who states [facility] is not an option for patient. Enid Derry requesting CSW send a list of memory care facilities to her email at [email or web address] - list was sent.  8:30am: CSW spoke with patient's daughter Enid Derry who states she has been speaking with Eunice Blase at [facility] regarding a possible admission. Enid Derry states patient will have to be interviewed by [facility] staff prior to a bed offer being made. Enid Derry states she will return call to CSW after she speaks with Eunice Blase this morning.  Edwin Dada, MSW, LCSW Transitions of Care  Clinical Social Worker II [phone]

## 2023-08-01 NOTE — ED Notes (Addendum)
Pt remains mildly agitated. Pt pacing in room, fidgeting w/ his socks, thinks his sitter is a Char[employer] fundraiser. Will continue to monitor pt behaviors.

## 2023-08-01 NOTE — ED Notes (Signed)
Person from SNF here to assess pt

## 2023-08-02 DIAGNOSIS — F988 Other specified behavioral and emotional disorders with onset usually occurring in childhood and adolescence: Secondary | ICD-10-CM | POA: Diagnosis not present

## 2023-08-02 MED ORDER — HALOPERIDOL LACTATE 5 MG/ML IJ SOLN
1.0000 mg | Freq: Once | INTRAMUSCULAR | Status: AC
  Administered 2023-08-02: 1 mg via INTRAMUSCULAR
  Filled 2023-08-02: qty 1

## 2023-08-02 NOTE — ED Notes (Signed)
Private sitter has left and new sitter will be arriving in about 15 minutes-Monique,RN

## 2023-08-02 NOTE — ED Provider Notes (Signed)
Emergency Medicine Observation Re-evaluation Note  Julian [name] is a 89 y.o. male, seen on rounds today.  Pt initially presented to the ED for complaints of Altered Mental Status Currently, the patient is sitting on edge of the bed.,  Cooperative.  Reports no complaints currently.  Physical Exam  BP (!) 146/76   Pulse 92   Temp 97.9 F (36.6 C) (Oral)   Resp 18   SpO2 100%  Physical Exam General: Well-appearing Cardiac: Normal rate Lungs: Normal work of breathing Psych: Calm and cooperative.  ED Course / MDM  EKG:   I have reviewed the labs performed to date as well as medications administered while in observation.  Recent changes in the last 24 hours include none.  Plan is for placement  Plan  Current plan is for placement.    Coral Spikes, DO 08/02/23 [phone]

## 2023-08-02 NOTE — ED Notes (Signed)
Pt started to escalate and wonder around the unit; Sitter is walking with patient but patient not wanting to go back to his room and continues to try to walk the unit and walking behind the RN station;patient unable to be redirected at this time; RN notified EDP for an agitation med-Monique,RN

## 2023-08-03 DIAGNOSIS — F988 Other specified behavioral and emotional disorders with onset usually occurring in childhood and adolescence: Secondary | ICD-10-CM | POA: Diagnosis not present

## 2023-08-03 NOTE — Progress Notes (Signed)
CSW spoke with patient's wife Enid Derry to obtain updates on placement efforts. Enid Derry reports speaking with [facility] and [facility] for possible admission. Enid Derry reports she is waiting for a return call from [facility] to discuss bed availability and pricing. Enid Derry states she is agreeable for CSW to initiate SNF work up in attempts to obtain LTC for patient with initial admission under his Medicare.  CSW will complete FL2 and fax patient's clinicals out for review.  Edwin Dada, MSW, LCSW Transitions of Care  Clinical Social Worker II [phone]

## 2023-08-03 NOTE — ED Provider Notes (Signed)
Emergency Medicine Observation Re-evaluation Note  Julian [name] is a 89 y.o. male, seen on rounds today.  Pt initially presented to the ED for complaints of Altered Mental Status Currently, the patient is eating breakfast.  Physical Exam  BP (!) 158/75   Pulse 97   Temp 98.5 F (36.9 C) (Oral)   Resp 19   SpO2 99%  Physical Exam General: no acute distress Lungs: normal effort Psych: no agitation  ED Course / MDM  EKG:   I have reviewed the labs performed to date as well as medications administered while in observation.  Recent changes in the last 24 hours include a dose of haldol yesterday.  Plan  Current plan is for placement.    Pricilla Loveless, MD 08/03/23 [phone]

## 2023-08-03 NOTE — ED Notes (Signed)
Pt sleeping at this time, private sitter remains at bedside.

## 2023-08-03 NOTE — NC FL2 (Addendum)
[place] MEDICAID FL2 LEVEL OF CARE FORM     IDENTIFICATION  Patient Name: Julian [name] Birthdate: 03-23-1926 Sex: male Admission Date (Current Location): 07/27/2023  [facility] and [place] Number:  Producer, television/film/video and Address:  [facility], [address], [place], [place] [address]      Provider Number: [id number]  Attending Physician Name and Address:  System, Provider Not In  Relative Name and Phone Number:       Current Level of Care: Hospital Recommended Level of Care: Memory Care Prior Approval Number:    Date Approved/Denied:   PASRR Number: [id number]  Discharge Plan: Domiciliary (Rest home) (Memory Care)    Current Diagnoses: Patient Active Problem List   Diagnosis Date Noted   Dementia with behavioral disturbance (HCC) 07/31/2023   Memory impairment 10/29/2022   RBBB 06/14/2018   Hyperlipidemia 01/26/2017   TIA (transient ischemic attack) 09/23/2016   Carotid artery disease (HCC) 10/27/2015   HTN (hypertension) 10/23/2014   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   Syncope 08/29/2014    Orientation RESPIRATION BLADDER Height & Weight     Self  Normal Continent Weight:  5\' 10"  Height:   148lbs, 12.8oz  BEHAVIORAL SYMPTOMS/MOOD NEUROLOGICAL BOWEL NUTRITION STATUS      Continent Dysphasia 3 diet  AMBULATORY STATUS COMMUNICATION OF NEEDS Skin   Supervision Verbally Normal                       Personal Care Assistance Level of Assistance  Bathing, Feeding, Dressing Bathing Assistance: Limited assistance Feeding assistance: Independent Dressing Assistance: Independent     Functional Limitations Info  Sight, Hearing, Speech Sight Info: Adequate Hearing Info: Adequate Speech Info: Adequate    SPECIAL CARE FACTORS FREQUENCY        Contractures Contractures Info: Not present    Additional Factors Info  Code Status, Allergies Code Status Info: Full Code Allergies Info: No known allergies            Current Medications (08/05/2023):  This is the current hospital active medication list Current Facility-Administered Medications  Medication Dose Route Frequency Provider Last Rate Last Admin   atorvastatin (LIPITOR) tablet 20 mg  20 mg Oral Daily Renne Crigler, PA-C   20 mg at 08/04/23 0933   citalopram (CELEXA) tablet 20 mg  20 mg Oral Daily Rondel Baton, MD   20 mg at 08/04/23 0933   divalproex (DEPAKOTE) DR tablet 250 mg  250 mg Oral BID Maryagnes Amos, FNP   250 mg at 08/04/23 2140   LORazepam (ATIVAN) tablet 0.5 mg  0.5 mg Oral Q12H PRN Carmel Sacramento A, PA-C   0.5 mg at 08/04/23 1502   losartan (COZAAR) tablet 100 mg  100 mg Oral Daily Renne Crigler, PA-C   100 mg at 08/04/23 0933   QUEtiapine (SEROQUEL) tablet 50 mg  50 mg Oral TID Rondel Baton, MD   50 mg at 08/04/23 2140   Current Outpatient Medications  Medication Sig Dispense Refill   atorvastatin (LIPITOR) 20 MG tablet Take 20 mg by mouth daily.     Cholecalciferol (VITAMIN D3) 125 MCG (5000 UT) CAPS Take 1 capsule by mouth daily.     divalproex (DEPAKOTE) 250 MG DR tablet Take 250 mg by mouth 2 (two) times daily.     fluticasone (FLONASE) 50 MCG/ACT nasal spray Place 2 sprays into both nostrils at bedtime.     haloperidol (HALDOL) 5 MG tablet Take 5 mg by  mouth every 8 (eight) hours. May also give 1 tablet by mouth every 4 hours as needed for agitation     LORazepam (ATIVAN) 0.5 MG tablet Take 0.5 mg by mouth 2 (two) times daily as needed (for agitation).     LORazepam (ATIVAN) 1 MG tablet Take 1 mg by mouth every 8 (eight) hours.     losartan (COZAAR) 100 MG tablet TAKE 1 TABLET BY MOUTH DAILY 90 tablet 3   Multiple Vitamin (MULTIVITAMIN) tablet Take 1 tablet by mouth daily. CENTRUM SILVER     OLANZapine (ZYPREXA) 2.5 MG tablet Take 2.5 mg by mouth in the morning and at bedtime.     Propylene Glycol (SYSTANE BALANCE) 0.6 % SOLN Place 1 drop into both eyes daily.     amLODipine (NORVASC) 2.5 MG tablet  TAKE 1 TABLET BY MOUTH DAILY (Patient not taking: Reported on 07/11/2023) 30 tablet 11   levothyroxine (SYNTHROID) 50 MCG tablet Take 50 mcg by mouth daily. (Patient not taking: Reported on 07/11/2023)       Discharge Medications: Please see discharge summary for a list of discharge medications.  Relevant Imaging Results:  Relevant Lab Results:   Additional Information [id number]  Inis Sizer, LCSW

## 2023-08-03 NOTE — ED Notes (Signed)
VOL  

## 2023-08-04 DIAGNOSIS — F988 Other specified behavioral and emotional disorders with onset usually occurring in childhood and adolescence: Secondary | ICD-10-CM | POA: Diagnosis not present

## 2023-08-04 MED ORDER — TUBERCULIN PPD 5 UNIT/0.1ML ID SOLN: 5.0000 [IU] | INTRADERMAL | Status: DC

## 2023-08-04 NOTE — ED Provider Notes (Signed)
Emergency Medicine Observation Re-evaluation Note  Julian [name] is a 89 y.o. male, seen on rounds today.  Pt initially presented to the ED for complaints of Altered Mental Status Currently, the patient is eating breakfast.  Physical Exam  BP (!) 159/89   Pulse 85   Temp 98.2 F (36.8 C)   Resp 15   SpO2 98%  Physical Exam General: no distress Lungs: normal effort Psych: no agitation  ED Course / MDM  EKG:   I have reviewed the labs performed to date as well as medications administered while in observation.  No recent changes in the last 24 hours.  Plan  Current plan is for placement.    Pricilla Loveless, MD 08/04/23 [phone]

## 2023-08-04 NOTE — Progress Notes (Addendum)
2pm: CSW spoke with patient's wife Enid Derry who states she spoke with [facility] and a bed offer has been made and patient can be accepted into the facility on Monday.  CSW attempted to reach Guss Bunde at [facility] without success - a voicemail was left requesting a return call.  CSW attempted to reach Elease Hashimoto at [facility] without success - a voicemail was left requesting a return call.  10am: CSW spoke with Revonda Standard of [facility] who states there is a memory care bed open at the facility and it has been offered to the patient.  CSW spoke with patient's wife Enid Derry to present her with information. Enid Derry states she will travel to the facility today to tour and will follow up with CSW after. CSW explained that this is the only bed offer and if it is not chosen, that she will have to take patient home as he cannot continue to board in the ED.  9am: CSW spoke with Belenda Cruise at [facility] to request she review patient for possible admission.  CSW expanded SNF search due to patient not having any bed offers at this time to include [place], [place], and [place].  Edwin Dada, MSW, LCSW Transitions of Care  Clinical Social Worker II [phone]

## 2023-08-04 NOTE — ED Notes (Signed)
Per pt sitter request, pt's spouse updated on room transfer.

## 2023-08-05 DIAGNOSIS — F988 Other specified behavioral and emotional disorders with onset usually occurring in childhood and adolescence: Secondary | ICD-10-CM | POA: Diagnosis not present

## 2023-08-05 MED ORDER — TUBERCULIN PPD 5 UNIT/0.1ML ID SOLN
5.0000 [IU] | INTRADERMAL | Status: AC
  Administered 2023-08-05: 5 [IU] via INTRADERMAL
  Filled 2023-08-05: qty 0.1

## 2023-08-05 NOTE — Progress Notes (Addendum)
2:40pm: CSW received request from [facility] requesting patient receive a COVID test prior to admitting to the facility.  CSW will speak with MD on Sunday to have test ordered so that results will be in before discharge on Monday. Elease Hashimoto states the facility will be able to accept patient on Monday morning between 9:30am and 10:30am.  10:30am: CSW sent signed forms to Elease Hashimoto at [facility] for review.  9:45am: CSW met with MD to have forms signed that are required for [facility]. MD placed order for TB skin test.  CSW spoke with RN who states she will place TB skin test shortly.  9am: CSW spoke with Elease Hashimoto at [facility] who states patient has been offered a bed and can admit hopefully on Monday. Elease Hashimoto requesting an updated FL2 and TB skin test be completed prior to admission. Elease Hashimoto states she will send CSW documentation that needs to be completed.  CSW spoke with Dr. Theresia Lo to inform her of requests.  Edwin Dada, MSW, LCSW Transitions of Care  Clinical Social Worker II [phone]

## 2023-08-05 NOTE — NC FL2 (Addendum)
[place] MEDICAID FL2 LEVEL OF CARE FORM     IDENTIFICATION  Patient Name: Julian [name] Birthdate: 08-08-1926 Sex: male Admission Date (Current Location): 07/27/2023  [facility] and [place] Number:  Producer, television/film/video and Address:  [facility] [place], [address], [place], [place] [address]      Provider Number: [id number]  Attending Physician Name and Address:  System, Provider Not In  Relative Name and Phone Number:       Current Level of Care: Hospital Recommended Level of Care: Memory Care Prior Approval Number:    Date Approved/Denied:   PASRR Number: [id number]  Discharge Plan: Domiciliary (Rest home) (Memory Care)    Current Diagnoses: Patient Active Problem List   Diagnosis Date Noted   Dementia with behavioral disturbance (HCC) 07/31/2023   Memory impairment 10/29/2022   RBBB 06/14/2018   Hyperlipidemia 01/26/2017   TIA (transient ischemic attack) 09/23/2016   Carotid artery disease (HCC) 10/27/2015   HTN (hypertension) 10/23/2014   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   Syncope 08/29/2014    Orientation RESPIRATION BLADDER Height & Weight     Self  Normal Continent Weight:  5\' 10"  Height:   148lbs, 12.8oz  BEHAVIORAL SYMPTOMS/MOOD NEUROLOGICAL BOWEL NUTRITION STATUS      Continent Regular  AMBULATORY STATUS COMMUNICATION OF NEEDS Skin   Supervision Verbally Normal                       Personal Care Assistance Level of Assistance  Bathing, Feeding, Dressing Bathing Assistance: Limited assistance Feeding assistance: Independent Dressing Assistance: Independent     Functional Limitations Info  Sight, Hearing, Speech Sight Info: Adequate Hearing Info: Adequate Speech Info: Adequate    SPECIAL CARE FACTORS FREQUENCY        Contractures Contractures Info: Not present    Additional Factors Info  Code Status, Allergies Code Status Info: Full Code Allergies Info: No known allergies           Current  Medications (08/05/2023):  This is the current hospital active medication list Current Facility-Administered Medications  Medication Dose Route Frequency Provider Last Rate Last Admin   atorvastatin (LIPITOR) tablet 20 mg  20 mg Oral Daily Renne Crigler, PA-C   20 mg at 08/04/23 0933   citalopram (CELEXA) tablet 20 mg  20 mg Oral Daily Rondel Baton, MD   20 mg at 08/04/23 0933   divalproex (DEPAKOTE) DR tablet 250 mg  250 mg Oral BID Maryagnes Amos, FNP   250 mg at 08/04/23 2140   LORazepam (ATIVAN) tablet 0.5 mg  0.5 mg Oral Q12H PRN Carmel Sacramento A, PA-C   0.5 mg at 08/04/23 1502   losartan (COZAAR) tablet 100 mg  100 mg Oral Daily Renne Crigler, PA-C   100 mg at 08/04/23 0933   QUEtiapine (SEROQUEL) tablet 50 mg  50 mg Oral TID Rondel Baton, MD   50 mg at 08/04/23 2140   tuberculin injection 5 Units  5 Units Intradermal STAT Rexford Maus, DO       Current Outpatient Medications  Medication Sig Dispense Refill   atorvastatin (LIPITOR) 20 MG tablet Take 20 mg by mouth daily.     Cholecalciferol (VITAMIN D3) 125 MCG (5000 UT) CAPS Take 1 capsule by mouth daily.     divalproex (DEPAKOTE) 250 MG DR tablet Take 250 mg by mouth 2 (two) times daily.     fluticasone (FLONASE) 50 MCG/ACT nasal spray Place 2 sprays into  both nostrils at bedtime.     haloperidol (HALDOL) 5 MG tablet Take 5 mg by mouth every 8 (eight) hours. May also give 1 tablet by mouth every 4 hours as needed for agitation     LORazepam (ATIVAN) 0.5 MG tablet Take 0.5 mg by mouth 2 (two) times daily as needed (for agitation).     LORazepam (ATIVAN) 1 MG tablet Take 1 mg by mouth every 8 (eight) hours.     losartan (COZAAR) 100 MG tablet TAKE 1 TABLET BY MOUTH DAILY 90 tablet 3   Multiple Vitamin (MULTIVITAMIN) tablet Take 1 tablet by mouth daily. CENTRUM SILVER     OLANZapine (ZYPREXA) 2.5 MG tablet Take 2.5 mg by mouth in the morning and at bedtime.     Propylene Glycol (SYSTANE BALANCE) 0.6 % SOLN  Place 1 drop into both eyes daily.     amLODipine (NORVASC) 2.5 MG tablet TAKE 1 TABLET BY MOUTH DAILY (Patient not taking: Reported on 07/11/2023) 30 tablet 11   levothyroxine (SYNTHROID) 50 MCG tablet Take 50 mcg by mouth daily. (Patient not taking: Reported on 07/11/2023)       Discharge Medications: Please see discharge summary for a list of discharge medications.  Relevant Imaging Results:  Relevant Lab Results:   Additional Information [id number]  Inis Sizer, LCSW

## 2023-08-05 NOTE — ED Notes (Signed)
Sitter at bedside , assisted patient up to bathroom x 2, stretcher linens changed pillow given and warm blankets. No complaints from patient.

## 2023-08-06 DIAGNOSIS — F988 Other specified behavioral and emotional disorders with onset usually occurring in childhood and adolescence: Secondary | ICD-10-CM | POA: Diagnosis not present

## 2023-08-06 LAB — RESP PANEL BY RT-PCR (RSV, FLU A&B, COVID)  RVPGX2
Influenza A by PCR: NEGATIVE
Influenza B by PCR: NEGATIVE
Resp Syncytial Virus by PCR: NEGATIVE
SARS Coronavirus 2 by RT PCR: NEGATIVE

## 2023-08-06 NOTE — ED Provider Notes (Signed)
Emergency Medicine Observation Re-evaluation Note  Slater Gettler is a 89 y.o. male, seen on rounds today.  Pt initially presented to the ED for complaints of Altered Mental Status Currently, the patient is resting in bed.  Physical Exam  BP 136/86 (BP Location: Left Arm)   Pulse 70   Temp 97.7 F (36.5 C) (Oral)   Resp 16   SpO2 93%  Physical Exam General: no distress Psych: no agitation  ED Course / MDM  EKG:   I have reviewed the labs performed to date as well as medications administered while in observation.  No recent changes in the last 24 hours.  Plan  Current plan is for placement.       Ernie Avena, MD 08/06/23 1154

## 2023-08-06 NOTE — ED Notes (Signed)
Patient awake and alert this morning, ambulated to the restroom with assistance from a private sitter, wife at bedside, will continue to monitor.

## 2023-08-07 DIAGNOSIS — F988 Other specified behavioral and emotional disorders with onset usually occurring in childhood and adolescence: Secondary | ICD-10-CM | POA: Diagnosis not present

## 2023-08-07 NOTE — ED Notes (Signed)
PPD test negative, no induration noted.

## 2023-08-07 NOTE — ED Provider Notes (Signed)
Emergency Medicine Observation Re-evaluation Note  Julian [name] is a 89 y.o. male, seen on rounds today.  Pt initially presented to the ED for complaints of Altered Mental Status Currently, the patient is resting in bed.  Physical Exam  BP 137/81 (BP Location: Left Arm)   Pulse 79   Temp 98.8 F (37.1 C) (Oral)   Resp 18   SpO2 96%  Physical Exam General: no distress Psych: no agitation  ED Course / MDM  EKG:   I have reviewed the labs performed to date as well as medications administered while in observation.  No recent changes in the last 24 hours.  Plan  Current plan is for placement.         Ernie Avena, MD 08/07/23 1150

## 2023-08-07 NOTE — ED Notes (Signed)
VOL at this time

## 2023-08-07 NOTE — ED Notes (Signed)
Pt agitated, was told to come back later to get vitals.

## 2023-08-07 NOTE — Progress Notes (Signed)
CSW sent PPD results to Elease Hashimoto at [facility] for review.  CSW spoke with MD to request a COVID test be ordered to result before tomorrow morning.  Edwin Dada, MSW, LCSW Transitions of Care  Clinical Social Worker II [phone]

## 2023-08-08 DIAGNOSIS — F988 Other specified behavioral and emotional disorders with onset usually occurring in childhood and adolescence: Secondary | ICD-10-CM | POA: Diagnosis not present

## 2023-08-08 NOTE — ED Notes (Signed)
Patient awake and alert, no s/s of distress, ambulated to the restroom, private sitter present in the room.

## 2023-08-08 NOTE — Discharge Instructions (Signed)
It was a pleasure caring for you today in the emergency department. ° °Please return to the emergency department for any worsening or worrisome symptoms. ° ° °

## 2023-08-08 NOTE — ED Provider Notes (Signed)
Emergency Medicine Observation Re-evaluation Note  Yohanan Ferroni is a 89 y.o. male, seen on rounds today.  Pt initially presented to the ED for complaints of Altered Mental Status Currently, the patient is resting.  Physical Exam  BP (!) 173/111 (BP Location: Right Arm)   Pulse 79   Temp 98.4 F (36.9 C) (Oral)   Resp 17   SpO2 94%  Physical Exam General: nad Lungs: no resp distress Psych: calm   ED Course / MDM  EKG:   I have reviewed the labs performed to date as well as medications administered while in observation.  Recent changes in the last 24 hours include intermittently agitated, variable compliance with home medications and p.o..  No acute changes.  Plan  Current plan is for placement.    Sloan Leiter, DO 08/08/23 [phone]

## 2023-08-08 NOTE — Progress Notes (Addendum)
Patient will go to [facility] memory care today. The number to call for report is [phone], please ask for supervisor of [facility] to give report.  CSW spoke with patient's wife Enid Derry who states she will transport patient to the facility around 11:30am.  CSW informed RN and MD of plan.  Edwin Dada, MSW, LCSW Transitions of Care  Clinical Social Worker II [phone]

## 2023-08-08 NOTE — ED Notes (Signed)
2nd attempt to speak to the supervisor, no answer, left a message.

## 2023-08-08 NOTE — ED Notes (Signed)
Left a message for supervisor at [facility] Room 1.

## 2023-08-08 NOTE — ED Notes (Signed)
Pt ambulated with personal care tech to the restroom and back, steady gait observed with x1 assist personal staff. Pt has slept most of the shift, did not eat dinner, and non-compliant with medications and oral temperature tonight. Otherwise pt very easy to verbally redirect.

## 2023-08-24 DIAGNOSIS — F02B0 Dementia in other diseases classified elsewhere, moderate, without behavioral disturbance, psychotic disturbance, mood disturbance, and anxiety: Secondary | ICD-10-CM | POA: Diagnosis not present

## 2023-08-31 DIAGNOSIS — L720 Epidermal cyst: Secondary | ICD-10-CM | POA: Diagnosis not present

## 2023-08-31 DIAGNOSIS — D485 Neoplasm of uncertain behavior of skin: Secondary | ICD-10-CM | POA: Diagnosis not present

## 2023-08-31 DIAGNOSIS — C4442 Squamous cell carcinoma of skin of scalp and neck: Secondary | ICD-10-CM | POA: Diagnosis not present

## 2023-09-12 ENCOUNTER — Emergency Department (HOSPITAL_COMMUNITY): Payer: Medicare Other

## 2023-09-12 ENCOUNTER — Emergency Department (HOSPITAL_COMMUNITY)
Admission: EM | Admit: 2023-09-12 | Discharge: 2023-09-12 | Disposition: A | Discharge status: Home / Self Care | Payer: Medicare Other | Attending: Emergency Medicine | Admitting: Emergency Medicine

## 2023-09-12 DIAGNOSIS — F0390 Unspecified dementia without behavioral disturbance: Secondary | ICD-10-CM | POA: Insufficient documentation

## 2023-09-12 DIAGNOSIS — R29818 Other symptoms and signs involving the nervous system: Secondary | ICD-10-CM | POA: Diagnosis not present

## 2023-09-12 DIAGNOSIS — R410 Disorientation, unspecified: Secondary | ICD-10-CM | POA: Diagnosis not present

## 2023-09-12 DIAGNOSIS — R4182 Altered mental status, unspecified: Secondary | ICD-10-CM | POA: Diagnosis not present

## 2023-09-12 DIAGNOSIS — R0602 Shortness of breath: Secondary | ICD-10-CM | POA: Diagnosis not present

## 2023-09-12 DIAGNOSIS — R0902 Hypoxemia: Secondary | ICD-10-CM | POA: Diagnosis not present

## 2023-09-12 DIAGNOSIS — R404 Transient alteration of awareness: Secondary | ICD-10-CM | POA: Diagnosis not present

## 2023-09-12 DIAGNOSIS — R0989 Other specified symptoms and signs involving the circulatory and respiratory systems: Secondary | ICD-10-CM | POA: Diagnosis not present

## 2023-09-12 DIAGNOSIS — R001 Bradycardia, unspecified: Secondary | ICD-10-CM | POA: Diagnosis not present

## 2023-09-12 DIAGNOSIS — I491 Atrial premature depolarization: Secondary | ICD-10-CM | POA: Diagnosis not present

## 2023-09-12 LAB — CBC WITH DIFFERENTIAL/PLATELET
Abs Immature Granulocytes: 0.01 10*3/uL (ref 0.00–0.07)
Basophils Absolute: 0 10*3/uL (ref 0.0–0.1)
Basophils Relative: 1 %
Eosinophils Absolute: 0.2 10*3/uL (ref 0.0–0.5)
Eosinophils Relative: 4 %
HCT: 34.7 % — ABNORMAL LOW (ref 39.0–52.0)
Hemoglobin: 10.8 g/dL — ABNORMAL LOW (ref 13.0–17.0)
Immature Granulocytes: 0 %
Lymphocytes Relative: 19 %
Lymphs Abs: 1.1 10*3/uL (ref 0.7–4.0)
MCH: 28.4 pg (ref 26.0–34.0)
MCHC: 31.1 g/dL (ref 30.0–36.0)
MCV: 91.3 fL (ref 80.0–100.0)
Monocytes Absolute: 0.5 10*3/uL (ref 0.1–1.0)
Monocytes Relative: 9 %
Neutro Abs: 4 10*3/uL (ref 1.7–7.7)
Neutrophils Relative %: 67 %
Platelets: 184 10*3/uL (ref 150–400)
RBC: 3.8 MIL/uL — ABNORMAL LOW (ref 4.22–5.81)
RDW: 14.6 % (ref 11.5–15.5)
WBC: 6 10*3/uL (ref 4.0–10.5)
nRBC: 0 % (ref 0.0–0.2)

## 2023-09-12 LAB — URINALYSIS, W/ REFLEX TO CULTURE (INFECTION SUSPECTED)
Bacteria, UA: NONE SEEN
Bilirubin Urine: NEGATIVE
Glucose, UA: NEGATIVE mg/dL
Hgb urine dipstick: NEGATIVE
Ketones, ur: NEGATIVE mg/dL
Leukocytes,Ua: NEGATIVE
Nitrite: NEGATIVE
Protein, ur: NEGATIVE mg/dL
Specific Gravity, Urine: 1.01 (ref 1.005–1.030)
pH: 7 (ref 5.0–8.0)

## 2023-09-12 LAB — COMPREHENSIVE METABOLIC PANEL
ALT: 16 U/L (ref 0–44)
AST: 18 U/L (ref 15–41)
Albumin: 2.8 g/dL — ABNORMAL LOW (ref 3.5–5.0)
Alkaline Phosphatase: 55 U/L (ref 38–126)
Anion gap: 8 (ref 5–15)
BUN: 22 mg/dL (ref 8–23)
CO2: 27 mmol/L (ref 22–32)
Calcium: 8.7 mg/dL — ABNORMAL LOW (ref 8.9–10.3)
Chloride: 103 mmol/L (ref 98–111)
Creatinine, Ser: 1.06 mg/dL (ref 0.61–1.24)
GFR, Estimated: >60 mL/min (ref >60)
Glucose, Bld: 94 mg/dL (ref 70–99)
Potassium: 4.9 mmol/L (ref 3.5–5.1)
Sodium: 138 mmol/L (ref 135–145)
Total Bilirubin: 0.8 mg/dL (ref 0.0–1.2)
Total Protein: 5.8 g/dL — ABNORMAL LOW (ref 6.5–8.1)

## 2023-09-12 LAB — VALPROIC ACID LEVEL: Valproic Acid Lvl: 27 ug/mL — ABNORMAL LOW (ref 50.0–100.0)

## 2023-09-12 MED ORDER — LORAZEPAM 1 MG PO TABS
0.5000 mg | ORAL_TABLET | Freq: Once | ORAL | Status: AC
  Administered 2023-09-12: 0.5 mg via ORAL
  Filled 2023-09-12: qty 1

## 2023-09-12 NOTE — ED Notes (Signed)
RN provided pt snack and beverage. Pt spouse assisting with food.

## 2023-09-12 NOTE — ED Notes (Signed)
During report RN informed me that pt spouse has had a bad experience with [facility]. She would like to know which medications are provided beforehand.

## 2023-09-12 NOTE — ED Triage Notes (Signed)
Coming From [facility], [place] was called due lethargy, last seen well 01/19 2100.  For EMS palpated pulse was in 30's with high count of PVC's. On arrival patient is oriented to self, HR is irregular, denies pain.   HX of dementia.

## 2023-09-12 NOTE — ED Provider Notes (Addendum)
[place] EMERGENCY DEPARTMENT AT [facility] Provider Note   CSN: [id number] Arrival date & time: 09/12/23  1041     History  Chief Complaint  Patient presents with   Altered Mental Status    Julian [name] is a 89 y.o. male.  89 year old male with history of dementia presents due to increasing lethargy.  Patient lives in a memory care unit and was found to not be as responsive.  No reported history of falls, vomiting, fever, diarrhea.  EMS was called and they palpable pulse was in the 30s with lots of PVCs.  He denies any urinary symptoms.  Center for further eval.       Home Medications Prior to Admission medications   Medication Sig Start Date End Date Taking? Authorizing Provider  amLODipine (NORVASC) 2.5 MG tablet TAKE 1 TABLET BY MOUTH DAILY Patient not taking: Reported on 07/11/2023 01/10/23   Nahser, Deloris Ping, MD  atorvastatin (LIPITOR) 20 MG tablet Take 20 mg by mouth daily. 07/07/23   [provider]  Cholecalciferol (VITAMIN D3) 125 MCG (5000 UT) CAPS Take 1 capsule by mouth daily.    [provider]  divalproex (DEPAKOTE) 250 MG DR tablet Take 250 mg by mouth 2 (two) times daily. 06/06/23   [provider]  fluticasone (FLONASE) 50 MCG/ACT nasal spray Place 2 sprays into both nostrils at bedtime.    [provider]  haloperidol (HALDOL) 5 MG tablet Take 5 mg by mouth every 8 (eight) hours. May also give 1 tablet by mouth every 4 hours as needed for agitation 07/20/23   [provider]  levothyroxine (SYNTHROID) 50 MCG tablet Take 50 mcg by mouth daily. Patient not taking: Reported on 07/11/2023    [provider]  LORazepam (ATIVAN) 0.5 MG tablet Take 0.5 mg by mouth 2 (two) times daily as needed (for agitation). 07/20/23   [provider]  LORazepam (ATIVAN) 1 MG tablet Take 1 mg by mouth every 8 (eight) hours. 04/06/23   [provider]  losartan (COZAAR) 100 MG tablet TAKE 1 TABLET BY  MOUTH DAILY 12/16/22   Nahser, Deloris Ping, MD  Multiple Vitamin (MULTIVITAMIN) tablet Take 1 tablet by mouth daily. CENTRUM SILVER    [provider]  OLANZapine (ZYPREXA) 2.5 MG tablet Take 2.5 mg by mouth in the morning and at bedtime. 07/24/23   [provider]  Propylene Glycol (SYSTANE BALANCE) 0.6 % SOLN Place 1 drop into both eyes daily.    [provider]      Allergies    Patient has no known allergies.    Review of Systems   Review of Systems  Unable to perform ROS: Acuity of condition    Physical Exam Updated Vital Signs BP (!) 161/67 (BP Location: Right Arm)   Pulse 66   Temp 97.9 F (36.6 C) (Oral)   Resp 16   SpO2 100%  Physical Exam Vitals and nursing note reviewed.  Constitutional:      General: He is not in acute distress.    Appearance: Normal appearance. He is well-developed. He is not toxic-appearing.  HENT:     Head: Normocephalic and atraumatic.  Eyes:     General: Lids are normal.     Conjunctiva/sclera: Conjunctivae normal.     Pupils: Pupils are equal, round, and reactive to light.  Neck:     Thyroid: No thyroid mass.     Trachea: No tracheal deviation.  Cardiovascular:     Rate and Rhythm:  Normal rate and regular rhythm.     Heart sounds: Normal heart sounds. No murmur heard.    No gallop.  Pulmonary:     Effort: Pulmonary effort is normal. No respiratory distress.     Breath sounds: Normal breath sounds. No stridor. No decreased breath sounds, wheezing, rhonchi or rales.  Abdominal:     General: There is no distension.     Palpations: Abdomen is soft.     Tenderness: There is no abdominal tenderness. There is no rebound.  Musculoskeletal:        General: No tenderness. Normal range of motion.     Cervical back: Normal range of motion and neck supple.  Skin:    General: Skin is warm and dry.     Findings: No abrasion or rash.  Neurological:     Mental Status: He is oriented to person, place, and time. Mental status  is at baseline. He is lethargic.     GCS: GCS eye subscore is 4. GCS verbal subscore is 5. GCS motor subscore is 6.     Cranial Nerves: No cranial nerve deficit.     Sensory: No sensory deficit.     Motor: Motor function is intact.     Coordination: Finger-Nose-Finger Test normal.     Comments: Strength 5 of 5 in upper as well as lower extremities  Psychiatric:        Attention and Perception: Attention normal.        Mood and Affect: Affect is flat.        Speech: Speech is delayed.     ED Results / Procedures / Treatments   Labs (all labs ordered are listed, but only abnormal results are displayed) Labs Reviewed  URINALYSIS, W/ REFLEX TO CULTURE (INFECTION SUSPECTED)  CBC WITH DIFFERENTIAL/PLATELET  COMPREHENSIVE METABOLIC PANEL  VALPROIC ACID LEVEL    EKG EKG Interpretation Date/Time:  Monday September 12 2023 11:05:00 EST Ventricular Rate:  75 PR Interval:  218 QRS Duration:  148 QT Interval:  437 QTC Calculation: 415 R Axis:   63  Text Interpretation: Sinus rhythm Ventricular bigeminy Borderline prolonged PR interval Right bundle branch block Confirmed by Lorre Nick ([id number]) on 09/12/2023 11:11:37 AM  Radiology No results found.  Procedures Procedures    Medications Ordered in ED Medications - No data to display  ED Course/ Medical Decision Making/ A&P                                 Medical Decision Making Amount and/or Complexity of Data Reviewed Labs: ordered. Radiology: ordered.  Risk Prescription drug management.   Patient's chest x-ray without acute findings here.  Head CT also without acute findings.  Urinalysis negative.  Given a schedule dose of Ativan.  Family here at bedside and state patient at baseline.  Patient's Depakote level is not supratherapeutic.  Discharge home      Final Clinical Impression(s) / ED Diagnoses Final diagnoses:  None    Rx / DC Orders ED Discharge Orders     None         Lorre Nick,  MD 09/12/23 1511    Lorre Nick, MD 09/12/23 1515

## 2023-09-12 NOTE — ED Notes (Signed)
Checked patient rectal temp it was 96.8 got patient some warm blankets and some socks patient is resting with family at bedside AND CALL BE[facility] IN RE[facility]

## 2023-09-20 DIAGNOSIS — F02C11 Dementia in other diseases classified elsewhere, severe, with agitation: Secondary | ICD-10-CM | POA: Diagnosis not present

## 2023-09-20 DIAGNOSIS — I493 Ventricular premature depolarization: Secondary | ICD-10-CM | POA: Diagnosis not present

## 2023-09-20 DIAGNOSIS — G301 Alzheimer's disease with late onset: Secondary | ICD-10-CM | POA: Diagnosis not present

## 2023-09-20 DIAGNOSIS — G319 Degenerative disease of nervous system, unspecified: Secondary | ICD-10-CM | POA: Diagnosis not present

## 2023-09-20 DIAGNOSIS — R195 Other fecal abnormalities: Secondary | ICD-10-CM | POA: Diagnosis not present

## 2023-09-20 DIAGNOSIS — C760 Malignant neoplasm of head, face and neck: Secondary | ICD-10-CM | POA: Diagnosis not present

## 2023-09-23 DIAGNOSIS — F03B3 Unspecified dementia, moderate, with mood disturbance: Secondary | ICD-10-CM | POA: Diagnosis not present

## 2023-09-23 DIAGNOSIS — Z515 Encounter for palliative care: Secondary | ICD-10-CM | POA: Diagnosis not present

## 2023-09-24 DIAGNOSIS — F02B0 Dementia in other diseases classified elsewhere, moderate, without behavioral disturbance, psychotic disturbance, mood disturbance, and anxiety: Secondary | ICD-10-CM | POA: Diagnosis not present

## 2023-10-06 DIAGNOSIS — R410 Disorientation, unspecified: Secondary | ICD-10-CM | POA: Diagnosis not present

## 2023-10-06 DIAGNOSIS — M2012 Hallux valgus (acquired), left foot: Secondary | ICD-10-CM | POA: Diagnosis not present

## 2023-10-06 DIAGNOSIS — B351 Tinea unguium: Secondary | ICD-10-CM | POA: Diagnosis not present

## 2023-10-09 ENCOUNTER — Emergency Department (HOSPITAL_COMMUNITY)
Admission: EM | Admit: 2023-10-09 | Discharge: 2023-10-10 | Disposition: A | Discharge status: Home / Self Care | Payer: Medicare Other | Attending: Emergency Medicine | Admitting: Emergency Medicine

## 2023-10-09 ENCOUNTER — Encounter (HOSPITAL_COMMUNITY): Payer: Self-pay | Admitting: Emergency Medicine

## 2023-10-09 ENCOUNTER — Emergency Department (HOSPITAL_COMMUNITY): Payer: Medicare Other

## 2023-10-09 DIAGNOSIS — F0280 Dementia in other diseases classified elsewhere without behavioral disturbance: Secondary | ICD-10-CM | POA: Diagnosis not present

## 2023-10-09 DIAGNOSIS — R509 Fever, unspecified: Secondary | ICD-10-CM | POA: Insufficient documentation

## 2023-10-09 DIAGNOSIS — R0689 Other abnormalities of breathing: Secondary | ICD-10-CM | POA: Diagnosis not present

## 2023-10-09 DIAGNOSIS — G309 Alzheimer's disease, unspecified: Secondary | ICD-10-CM | POA: Insufficient documentation

## 2023-10-09 DIAGNOSIS — Z8546 Personal history of malignant neoplasm of prostate: Secondary | ICD-10-CM | POA: Insufficient documentation

## 2023-10-09 DIAGNOSIS — Z79899 Other long term (current) drug therapy: Secondary | ICD-10-CM | POA: Insufficient documentation

## 2023-10-09 DIAGNOSIS — I959 Hypotension, unspecified: Secondary | ICD-10-CM | POA: Diagnosis not present

## 2023-10-09 DIAGNOSIS — J189 Pneumonia, unspecified organism: Secondary | ICD-10-CM | POA: Diagnosis not present

## 2023-10-09 DIAGNOSIS — R404 Transient alteration of awareness: Secondary | ICD-10-CM | POA: Diagnosis not present

## 2023-10-09 DIAGNOSIS — J984 Other disorders of lung: Secondary | ICD-10-CM | POA: Diagnosis not present

## 2023-10-09 DIAGNOSIS — Z8673 Personal history of transient ischemic attack (TIA), and cerebral infarction without residual deficits: Secondary | ICD-10-CM | POA: Insufficient documentation

## 2023-10-09 DIAGNOSIS — I10 Essential (primary) hypertension: Secondary | ICD-10-CM | POA: Insufficient documentation

## 2023-10-09 DIAGNOSIS — R918 Other nonspecific abnormal finding of lung field: Secondary | ICD-10-CM | POA: Diagnosis not present

## 2023-10-09 LAB — CBC WITH DIFFERENTIAL/PLATELET
Abs Immature Granulocytes: 0.03 10*3/uL (ref 0.00–0.07)
Basophils Absolute: 0 10*3/uL (ref 0.0–0.1)
Basophils Relative: 0 %
Eosinophils Absolute: 0 10*3/uL (ref 0.0–0.5)
Eosinophils Relative: 0 %
HCT: 33.7 % — ABNORMAL LOW (ref 39.0–52.0)
Hemoglobin: 10.6 g/dL — ABNORMAL LOW (ref 13.0–17.0)
Immature Granulocytes: 0 %
Lymphocytes Relative: 8 %
Lymphs Abs: 0.8 10*3/uL (ref 0.7–4.0)
MCH: 28.6 pg (ref 26.0–34.0)
MCHC: 31.5 g/dL (ref 30.0–36.0)
MCV: 90.8 fL (ref 80.0–100.0)
Monocytes Absolute: 0.8 10*3/uL (ref 0.1–1.0)
Monocytes Relative: 8 %
Neutro Abs: 8.9 10*3/uL — ABNORMAL HIGH (ref 1.7–7.7)
Neutrophils Relative %: 84 %
Platelets: 182 10*3/uL (ref 150–400)
RBC: 3.71 MIL/uL — ABNORMAL LOW (ref 4.22–5.81)
RDW: 14.9 % (ref 11.5–15.5)
WBC: 10.6 10*3/uL — ABNORMAL HIGH (ref 4.0–10.5)
nRBC: 0 % (ref 0.0–0.2)

## 2023-10-09 LAB — URINALYSIS, W/ REFLEX TO CULTURE (INFECTION SUSPECTED)
Bacteria, UA: NONE SEEN
Bilirubin Urine: NEGATIVE
Glucose, UA: NEGATIVE mg/dL
Hgb urine dipstick: NEGATIVE
Ketones, ur: 5 mg/dL — AB
Leukocytes,Ua: NEGATIVE
Nitrite: NEGATIVE
Protein, ur: NEGATIVE mg/dL
Specific Gravity, Urine: 1.019 (ref 1.005–1.030)
pH: 5 (ref 5.0–8.0)

## 2023-10-09 MED ORDER — SODIUM CHLORIDE 0.9 % IV BOLUS
1000.0000 mL | Freq: Once | INTRAVENOUS | Status: AC
  Administered 2023-10-09: 1000 mL via INTRAVENOUS

## 2023-10-09 NOTE — ED Triage Notes (Signed)
Pt arrives via EMS from [facility] facility for fever. EMS report facility giving 650mg  tylenol at approx 2100. Pt Aox1 at baseline. NS given en route with EMS.

## 2023-10-10 ENCOUNTER — Other Ambulatory Visit: Payer: Self-pay

## 2023-10-10 ENCOUNTER — Telehealth (HOSPITAL_COMMUNITY): Payer: Self-pay | Admitting: Emergency Medicine

## 2023-10-10 DIAGNOSIS — Z7401 Bed confinement status: Secondary | ICD-10-CM | POA: Diagnosis not present

## 2023-10-10 DIAGNOSIS — I959 Hypotension, unspecified: Secondary | ICD-10-CM | POA: Diagnosis not present

## 2023-10-10 DIAGNOSIS — R509 Fever, unspecified: Secondary | ICD-10-CM | POA: Diagnosis not present

## 2023-10-10 DIAGNOSIS — R918 Other nonspecific abnormal finding of lung field: Secondary | ICD-10-CM | POA: Diagnosis not present

## 2023-10-10 DIAGNOSIS — J984 Other disorders of lung: Secondary | ICD-10-CM | POA: Diagnosis not present

## 2023-10-10 DIAGNOSIS — J189 Pneumonia, unspecified organism: Secondary | ICD-10-CM | POA: Diagnosis not present

## 2023-10-10 LAB — COMPREHENSIVE METABOLIC PANEL
ALT: 17 U/L (ref 0–44)
AST: 19 U/L (ref 15–41)
Albumin: 2.9 g/dL — ABNORMAL LOW (ref 3.5–5.0)
Alkaline Phosphatase: 55 U/L (ref 38–126)
Anion gap: 7 (ref 5–15)
BUN: 28 mg/dL — ABNORMAL HIGH (ref 8–23)
CO2: 25 mmol/L (ref 22–32)
Calcium: 8.3 mg/dL — ABNORMAL LOW (ref 8.9–10.3)
Chloride: 104 mmol/L (ref 98–111)
Creatinine, Ser: 0.9 mg/dL (ref 0.61–1.24)
GFR, Estimated: >60 mL/min (ref >60)
Glucose, Bld: 99 mg/dL (ref 70–99)
Potassium: 3.9 mmol/L (ref 3.5–5.1)
Sodium: 136 mmol/L (ref 135–145)
Total Bilirubin: 0.6 mg/dL (ref 0.0–1.2)
Total Protein: 6 g/dL — ABNORMAL LOW (ref 6.5–8.1)

## 2023-10-10 LAB — APTT: aPTT: 43 s — ABNORMAL HIGH (ref 24–36)

## 2023-10-10 LAB — RESP PANEL BY RT-PCR (RSV, FLU A&B, COVID)  RVPGX2
Influenza A by PCR: NEGATIVE
Influenza B by PCR: NEGATIVE
Resp Syncytial Virus by PCR: NEGATIVE
SARS Coronavirus 2 by RT PCR: NEGATIVE

## 2023-10-10 LAB — PROTIME-INR
INR: 1.2 (ref 0.8–1.2)
Prothrombin Time: 15.5 s — ABNORMAL HIGH (ref 11.4–15.2)

## 2023-10-10 LAB — LACTIC ACID, PLASMA: Lactic Acid, Venous: 0.7 mmol/L (ref 0.5–1.9)

## 2023-10-10 MED ORDER — AMOXICILLIN-POT CLAVULANATE 400-57 MG/5ML PO SUSR: 800.0000 mg | Freq: Two times a day (BID) | ORAL | 0 refills | Status: DC

## 2023-10-10 MED ORDER — AZITHROMYCIN 100 MG/5ML PO SUSR: ORAL | 0 refills | Status: DC

## 2023-10-10 MED ORDER — AMOXICILLIN-POT CLAVULANATE 400-57 MG/5ML PO SUSR: 800.0000 mg | Freq: Two times a day (BID) | ORAL | 0 refills | Status: AC

## 2023-10-10 MED ORDER — SODIUM CHLORIDE 0.9 % IV SOLN
1.0000 g | Freq: Once | INTRAVENOUS | Status: AC
  Administered 2023-10-10: 1 g via INTRAVENOUS
  Filled 2023-10-10: qty 10

## 2023-10-10 NOTE — Telephone Encounter (Signed)
Changing antibiotic to go to different pharm

## 2023-10-10 NOTE — ED Notes (Signed)
10/10/23 0730 opened chart to request prescriptions be sent to a pharmacy that is open. After discussion meds were sent to [facility] on [address] then pts wife notified.

## 2023-10-10 NOTE — ED Provider Notes (Signed)
[place] EMERGENCY DEPARTMENT AT [facility] Provider Note  CSN: [id number] Arrival date & time: 10/09/23 2257  Chief Complaint(s) Fever Pt arrives via EMS from [facility] facility for fever. EMS report facility giving 650mg  tylenol at approx 2100. Pt Aox1 at baseline. NS given en route with EMS.   HPI Julian [name] is a 89 y.o. male with a past medical history listed below including Alzheimer's dementia here from memory unit skilled nursing facility for fever.  He is accompanied by the wife who reports patient has been coughing.  No vomiting or diarrhea.  No other physical complaints.  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 Authorizing Provider  amoxicillin-clavulanate (AUGMENTIN) 400-57 MG/5ML suspension Take 10 mLs (800 mg total) by mouth 2 (two) times daily for 7 days. 10/10/23 10/17/23 Yes Dilara Navarrete, Amadeo Garnet, MD  azithromycin [facility]) 100 MG/5ML suspension 500 mg (25mL) on day one, followed by 250 mg (12.62mL) on days 2,3,4, and 5. 10/10/23  Yes Norvin Ohlin, Amadeo Garnet, MD  amLODipine (NORVASC) 2.5 MG tablet TAKE 1 TABLET BY MOUTH DAILY Patient taking differently: Take 2.5 mg by mouth in the morning. 01/10/23   Nahser, Deloris Ping, MD  atorvastatin (LIPITOR) 20 MG tablet Take 20 mg by mouth at bedtime. 07/07/23   [provider]   Cholecalciferol (VITAMIN D3) 125 MCG (5000 UT) CAPS Take 5,000 Units by mouth daily.    [provider]  citalopram (CELEXA) 20 MG tablet Take 20 mg by mouth at bedtime.    [provider]  divalproex (DEPAKOTE SPRINKLE) 125 MG capsule Take 250 mg by mouth in the morning and at bedtime.    [provider]  fluticasone (FLONASE) 50 MCG/ACT nasal spray Place 2 sprays into both nostrils at bedtime.    [provider]  levothyroxine (SYNTHROID) 50 MCG tablet Take 50 mcg by mouth daily before breakfast.    [provider]  LORazepam (ATIVAN) 0.5 MG tablet Take 0.5 mg by mouth See admin instructions. Take 0.5 mg by mouth at 2 PM and an additional 0.5 mg every 8 hours as needed for agitation 07/20/23   [provider]  losartan (COZAAR) 100 MG tablet TAKE 1 TABLET BY MOUTH DAILY Patient taking differently: Take 100 mg by mouth in the morning. 12/16/22   Nahser, Deloris Ping, MD  Multiple Vitamins-Minerals (CENTRUM SILVER PO) Take 1 tablet by mouth daily with breakfast.    [provider]  Propylene Glycol (SYSTANE BALANCE) 0.6 % SOLN Place 1 drop into both eyes in the morning.    [provider]  QUEtiapine (SEROQUEL) 50 MG tablet Take 50 mg by mouth 3 (three) times daily.    [provider]  Allergies Patient has no known allergies.  Review of Systems Review of Systems As noted in HPI  Physical Exam Vital Signs  I have reviewed the triage vital signs BP 117/70   Pulse 61   Temp 100.2 F (37.9 C) (Rectal)   Resp 16   SpO2 95%   Physical Exam Vitals reviewed.  Constitutional:      General: He is not in acute distress.    Appearance: He is well-developed. He is not diaphoretic.  HENT:     Head: Normocephalic and atraumatic.     Nose: Nose normal.  Eyes:     General: No scleral  icterus.       Right eye: No discharge.        Left eye: No discharge.     Conjunctiva/sclera: Conjunctivae normal.     Pupils: Pupils are equal, round, and reactive to light.  Cardiovascular:     Rate and Rhythm: Normal rate and regular rhythm.     Heart sounds: No murmur heard.    No friction rub. No gallop.  Pulmonary:     Effort: Pulmonary effort is normal. No respiratory distress.     Breath sounds: Normal breath sounds. No stridor. No rales.  Abdominal:     General: There is no distension.     Palpations: Abdomen is soft.     Tenderness: There is no abdominal tenderness.  Musculoskeletal:        General: No tenderness.     Cervical back: Normal range of motion and neck supple.  Skin:    General: Skin is warm and dry.     Findings: No erythema or rash.  Neurological:     Mental Status: He is alert. He is disoriented.     Comments: Pleasantly demented Moves all extremities     ED Results and Treatments Labs (all labs ordered are listed, but only abnormal results are displayed) Labs Reviewed  COMPREHENSIVE METABOLIC PANEL - Abnormal; Notable for the following components:      Result Value   BUN 28 (*)    Calcium 8.3 (*)    Total Protein 6.0 (*)    Albumin 2.9 (*)    All other components within normal limits  CBC WITH DIFFERENTIAL/PLATELET - Abnormal; Notable for the following components:   WBC 10.6 (*)    RBC 3.71 (*)    Hemoglobin 10.6 (*)    HCT 33.7 (*)    Neutro Abs 8.9 (*)    All other components within normal limits  URINALYSIS, W/ REFLEX TO CULTURE (INFECTION SUSPECTED) - Abnormal; Notable for the following components:   Ketones, ur 5 (*)    All other components within normal limits  PROTIME-INR - Abnormal; Notable for the following components:   Prothrombin Time 15.5 (*)    All other components within normal limits  APTT - Abnormal; Notable for the following components:   aPTT 43 (*)    All other components within normal limits  CULTURE, BLOOD  (ROUTINE X 2)  RESP PANEL BY RT-PCR (RSV, FLU A&B, COVID)  RVPGX2  CULTURE, BLOOD (ROUTINE X 2)  LACTIC ACID, PLASMA  EKG  EKG Interpretation Date/Time:  Sunday October 09 2023 23:10:12 EST Ventricular Rate:  69 PR Interval:  209 QRS Duration:  157 QT Interval:  450 QTC Calculation: 483 R Axis:   92  Text Interpretation: Sinus rhythm RBBB and LPFB No significant change was found Confirmed by Laramie Meissner ([id number]) on 10/09/2023 11:50:13 PM       Radiology DG Chest Port 1 View Result Date: 10/10/2023 CLINICAL DATA:  Questionable sepsis - evaluate for abnormality. Fever EXAM: PORTABLE CHEST 1 VIEW COMPARISON:  09/12/2023 FINDINGS: Heart and mediastinal contours are within normal limits. Patchy bilateral airspace disease. No visible effusions. No acute bony abnormality. IMPRESSION: Patchy bilateral airspace disease concerning for pneumonia, less likely edema. Electronically Signed   By: Kevin  Dover M.D.   On: 10/10/2023 00:24    Medications Ordered in ED Medications  sodium chloride 0.9 % bolus 1,000 mL (0 mLs Intravenous Stopped 10/10/23 0013)  cefTRIAXone (ROCEPHIN) 1 g in sodium chloride 0.9 % 100 mL IVPB (0 g Intravenous Stopped 10/10/23 0217)   Procedures Procedures  (including critical care time) Medical Decision Making / ED Course   Medical Decision Making Amount and/or Complexity of Data Reviewed Labs: ordered. Radiology: ordered.     Clinical Course as of 10/10/23 0229  Sun Oct 09, 2023  2328 Fever. From skilled nursing facility/memory unit.  Differential diagnosis considered and workup below.  Infectious workup initiated.  Will also assess for electrolyte/metabolic derangements.  IVF provided. [PC]  Mon Oct 10, 2023  0223 Infectious workup notable for bibasilar pneumonia.  UA negative.  Abdomen was benign so I have low suspicion for serious  intra-abdominal hematosis infectious process.  Respiratory panel was negative for COVID, influenza, RSV.  Patient is not septic and appropriate for outpatient management.  Given first dose of IV Rocephin in the emergency department. [PC]    Clinical Course User Index [PC] Acen Craun Eduardo, MD    Final Clinical Impression(s) / ED Diagnoses Final diagnoses:  Community acquired pneumonia of both lower lobes   The patient appears reasonably screened and/or stabilized for discharge and I doubt any other medical condition or other EMC requiring further screening, evaluation, or treatment in the ED at this time. I have discussed the findings, Dx and Tx plan with the patient/family who expressed understanding and agree(s) with the plan. Discharge instructions discussed at length. The patient/family was given strict return precautions who verbalized understanding of the instructions. No further questions at time of discharge.  Disposition: Discharge  Condition: Good  ED Discharge Orders          Ordered    azithromycin (ZITHROMAX) 100 MG/5ML suspension        10/10/23 0227    amoxicillin-clavulanate (AUGMENTIN) 400-57 MG/5ML suspension  2 times daily        02 /17/25 8295              Follow Up: Cleatis Polka., MD [address] [place] [address] [phone]  Call  to schedule an appointment for close follow up    This chart was dictated using voice recognition software.  Despite best efforts to proofread,  errors can occur which can change the documentation meaning.    Nira Conn, MD 10/10/23 [phone]

## 2023-10-10 NOTE — Discharge Instructions (Addendum)
Please provide antibiotics as prescribed.

## 2023-10-14 DIAGNOSIS — I493 Ventricular premature depolarization: Secondary | ICD-10-CM | POA: Diagnosis not present

## 2023-10-14 DIAGNOSIS — I10 Essential (primary) hypertension: Secondary | ICD-10-CM | POA: Diagnosis not present

## 2023-10-14 DIAGNOSIS — J189 Pneumonia, unspecified organism: Secondary | ICD-10-CM | POA: Diagnosis not present

## 2023-10-14 DIAGNOSIS — C760 Malignant neoplasm of head, face and neck: Secondary | ICD-10-CM | POA: Diagnosis not present

## 2023-10-14 DIAGNOSIS — G301 Alzheimer's disease with late onset: Secondary | ICD-10-CM | POA: Diagnosis not present

## 2023-10-14 DIAGNOSIS — G319 Degenerative disease of nervous system, unspecified: Secondary | ICD-10-CM | POA: Diagnosis not present

## 2023-10-14 DIAGNOSIS — R001 Bradycardia, unspecified: Secondary | ICD-10-CM | POA: Diagnosis not present

## 2023-10-14 DIAGNOSIS — F02C11 Dementia in other diseases classified elsewhere, severe, with agitation: Secondary | ICD-10-CM | POA: Diagnosis not present

## 2023-10-14 DIAGNOSIS — D72829 Elevated white blood cell count, unspecified: Secondary | ICD-10-CM | POA: Diagnosis not present

## 2023-10-15 LAB — CULTURE, BLOOD (ROUTINE X 2)
Culture: NO GROWTH
Culture: NO GROWTH

## 2023-10-22 DIAGNOSIS — F02B0 Dementia in other diseases classified elsewhere, moderate, without behavioral disturbance, psychotic disturbance, mood disturbance, and anxiety: Secondary | ICD-10-CM | POA: Diagnosis not present

## 2023-10-25 DIAGNOSIS — F02C11 Dementia in other diseases classified elsewhere, severe, with agitation: Secondary | ICD-10-CM | POA: Diagnosis not present

## 2023-10-25 DIAGNOSIS — T148XXA Other injury of unspecified body region, initial encounter: Secondary | ICD-10-CM | POA: Diagnosis not present

## 2023-10-25 DIAGNOSIS — R059 Cough, unspecified: Secondary | ICD-10-CM | POA: Diagnosis not present

## 2023-10-25 DIAGNOSIS — Z1152 Encounter for screening for COVID-19: Secondary | ICD-10-CM | POA: Diagnosis not present

## 2023-10-25 DIAGNOSIS — R5383 Other fatigue: Secondary | ICD-10-CM | POA: Diagnosis not present

## 2023-10-25 DIAGNOSIS — J189 Pneumonia, unspecified organism: Secondary | ICD-10-CM | POA: Diagnosis not present

## 2023-10-30 ENCOUNTER — Other Ambulatory Visit: Payer: Self-pay | Admitting: Cardiovascular Disease

## 2023-11-14 DIAGNOSIS — F03B3 Unspecified dementia, moderate, with mood disturbance: Secondary | ICD-10-CM | POA: Diagnosis not present

## 2023-11-14 DIAGNOSIS — Z515 Encounter for palliative care: Secondary | ICD-10-CM | POA: Diagnosis not present

## 2023-11-22 DIAGNOSIS — F02B0 Dementia in other diseases classified elsewhere, moderate, without behavioral disturbance, psychotic disturbance, mood disturbance, and anxiety: Secondary | ICD-10-CM | POA: Diagnosis not present

## 2023-11-23 ENCOUNTER — Other Ambulatory Visit: Payer: Self-pay | Admitting: Cardiovascular Disease

## 2023-11-23 DIAGNOSIS — I10 Essential (primary) hypertension: Secondary | ICD-10-CM

## 2023-12-19 DIAGNOSIS — E785 Hyperlipidemia, unspecified: Secondary | ICD-10-CM | POA: Diagnosis not present

## 2023-12-19 DIAGNOSIS — I129 Hypertensive chronic kidney disease with stage 1 through stage 4 chronic kidney disease, or unspecified chronic kidney disease: Secondary | ICD-10-CM | POA: Diagnosis not present

## 2023-12-19 DIAGNOSIS — Z125 Encounter for screening for malignant neoplasm of prostate: Secondary | ICD-10-CM | POA: Diagnosis not present

## 2023-12-19 DIAGNOSIS — M810 Age-related osteoporosis without current pathological fracture: Secondary | ICD-10-CM | POA: Diagnosis not present

## 2023-12-19 DIAGNOSIS — N182 Chronic kidney disease, stage 2 (mild): Secondary | ICD-10-CM | POA: Diagnosis not present

## 2023-12-19 DIAGNOSIS — E039 Hypothyroidism, unspecified: Secondary | ICD-10-CM | POA: Diagnosis not present

## 2023-12-26 DIAGNOSIS — Z0000 Encounter for general adult medical examination without abnormal findings: Secondary | ICD-10-CM | POA: Diagnosis not present

## 2023-12-26 DIAGNOSIS — I493 Ventricular premature depolarization: Secondary | ICD-10-CM | POA: Diagnosis not present

## 2023-12-26 DIAGNOSIS — F02C11 Dementia in other diseases classified elsewhere, severe, with agitation: Secondary | ICD-10-CM | POA: Diagnosis not present

## 2023-12-26 DIAGNOSIS — I129 Hypertensive chronic kidney disease with stage 1 through stage 4 chronic kidney disease, or unspecified chronic kidney disease: Secondary | ICD-10-CM | POA: Diagnosis not present

## 2023-12-26 DIAGNOSIS — C8599 Non-Hodgkin lymphoma, unspecified, extranodal and solid organ sites: Secondary | ICD-10-CM | POA: Diagnosis not present

## 2023-12-26 DIAGNOSIS — E039 Hypothyroidism, unspecified: Secondary | ICD-10-CM | POA: Diagnosis not present

## 2023-12-26 DIAGNOSIS — C760 Malignant neoplasm of head, face and neck: Secondary | ICD-10-CM | POA: Diagnosis not present

## 2023-12-26 DIAGNOSIS — G301 Alzheimer's disease with late onset: Secondary | ICD-10-CM | POA: Diagnosis not present

## 2023-12-26 DIAGNOSIS — C884 Extranodal marginal zone b-cell lymphoma of mucosa-associated lymphoid tissue (malt-lymphoma) not having achieved remission: Secondary | ICD-10-CM | POA: Diagnosis not present

## 2023-12-26 DIAGNOSIS — Z8546 Personal history of malignant neoplasm of prostate: Secondary | ICD-10-CM | POA: Diagnosis not present

## 2023-12-26 DIAGNOSIS — M810 Age-related osteoporosis without current pathological fracture: Secondary | ICD-10-CM | POA: Diagnosis not present

## 2023-12-26 DIAGNOSIS — Z1339 Encounter for screening examination for other mental health and behavioral disorders: Secondary | ICD-10-CM | POA: Diagnosis not present

## 2023-12-26 DIAGNOSIS — E785 Hyperlipidemia, unspecified: Secondary | ICD-10-CM | POA: Diagnosis not present

## 2023-12-26 DIAGNOSIS — N182 Chronic kidney disease, stage 2 (mild): Secondary | ICD-10-CM | POA: Diagnosis not present

## 2023-12-26 DIAGNOSIS — Z1331 Encounter for screening for depression: Secondary | ICD-10-CM | POA: Diagnosis not present

## 2023-12-28 ENCOUNTER — Other Ambulatory Visit: Payer: Self-pay | Admitting: Cardiovascular Disease

## 2023-12-28 DIAGNOSIS — I10 Essential (primary) hypertension: Secondary | ICD-10-CM

## 2023-12-31 DIAGNOSIS — F419 Anxiety disorder, unspecified: Secondary | ICD-10-CM | POA: Diagnosis not present

## 2023-12-31 DIAGNOSIS — C8589 Other specified types of non-Hodgkin lymphoma, extranodal and solid organ sites: Secondary | ICD-10-CM | POA: Diagnosis not present

## 2023-12-31 DIAGNOSIS — F32A Depression, unspecified: Secondary | ICD-10-CM | POA: Diagnosis not present

## 2023-12-31 DIAGNOSIS — E039 Hypothyroidism, unspecified: Secondary | ICD-10-CM | POA: Diagnosis not present

## 2023-12-31 DIAGNOSIS — G309 Alzheimer's disease, unspecified: Secondary | ICD-10-CM | POA: Diagnosis not present

## 2023-12-31 DIAGNOSIS — I10 Essential (primary) hypertension: Secondary | ICD-10-CM | POA: Diagnosis not present

## 2023-12-31 DIAGNOSIS — I679 Cerebrovascular disease, unspecified: Secondary | ICD-10-CM | POA: Diagnosis not present

## 2023-12-31 DIAGNOSIS — E785 Hyperlipidemia, unspecified: Secondary | ICD-10-CM | POA: Diagnosis not present

## 2023-12-31 DIAGNOSIS — U071 COVID-19: Secondary | ICD-10-CM | POA: Diagnosis not present

## 2023-12-31 DIAGNOSIS — Z8616 Personal history of COVID-19: Secondary | ICD-10-CM | POA: Diagnosis not present

## 2024-01-01 DIAGNOSIS — C8589 Other specified types of non-Hodgkin lymphoma, extranodal and solid organ sites: Secondary | ICD-10-CM | POA: Diagnosis not present

## 2024-01-01 DIAGNOSIS — G309 Alzheimer's disease, unspecified: Secondary | ICD-10-CM | POA: Diagnosis not present

## 2024-01-01 DIAGNOSIS — E039 Hypothyroidism, unspecified: Secondary | ICD-10-CM | POA: Diagnosis not present

## 2024-01-01 DIAGNOSIS — I679 Cerebrovascular disease, unspecified: Secondary | ICD-10-CM | POA: Diagnosis not present

## 2024-01-01 DIAGNOSIS — I10 Essential (primary) hypertension: Secondary | ICD-10-CM | POA: Diagnosis not present

## 2024-01-01 DIAGNOSIS — E785 Hyperlipidemia, unspecified: Secondary | ICD-10-CM | POA: Diagnosis not present

## 2024-01-02 DIAGNOSIS — I10 Essential (primary) hypertension: Secondary | ICD-10-CM | POA: Diagnosis not present

## 2024-01-02 DIAGNOSIS — E785 Hyperlipidemia, unspecified: Secondary | ICD-10-CM | POA: Diagnosis not present

## 2024-01-02 DIAGNOSIS — E039 Hypothyroidism, unspecified: Secondary | ICD-10-CM | POA: Diagnosis not present

## 2024-01-02 DIAGNOSIS — I679 Cerebrovascular disease, unspecified: Secondary | ICD-10-CM | POA: Diagnosis not present

## 2024-01-02 DIAGNOSIS — G309 Alzheimer's disease, unspecified: Secondary | ICD-10-CM | POA: Diagnosis not present

## 2024-01-02 DIAGNOSIS — C8589 Other specified types of non-Hodgkin lymphoma, extranodal and solid organ sites: Secondary | ICD-10-CM | POA: Diagnosis not present

## 2024-01-04 DIAGNOSIS — F419 Anxiety disorder, unspecified: Secondary | ICD-10-CM | POA: Diagnosis not present

## 2024-01-04 DIAGNOSIS — F32A Depression, unspecified: Secondary | ICD-10-CM | POA: Diagnosis not present

## 2024-01-04 DIAGNOSIS — F0280 Dementia in other diseases classified elsewhere without behavioral disturbance: Secondary | ICD-10-CM | POA: Diagnosis not present

## 2024-01-04 DIAGNOSIS — I679 Cerebrovascular disease, unspecified: Secondary | ICD-10-CM | POA: Diagnosis not present

## 2024-01-04 DIAGNOSIS — C8589 Other specified types of non-Hodgkin lymphoma, extranodal and solid organ sites: Secondary | ICD-10-CM | POA: Diagnosis not present

## 2024-01-04 DIAGNOSIS — I10 Essential (primary) hypertension: Secondary | ICD-10-CM | POA: Diagnosis not present

## 2024-01-04 DIAGNOSIS — E785 Hyperlipidemia, unspecified: Secondary | ICD-10-CM | POA: Diagnosis not present

## 2024-01-04 DIAGNOSIS — E039 Hypothyroidism, unspecified: Secondary | ICD-10-CM | POA: Diagnosis not present

## 2024-01-04 DIAGNOSIS — G309 Alzheimer's disease, unspecified: Secondary | ICD-10-CM | POA: Diagnosis not present

## 2024-01-06 DIAGNOSIS — G309 Alzheimer's disease, unspecified: Secondary | ICD-10-CM | POA: Diagnosis not present

## 2024-01-06 DIAGNOSIS — E785 Hyperlipidemia, unspecified: Secondary | ICD-10-CM | POA: Diagnosis not present

## 2024-01-06 DIAGNOSIS — I679 Cerebrovascular disease, unspecified: Secondary | ICD-10-CM | POA: Diagnosis not present

## 2024-01-06 DIAGNOSIS — C8589 Other specified types of non-Hodgkin lymphoma, extranodal and solid organ sites: Secondary | ICD-10-CM | POA: Diagnosis not present

## 2024-01-06 DIAGNOSIS — E039 Hypothyroidism, unspecified: Secondary | ICD-10-CM | POA: Diagnosis not present

## 2024-01-06 DIAGNOSIS — I10 Essential (primary) hypertension: Secondary | ICD-10-CM | POA: Diagnosis not present

## 2024-01-09 DIAGNOSIS — E039 Hypothyroidism, unspecified: Secondary | ICD-10-CM | POA: Diagnosis not present

## 2024-01-09 DIAGNOSIS — C8589 Other specified types of non-Hodgkin lymphoma, extranodal and solid organ sites: Secondary | ICD-10-CM | POA: Diagnosis not present

## 2024-01-09 DIAGNOSIS — G309 Alzheimer's disease, unspecified: Secondary | ICD-10-CM | POA: Diagnosis not present

## 2024-01-09 DIAGNOSIS — I10 Essential (primary) hypertension: Secondary | ICD-10-CM | POA: Diagnosis not present

## 2024-01-09 DIAGNOSIS — I679 Cerebrovascular disease, unspecified: Secondary | ICD-10-CM | POA: Diagnosis not present

## 2024-01-09 DIAGNOSIS — E785 Hyperlipidemia, unspecified: Secondary | ICD-10-CM | POA: Diagnosis not present

## 2024-01-10 DIAGNOSIS — I679 Cerebrovascular disease, unspecified: Secondary | ICD-10-CM | POA: Diagnosis not present

## 2024-01-10 DIAGNOSIS — G309 Alzheimer's disease, unspecified: Secondary | ICD-10-CM | POA: Diagnosis not present

## 2024-01-10 DIAGNOSIS — E785 Hyperlipidemia, unspecified: Secondary | ICD-10-CM | POA: Diagnosis not present

## 2024-01-10 DIAGNOSIS — I10 Essential (primary) hypertension: Secondary | ICD-10-CM | POA: Diagnosis not present

## 2024-01-10 DIAGNOSIS — C8589 Other specified types of non-Hodgkin lymphoma, extranodal and solid organ sites: Secondary | ICD-10-CM | POA: Diagnosis not present

## 2024-01-10 DIAGNOSIS — E039 Hypothyroidism, unspecified: Secondary | ICD-10-CM | POA: Diagnosis not present

## 2024-01-13 DIAGNOSIS — G309 Alzheimer's disease, unspecified: Secondary | ICD-10-CM | POA: Diagnosis not present

## 2024-01-13 DIAGNOSIS — C8589 Other specified types of non-Hodgkin lymphoma, extranodal and solid organ sites: Secondary | ICD-10-CM | POA: Diagnosis not present

## 2024-01-13 DIAGNOSIS — I679 Cerebrovascular disease, unspecified: Secondary | ICD-10-CM | POA: Diagnosis not present

## 2024-01-13 DIAGNOSIS — E785 Hyperlipidemia, unspecified: Secondary | ICD-10-CM | POA: Diagnosis not present

## 2024-01-13 DIAGNOSIS — E039 Hypothyroidism, unspecified: Secondary | ICD-10-CM | POA: Diagnosis not present

## 2024-01-13 DIAGNOSIS — I10 Essential (primary) hypertension: Secondary | ICD-10-CM | POA: Diagnosis not present

## 2024-01-18 DIAGNOSIS — E039 Hypothyroidism, unspecified: Secondary | ICD-10-CM | POA: Diagnosis not present

## 2024-01-18 DIAGNOSIS — C8589 Other specified types of non-Hodgkin lymphoma, extranodal and solid organ sites: Secondary | ICD-10-CM | POA: Diagnosis not present

## 2024-01-18 DIAGNOSIS — I10 Essential (primary) hypertension: Secondary | ICD-10-CM | POA: Diagnosis not present

## 2024-01-18 DIAGNOSIS — I679 Cerebrovascular disease, unspecified: Secondary | ICD-10-CM | POA: Diagnosis not present

## 2024-01-18 DIAGNOSIS — G309 Alzheimer's disease, unspecified: Secondary | ICD-10-CM | POA: Diagnosis not present

## 2024-01-18 DIAGNOSIS — E785 Hyperlipidemia, unspecified: Secondary | ICD-10-CM | POA: Diagnosis not present

## 2024-01-19 DIAGNOSIS — I679 Cerebrovascular disease, unspecified: Secondary | ICD-10-CM | POA: Diagnosis not present

## 2024-01-19 DIAGNOSIS — G309 Alzheimer's disease, unspecified: Secondary | ICD-10-CM | POA: Diagnosis not present

## 2024-01-19 DIAGNOSIS — E039 Hypothyroidism, unspecified: Secondary | ICD-10-CM | POA: Diagnosis not present

## 2024-01-19 DIAGNOSIS — E785 Hyperlipidemia, unspecified: Secondary | ICD-10-CM | POA: Diagnosis not present

## 2024-01-19 DIAGNOSIS — C8589 Other specified types of non-Hodgkin lymphoma, extranodal and solid organ sites: Secondary | ICD-10-CM | POA: Diagnosis not present

## 2024-01-19 DIAGNOSIS — I10 Essential (primary) hypertension: Secondary | ICD-10-CM | POA: Diagnosis not present

## 2024-01-21 DIAGNOSIS — I679 Cerebrovascular disease, unspecified: Secondary | ICD-10-CM | POA: Diagnosis not present

## 2024-01-21 DIAGNOSIS — I10 Essential (primary) hypertension: Secondary | ICD-10-CM | POA: Diagnosis not present

## 2024-01-21 DIAGNOSIS — E039 Hypothyroidism, unspecified: Secondary | ICD-10-CM | POA: Diagnosis not present

## 2024-01-21 DIAGNOSIS — G309 Alzheimer's disease, unspecified: Secondary | ICD-10-CM | POA: Diagnosis not present

## 2024-01-21 DIAGNOSIS — E785 Hyperlipidemia, unspecified: Secondary | ICD-10-CM | POA: Diagnosis not present

## 2024-01-21 DIAGNOSIS — C8589 Other specified types of non-Hodgkin lymphoma, extranodal and solid organ sites: Secondary | ICD-10-CM | POA: Diagnosis not present

## 2024-01-22 DIAGNOSIS — U071 COVID-19: Secondary | ICD-10-CM | POA: Diagnosis not present

## 2024-01-22 DIAGNOSIS — F419 Anxiety disorder, unspecified: Secondary | ICD-10-CM | POA: Diagnosis not present

## 2024-01-22 DIAGNOSIS — E785 Hyperlipidemia, unspecified: Secondary | ICD-10-CM | POA: Diagnosis not present

## 2024-01-22 DIAGNOSIS — E039 Hypothyroidism, unspecified: Secondary | ICD-10-CM | POA: Diagnosis not present

## 2024-01-22 DIAGNOSIS — I10 Essential (primary) hypertension: Secondary | ICD-10-CM | POA: Diagnosis not present

## 2024-01-22 DIAGNOSIS — C8589 Other specified types of non-Hodgkin lymphoma, extranodal and solid organ sites: Secondary | ICD-10-CM | POA: Diagnosis not present

## 2024-01-22 DIAGNOSIS — I679 Cerebrovascular disease, unspecified: Secondary | ICD-10-CM | POA: Diagnosis not present

## 2024-01-22 DIAGNOSIS — F32A Depression, unspecified: Secondary | ICD-10-CM | POA: Diagnosis not present

## 2024-01-22 DIAGNOSIS — G309 Alzheimer's disease, unspecified: Secondary | ICD-10-CM | POA: Diagnosis not present

## 2024-01-22 DIAGNOSIS — Z8616 Personal history of COVID-19: Secondary | ICD-10-CM | POA: Diagnosis not present

## 2024-01-24 DIAGNOSIS — E785 Hyperlipidemia, unspecified: Secondary | ICD-10-CM | POA: Diagnosis not present

## 2024-01-24 DIAGNOSIS — E039 Hypothyroidism, unspecified: Secondary | ICD-10-CM | POA: Diagnosis not present

## 2024-01-24 DIAGNOSIS — I679 Cerebrovascular disease, unspecified: Secondary | ICD-10-CM | POA: Diagnosis not present

## 2024-01-24 DIAGNOSIS — I10 Essential (primary) hypertension: Secondary | ICD-10-CM | POA: Diagnosis not present

## 2024-01-24 DIAGNOSIS — C8589 Other specified types of non-Hodgkin lymphoma, extranodal and solid organ sites: Secondary | ICD-10-CM | POA: Diagnosis not present

## 2024-01-24 DIAGNOSIS — G309 Alzheimer's disease, unspecified: Secondary | ICD-10-CM | POA: Diagnosis not present

## 2024-01-25 DIAGNOSIS — I679 Cerebrovascular disease, unspecified: Secondary | ICD-10-CM | POA: Diagnosis not present

## 2024-01-25 DIAGNOSIS — E039 Hypothyroidism, unspecified: Secondary | ICD-10-CM | POA: Diagnosis not present

## 2024-01-25 DIAGNOSIS — G309 Alzheimer's disease, unspecified: Secondary | ICD-10-CM | POA: Diagnosis not present

## 2024-01-25 DIAGNOSIS — E785 Hyperlipidemia, unspecified: Secondary | ICD-10-CM | POA: Diagnosis not present

## 2024-01-25 DIAGNOSIS — I10 Essential (primary) hypertension: Secondary | ICD-10-CM | POA: Diagnosis not present

## 2024-01-25 DIAGNOSIS — C8589 Other specified types of non-Hodgkin lymphoma, extranodal and solid organ sites: Secondary | ICD-10-CM | POA: Diagnosis not present

## 2024-01-26 DIAGNOSIS — E785 Hyperlipidemia, unspecified: Secondary | ICD-10-CM | POA: Diagnosis not present

## 2024-01-26 DIAGNOSIS — I10 Essential (primary) hypertension: Secondary | ICD-10-CM | POA: Diagnosis not present

## 2024-01-26 DIAGNOSIS — C8589 Other specified types of non-Hodgkin lymphoma, extranodal and solid organ sites: Secondary | ICD-10-CM | POA: Diagnosis not present

## 2024-01-26 DIAGNOSIS — G309 Alzheimer's disease, unspecified: Secondary | ICD-10-CM | POA: Diagnosis not present

## 2024-01-26 DIAGNOSIS — E039 Hypothyroidism, unspecified: Secondary | ICD-10-CM | POA: Diagnosis not present

## 2024-01-26 DIAGNOSIS — I679 Cerebrovascular disease, unspecified: Secondary | ICD-10-CM | POA: Diagnosis not present

## 2024-01-27 DIAGNOSIS — G309 Alzheimer's disease, unspecified: Secondary | ICD-10-CM | POA: Diagnosis not present

## 2024-01-27 DIAGNOSIS — C8589 Other specified types of non-Hodgkin lymphoma, extranodal and solid organ sites: Secondary | ICD-10-CM | POA: Diagnosis not present

## 2024-01-27 DIAGNOSIS — E039 Hypothyroidism, unspecified: Secondary | ICD-10-CM | POA: Diagnosis not present

## 2024-01-27 DIAGNOSIS — E785 Hyperlipidemia, unspecified: Secondary | ICD-10-CM | POA: Diagnosis not present

## 2024-01-27 DIAGNOSIS — I679 Cerebrovascular disease, unspecified: Secondary | ICD-10-CM | POA: Diagnosis not present

## 2024-01-27 DIAGNOSIS — I10 Essential (primary) hypertension: Secondary | ICD-10-CM | POA: Diagnosis not present

## 2024-01-30 DIAGNOSIS — I10 Essential (primary) hypertension: Secondary | ICD-10-CM | POA: Diagnosis not present

## 2024-01-30 DIAGNOSIS — G309 Alzheimer's disease, unspecified: Secondary | ICD-10-CM | POA: Diagnosis not present

## 2024-01-30 DIAGNOSIS — E039 Hypothyroidism, unspecified: Secondary | ICD-10-CM | POA: Diagnosis not present

## 2024-01-30 DIAGNOSIS — I679 Cerebrovascular disease, unspecified: Secondary | ICD-10-CM | POA: Diagnosis not present

## 2024-01-30 DIAGNOSIS — E785 Hyperlipidemia, unspecified: Secondary | ICD-10-CM | POA: Diagnosis not present

## 2024-01-30 DIAGNOSIS — C8589 Other specified types of non-Hodgkin lymphoma, extranodal and solid organ sites: Secondary | ICD-10-CM | POA: Diagnosis not present

## 2024-01-31 DIAGNOSIS — E785 Hyperlipidemia, unspecified: Secondary | ICD-10-CM | POA: Diagnosis not present

## 2024-01-31 DIAGNOSIS — I679 Cerebrovascular disease, unspecified: Secondary | ICD-10-CM | POA: Diagnosis not present

## 2024-01-31 DIAGNOSIS — I10 Essential (primary) hypertension: Secondary | ICD-10-CM | POA: Diagnosis not present

## 2024-01-31 DIAGNOSIS — E039 Hypothyroidism, unspecified: Secondary | ICD-10-CM | POA: Diagnosis not present

## 2024-01-31 DIAGNOSIS — G309 Alzheimer's disease, unspecified: Secondary | ICD-10-CM | POA: Diagnosis not present

## 2024-01-31 DIAGNOSIS — C8589 Other specified types of non-Hodgkin lymphoma, extranodal and solid organ sites: Secondary | ICD-10-CM | POA: Diagnosis not present

## 2024-02-02 DIAGNOSIS — C8589 Other specified types of non-Hodgkin lymphoma, extranodal and solid organ sites: Secondary | ICD-10-CM | POA: Diagnosis not present

## 2024-02-02 DIAGNOSIS — I10 Essential (primary) hypertension: Secondary | ICD-10-CM | POA: Diagnosis not present

## 2024-02-02 DIAGNOSIS — E039 Hypothyroidism, unspecified: Secondary | ICD-10-CM | POA: Diagnosis not present

## 2024-02-02 DIAGNOSIS — G309 Alzheimer's disease, unspecified: Secondary | ICD-10-CM | POA: Diagnosis not present

## 2024-02-02 DIAGNOSIS — E785 Hyperlipidemia, unspecified: Secondary | ICD-10-CM | POA: Diagnosis not present

## 2024-02-02 DIAGNOSIS — I679 Cerebrovascular disease, unspecified: Secondary | ICD-10-CM | POA: Diagnosis not present

## 2024-02-07 DIAGNOSIS — E039 Hypothyroidism, unspecified: Secondary | ICD-10-CM | POA: Diagnosis not present

## 2024-02-07 DIAGNOSIS — G309 Alzheimer's disease, unspecified: Secondary | ICD-10-CM | POA: Diagnosis not present

## 2024-02-07 DIAGNOSIS — C8589 Other specified types of non-Hodgkin lymphoma, extranodal and solid organ sites: Secondary | ICD-10-CM | POA: Diagnosis not present

## 2024-02-07 DIAGNOSIS — I679 Cerebrovascular disease, unspecified: Secondary | ICD-10-CM | POA: Diagnosis not present

## 2024-02-07 DIAGNOSIS — E785 Hyperlipidemia, unspecified: Secondary | ICD-10-CM | POA: Diagnosis not present

## 2024-02-07 DIAGNOSIS — I10 Essential (primary) hypertension: Secondary | ICD-10-CM | POA: Diagnosis not present

## 2024-02-09 DIAGNOSIS — G309 Alzheimer's disease, unspecified: Secondary | ICD-10-CM | POA: Diagnosis not present

## 2024-02-09 DIAGNOSIS — C8589 Other specified types of non-Hodgkin lymphoma, extranodal and solid organ sites: Secondary | ICD-10-CM | POA: Diagnosis not present

## 2024-02-09 DIAGNOSIS — I10 Essential (primary) hypertension: Secondary | ICD-10-CM | POA: Diagnosis not present

## 2024-02-09 DIAGNOSIS — E785 Hyperlipidemia, unspecified: Secondary | ICD-10-CM | POA: Diagnosis not present

## 2024-02-09 DIAGNOSIS — E039 Hypothyroidism, unspecified: Secondary | ICD-10-CM | POA: Diagnosis not present

## 2024-02-09 DIAGNOSIS — I679 Cerebrovascular disease, unspecified: Secondary | ICD-10-CM | POA: Diagnosis not present

## 2024-02-10 DIAGNOSIS — E785 Hyperlipidemia, unspecified: Secondary | ICD-10-CM | POA: Diagnosis not present

## 2024-02-10 DIAGNOSIS — G309 Alzheimer's disease, unspecified: Secondary | ICD-10-CM | POA: Diagnosis not present

## 2024-02-10 DIAGNOSIS — E039 Hypothyroidism, unspecified: Secondary | ICD-10-CM | POA: Diagnosis not present

## 2024-02-10 DIAGNOSIS — I679 Cerebrovascular disease, unspecified: Secondary | ICD-10-CM | POA: Diagnosis not present

## 2024-02-10 DIAGNOSIS — C8589 Other specified types of non-Hodgkin lymphoma, extranodal and solid organ sites: Secondary | ICD-10-CM | POA: Diagnosis not present

## 2024-02-10 DIAGNOSIS — I10 Essential (primary) hypertension: Secondary | ICD-10-CM | POA: Diagnosis not present

## 2024-02-14 DIAGNOSIS — G309 Alzheimer's disease, unspecified: Secondary | ICD-10-CM | POA: Diagnosis not present

## 2024-02-14 DIAGNOSIS — I10 Essential (primary) hypertension: Secondary | ICD-10-CM | POA: Diagnosis not present

## 2024-02-14 DIAGNOSIS — E039 Hypothyroidism, unspecified: Secondary | ICD-10-CM | POA: Diagnosis not present

## 2024-02-14 DIAGNOSIS — C8589 Other specified types of non-Hodgkin lymphoma, extranodal and solid organ sites: Secondary | ICD-10-CM | POA: Diagnosis not present

## 2024-02-14 DIAGNOSIS — E785 Hyperlipidemia, unspecified: Secondary | ICD-10-CM | POA: Diagnosis not present

## 2024-02-14 DIAGNOSIS — I679 Cerebrovascular disease, unspecified: Secondary | ICD-10-CM | POA: Diagnosis not present

## 2024-02-15 DIAGNOSIS — C8589 Other specified types of non-Hodgkin lymphoma, extranodal and solid organ sites: Secondary | ICD-10-CM | POA: Diagnosis not present

## 2024-02-15 DIAGNOSIS — I679 Cerebrovascular disease, unspecified: Secondary | ICD-10-CM | POA: Diagnosis not present

## 2024-02-15 DIAGNOSIS — E785 Hyperlipidemia, unspecified: Secondary | ICD-10-CM | POA: Diagnosis not present

## 2024-02-15 DIAGNOSIS — G309 Alzheimer's disease, unspecified: Secondary | ICD-10-CM | POA: Diagnosis not present

## 2024-02-15 DIAGNOSIS — E039 Hypothyroidism, unspecified: Secondary | ICD-10-CM | POA: Diagnosis not present

## 2024-02-15 DIAGNOSIS — I10 Essential (primary) hypertension: Secondary | ICD-10-CM | POA: Diagnosis not present

## 2024-02-21 DIAGNOSIS — U071 COVID-19: Secondary | ICD-10-CM | POA: Diagnosis not present

## 2024-02-21 DIAGNOSIS — F32A Depression, unspecified: Secondary | ICD-10-CM | POA: Diagnosis not present

## 2024-02-21 DIAGNOSIS — E039 Hypothyroidism, unspecified: Secondary | ICD-10-CM | POA: Diagnosis not present

## 2024-02-21 DIAGNOSIS — E785 Hyperlipidemia, unspecified: Secondary | ICD-10-CM | POA: Diagnosis not present

## 2024-02-21 DIAGNOSIS — C8589 Other specified types of non-Hodgkin lymphoma, extranodal and solid organ sites: Secondary | ICD-10-CM | POA: Diagnosis not present

## 2024-02-21 DIAGNOSIS — Z8616 Personal history of COVID-19: Secondary | ICD-10-CM | POA: Diagnosis not present

## 2024-02-21 DIAGNOSIS — G309 Alzheimer's disease, unspecified: Secondary | ICD-10-CM | POA: Diagnosis not present

## 2024-02-21 DIAGNOSIS — I679 Cerebrovascular disease, unspecified: Secondary | ICD-10-CM | POA: Diagnosis not present

## 2024-02-21 DIAGNOSIS — F419 Anxiety disorder, unspecified: Secondary | ICD-10-CM | POA: Diagnosis not present

## 2024-02-21 DIAGNOSIS — I10 Essential (primary) hypertension: Secondary | ICD-10-CM | POA: Diagnosis not present

## 2024-02-23 DIAGNOSIS — E039 Hypothyroidism, unspecified: Secondary | ICD-10-CM | POA: Diagnosis not present

## 2024-02-23 DIAGNOSIS — E785 Hyperlipidemia, unspecified: Secondary | ICD-10-CM | POA: Diagnosis not present

## 2024-02-23 DIAGNOSIS — I10 Essential (primary) hypertension: Secondary | ICD-10-CM | POA: Diagnosis not present

## 2024-02-23 DIAGNOSIS — I679 Cerebrovascular disease, unspecified: Secondary | ICD-10-CM | POA: Diagnosis not present

## 2024-02-23 DIAGNOSIS — C8589 Other specified types of non-Hodgkin lymphoma, extranodal and solid organ sites: Secondary | ICD-10-CM | POA: Diagnosis not present

## 2024-02-23 DIAGNOSIS — G309 Alzheimer's disease, unspecified: Secondary | ICD-10-CM | POA: Diagnosis not present

## 2024-02-25 DIAGNOSIS — C8589 Other specified types of non-Hodgkin lymphoma, extranodal and solid organ sites: Secondary | ICD-10-CM | POA: Diagnosis not present

## 2024-02-25 DIAGNOSIS — G309 Alzheimer's disease, unspecified: Secondary | ICD-10-CM | POA: Diagnosis not present

## 2024-02-25 DIAGNOSIS — E785 Hyperlipidemia, unspecified: Secondary | ICD-10-CM | POA: Diagnosis not present

## 2024-02-25 DIAGNOSIS — I10 Essential (primary) hypertension: Secondary | ICD-10-CM | POA: Diagnosis not present

## 2024-02-25 DIAGNOSIS — E039 Hypothyroidism, unspecified: Secondary | ICD-10-CM | POA: Diagnosis not present

## 2024-02-25 DIAGNOSIS — I679 Cerebrovascular disease, unspecified: Secondary | ICD-10-CM | POA: Diagnosis not present

## 2024-02-28 DIAGNOSIS — I679 Cerebrovascular disease, unspecified: Secondary | ICD-10-CM | POA: Diagnosis not present

## 2024-02-28 DIAGNOSIS — C8589 Other specified types of non-Hodgkin lymphoma, extranodal and solid organ sites: Secondary | ICD-10-CM | POA: Diagnosis not present

## 2024-02-28 DIAGNOSIS — E785 Hyperlipidemia, unspecified: Secondary | ICD-10-CM | POA: Diagnosis not present

## 2024-02-28 DIAGNOSIS — E039 Hypothyroidism, unspecified: Secondary | ICD-10-CM | POA: Diagnosis not present

## 2024-02-28 DIAGNOSIS — G309 Alzheimer's disease, unspecified: Secondary | ICD-10-CM | POA: Diagnosis not present

## 2024-02-28 DIAGNOSIS — I10 Essential (primary) hypertension: Secondary | ICD-10-CM | POA: Diagnosis not present

## 2024-02-29 DIAGNOSIS — E785 Hyperlipidemia, unspecified: Secondary | ICD-10-CM | POA: Diagnosis not present

## 2024-02-29 DIAGNOSIS — I679 Cerebrovascular disease, unspecified: Secondary | ICD-10-CM | POA: Diagnosis not present

## 2024-02-29 DIAGNOSIS — I10 Essential (primary) hypertension: Secondary | ICD-10-CM | POA: Diagnosis not present

## 2024-02-29 DIAGNOSIS — G309 Alzheimer's disease, unspecified: Secondary | ICD-10-CM | POA: Diagnosis not present

## 2024-02-29 DIAGNOSIS — C8589 Other specified types of non-Hodgkin lymphoma, extranodal and solid organ sites: Secondary | ICD-10-CM | POA: Diagnosis not present

## 2024-02-29 DIAGNOSIS — E039 Hypothyroidism, unspecified: Secondary | ICD-10-CM | POA: Diagnosis not present

## 2024-03-01 DIAGNOSIS — G309 Alzheimer's disease, unspecified: Secondary | ICD-10-CM | POA: Diagnosis not present

## 2024-03-01 DIAGNOSIS — E785 Hyperlipidemia, unspecified: Secondary | ICD-10-CM | POA: Diagnosis not present

## 2024-03-01 DIAGNOSIS — C8589 Other specified types of non-Hodgkin lymphoma, extranodal and solid organ sites: Secondary | ICD-10-CM | POA: Diagnosis not present

## 2024-03-01 DIAGNOSIS — E039 Hypothyroidism, unspecified: Secondary | ICD-10-CM | POA: Diagnosis not present

## 2024-03-01 DIAGNOSIS — I10 Essential (primary) hypertension: Secondary | ICD-10-CM | POA: Diagnosis not present

## 2024-03-01 DIAGNOSIS — I679 Cerebrovascular disease, unspecified: Secondary | ICD-10-CM | POA: Diagnosis not present

## 2024-03-03 DIAGNOSIS — E785 Hyperlipidemia, unspecified: Secondary | ICD-10-CM | POA: Diagnosis not present

## 2024-03-03 DIAGNOSIS — E039 Hypothyroidism, unspecified: Secondary | ICD-10-CM | POA: Diagnosis not present

## 2024-03-03 DIAGNOSIS — I10 Essential (primary) hypertension: Secondary | ICD-10-CM | POA: Diagnosis not present

## 2024-03-03 DIAGNOSIS — I679 Cerebrovascular disease, unspecified: Secondary | ICD-10-CM | POA: Diagnosis not present

## 2024-03-03 DIAGNOSIS — C8589 Other specified types of non-Hodgkin lymphoma, extranodal and solid organ sites: Secondary | ICD-10-CM | POA: Diagnosis not present

## 2024-03-03 DIAGNOSIS — G309 Alzheimer's disease, unspecified: Secondary | ICD-10-CM | POA: Diagnosis not present

## 2024-03-06 DIAGNOSIS — C8589 Other specified types of non-Hodgkin lymphoma, extranodal and solid organ sites: Secondary | ICD-10-CM | POA: Diagnosis not present

## 2024-03-06 DIAGNOSIS — E039 Hypothyroidism, unspecified: Secondary | ICD-10-CM | POA: Diagnosis not present

## 2024-03-06 DIAGNOSIS — I10 Essential (primary) hypertension: Secondary | ICD-10-CM | POA: Diagnosis not present

## 2024-03-06 DIAGNOSIS — I679 Cerebrovascular disease, unspecified: Secondary | ICD-10-CM | POA: Diagnosis not present

## 2024-03-06 DIAGNOSIS — E785 Hyperlipidemia, unspecified: Secondary | ICD-10-CM | POA: Diagnosis not present

## 2024-03-06 DIAGNOSIS — G309 Alzheimer's disease, unspecified: Secondary | ICD-10-CM | POA: Diagnosis not present

## 2024-03-08 DIAGNOSIS — I10 Essential (primary) hypertension: Secondary | ICD-10-CM | POA: Diagnosis not present

## 2024-03-08 DIAGNOSIS — G309 Alzheimer's disease, unspecified: Secondary | ICD-10-CM | POA: Diagnosis not present

## 2024-03-08 DIAGNOSIS — L810 Postinflammatory hyperpigmentation: Secondary | ICD-10-CM | POA: Diagnosis not present

## 2024-03-08 DIAGNOSIS — E785 Hyperlipidemia, unspecified: Secondary | ICD-10-CM | POA: Diagnosis not present

## 2024-03-08 DIAGNOSIS — C8589 Other specified types of non-Hodgkin lymphoma, extranodal and solid organ sites: Secondary | ICD-10-CM | POA: Diagnosis not present

## 2024-03-08 DIAGNOSIS — E039 Hypothyroidism, unspecified: Secondary | ICD-10-CM | POA: Diagnosis not present

## 2024-03-08 DIAGNOSIS — L309 Dermatitis, unspecified: Secondary | ICD-10-CM | POA: Diagnosis not present

## 2024-03-08 DIAGNOSIS — I679 Cerebrovascular disease, unspecified: Secondary | ICD-10-CM | POA: Diagnosis not present

## 2024-03-09 DIAGNOSIS — I10 Essential (primary) hypertension: Secondary | ICD-10-CM | POA: Diagnosis not present

## 2024-03-09 DIAGNOSIS — I679 Cerebrovascular disease, unspecified: Secondary | ICD-10-CM | POA: Diagnosis not present

## 2024-03-09 DIAGNOSIS — G309 Alzheimer's disease, unspecified: Secondary | ICD-10-CM | POA: Diagnosis not present

## 2024-03-09 DIAGNOSIS — C8589 Other specified types of non-Hodgkin lymphoma, extranodal and solid organ sites: Secondary | ICD-10-CM | POA: Diagnosis not present

## 2024-03-09 DIAGNOSIS — E039 Hypothyroidism, unspecified: Secondary | ICD-10-CM | POA: Diagnosis not present

## 2024-03-09 DIAGNOSIS — E785 Hyperlipidemia, unspecified: Secondary | ICD-10-CM | POA: Diagnosis not present

## 2024-03-13 DIAGNOSIS — G309 Alzheimer's disease, unspecified: Secondary | ICD-10-CM | POA: Diagnosis not present

## 2024-03-13 DIAGNOSIS — I10 Essential (primary) hypertension: Secondary | ICD-10-CM | POA: Diagnosis not present

## 2024-03-13 DIAGNOSIS — I679 Cerebrovascular disease, unspecified: Secondary | ICD-10-CM | POA: Diagnosis not present

## 2024-03-13 DIAGNOSIS — E785 Hyperlipidemia, unspecified: Secondary | ICD-10-CM | POA: Diagnosis not present

## 2024-03-13 DIAGNOSIS — E039 Hypothyroidism, unspecified: Secondary | ICD-10-CM | POA: Diagnosis not present

## 2024-03-13 DIAGNOSIS — C8589 Other specified types of non-Hodgkin lymphoma, extranodal and solid organ sites: Secondary | ICD-10-CM | POA: Diagnosis not present

## 2024-03-14 DIAGNOSIS — I10 Essential (primary) hypertension: Secondary | ICD-10-CM | POA: Diagnosis not present

## 2024-03-14 DIAGNOSIS — C8589 Other specified types of non-Hodgkin lymphoma, extranodal and solid organ sites: Secondary | ICD-10-CM | POA: Diagnosis not present

## 2024-03-14 DIAGNOSIS — I679 Cerebrovascular disease, unspecified: Secondary | ICD-10-CM | POA: Diagnosis not present

## 2024-03-14 DIAGNOSIS — E039 Hypothyroidism, unspecified: Secondary | ICD-10-CM | POA: Diagnosis not present

## 2024-03-14 DIAGNOSIS — E785 Hyperlipidemia, unspecified: Secondary | ICD-10-CM | POA: Diagnosis not present

## 2024-03-14 DIAGNOSIS — G309 Alzheimer's disease, unspecified: Secondary | ICD-10-CM | POA: Diagnosis not present

## 2024-03-15 DIAGNOSIS — I679 Cerebrovascular disease, unspecified: Secondary | ICD-10-CM | POA: Diagnosis not present

## 2024-03-15 DIAGNOSIS — C8589 Other specified types of non-Hodgkin lymphoma, extranodal and solid organ sites: Secondary | ICD-10-CM | POA: Diagnosis not present

## 2024-03-15 DIAGNOSIS — I10 Essential (primary) hypertension: Secondary | ICD-10-CM | POA: Diagnosis not present

## 2024-03-15 DIAGNOSIS — G309 Alzheimer's disease, unspecified: Secondary | ICD-10-CM | POA: Diagnosis not present

## 2024-03-15 DIAGNOSIS — E785 Hyperlipidemia, unspecified: Secondary | ICD-10-CM | POA: Diagnosis not present

## 2024-03-15 DIAGNOSIS — E039 Hypothyroidism, unspecified: Secondary | ICD-10-CM | POA: Diagnosis not present

## 2024-03-16 DIAGNOSIS — N390 Urinary tract infection, site not specified: Secondary | ICD-10-CM | POA: Diagnosis not present

## 2024-03-19 DIAGNOSIS — H353131 Nonexudative age-related macular degeneration, bilateral, early dry stage: Secondary | ICD-10-CM | POA: Diagnosis not present

## 2024-03-19 DIAGNOSIS — I679 Cerebrovascular disease, unspecified: Secondary | ICD-10-CM | POA: Diagnosis not present

## 2024-03-19 DIAGNOSIS — E039 Hypothyroidism, unspecified: Secondary | ICD-10-CM | POA: Diagnosis not present

## 2024-03-19 DIAGNOSIS — C8589 Other specified types of non-Hodgkin lymphoma, extranodal and solid organ sites: Secondary | ICD-10-CM | POA: Diagnosis not present

## 2024-03-19 DIAGNOSIS — G309 Alzheimer's disease, unspecified: Secondary | ICD-10-CM | POA: Diagnosis not present

## 2024-03-19 DIAGNOSIS — E785 Hyperlipidemia, unspecified: Secondary | ICD-10-CM | POA: Diagnosis not present

## 2024-03-19 DIAGNOSIS — C8599 Non-Hodgkin lymphoma, unspecified, extranodal and solid organ sites: Secondary | ICD-10-CM | POA: Diagnosis not present

## 2024-03-19 DIAGNOSIS — I10 Essential (primary) hypertension: Secondary | ICD-10-CM | POA: Diagnosis not present

## 2024-03-20 DIAGNOSIS — E039 Hypothyroidism, unspecified: Secondary | ICD-10-CM | POA: Diagnosis not present

## 2024-03-20 DIAGNOSIS — I10 Essential (primary) hypertension: Secondary | ICD-10-CM | POA: Diagnosis not present

## 2024-03-20 DIAGNOSIS — C8589 Other specified types of non-Hodgkin lymphoma, extranodal and solid organ sites: Secondary | ICD-10-CM | POA: Diagnosis not present

## 2024-03-20 DIAGNOSIS — E785 Hyperlipidemia, unspecified: Secondary | ICD-10-CM | POA: Diagnosis not present

## 2024-03-20 DIAGNOSIS — G309 Alzheimer's disease, unspecified: Secondary | ICD-10-CM | POA: Diagnosis not present

## 2024-03-20 DIAGNOSIS — I679 Cerebrovascular disease, unspecified: Secondary | ICD-10-CM | POA: Diagnosis not present

## 2024-03-22 DIAGNOSIS — E785 Hyperlipidemia, unspecified: Secondary | ICD-10-CM | POA: Diagnosis not present

## 2024-03-22 DIAGNOSIS — I10 Essential (primary) hypertension: Secondary | ICD-10-CM | POA: Diagnosis not present

## 2024-03-22 DIAGNOSIS — I679 Cerebrovascular disease, unspecified: Secondary | ICD-10-CM | POA: Diagnosis not present

## 2024-03-22 DIAGNOSIS — E039 Hypothyroidism, unspecified: Secondary | ICD-10-CM | POA: Diagnosis not present

## 2024-03-22 DIAGNOSIS — G309 Alzheimer's disease, unspecified: Secondary | ICD-10-CM | POA: Diagnosis not present

## 2024-03-22 DIAGNOSIS — C8589 Other specified types of non-Hodgkin lymphoma, extranodal and solid organ sites: Secondary | ICD-10-CM | POA: Diagnosis not present

## 2024-03-23 DIAGNOSIS — G309 Alzheimer's disease, unspecified: Secondary | ICD-10-CM | POA: Diagnosis not present

## 2024-03-23 DIAGNOSIS — F32A Depression, unspecified: Secondary | ICD-10-CM | POA: Diagnosis not present

## 2024-03-23 DIAGNOSIS — E039 Hypothyroidism, unspecified: Secondary | ICD-10-CM | POA: Diagnosis not present

## 2024-03-23 DIAGNOSIS — I10 Essential (primary) hypertension: Secondary | ICD-10-CM | POA: Diagnosis not present

## 2024-03-23 DIAGNOSIS — C8589 Other specified types of non-Hodgkin lymphoma, extranodal and solid organ sites: Secondary | ICD-10-CM | POA: Diagnosis not present

## 2024-03-23 DIAGNOSIS — I679 Cerebrovascular disease, unspecified: Secondary | ICD-10-CM | POA: Diagnosis not present

## 2024-03-23 DIAGNOSIS — F419 Anxiety disorder, unspecified: Secondary | ICD-10-CM | POA: Diagnosis not present

## 2024-03-23 DIAGNOSIS — Z8616 Personal history of COVID-19: Secondary | ICD-10-CM | POA: Diagnosis not present

## 2024-03-23 DIAGNOSIS — E785 Hyperlipidemia, unspecified: Secondary | ICD-10-CM | POA: Diagnosis not present

## 2024-03-23 DIAGNOSIS — U071 COVID-19: Secondary | ICD-10-CM | POA: Diagnosis not present

## 2024-03-27 DIAGNOSIS — E039 Hypothyroidism, unspecified: Secondary | ICD-10-CM | POA: Diagnosis not present

## 2024-03-27 DIAGNOSIS — E785 Hyperlipidemia, unspecified: Secondary | ICD-10-CM | POA: Diagnosis not present

## 2024-03-27 DIAGNOSIS — G309 Alzheimer's disease, unspecified: Secondary | ICD-10-CM | POA: Diagnosis not present

## 2024-03-27 DIAGNOSIS — I10 Essential (primary) hypertension: Secondary | ICD-10-CM | POA: Diagnosis not present

## 2024-03-27 DIAGNOSIS — C8589 Other specified types of non-Hodgkin lymphoma, extranodal and solid organ sites: Secondary | ICD-10-CM | POA: Diagnosis not present

## 2024-03-27 DIAGNOSIS — I679 Cerebrovascular disease, unspecified: Secondary | ICD-10-CM | POA: Diagnosis not present

## 2024-03-29 DIAGNOSIS — I679 Cerebrovascular disease, unspecified: Secondary | ICD-10-CM | POA: Diagnosis not present

## 2024-03-29 DIAGNOSIS — C8589 Other specified types of non-Hodgkin lymphoma, extranodal and solid organ sites: Secondary | ICD-10-CM | POA: Diagnosis not present

## 2024-03-29 DIAGNOSIS — I10 Essential (primary) hypertension: Secondary | ICD-10-CM | POA: Diagnosis not present

## 2024-03-29 DIAGNOSIS — G309 Alzheimer's disease, unspecified: Secondary | ICD-10-CM | POA: Diagnosis not present

## 2024-03-29 DIAGNOSIS — E039 Hypothyroidism, unspecified: Secondary | ICD-10-CM | POA: Diagnosis not present

## 2024-03-29 DIAGNOSIS — E785 Hyperlipidemia, unspecified: Secondary | ICD-10-CM | POA: Diagnosis not present

## 2024-03-30 DIAGNOSIS — C8589 Other specified types of non-Hodgkin lymphoma, extranodal and solid organ sites: Secondary | ICD-10-CM | POA: Diagnosis not present

## 2024-03-30 DIAGNOSIS — I10 Essential (primary) hypertension: Secondary | ICD-10-CM | POA: Diagnosis not present

## 2024-03-30 DIAGNOSIS — G309 Alzheimer's disease, unspecified: Secondary | ICD-10-CM | POA: Diagnosis not present

## 2024-03-30 DIAGNOSIS — I679 Cerebrovascular disease, unspecified: Secondary | ICD-10-CM | POA: Diagnosis not present

## 2024-03-30 DIAGNOSIS — E785 Hyperlipidemia, unspecified: Secondary | ICD-10-CM | POA: Diagnosis not present

## 2024-03-30 DIAGNOSIS — E039 Hypothyroidism, unspecified: Secondary | ICD-10-CM | POA: Diagnosis not present

## 2024-04-02 DIAGNOSIS — G309 Alzheimer's disease, unspecified: Secondary | ICD-10-CM | POA: Diagnosis not present

## 2024-04-02 DIAGNOSIS — E039 Hypothyroidism, unspecified: Secondary | ICD-10-CM | POA: Diagnosis not present

## 2024-04-02 DIAGNOSIS — E785 Hyperlipidemia, unspecified: Secondary | ICD-10-CM | POA: Diagnosis not present

## 2024-04-02 DIAGNOSIS — I10 Essential (primary) hypertension: Secondary | ICD-10-CM | POA: Diagnosis not present

## 2024-04-02 DIAGNOSIS — C8589 Other specified types of non-Hodgkin lymphoma, extranodal and solid organ sites: Secondary | ICD-10-CM | POA: Diagnosis not present

## 2024-04-02 DIAGNOSIS — I679 Cerebrovascular disease, unspecified: Secondary | ICD-10-CM | POA: Diagnosis not present

## 2024-04-03 DIAGNOSIS — C8589 Other specified types of non-Hodgkin lymphoma, extranodal and solid organ sites: Secondary | ICD-10-CM | POA: Diagnosis not present

## 2024-04-03 DIAGNOSIS — E785 Hyperlipidemia, unspecified: Secondary | ICD-10-CM | POA: Diagnosis not present

## 2024-04-03 DIAGNOSIS — I679 Cerebrovascular disease, unspecified: Secondary | ICD-10-CM | POA: Diagnosis not present

## 2024-04-03 DIAGNOSIS — I10 Essential (primary) hypertension: Secondary | ICD-10-CM | POA: Diagnosis not present

## 2024-04-03 DIAGNOSIS — E039 Hypothyroidism, unspecified: Secondary | ICD-10-CM | POA: Diagnosis not present

## 2024-04-03 DIAGNOSIS — G309 Alzheimer's disease, unspecified: Secondary | ICD-10-CM | POA: Diagnosis not present

## 2024-04-05 DIAGNOSIS — E039 Hypothyroidism, unspecified: Secondary | ICD-10-CM | POA: Diagnosis not present

## 2024-04-05 DIAGNOSIS — I10 Essential (primary) hypertension: Secondary | ICD-10-CM | POA: Diagnosis not present

## 2024-04-05 DIAGNOSIS — C8589 Other specified types of non-Hodgkin lymphoma, extranodal and solid organ sites: Secondary | ICD-10-CM | POA: Diagnosis not present

## 2024-04-05 DIAGNOSIS — E785 Hyperlipidemia, unspecified: Secondary | ICD-10-CM | POA: Diagnosis not present

## 2024-04-05 DIAGNOSIS — I679 Cerebrovascular disease, unspecified: Secondary | ICD-10-CM | POA: Diagnosis not present

## 2024-04-05 DIAGNOSIS — G309 Alzheimer's disease, unspecified: Secondary | ICD-10-CM | POA: Diagnosis not present

## 2024-04-08 DIAGNOSIS — I679 Cerebrovascular disease, unspecified: Secondary | ICD-10-CM | POA: Diagnosis not present

## 2024-04-08 DIAGNOSIS — E785 Hyperlipidemia, unspecified: Secondary | ICD-10-CM | POA: Diagnosis not present

## 2024-04-08 DIAGNOSIS — C8589 Other specified types of non-Hodgkin lymphoma, extranodal and solid organ sites: Secondary | ICD-10-CM | POA: Diagnosis not present

## 2024-04-08 DIAGNOSIS — G309 Alzheimer's disease, unspecified: Secondary | ICD-10-CM | POA: Diagnosis not present

## 2024-04-08 DIAGNOSIS — I10 Essential (primary) hypertension: Secondary | ICD-10-CM | POA: Diagnosis not present

## 2024-04-08 DIAGNOSIS — E039 Hypothyroidism, unspecified: Secondary | ICD-10-CM | POA: Diagnosis not present

## 2024-04-09 DIAGNOSIS — F02C11 Dementia in other diseases classified elsewhere, severe, with agitation: Secondary | ICD-10-CM | POA: Diagnosis not present

## 2024-04-09 DIAGNOSIS — G301 Alzheimer's disease with late onset: Secondary | ICD-10-CM | POA: Diagnosis not present

## 2024-04-09 DIAGNOSIS — R82998 Other abnormal findings in urine: Secondary | ICD-10-CM | POA: Diagnosis not present

## 2024-04-09 DIAGNOSIS — I10 Essential (primary) hypertension: Secondary | ICD-10-CM | POA: Diagnosis not present

## 2024-04-10 DIAGNOSIS — E785 Hyperlipidemia, unspecified: Secondary | ICD-10-CM | POA: Diagnosis not present

## 2024-04-10 DIAGNOSIS — I10 Essential (primary) hypertension: Secondary | ICD-10-CM | POA: Diagnosis not present

## 2024-04-10 DIAGNOSIS — G309 Alzheimer's disease, unspecified: Secondary | ICD-10-CM | POA: Diagnosis not present

## 2024-04-10 DIAGNOSIS — I679 Cerebrovascular disease, unspecified: Secondary | ICD-10-CM | POA: Diagnosis not present

## 2024-04-10 DIAGNOSIS — C8589 Other specified types of non-Hodgkin lymphoma, extranodal and solid organ sites: Secondary | ICD-10-CM | POA: Diagnosis not present

## 2024-04-10 DIAGNOSIS — E039 Hypothyroidism, unspecified: Secondary | ICD-10-CM | POA: Diagnosis not present

## 2024-04-12 DIAGNOSIS — E039 Hypothyroidism, unspecified: Secondary | ICD-10-CM | POA: Diagnosis not present

## 2024-04-12 DIAGNOSIS — I679 Cerebrovascular disease, unspecified: Secondary | ICD-10-CM | POA: Diagnosis not present

## 2024-04-12 DIAGNOSIS — I10 Essential (primary) hypertension: Secondary | ICD-10-CM | POA: Diagnosis not present

## 2024-04-12 DIAGNOSIS — C8589 Other specified types of non-Hodgkin lymphoma, extranodal and solid organ sites: Secondary | ICD-10-CM | POA: Diagnosis not present

## 2024-04-12 DIAGNOSIS — E785 Hyperlipidemia, unspecified: Secondary | ICD-10-CM | POA: Diagnosis not present

## 2024-04-12 DIAGNOSIS — G309 Alzheimer's disease, unspecified: Secondary | ICD-10-CM | POA: Diagnosis not present

## 2024-04-13 DIAGNOSIS — F03918 Unspecified dementia, unspecified severity, with other behavioral disturbance: Secondary | ICD-10-CM | POA: Diagnosis not present

## 2024-04-13 DIAGNOSIS — F4312 Post-traumatic stress disorder, chronic: Secondary | ICD-10-CM | POA: Diagnosis not present

## 2024-04-13 DIAGNOSIS — F331 Major depressive disorder, recurrent, moderate: Secondary | ICD-10-CM | POA: Diagnosis not present

## 2024-04-13 DIAGNOSIS — F411 Generalized anxiety disorder: Secondary | ICD-10-CM | POA: Diagnosis not present

## 2024-04-17 DIAGNOSIS — E785 Hyperlipidemia, unspecified: Secondary | ICD-10-CM | POA: Diagnosis not present

## 2024-04-17 DIAGNOSIS — E039 Hypothyroidism, unspecified: Secondary | ICD-10-CM | POA: Diagnosis not present

## 2024-04-17 DIAGNOSIS — C8589 Other specified types of non-Hodgkin lymphoma, extranodal and solid organ sites: Secondary | ICD-10-CM | POA: Diagnosis not present

## 2024-04-17 DIAGNOSIS — G309 Alzheimer's disease, unspecified: Secondary | ICD-10-CM | POA: Diagnosis not present

## 2024-04-17 DIAGNOSIS — I10 Essential (primary) hypertension: Secondary | ICD-10-CM | POA: Diagnosis not present

## 2024-04-17 DIAGNOSIS — I679 Cerebrovascular disease, unspecified: Secondary | ICD-10-CM | POA: Diagnosis not present

## 2024-04-18 DIAGNOSIS — I10 Essential (primary) hypertension: Secondary | ICD-10-CM | POA: Diagnosis not present

## 2024-04-18 DIAGNOSIS — I679 Cerebrovascular disease, unspecified: Secondary | ICD-10-CM | POA: Diagnosis not present

## 2024-04-18 DIAGNOSIS — E039 Hypothyroidism, unspecified: Secondary | ICD-10-CM | POA: Diagnosis not present

## 2024-04-18 DIAGNOSIS — G309 Alzheimer's disease, unspecified: Secondary | ICD-10-CM | POA: Diagnosis not present

## 2024-04-18 DIAGNOSIS — C8589 Other specified types of non-Hodgkin lymphoma, extranodal and solid organ sites: Secondary | ICD-10-CM | POA: Diagnosis not present

## 2024-04-18 DIAGNOSIS — E785 Hyperlipidemia, unspecified: Secondary | ICD-10-CM | POA: Diagnosis not present

## 2024-04-19 DIAGNOSIS — I679 Cerebrovascular disease, unspecified: Secondary | ICD-10-CM | POA: Diagnosis not present

## 2024-04-19 DIAGNOSIS — E785 Hyperlipidemia, unspecified: Secondary | ICD-10-CM | POA: Diagnosis not present

## 2024-04-19 DIAGNOSIS — I10 Essential (primary) hypertension: Secondary | ICD-10-CM | POA: Diagnosis not present

## 2024-04-19 DIAGNOSIS — G309 Alzheimer's disease, unspecified: Secondary | ICD-10-CM | POA: Diagnosis not present

## 2024-04-19 DIAGNOSIS — C8589 Other specified types of non-Hodgkin lymphoma, extranodal and solid organ sites: Secondary | ICD-10-CM | POA: Diagnosis not present

## 2024-04-19 DIAGNOSIS — E039 Hypothyroidism, unspecified: Secondary | ICD-10-CM | POA: Diagnosis not present

## 2024-04-23 DIAGNOSIS — F32A Depression, unspecified: Secondary | ICD-10-CM | POA: Diagnosis not present

## 2024-04-23 DIAGNOSIS — F419 Anxiety disorder, unspecified: Secondary | ICD-10-CM | POA: Diagnosis not present

## 2024-04-23 DIAGNOSIS — I10 Essential (primary) hypertension: Secondary | ICD-10-CM | POA: Diagnosis not present

## 2024-04-23 DIAGNOSIS — I679 Cerebrovascular disease, unspecified: Secondary | ICD-10-CM | POA: Diagnosis not present

## 2024-04-23 DIAGNOSIS — Z8616 Personal history of COVID-19: Secondary | ICD-10-CM | POA: Diagnosis not present

## 2024-04-23 DIAGNOSIS — C8589 Other specified types of non-Hodgkin lymphoma, extranodal and solid organ sites: Secondary | ICD-10-CM | POA: Diagnosis not present

## 2024-04-23 DIAGNOSIS — E785 Hyperlipidemia, unspecified: Secondary | ICD-10-CM | POA: Diagnosis not present

## 2024-04-23 DIAGNOSIS — G309 Alzheimer's disease, unspecified: Secondary | ICD-10-CM | POA: Diagnosis not present

## 2024-04-23 DIAGNOSIS — E039 Hypothyroidism, unspecified: Secondary | ICD-10-CM | POA: Diagnosis not present

## 2024-04-23 DIAGNOSIS — U071 COVID-19: Secondary | ICD-10-CM | POA: Diagnosis not present

## 2024-04-24 DIAGNOSIS — E785 Hyperlipidemia, unspecified: Secondary | ICD-10-CM | POA: Diagnosis not present

## 2024-04-24 DIAGNOSIS — E039 Hypothyroidism, unspecified: Secondary | ICD-10-CM | POA: Diagnosis not present

## 2024-04-24 DIAGNOSIS — G309 Alzheimer's disease, unspecified: Secondary | ICD-10-CM | POA: Diagnosis not present

## 2024-04-24 DIAGNOSIS — I679 Cerebrovascular disease, unspecified: Secondary | ICD-10-CM | POA: Diagnosis not present

## 2024-04-24 DIAGNOSIS — I10 Essential (primary) hypertension: Secondary | ICD-10-CM | POA: Diagnosis not present

## 2024-04-24 DIAGNOSIS — C8589 Other specified types of non-Hodgkin lymphoma, extranodal and solid organ sites: Secondary | ICD-10-CM | POA: Diagnosis not present

## 2024-04-25 ENCOUNTER — Emergency Department (HOSPITAL_COMMUNITY)

## 2024-04-25 ENCOUNTER — Encounter (HOSPITAL_COMMUNITY): Payer: Self-pay | Admitting: Emergency Medicine

## 2024-04-25 ENCOUNTER — Inpatient Hospital Stay (HOSPITAL_COMMUNITY)
Admission: EM | Admit: 2024-04-25 | Discharge: 2024-05-21 | DRG: 391 | Disposition: A | Discharge status: Skilled Nursing Facility | Source: Skilled Nursing Facility | Attending: Internal Medicine | Admitting: Internal Medicine

## 2024-04-25 ENCOUNTER — Other Ambulatory Visit: Payer: Self-pay

## 2024-04-25 DIAGNOSIS — R457 State of emotional shock and stress, unspecified: Secondary | ICD-10-CM | POA: Diagnosis not present

## 2024-04-25 DIAGNOSIS — K5289 Other specified noninfective gastroenteritis and colitis: Secondary | ICD-10-CM | POA: Diagnosis not present

## 2024-04-25 DIAGNOSIS — I10 Essential (primary) hypertension: Secondary | ICD-10-CM | POA: Diagnosis present

## 2024-04-25 DIAGNOSIS — E785 Hyperlipidemia, unspecified: Secondary | ICD-10-CM | POA: Diagnosis present

## 2024-04-25 DIAGNOSIS — F32A Depression, unspecified: Secondary | ICD-10-CM | POA: Diagnosis present

## 2024-04-25 DIAGNOSIS — Z515 Encounter for palliative care: Secondary | ICD-10-CM

## 2024-04-25 DIAGNOSIS — Z79890 Hormone replacement therapy (postmenopausal): Secondary | ICD-10-CM

## 2024-04-25 DIAGNOSIS — F03918 Unspecified dementia, unspecified severity, with other behavioral disturbance: Secondary | ICD-10-CM | POA: Diagnosis present

## 2024-04-25 DIAGNOSIS — C8589 Other specified types of non-Hodgkin lymphoma, extranodal and solid organ sites: Secondary | ICD-10-CM | POA: Diagnosis not present

## 2024-04-25 DIAGNOSIS — G309 Alzheimer's disease, unspecified: Secondary | ICD-10-CM | POA: Diagnosis not present

## 2024-04-25 DIAGNOSIS — G9341 Metabolic encephalopathy: Secondary | ICD-10-CM | POA: Diagnosis present

## 2024-04-25 DIAGNOSIS — F0283 Dementia in other diseases classified elsewhere, unspecified severity, with mood disturbance: Secondary | ICD-10-CM | POA: Diagnosis present

## 2024-04-25 DIAGNOSIS — E039 Hypothyroidism, unspecified: Secondary | ICD-10-CM | POA: Diagnosis present

## 2024-04-25 DIAGNOSIS — F05 Delirium due to known physiological condition: Secondary | ICD-10-CM | POA: Diagnosis present

## 2024-04-25 DIAGNOSIS — R0602 Shortness of breath: Secondary | ICD-10-CM | POA: Diagnosis present

## 2024-04-25 DIAGNOSIS — Z79899 Other long term (current) drug therapy: Secondary | ICD-10-CM

## 2024-04-25 DIAGNOSIS — R911 Solitary pulmonary nodule: Secondary | ICD-10-CM | POA: Diagnosis present

## 2024-04-25 DIAGNOSIS — I160 Hypertensive urgency: Secondary | ICD-10-CM | POA: Diagnosis present

## 2024-04-25 DIAGNOSIS — N3289 Other specified disorders of bladder: Secondary | ICD-10-CM | POA: Diagnosis present

## 2024-04-25 DIAGNOSIS — K5900 Constipation, unspecified: Secondary | ICD-10-CM

## 2024-04-25 DIAGNOSIS — F0284 Dementia in other diseases classified elsewhere, unspecified severity, with anxiety: Secondary | ICD-10-CM | POA: Diagnosis present

## 2024-04-25 DIAGNOSIS — F02C11 Dementia in other diseases classified elsewhere, severe, with agitation: Secondary | ICD-10-CM | POA: Diagnosis present

## 2024-04-25 DIAGNOSIS — R456 Violent behavior: Secondary | ICD-10-CM | POA: Diagnosis not present

## 2024-04-25 DIAGNOSIS — R338 Other retention of urine: Secondary | ICD-10-CM | POA: Diagnosis present

## 2024-04-25 DIAGNOSIS — R339 Retention of urine, unspecified: Secondary | ICD-10-CM | POA: Diagnosis not present

## 2024-04-25 DIAGNOSIS — K5641 Fecal impaction: Secondary | ICD-10-CM | POA: Diagnosis present

## 2024-04-25 DIAGNOSIS — I4510 Unspecified right bundle-branch block: Secondary | ICD-10-CM | POA: Diagnosis present

## 2024-04-25 DIAGNOSIS — N401 Enlarged prostate with lower urinary tract symptoms: Secondary | ICD-10-CM | POA: Diagnosis present

## 2024-04-25 DIAGNOSIS — Z8546 Personal history of malignant neoplasm of prostate: Secondary | ICD-10-CM

## 2024-04-25 DIAGNOSIS — Z820 Family history of epilepsy and other diseases of the nervous system: Secondary | ICD-10-CM

## 2024-04-25 DIAGNOSIS — R4689 Other symptoms and signs involving appearance and behavior: Principal | ICD-10-CM

## 2024-04-25 DIAGNOSIS — Z8249 Family history of ischemic heart disease and other diseases of the circulatory system: Secondary | ICD-10-CM

## 2024-04-25 DIAGNOSIS — Z66 Do not resuscitate: Secondary | ICD-10-CM | POA: Diagnosis present

## 2024-04-25 DIAGNOSIS — F02818 Dementia in other diseases classified elsewhere, unspecified severity, with other behavioral disturbance: Secondary | ICD-10-CM | POA: Diagnosis present

## 2024-04-25 DIAGNOSIS — R918 Other nonspecific abnormal finding of lung field: Secondary | ICD-10-CM | POA: Diagnosis present

## 2024-04-25 DIAGNOSIS — Z9079 Acquired absence of other genital organ(s): Secondary | ICD-10-CM

## 2024-04-25 DIAGNOSIS — F4310 Post-traumatic stress disorder, unspecified: Secondary | ICD-10-CM | POA: Diagnosis present

## 2024-04-25 DIAGNOSIS — I679 Cerebrovascular disease, unspecified: Secondary | ICD-10-CM | POA: Diagnosis not present

## 2024-04-25 DIAGNOSIS — Z87891 Personal history of nicotine dependence: Secondary | ICD-10-CM

## 2024-04-25 LAB — CBC WITH DIFFERENTIAL/PLATELET
Abs Immature Granulocytes: 0.02 K/uL (ref 0.00–0.07)
Basophils Absolute: 0 K/uL (ref 0.0–0.1)
Basophils Relative: 1 %
Eosinophils Absolute: 0.2 K/uL (ref 0.0–0.5)
Eosinophils Relative: 3 %
HCT: 40 % (ref 39.0–52.0)
Hemoglobin: 12.3 g/dL — ABNORMAL LOW (ref 13.0–17.0)
Immature Granulocytes: 0 %
Lymphocytes Relative: 17 %
Lymphs Abs: 1.2 K/uL (ref 0.7–4.0)
MCH: 27.9 pg (ref 26.0–34.0)
MCHC: 30.8 g/dL (ref 30.0–36.0)
MCV: 90.7 fL (ref 80.0–100.0)
Monocytes Absolute: 0.5 K/uL (ref 0.1–1.0)
Monocytes Relative: 7 %
Neutro Abs: 5.3 K/uL (ref 1.7–7.7)
Neutrophils Relative %: 72 %
Platelets: 230 K/uL (ref 150–400)
RBC: 4.41 MIL/uL (ref 4.22–5.81)
RDW: 14.8 % (ref 11.5–15.5)
WBC: 7.4 K/uL (ref 4.0–10.5)
nRBC: 0 % (ref 0.0–0.2)

## 2024-04-25 LAB — COMPREHENSIVE METABOLIC PANEL WITH GFR
ALT: 15 U/L (ref 0–44)
AST: 17 U/L (ref 15–41)
Albumin: 4.1 g/dL (ref 3.5–5.0)
Alkaline Phosphatase: 82 U/L (ref 38–126)
Anion gap: 10 (ref 5–15)
BUN: 21 mg/dL (ref 8–23)
CO2: 27 mmol/L (ref 22–32)
Calcium: 9.6 mg/dL (ref 8.9–10.3)
Chloride: 103 mmol/L (ref 98–111)
Creatinine, Ser: 0.83 mg/dL (ref 0.61–1.24)
GFR, Estimated: >60 mL/min (ref >60)
Glucose, Bld: 89 mg/dL (ref 70–99)
Potassium: 4.7 mmol/L (ref 3.5–5.1)
Sodium: 140 mmol/L (ref 135–145)
Total Bilirubin: 0.6 mg/dL (ref 0.0–1.2)
Total Protein: 7.2 g/dL (ref 6.5–8.1)

## 2024-04-25 LAB — SALICYLATE LEVEL: Salicylate Lvl: <7 mg/dL — ABNORMAL LOW (ref 7.0–30.0)

## 2024-04-25 LAB — URINE DRUG SCREEN
Amphetamines: NEGATIVE
Barbiturates: NEGATIVE
Benzodiazepines: NEGATIVE
Cocaine: NEGATIVE
Fentanyl: NEGATIVE
Methadone Scn, Ur: NEGATIVE
Opiates: NEGATIVE
Tetrahydrocannabinol: NEGATIVE

## 2024-04-25 LAB — ETHANOL: Alcohol, Ethyl (B): <15 mg/dL (ref <15)

## 2024-04-25 LAB — ACETAMINOPHEN LEVEL: Acetaminophen (Tylenol), Serum: <10 ug/mL — ABNORMAL LOW (ref 10–30)

## 2024-04-25 MED ORDER — AMLODIPINE BESYLATE 10 MG PO TABS
5.0000 mg | ORAL_TABLET | Freq: Every day | ORAL | Status: DC
  Administered 2024-04-25: 5 mg via ORAL
  Filled 2024-04-25: qty 1

## 2024-04-25 MED ORDER — LOSARTAN POTASSIUM 50 MG PO TABS
100.0000 mg | ORAL_TABLET | Freq: Once | ORAL | Status: AC
  Administered 2024-04-25: 100 mg via ORAL
  Filled 2024-04-25: qty 2

## 2024-04-25 MED ORDER — LABETALOL HCL 5 MG/ML IV SOLN
10.0000 mg | Freq: Once | INTRAVENOUS | Status: AC
  Administered 2024-04-25: 10 mg via INTRAVENOUS
  Filled 2024-04-25: qty 4

## 2024-04-25 MED ORDER — IOHEXOL 350 MG/ML SOLN
100.0000 mL | Freq: Once | INTRAVENOUS | Status: AC | PRN
  Administered 2024-04-25: 100 mL via INTRAVENOUS

## 2024-04-25 NOTE — Progress Notes (Signed)
 [facility] Liaison Note   Mr Julian [name] is a current patient with [facility] with a terminal diagnosis of cerebrovascular disease and Alzheimer's dementia. He was transferred to the ED from [facility]. We will continue to follow.   Please call with any hospice questions or concerns.   Greig Basket, BSN, RN [facility] Liaison [phone]
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
## 2024-04-25 NOTE — BH Assessment (Signed)
 Patient was deferred to [facility]IS for a telepsych assessment. The assigned care coordinator will provide updates regarding the scheduling of the assessment. IRIS coordinator can be reached at [phone] for further information on the timing of the telepsych evaluation.

## 2024-04-26 ENCOUNTER — Encounter (HOSPITAL_COMMUNITY): Payer: Self-pay | Admitting: Family Medicine

## 2024-04-26 DIAGNOSIS — K5289 Other specified noninfective gastroenteritis and colitis: Secondary | ICD-10-CM | POA: Diagnosis not present

## 2024-04-26 DIAGNOSIS — E039 Hypothyroidism, unspecified: Secondary | ICD-10-CM | POA: Diagnosis not present

## 2024-04-26 DIAGNOSIS — I160 Hypertensive urgency: Secondary | ICD-10-CM | POA: Diagnosis not present

## 2024-04-26 DIAGNOSIS — R911 Solitary pulmonary nodule: Secondary | ICD-10-CM

## 2024-04-26 DIAGNOSIS — I679 Cerebrovascular disease, unspecified: Secondary | ICD-10-CM | POA: Diagnosis not present

## 2024-04-26 DIAGNOSIS — G309 Alzheimer's disease, unspecified: Secondary | ICD-10-CM | POA: Diagnosis not present

## 2024-04-26 DIAGNOSIS — F03918 Unspecified dementia, unspecified severity, with other behavioral disturbance: Secondary | ICD-10-CM | POA: Diagnosis not present

## 2024-04-26 DIAGNOSIS — R338 Other retention of urine: Secondary | ICD-10-CM | POA: Diagnosis not present

## 2024-04-26 DIAGNOSIS — I10 Essential (primary) hypertension: Secondary | ICD-10-CM | POA: Diagnosis not present

## 2024-04-26 DIAGNOSIS — C8589 Other specified types of non-Hodgkin lymphoma, extranodal and solid organ sites: Secondary | ICD-10-CM | POA: Diagnosis not present

## 2024-04-26 DIAGNOSIS — E785 Hyperlipidemia, unspecified: Secondary | ICD-10-CM | POA: Diagnosis not present

## 2024-04-26 LAB — CBC
HCT: 42.3 % (ref 39.0–52.0)
Hemoglobin: 12.7 g/dL — ABNORMAL LOW (ref 13.0–17.0)
MCH: 26.8 pg (ref 26.0–34.0)
MCHC: 30 g/dL (ref 30.0–36.0)
MCV: 89.4 fL (ref 80.0–100.0)
Platelets: 248 K/uL (ref 150–400)
RBC: 4.73 MIL/uL (ref 4.22–5.81)
RDW: 14.7 % (ref 11.5–15.5)
WBC: 8.4 K/uL (ref 4.0–10.5)
nRBC: 0 % (ref 0.0–0.2)

## 2024-04-26 LAB — URINALYSIS, ROUTINE W REFLEX MICROSCOPIC
Bilirubin Urine: NEGATIVE
Glucose, UA: NEGATIVE mg/dL
Ketones, ur: NEGATIVE mg/dL
Nitrite: NEGATIVE
Protein, ur: NEGATIVE mg/dL
RBC / HPF: >50 RBC/hpf (ref 0–5)
Specific Gravity, Urine: 1.026 (ref 1.005–1.030)
pH: 5 (ref 5.0–8.0)

## 2024-04-26 LAB — VITAMIN B12: Vitamin B-12: 1500 pg/mL — ABNORMAL HIGH (ref 180–914)

## 2024-04-26 LAB — BASIC METABOLIC PANEL WITH GFR
Anion gap: 11 (ref 5–15)
BUN: 16 mg/dL (ref 8–23)
CO2: 28 mmol/L (ref 22–32)
Calcium: 9.6 mg/dL (ref 8.9–10.3)
Chloride: 100 mmol/L (ref 98–111)
Creatinine, Ser: 0.79 mg/dL (ref 0.61–1.24)
GFR, Estimated: >60 mL/min (ref >60)
Glucose, Bld: 103 mg/dL — ABNORMAL HIGH (ref 70–99)
Potassium: 3.9 mmol/L (ref 3.5–5.1)
Sodium: 139 mmol/L (ref 135–145)

## 2024-04-26 LAB — VALPROIC ACID LEVEL: Valproic Acid Lvl: 24 ug/mL — ABNORMAL LOW (ref 50–100)

## 2024-04-26 LAB — MRSA NEXT GEN BY PCR, NASAL: MRSA by PCR Next Gen: NOT DETECTED

## 2024-04-26 LAB — PROCALCITONIN: Procalcitonin: <0.1 ng/mL

## 2024-04-26 LAB — PRO BRAIN NATRIURETIC PEPTIDE: Pro Brain Natriuretic Peptide: 520 pg/mL — ABNORMAL HIGH (ref <300.0)

## 2024-04-26 MED ORDER — ATORVASTATIN CALCIUM 20 MG PO TABS: 20.0000 mg | ORAL_TABLET | Freq: Every day | ORAL | Status: DC

## 2024-04-26 MED ORDER — CHLORHEXIDINE GLUCONATE CLOTH 2 % EX PADS
6.0000 | MEDICATED_PAD | Freq: Every day | CUTANEOUS | Status: DC
  Administered 2024-04-26 – 2024-05-01 (×5): 6 via TOPICAL

## 2024-04-26 MED ORDER — ACETAMINOPHEN 650 MG RE SUPP
650.0000 mg | Freq: Four times a day (QID) | RECTAL | Status: DC | PRN
Start: 2024-04-26 — End: 2024-05-21

## 2024-04-26 MED ORDER — QUETIAPINE FUMARATE 50 MG PO TABS
50.0000 mg | ORAL_TABLET | Freq: Three times a day (TID) | ORAL | Status: DC
  Administered 2024-04-26 – 2024-04-27 (×6): 50 mg via ORAL
  Filled 2024-04-26 (×7): qty 1

## 2024-04-26 MED ORDER — LORAZEPAM 0.5 MG PO TABS
0.5000 mg | ORAL_TABLET | Freq: Three times a day (TID) | ORAL | Status: DC | PRN
  Administered 2024-04-26 – 2024-05-20 (×22): 0.5 mg via ORAL
  Filled 2024-04-26 (×26): qty 1

## 2024-04-26 MED ORDER — DIVALPROEX SODIUM 125 MG PO CSDR
250.0000 mg | DELAYED_RELEASE_CAPSULE | Freq: Two times a day (BID) | ORAL | Status: DC
  Administered 2024-04-26 – 2024-05-21 (×49): 250 mg via ORAL
  Filled 2024-04-26 (×51): qty 2

## 2024-04-26 MED ORDER — LABETALOL HCL 5 MG/ML IV SOLN
20.0000 mg | Freq: Once | INTRAVENOUS | Status: AC
  Administered 2024-04-26: 20 mg via INTRAVENOUS
  Filled 2024-04-26: qty 4

## 2024-04-26 MED ORDER — LOSARTAN POTASSIUM 50 MG PO TABS
100.0000 mg | ORAL_TABLET | Freq: Every day | ORAL | Status: DC
  Administered 2024-04-26 – 2024-05-21 (×25): 100 mg via ORAL
  Filled 2024-04-26 (×26): qty 2

## 2024-04-26 MED ORDER — BISACODYL 10 MG RE SUPP
10.0000 mg | Freq: Once | RECTAL | Status: AC
  Administered 2024-04-26: 10 mg via RECTAL
  Filled 2024-04-26: qty 1

## 2024-04-26 MED ORDER — HYDRALAZINE HCL 25 MG PO TABS
25.0000 mg | ORAL_TABLET | Freq: Four times a day (QID) | ORAL | Status: DC | PRN
  Administered 2024-05-03: 25 mg via ORAL
  Filled 2024-04-26: qty 1

## 2024-04-26 MED ORDER — POLYETHYLENE GLYCOL 3350 17 G PO PACK
17.0000 g | PACK | Freq: Two times a day (BID) | ORAL | Status: DC
  Administered 2024-04-26 – 2024-05-01 (×9): 17 g via ORAL
  Filled 2024-04-26 (×11): qty 1

## 2024-04-26 MED ORDER — LABETALOL HCL 5 MG/ML IV SOLN
10.0000 mg | INTRAVENOUS | Status: DC | PRN
  Administered 2024-05-03: 10 mg via INTRAVENOUS
  Filled 2024-04-26: qty 4

## 2024-04-26 MED ORDER — ENOXAPARIN SODIUM 40 MG/0.4ML IJ SOSY
40.0000 mg | PREFILLED_SYRINGE | INTRAMUSCULAR | Status: DC
  Administered 2024-04-26 – 2024-05-21 (×25): 40 mg via SUBCUTANEOUS
  Filled 2024-04-26 (×25): qty 0.4

## 2024-04-26 MED ORDER — LORAZEPAM 0.5 MG PO TABS
0.5000 mg | ORAL_TABLET | Freq: Every day | ORAL | Status: DC
  Administered 2024-04-27 – 2024-05-20 (×21): 0.5 mg via ORAL
  Filled 2024-04-26 (×18): qty 1

## 2024-04-26 MED ORDER — ENSURE PLUS HIGH PROTEIN PO LIQD
237.0000 mL | Freq: Two times a day (BID) | ORAL | Status: DC
  Administered 2024-04-27 – 2024-05-21 (×44): 237 mL via ORAL

## 2024-04-26 MED ORDER — SODIUM CHLORIDE 0.9% FLUSH
3.0000 mL | Freq: Two times a day (BID) | INTRAVENOUS | Status: DC
  Administered 2024-04-26 – 2024-05-21 (×50): 3 mL via INTRAVENOUS

## 2024-04-26 MED ORDER — LACTATED RINGERS IV SOLN: INTRAVENOUS | Status: DC

## 2024-04-26 MED ORDER — SENNOSIDES-DOCUSATE SODIUM 8.6-50 MG PO TABS: 1.0000 | ORAL_TABLET | Freq: Every evening | ORAL | Status: DC | PRN

## 2024-04-26 MED ORDER — ACETAMINOPHEN 325 MG PO TABS
650.0000 mg | ORAL_TABLET | Freq: Four times a day (QID) | ORAL | Status: DC | PRN
  Administered 2024-04-26 – 2024-05-20 (×6): 650 mg via ORAL
  Filled 2024-04-26 (×7): qty 2

## 2024-04-26 MED ORDER — CITALOPRAM HYDROBROMIDE 20 MG PO TABS
20.0000 mg | ORAL_TABLET | Freq: Every day | ORAL | Status: DC
Start: 2024-04-26 — End: 2024-05-21
  Administered 2024-04-26 – 2024-05-20 (×25): 20 mg via ORAL
  Filled 2024-04-26 (×26): qty 1

## 2024-04-26 MED ORDER — LEVOTHYROXINE SODIUM 50 MCG PO TABS
75.0000 ug | ORAL_TABLET | Freq: Every day | ORAL | Status: DC
  Administered 2024-04-26 – 2024-05-20 (×20): 75 ug via ORAL
  Filled 2024-04-26 (×24): qty 1

## 2024-04-26 MED ORDER — PROCHLORPERAZINE EDISYLATE 10 MG/2ML IJ SOLN: 5.0000 mg | Freq: Four times a day (QID) | INTRAMUSCULAR | Status: DC | PRN

## 2024-04-26 MED ORDER — TAMSULOSIN HCL 0.4 MG PO CAPS
0.4000 mg | ORAL_CAPSULE | Freq: Every day | ORAL | Status: DC
  Administered 2024-04-27 – 2024-05-17 (×20): 0.4 mg via ORAL
  Filled 2024-04-26 (×20): qty 1

## 2024-04-26 MED ORDER — AMLODIPINE BESYLATE 5 MG PO TABS
2.5000 mg | ORAL_TABLET | Freq: Every day | ORAL | Status: DC
  Administered 2024-04-26 – 2024-05-16 (×20): 2.5 mg via ORAL
  Filled 2024-04-26 (×20): qty 1

## 2024-04-26 MED ORDER — SENNOSIDES-DOCUSATE SODIUM 8.6-50 MG PO TABS
1.0000 | ORAL_TABLET | Freq: Two times a day (BID) | ORAL | Status: DC
  Administered 2024-04-26 – 2024-05-20 (×44): 1 via ORAL
  Filled 2024-04-26 (×44): qty 1

## 2024-04-26 NOTE — Progress Notes (Signed)
 Patient arrived to the unit calm, cooperative, and A&Ox1. Patient is now A&Ox0, agitated, pulling wires, and attempting to get out of bed. Sitter at the bedside. On coming RN notified.

## 2024-04-26 NOTE — H&P (Signed)
 History and Physical    Julian [name] FMW:[id number] DOB: 02-14-1926 DOA: 04/25/2024  PCP: Julian [name] Julian Mickey., MD   Patient coming from: SNF   Chief Complaint: Increased agitation   HPI: Julian [name] is a 89 y.o. male with medical history significant for Alzheimer dementia with behavioral disturbance, hypertension, hyperlipidemia, and hypothyroidism who presents for evaluation of increased agitation.  Personnel at his nursing facility report that he has had increased agitation and was combative yesterday.  Patient has had no complaints in the ED and does not recall being agitated or aggressive.  ED Course: Upon arrival to the ED, patient is found to be afebrile and saturating well on room air with normal HR and elevated BP.  Labs are most notable for normal creatinine, normal WBC, and negative tox screen.  There are no acute findings on chest x-ray or head CT.  CT of the chest, abdomen, and pelvis is notable for stercoral colitis and pulmonary nodules.  Patient was treated in the ED with losartan , amlodipine , and 2 doses of labetalol .  He had over a liter of urine in his bladder and Foley catheter was inserted.  Psychiatry was consulted by the ED PA.  Review of Systems:  All other systems reviewed and apart from HPI, are negative.  Past Medical History:  Diagnosis Date   Alzheimer's dementia (HCC)    Anemia    Dementia (HCC)    Hyperlipidemia    Hypertension    Prostate cancer (HCC)     Past Surgical History:  Procedure Laterality Date   CARPAL TUNNEL RELEASE Left    HERNIA REPAIR     MID 80'S   KNEE SURGERY Left    PROSTATECTOMY  2001    Social History:   reports that he quit smoking about 42 years ago. His smoking use included cigarettes. He has never used smokeless tobacco. He reports current alcohol  use. He reports that he does not use drugs.  No Known Allergies  Family History  Problem Relation Age of Onset   CAD Mother    CAD Father    Dementia Sister    Heart  attack Sister    Alzheimer's disease Brother      Prior to Admission medications   Medication Sig Start Date End Date Taking? Authorizing Provider  Cholecalciferol (VITAMIN D3) 125 MCG (5000 UT) CAPS Take 5,000 Units by mouth daily.   Yes [provider]  citalopram  (CELEXA ) 20 MG tablet Take 20 mg by mouth at bedtime.   Yes [provider]  divalproex  (DEPAKOTE  SPRINKLE) 125 MG capsule Take 250 mg by mouth in the morning and at bedtime.   Yes [provider]  fluticasone (FLONASE) 50 MCG/ACT nasal spray Place 2 sprays into both nostrils at bedtime.   Yes [provider]  levothyroxine  (SYNTHROID ) 50 MCG tablet Take 50 mcg by mouth daily before breakfast.   Yes [provider]  LORazepam  (ATIVAN ) 0.5 MG tablet Take 0.5 mg by mouth See admin instructions. Take 0.5 mg by mouth at 2 PM and an additional 0.5 mg every 8 hours as needed for agitation 07/20/23  Yes [provider]  losartan  (COZAAR ) 100 MG tablet TAKE 1 TABLET BY MOUTH DAILY Patient taking differently: Take 100 mg by mouth in the morning. 12/16/22  Yes Nahser, Aleene PARAS, MD  Multiple Vitamins-Minerals (CENTRUM SILVER PO) Take 1 tablet by mouth daily with breakfast.   Yes [provider]  mupirocin ointment (BACTROBAN) 2 % Apply 1 Application topically See admin  instructions. One application to affected arm once daily as needed for skin tears   Yes [provider]  Propylene Glycol (SYSTANE BALANCE) 0.6 % SOLN Place 1 drop into both eyes in the morning.   Yes [provider]  QUEtiapine  (SEROQUEL ) 50 MG tablet Take 50 mg by mouth 3 (three) times daily.   Yes [provider]  amLODipine  (NORVASC ) 2.5 MG tablet TAKE 1 TABLET BY MOUTH DAILY 12/29/23   Nahser, Aleene PARAS, MD  atorvastatin  (LIPITOR) 20 MG tablet Take 20 mg by mouth at bedtime. 07/07/23   [provider]  azithromycin  (ZITHROMAX ) 100 MG/5ML suspension 500 mg (25mL) on day one,  followed by 250 mg (12.27mL) on days 2,3,4, and 5. 10/10/23   Cardama, Raynell Moder, MD    Physical Exam: Vitals:   04/25/24 2301 04/26/24 0002 04/26/24 0101 04/26/24 0230  BP: (!) 175/69 (!) 140/97 (!) 142/80 (!) 235/90  Pulse: 66 67 68 66  Resp: 20 19 18 17   Temp:    98.2 F (36.8 C)  TempSrc:    Oral  SpO2:  95% 98% 100%    Constitutional: NAD, calm  Eyes: PERTLA, lids and conjunctivae normal ENMT: Mucous membranes are moist. Posterior pharynx clear of any exudate or lesions.   Neck: supple, no masses  Respiratory: no wheezing, no crackles. No accessory muscle use.  Cardiovascular: S1 & S2 heard, regular rate and rhythm. No extremity edema.   Abdomen: No tenderness, soft. Bowel sounds active.  Musculoskeletal: no clubbing / cyanosis. No joint deformity upper and lower extremities.   Skin: no significant rashes, lesions, ulcers. Warm, dry, well-perfused. Neurologic: CN 2-12 grossly intact. Moving all extremities. Alert and oriented to person only.  Psychiatric: Pleasant. Cooperative.    Labs and Imaging on Admission: I have personally reviewed following labs and imaging studies  CBC: Recent Labs  Lab 04/25/24 1627  WBC 7.4  NEUTROABS 5.3  HGB 12.3*  HCT 40.0  MCV 90.7  PLT 230   Basic Metabolic Panel: Recent Labs  Lab 04/25/24 1627  NA 140  K 4.7  CL 103  CO2 27  GLUCOSE 89  BUN 21  CREATININE 0.83  CALCIUM  9.6   GFR: CrCl cannot be calculated (Unknown ideal weight.). Liver Function Tests: Recent Labs  Lab 04/25/24 1627  AST 17  ALT 15  ALKPHOS 82  BILITOT 0.6  PROT 7.2  ALBUMIN 4.1   No results for input(s): LIPASE, AMYLASE in the last 168 hours. No results for input(s): AMMONIA in the last 168 hours. Coagulation Profile: No results for input(s): INR, PROTIME in the last 168 hours. Cardiac Enzymes: No results for input(s): CKTOTAL, CKMB, CKMBINDEX, TROPONINI in the last 168 hours. BNP (last 3 results) No results for  input(s): PROBNP in the last 8760 hours. HbA1C: No results for input(s): HGBA1C in the last 72 hours. CBG: No results for input(s): GLUCAP in the last 168 hours. Lipid Profile: No results for input(s): CHOL, HDL, LDLCALC, TRIG, CHOLHDL, LDLDIRECT in the last 72 hours. Thyroid  Function Tests: No results for input(s): TSH, T4TOTAL, FREET4, T3FREE, THYROIDAB in the last 72 hours. Anemia Panel: No results for input(s): VITAMINB12, FOLATE, FERRITIN, TIBC, IRON, RETICCTPCT in the last 72 hours. Urine analysis:    Component Value Date/Time   COLORURINE YELLOW 10/09/2023 2325   APPEARANCEUR CLEAR 10/09/2023 2325   LABSPEC 1.019 10/09/2023 2325   PHURINE 5.0 10/09/2023 2325   GLUCOSEU NEGATIVE 10/09/2023 2325   HGBUR NEGATIVE 10/09/2023 2325   BILIRUBINUR NEGATIVE 10/09/2023 2325  KETONESUR 5 (A) 10/09/2023 2325   PROTEINUR NEGATIVE 10/09/2023 2325   NITRITE NEGATIVE 10/09/2023 2325   LEUKOCYTESUR NEGATIVE 10/09/2023 2325   Sepsis Labs: @LABRCNTIP (procalcitonin:4,lacticidven:4) )No results found for this or any previous visit (from the past 240 hours).   Radiological Exams on Admission: CT Angio Chest/Abd/Pel for Dissection W and/or Wo Contrast Result Date: 04/26/2024 CLINICAL DATA:  Evaluate for dissection. EXAM: CT ANGIOGRAPHY CHEST, ABDOMEN AND PELVIS TECHNIQUE: Non-contrast CT of the chest was initially obtained. Multidetector CT imaging through the chest, abdomen and pelvis was performed using the standard protocol during bolus administration of intravenous contrast. Multiplanar reconstructed images and MIPs were obtained and reviewed to evaluate the vascular anatomy. RADIATION DOSE REDUCTION: This exam was performed according to the departmental dose-optimization program which includes automated exposure control, adjustment of the mA and/or kV according to patient size and/or use of iterative reconstruction technique. CONTRAST:  OMNIPAQUE   IOHEXOL  350 MG/ML SOLN COMPARISON:  None Available. FINDINGS: CTA CHEST FINDINGS Cardiovascular: Preferential opacification of the thoracic aorta. No evidence of thoracic aortic aneurysm or dissection. Normal heart size. No pericardial effusion. There severe atherosclerotic calcifications of the aorta. Origin of the great vessels appears patent. There is no central pulmonary embolism. Mediastinum/Nodes: No enlarged mediastinal, hilar, or axillary lymph nodes. Thyroid  gland, trachea, and esophagus demonstrate no significant findings. Lungs/Pleura: There are ground-glass opacities with associated interstitial opacities in the dependent portions of the bilateral lower lobes and right upper lobe. There are trace bilateral pleural effusions. There is scarring in both lung apices. Peripheral slightly nodular density in the left upper lobe measures 7 mm image 8/24. There is a 3 mm right upper lobe nodule image 8/42. There is a 3 mm nodule in the left upper lobe image 8/75. Trachea and central airways are patent. Musculoskeletal: No acute osseous abnormality. There is bilateral gynecomastia. Review of the MIP images confirms the above findings. CTA ABDOMEN AND PELVIS FINDINGS VASCULAR Aorta: Normal caliber aorta without aneurysm, dissection, vasculitis or significant stenosis. There severe atherosclerotic calcifications of the aorta. Celiac: Patent without evidence of aneurysm, dissection, vasculitis or significant stenosis. SMA: Patent without evidence of aneurysm, dissection, vasculitis or significant stenosis. Renals: Both renal arteries are patent without evidence of aneurysm, dissection, vasculitis, fibromuscular dysplasia or significant stenosis. There is mild stenosis of the origin of the right renal artery secondary to calcified atherosclerotic disease. IMA: Patent without evidence of aneurysm, dissection, vasculitis or significant stenosis. There is moderate focal stenosis of the origin. Inflow: Patent without  evidence of aneurysm, dissection, vasculitis or significant stenosis. Calcified atherosclerotic disease present. Veins: No obvious venous abnormality within the limitations of this arterial phase study. Review of the MIP images confirms the above findings. NON-VASCULAR Hepatobiliary: No focal liver abnormality is seen. No gallstones, gallbladder wall thickening, or biliary dilatation. Pancreas: Unremarkable. No pancreatic ductal dilatation or surrounding inflammatory changes. Spleen: Normal in size without focal abnormality. Calcified granulomas are present. Adrenals/Urinary Tract: Small left peripelvic cysts are present. There is no definite hydronephrosis. No focal lesions are identified. The adrenal glands are within normal limits. The bladder is decompressed by Foley catheter. Stomach/Bowel: Rectum is dilated and stool-filled measuring up to 10 cm. There is rectal wall thickening. There is also large amount of stool within the transverse colon, ascending colon and sigmoid colon. There is no bowel obstruction, pneumatosis, focal inflammation or free air. Sigmoid colon diverticula are present. The appendix is not seen. Small bowel and stomach are within normal limits. Lymphatic: No enlarged lymph nodes are seen. Reproductive: Prostate  is unremarkable. Other: No abdominal wall hernia or abnormality. No abdominopelvic ascites. Musculoskeletal: Degenerative changes affect the spine. There is levoconvex curvature of the lumbar spine. Review of the MIP images confirms the above findings. IMPRESSION: 1. No evidence for aortic dissection or aneurysm. 2. Severe atherosclerotic calcifications of the aorta. 3. Ground-glass and interstitial opacities in the dependent portions of the bilateral lower lobes and right upper lobe with trace bilateral pleural effusions. Findings may be related to pulmonary edema or infection. 4. Multiple pulmonary nodules. Most severe: 7 mm left solid pulmonary nodule within the upper lobe.  Non-contrast chest CT at 3-6 months is recommended. If the nodules are stable at time of repeat CT, then future CT at 18-24 months (from today's scan) is considered optional for low-risk patients, but is recommended for high-risk patients. This recommendation follows the consensus statement: Guidelines for Management of Incidental Pulmonary Nodules Detected on CT Images: From the Fleischner Society 2017; Radiology 2017; 284:228-243. 5. Dilated stool-filled rectum with rectal wall thickening. Findings are compatible with fecal impaction and stercoral colitis. 6. Colonic diverticulosis. 7. Aortic atherosclerosis. Aortic Atherosclerosis (ICD10-I70.0). Electronically Signed   By: Greig Pique M.D.   On: 04/26/2024 00:06   CT Head Wo Contrast Result Date: 04/25/2024 CLINICAL DATA:  Mental status change, unknown cause EXAM: CT HEAD WITHOUT CONTRAST TECHNIQUE: Contiguous axial images were obtained from the base of the skull through the vertex without intravenous contrast. RADIATION DOSE REDUCTION: This exam was performed according to the departmental dose-optimization program which includes automated exposure control, adjustment of the mA and/or kV according to patient size and/or use of iterative reconstruction technique. COMPARISON:  Head CT 09/12/2023 FINDINGS: Brain: No intracranial hemorrhage, mass effect, or midline shift. No hydrocephalus. The basilar cisterns are patent. Stable degree of atrophy and chronic small vessel ischemia. Again seen remote lacunar infarcts in bilateral caudate and right basal ganglia. No evidence of territorial infarct or acute ischemia. No extra-axial or intracranial fluid collection. Vascular: Atherosclerosis of skullbase vasculature without hyperdense vessel or abnormal calcification. Skull: No fracture or focal lesion. Sinuses/Orbits: Chronic mucosal thickening within the anterior paranasal sinuses. No sinus fluid levels. No mastoid effusion. Other: None. IMPRESSION: 1. No acute  intracranial abnormality. 2. Stable atrophy, chronic small vessel ischemia, and remote lacunar infarcts. Electronically Signed   By: Andrea Gasman M.D.   On: 04/25/2024 18:02   DG Chest 1 View Result Date: 04/25/2024 CLINICAL DATA:  Altered mental status. EXAM: CHEST  1 VIEW COMPARISON:  10/09/2023 FINDINGS: Stable poor inspiration, borderline enlargement of the cardiac silhouette and mild elevation of the left hemidiaphragm. Clear lungs with normal vascularity. No acute bony abnormality. IMPRESSION: No acute abnormality. Electronically Signed   By: Elspeth Bathe M.D.   On: 04/25/2024 16:34    EKG: Independently reviewed. Sinus rhythm, RBBB.   Assessment/Plan  1. Severe asymptomatic hypertension  - BP severely elevated without target organ damage; remains elevated despite treatment of urinary retention  - Continue Norvasc  and losartan , use hydralazine  or labetalol  as-needed    2. Urinary retention  - Foley placed in ED with >1 liter out immediately  - Continue Foley for now    3. Stercoral colitis  - No impaction noted on DRE per ED PA  - Start bowel regimen   4. Dementia with behavioral disturbance  - Sent from his facility for increased agitation; he has been pleasant in the ED  - TTS consulted by ED - Continue Seroquel , Depakote , Celexa , and as-needed Ativan     5. Hypothyroidism  -  Continue Synthroid    6. Lung nodule  - Noted on CT in ED  - Could consider repeat CT in 3-6 months though likely unnecessary given his advanced age and dementia    DVT prophylaxis: Lovenox   Code Status: DNR Level of Care: Level of care: Progressive Family Communication: None present  Disposition Plan:  Patient is from: SNF  Anticipated d/c is to: TBD Anticipated d/c date is: 9/5 or 04/28/24  Patient currently: Pending BP-control, TTS consultation  Consults called: TTS  Admission status: Observation     Evalene GORMAN Sprinkles, MD [facility]  04/26/2024, 2:39 AM

## 2024-04-26 NOTE — Progress Notes (Signed)
 Patient's spouse contacted RN. Update on patient status provided. Spouse requested a phone call from attending MD. Request made to attending MD via secure chat.

## 2024-04-26 NOTE — Progress Notes (Signed)
 Julian [name] [address] [facility] Liaison Note   Julian [name] is a current patient with [facility] with a terminal diagnosis of cerebrovascular disease. Patient was at his facility on 09.03 when he became agitated and combative. He was taken to [facility] ED and was treated for urinary retention and elevated blood pressure. Patient was admitted 09.03.2025 for severe asymptomatic hypertension that was not relieved after Foley was placed. Psych evaluation has been ordered. Per Dr. Norleen Sella with [facility] this is a related hospital admission. Patient is a DNR.   Went to the bedside to evaluate the patient. He currently has a Comptroller and he appears to be sleeping.  Per sitter, patient was trying to get out of bed earlier but was not combative. She said that he ate some of his lunch prior to falling asleep.    Upon evaluation, Julian [name] does not currently meet criteria for inpatient hospice. Will continue to follow daily for any changes.     Patient remains inpatient appropriate for skilled monitoring, assessment and medication titration for complex symptom management.   V/S: 98, 137/70, 70, 16, Sats 96% on room air  I/O: 840/4050  Abnormal Labs: ProBNP 520, VitB 1500 Diagnostics: CT chest/abdomen showed multiple pulmonary nodules  IV/PRN medications: Ativan  0.5mg  PO daily and Q8H PRN x1, Compazine  5mg  IV push Q6H PRN xO   Recommendations/Plan per Dr. Darwyn progress note dated 09.04.2025  Assessment & Plan:   Principal Problem:   Hypertensive urgency Active Problems:   Dementia with behavioral disturbance (HCC)   Acute urinary retention   Stercoral colitis   Hypothyroidism   Lung nodule     1-Hypertensive urgency - Patient presented with a blood pressure 235/90 -Continue with Cozaar , Norvasc  -PRN hydralazine .  -Per wife Norvasc  --was discontinue by his cardiologist because BP was too low. Plan to monitor on low dose Norvasc . SBP this morning  was 200 range.      Pulmonary edema vs Infiltrate lower base lung:  - Check proBNP and procalcitonin level   Urinary retention - Patient presented with urinary retention after Foley catheter placed 1 L of yielding came out immediately. - Will need to be discharged with Foley catheter - Start Flomax      Stercoral colitis - No impaction noted on DRE per ED PA. - Started on bowel regimen: Miralax  BID, Senna  Will give him another suppository , asked nurse to  disimpact patient.      Dementia with behavioral disturbance Acute metabolic encephalopathy - Continue Depakote , Seroquel . - Suspect acute metabolic encephalopathy in the setting of urinary retention and hypertensive urgency, stercoral colitis.  -On Seroquel , Depakote .  -Psych consulted.    Hypothyroidism - Continue with Synthroid    Lung nodules -Needs CT chest in 3 to 6 months.        Estimated body mass index is 21.35 kg/m as calculated from the following:   Height as of this encounter: 5' 10 (1.778 m).   Weight as of this encounter: 67.5 kg.     DVT prophylaxis: Lovenox  Code Status: DNR Family Communication:Care discussed with Wife Disposition Plan:  Status is: Observation The patient remains OBS appropriate and will d/c before 2 midnights.       Consultants:  None   Procedures:  none   Antimicrobials:      Subjective: He is sleepy. Was agitated earlier.    Objective:       Vitals:    04/26/24 0002 04/26/24 0101 04/26/24 0230 04/26/24 0334  BP: ROLLEN)  140/97 (!) 142/80 (!) 235/90 (!) 119/94  Pulse: 67 68 66 67  Resp: 19 18 17 18   Temp:     98.2 F (36.8 C) 98 F (36.7 C)  TempSrc:     Oral    SpO2: 95% 98% 100% 95%  Weight:     67.5 kg    Height:     5' 10 (1.778 m)        Intake/Output Summary (Last 24 hours) at 04/26/2024 0744 Last data filed at 04/26/2024 0648    Gross per 24 hour  Intake 360 ml  Output 3600 ml  Net -3240 ml       Filed Weights    04/26/24 0230  Weight: 67.5 kg       Examination:   General exam: sleepy  Respiratory system: Clear to auscultation. Respiratory effort normal. Cardiovascular system: S1 & S2 heard, RRR.  Gastrointestinal system: Abdomen is nondistended, soft and nontender. Central nervous system: sleepy  Extremities: no edema     Data Reviewed: I have personally reviewed following labs and imaging studies   CBC: Last Labs      Recent Labs  Lab 04/25/24 1627 04/26/24 0407  WBC 7.4 8.4  NEUTROABS 5.3  --   HGB 12.3 12.7  HCT 40.0 42.3  MCV 90.7 89.4  PLT 230 248      Basic Metabolic Panel: Last Labs      Recent Labs  Lab 04/25/24 1627 04/26/24 0407  NA 140 139  K 4.7 3.9  CL 103 100  CO2 27 28  GLUCOSE 89 103  BUN 21 16  CREATININE 0.83 0.79  CALCIUM  9.6 9.6      GFR: Estimated Creatinine Clearance: 51.6 mL/min (by C-G formula based on SCr of 0.79 mg/dL). Liver Function Tests: Last Labs     Recent Labs  Lab 04/25/24 1627  AST 17  ALT 15  ALKPHOS 82  BILITOT 0.6  PROT 7.2  ALBUMIN 4.1      Last Labs  No results for input(s): LIPASE, AMYLASE in the last 168 hours.   Last Labs  No results for input(s): AMMONIA in the last 168 hours.   Coagulation Profile: Last Labs  No results for input(s): INR, PROTIME in the last 168 hours.   Cardiac Enzymes: Last Labs  No results for input(s): CKTOTAL, CKMB, CKMBINDEX, TROPONINI in the last 168 hours.   BNP (last 3 results) Recent Labs (within last 365 days)  No results for input(s): PROBNP in the last 8760 hours.   HbA1C: Recent Labs (last 2 labs)  No results for input(s): HGBA1C in the last 72 hours.   CBG: Last Labs  No results for input(s): GLUCAP in the last 168 hours.   Lipid Profile: Recent Labs (last 2 labs)  No results for input(s): CHOL, HDL, LDLCALC, TRIG, CHOLHDL, LDLDIRECT in the last 72 hours.   Thyroid  Function Tests: Recent Labs (last 2 labs)  No results for input(s): TSH, T4TOTAL,  FREET4, T3FREE, THYROIDAB in the last 72 hours.   Anemia Panel: Recent Labs (last 2 labs)  No results for input(s): VITAMINB12, FOLATE, FERRITIN, TIBC, IRON, RETICCTPCT in the last 72 hours.   Sepsis Labs: Last Labs  No results for input(s): PROCALCITON, LATICACIDVEN in the last 168 hours.            Recent Results (from the past 240 hours)  MRSA Next Gen by PCR, Nasal     Status: None    Collection Time: 04/26/24  4:29  AM    Specimen: Nasal Mucosa; Nasal Swab  Result Value Ref Range Status    MRSA by PCR Next Gen NOT DETECTED NOT DETECTED Final      Comment: (NOTE) The GeneXpert MRSA Assay (FDA approved for NASAL specimens only), is one component of a comprehensive MRSA colonization surveillance program. It is not intended to diagnose MRSA infection nor to guide or monitor treatment for MRSA infections. Test performance is not FDA approved in patients less than 97 years old. Performed at [facility], [address], [place], [place] [address]                Radiology Studies:  Imaging Results (Last 48 hours)  CT Angio Chest/Abd/Pel for Dissection W and/or Wo Contrast Result Date: 04/26/2024 CLINICAL DATA:  Evaluate for dissection. EXAM: CT ANGIOGRAPHY CHEST, ABDOMEN AND PELVIS TECHNIQUE: Non-contrast CT of the chest was initially obtained. Multidetector CT imaging through the chest, abdomen and pelvis was performed using the standard protocol during bolus administration of intravenous contrast. Multiplanar reconstructed images and MIPs were obtained and reviewed to evaluate the vascular anatomy. RADIATION DOSE REDUCTION: This exam was performed according to the departmental dose-optimization program which includes automated exposure control, adjustment of the mA and/or kV according to patient size and/or use of iterative reconstruction technique. CONTRAST:  OMNIPAQUE  IOHEXOL  350 MG/ML SOLN COMPARISON:  None Available. FINDINGS: CTA  CHEST FINDINGS Cardiovascular: Preferential opacification of the thoracic aorta. No evidence of thoracic aortic aneurysm or dissection. Normal heart size. No pericardial effusion. There severe atherosclerotic calcifications of the aorta. Origin of the great vessels appears patent. There is no central pulmonary embolism. Mediastinum/Nodes: No enlarged mediastinal, hilar, or axillary lymph nodes. Thyroid  gland, trachea, and esophagus demonstrate no significant findings. Lungs/Pleura: There are ground-glass opacities with associated interstitial opacities in the dependent portions of the bilateral lower lobes and right upper lobe. There are trace bilateral pleural effusions. There is scarring in both lung apices. Peripheral slightly nodular density in the left upper lobe measures 7 mm image 8/24. There is a 3 mm right upper lobe nodule image 8/42. There is a 3 mm nodule in the left upper lobe image 8/75. Trachea and central airways are patent. Musculoskeletal: No acute osseous abnormality. There is bilateral gynecomastia. Review of the MIP images confirms the above findings. CTA ABDOMEN AND PELVIS FINDINGS VASCULAR Aorta: Normal caliber aorta without aneurysm, dissection, vasculitis or significant stenosis. There severe atherosclerotic calcifications of the aorta. Celiac: Patent without evidence of aneurysm, dissection, vasculitis or significant stenosis. SMA: Patent without evidence of aneurysm, dissection, vasculitis or significant stenosis. Renals: Both renal arteries are patent without evidence of aneurysm, dissection, vasculitis, fibromuscular dysplasia or significant stenosis. There is mild stenosis of the origin of the right renal artery secondary to calcified atherosclerotic disease. IMA: Patent without evidence of aneurysm, dissection, vasculitis or significant stenosis. There is moderate focal stenosis of the origin. Inflow: Patent without evidence of aneurysm, dissection, vasculitis or significant stenosis.  Calcified atherosclerotic disease present. Veins: No obvious venous abnormality within the limitations of this arterial phase study. Review of the MIP images confirms the above findings. NON-VASCULAR Hepatobiliary: No focal liver abnormality is seen. No gallstones, gallbladder wall thickening, or biliary dilatation. Pancreas: Unremarkable. No pancreatic ductal dilatation or surrounding inflammatory changes. Spleen: Normal in size without focal abnormality. Calcified granulomas are present. Adrenals/Urinary Tract: Small left peripelvic cysts are present. There is no definite hydronephrosis. No focal lesions are identified. The adrenal glands are within normal limits. The bladder is  decompressed by Foley catheter. Stomach/Bowel: Rectum is dilated and stool-filled measuring up to 10 cm. There is rectal wall thickening. There is also large amount of stool within the transverse colon, ascending colon and sigmoid colon. There is no bowel obstruction, pneumatosis, focal inflammation or free air. Sigmoid colon diverticula are present. The appendix is not seen. Small bowel and stomach are within normal limits. Lymphatic: No enlarged lymph nodes are seen. Reproductive: Prostate is unremarkable. Other: No abdominal wall hernia or abnormality. No abdominopelvic ascites. Musculoskeletal: Degenerative changes affect the spine. There is levoconvex curvature of the lumbar spine. Review of the MIP images confirms the above findings. IMPRESSION: 1. No evidence for aortic dissection or aneurysm. 2. Severe atherosclerotic calcifications of the aorta. 3. Ground-glass and interstitial opacities in the dependent portions of the bilateral lower lobes and right upper lobe with trace bilateral pleural effusions. Findings may be related to pulmonary edema or infection. 4. Multiple pulmonary nodules. Most severe: 7 mm left solid pulmonary nodule within the upper lobe. Non-contrast chest CT at 3-6 months is recommended. If the nodules are  stable at time of repeat CT, then future CT at 18-24 months (from today's scan) is considered optional for low-risk patients, but is recommended for high-risk patients. This recommendation follows the consensus statement: Guidelines for Management of Incidental Pulmonary Nodules Detected on CT Images: From the Fleischner Society 2017; Radiology 2017; 284:228-243. 5. Dilated stool-filled rectum with rectal wall thickening. Findings are compatible with fecal impaction and stercoral colitis. 6. Colonic diverticulosis. 7. Aortic atherosclerosis. Aortic Atherosclerosis (ICD10-I70.0). Electronically Signed   By: Greig Pique M.D.   On: 04/26/2024 00:06    CT Head Wo Contrast Result Date: 04/25/2024 CLINICAL DATA:  Mental status change, unknown cause EXAM: CT HEAD WITHOUT CONTRAST TECHNIQUE: Contiguous axial images were obtained from the base of the skull through the vertex without intravenous contrast. RADIATION DOSE REDUCTION: This exam was performed according to the departmental dose-optimization program which includes automated exposure control, adjustment of the mA and/or kV according to patient size and/or use of iterative reconstruction technique. COMPARISON:  Head CT 09/12/2023 FINDINGS: Brain: No intracranial hemorrhage, mass effect, or midline shift. No hydrocephalus. The basilar cisterns are patent. Stable degree of atrophy and chronic small vessel ischemia. Again seen remote lacunar infarcts in bilateral caudate and right basal ganglia. No evidence of territorial infarct or acute ischemia. No extra-axial or intracranial fluid collection. Vascular: Atherosclerosis of skullbase vasculature without hyperdense vessel or abnormal calcification. Skull: No fracture or focal lesion. Sinuses/Orbits: Chronic mucosal thickening within the anterior paranasal sinuses. No sinus fluid levels. No mastoid effusion. Other: None. IMPRESSION: 1. No acute intracranial abnormality. 2. Stable atrophy, chronic small vessel  ischemia, and remote lacunar infarcts. Electronically Signed   By: Andrea Gasman M.D.   On: 04/25/2024 18:02    DG Chest 1 View Result Date: 04/25/2024 CLINICAL DATA:  Altered mental status. EXAM: CHEST  1 VIEW COMPARISON:  10/09/2023 FINDINGS: Stable poor inspiration, borderline enlargement of the cardiac silhouette and mild elevation of the left hemidiaphragm. Clear lungs with normal vascularity. No acute bony abnormality. IMPRESSION: No acute abnormality. Electronically Signed   By: Elspeth Bathe M.D.   On: 04/25/2024 16:34               Scheduled Meds:  amLODipine   2.5 mg Oral Daily   atorvastatin   20 mg Oral QHS   Chlorhexidine  Gluconate Cloth  6 each Topical Daily   citalopram   20 mg Oral QHS   divalproex   250 mg  Oral Q12H   enoxaparin  (LOVENOX ) injection  40 mg Subcutaneous Q24H   levothyroxine   75 mcg Oral Q0600   LORazepam   0.5 mg Oral Q1400   losartan   100 mg Oral Daily   polyethylene glycol  17 g Oral BID   QUEtiapine   50 mg Oral TID   sodium chloride  flush  3 mL Intravenous Q12H        Continuous Infusions:         LOS: 0 days      Time spent: 35 Minutes    Belkys DELENA Lore, MD [facility]    Discharge Planning: TBD, waiting to hear from [facility]    Family contact: Spouse Julian [name] [phone], spoke with her by phone   IDT: updated   Goals of care: clear, comfort focused care, DNR   Should patient need ambulance transport, please use [facility] as they contract this service for our active hospice patients.   Please call with any hospice questions or concerns.   Greig Basket, BSN, RN [facility] Liaison [phone]

## 2024-04-26 NOTE — Progress Notes (Signed)
 PROGRESS NOTE    Julian [name]  FMW:[id number] DOB: 05/05/1926 DOA: 04/25/2024 PCP: Loreli Elsie [name], MD   Brief Narrative: 89 year old with past history significant for Alzheimer dementia, behavioral disturbance, hypertension, hyperlipidemia, hypothyroid who presents with increased agitation.  Patient was reported to be very agitated and combative the day prior to admission at his facility. Evaluation in the ED patient was noted to have elevated blood pressure.  CT head and chest x-ray no acute finding.  CT chest abdomen and pelvis notable for stercoral colitis and pulmonary nodules.  He was also noted to have urine retention Foley catheter was inserted yielding over a liter of urine.    Assessment & Plan:   Principal Problem:   Hypertensive urgency Active Problems:   Dementia with behavioral disturbance (HCC)   Acute urinary retention   Stercoral colitis   Hypothyroidism   Lung nodule   1-Hypertensive urgency - Patient presented with a blood pressure 235/90 -Continue with Cozaar , Norvasc  -PRN hydralazine .  -Per wife Norvasc  --was discontinue by his cardiologist because BP was too low. Plan to monitor on low dose Norvasc . SBP this morning was 200 range.    Pulmonary edema vs Infiltrate lower base lung:  - Check proBNP and procalcitonin level  Urinary retention - Patient presented with urinary retention after Foley catheter placed 1 L of yielding came out immediately. - Will need to be discharged with Foley catheter - Start Flomax    Stercoral colitis - No impaction noted on DRE per ED PA. - Started on bowel regimen: Miralax  BID, Senna  Will give him another suppository , asked nurse to  disimpact patient.    Dementia with behavioral disturbance Acute metabolic encephalopathy - Continue Depakote , Seroquel . - Suspect acute metabolic encephalopathy in the setting of urinary retention and hypertensive urgency, stercoral colitis.  -On Seroquel , Depakote .  -Psych  consulted.   Hypothyroidism - Continue with Synthroid   Lung nodules -Needs CT chest in 3 to 6 months.     Estimated body mass index is 21.35 kg/m as calculated from the following:   Height as of this encounter: 5' 10 (1.778 m).   Weight as of this encounter: 67.5 kg.   DVT prophylaxis: Lovenox  Code Status: DNR Family Communication:Care discussed with Wife Disposition Plan:  Status is: Observation The patient remains OBS appropriate and will d/c before 2 midnights.    Consultants:  None  Procedures:  none  Antimicrobials:    Subjective: He is sleepy. Was agitated earlier.   Objective: Vitals:   04/26/24 0002 04/26/24 0101 04/26/24 0230 04/26/24 0334  BP: (!) 140/97 (!) 142/80 (!) 235/90 (!) 119/94  Pulse: 67 68 66 67  Resp: 19 18 17 18   Temp:   98.2 F (36.8 C) 98 F (36.7 C)  TempSrc:   Oral   SpO2: 95% 98% 100% 95%  Weight:   67.5 kg   Height:   5' 10 (1.778 m)     Intake/Output Summary (Last 24 hours) at 04/26/2024 0744 Last data filed at 04/26/2024 0648 Gross per 24 hour  Intake 360 ml  Output 3600 ml  Net -3240 ml   Filed Weights   04/26/24 0230  Weight: 67.5 kg    Examination:  General exam: sleepy  Respiratory system: Clear to auscultation. Respiratory effort normal. Cardiovascular system: S1 & S2 heard, RRR.  Gastrointestinal system: Abdomen is nondistended, soft and nontender. Central nervous system: sleepy  Extremities: no edema   Data Reviewed: I have personally reviewed following labs and imaging studies  CBC:  Recent Labs  Lab 04/25/24 1627 04/26/24 0407  WBC 7.4 8.4  NEUTROABS 5.3  --   HGB 12.3* 12.7*  HCT 40.0 42.3  MCV 90.7 89.4  PLT 230 248   Basic Metabolic Panel: Recent Labs  Lab 04/25/24 1627 04/26/24 0407  NA 140 139  K 4.7 3.9  CL 103 100  CO2 27 28  GLUCOSE 89 103*  BUN 21 16  CREATININE 0.83 0.79  CALCIUM  9.6 9.6   GFR: Estimated Creatinine Clearance: 51.6 mL/min (by C-G formula based on SCr of  0.79 mg/dL). Liver Function Tests: Recent Labs  Lab 04/25/24 1627  AST 17  ALT 15  ALKPHOS 82  BILITOT 0.6  PROT 7.2  ALBUMIN 4.1   No results for input(s): LIPASE, AMYLASE in the last 168 hours. No results for input(s): AMMONIA in the last 168 hours. Coagulation Profile: No results for input(s): INR, PROTIME in the last 168 hours. Cardiac Enzymes: No results for input(s): CKTOTAL, CKMB, CKMBINDEX, TROPONINI in the last 168 hours. BNP (last 3 results) No results for input(s): PROBNP in the last 8760 hours. HbA1C: No results for input(s): HGBA1C in the last 72 hours. CBG: No results for input(s): GLUCAP in the last 168 hours. Lipid Profile: No results for input(s): CHOL, HDL, LDLCALC, TRIG, CHOLHDL, LDLDIRECT in the last 72 hours. Thyroid  Function Tests: No results for input(s): TSH, T4TOTAL, FREET4, T3FREE, THYROIDAB in the last 72 hours. Anemia Panel: No results for input(s): VITAMINB12, FOLATE, FERRITIN, TIBC, IRON, RETICCTPCT in the last 72 hours. Sepsis Labs: No results for input(s): PROCALCITON, LATICACIDVEN in the last 168 hours.  Recent Results (from the past 240 hours)  MRSA Next Gen by PCR, Nasal     Status: None   Collection Time: 04/26/24  4:29 AM   Specimen: Nasal Mucosa; Nasal Swab  Result Value Ref Range Status   MRSA by PCR Next Gen NOT DETECTED NOT DETECTED Final    Comment: (NOTE) The GeneXpert MRSA Assay (FDA approved for NASAL specimens only), is one component of a comprehensive MRSA colonization surveillance program. It is not intended to diagnose MRSA infection nor to guide or monitor treatment for MRSA infections. Test performance is not FDA approved in patients less than 60 years old. Performed at [facility], [address], [place], [place] [address]          Radiology Studies: CT Angio Chest/Abd/Pel for Dissection W and/or Wo Contrast Result Date:  04/26/2024 CLINICAL DATA:  Evaluate for dissection. EXAM: CT ANGIOGRAPHY CHEST, ABDOMEN AND PELVIS TECHNIQUE: Non-contrast CT of the chest was initially obtained. Multidetector CT imaging through the chest, abdomen and pelvis was performed using the standard protocol during bolus administration of intravenous contrast. Multiplanar reconstructed images and MIPs were obtained and reviewed to evaluate the vascular anatomy. RADIATION DOSE REDUCTION: This exam was performed according to the departmental dose-optimization program which includes automated exposure control, adjustment of the mA and/or kV according to patient size and/or use of iterative reconstruction technique. CONTRAST:  OMNIPAQUE  IOHEXOL  350 MG/ML SOLN COMPARISON:  None Available. FINDINGS: CTA CHEST FINDINGS Cardiovascular: Preferential opacification of the thoracic aorta. No evidence of thoracic aortic aneurysm or dissection. Normal heart size. No pericardial effusion. There severe atherosclerotic calcifications of the aorta. Origin of the great vessels appears patent. There is no central pulmonary embolism. Mediastinum/Nodes: No enlarged mediastinal, hilar, or axillary lymph nodes. Thyroid  gland, trachea, and esophagus demonstrate no significant findings. Lungs/Pleura: There are ground-glass opacities with associated interstitial opacities in the dependent portions  of the bilateral lower lobes and right upper lobe. There are trace bilateral pleural effusions. There is scarring in both lung apices. Peripheral slightly nodular density in the left upper lobe measures 7 mm image 8/24. There is a 3 mm right upper lobe nodule image 8/42. There is a 3 mm nodule in the left upper lobe image 8/75. Trachea and central airways are patent. Musculoskeletal: No acute osseous abnormality. There is bilateral gynecomastia. Review of the MIP images confirms the above findings. CTA ABDOMEN AND PELVIS FINDINGS VASCULAR Aorta: Normal caliber aorta without aneurysm,  dissection, vasculitis or significant stenosis. There severe atherosclerotic calcifications of the aorta. Celiac: Patent without evidence of aneurysm, dissection, vasculitis or significant stenosis. SMA: Patent without evidence of aneurysm, dissection, vasculitis or significant stenosis. Renals: Both renal arteries are patent without evidence of aneurysm, dissection, vasculitis, fibromuscular dysplasia or significant stenosis. There is mild stenosis of the origin of the right renal artery secondary to calcified atherosclerotic disease. IMA: Patent without evidence of aneurysm, dissection, vasculitis or significant stenosis. There is moderate focal stenosis of the origin. Inflow: Patent without evidence of aneurysm, dissection, vasculitis or significant stenosis. Calcified atherosclerotic disease present. Veins: No obvious venous abnormality within the limitations of this arterial phase study. Review of the MIP images confirms the above findings. NON-VASCULAR Hepatobiliary: No focal liver abnormality is seen. No gallstones, gallbladder wall thickening, or biliary dilatation. Pancreas: Unremarkable. No pancreatic ductal dilatation or surrounding inflammatory changes. Spleen: Normal in size without focal abnormality. Calcified granulomas are present. Adrenals/Urinary Tract: Small left peripelvic cysts are present. There is no definite hydronephrosis. No focal lesions are identified. The adrenal glands are within normal limits. The bladder is decompressed by Foley catheter. Stomach/Bowel: Rectum is dilated and stool-filled measuring up to 10 cm. There is rectal wall thickening. There is also large amount of stool within the transverse colon, ascending colon and sigmoid colon. There is no bowel obstruction, pneumatosis, focal inflammation or free air. Sigmoid colon diverticula are present. The appendix is not seen. Small bowel and stomach are within normal limits. Lymphatic: No enlarged lymph nodes are seen.  Reproductive: Prostate is unremarkable. Other: No abdominal wall hernia or abnormality. No abdominopelvic ascites. Musculoskeletal: Degenerative changes affect the spine. There is levoconvex curvature of the lumbar spine. Review of the MIP images confirms the above findings. IMPRESSION: 1. No evidence for aortic dissection or aneurysm. 2. Severe atherosclerotic calcifications of the aorta. 3. Ground-glass and interstitial opacities in the dependent portions of the bilateral lower lobes and right upper lobe with trace bilateral pleural effusions. Findings may be related to pulmonary edema or infection. 4. Multiple pulmonary nodules. Most severe: 7 mm left solid pulmonary nodule within the upper lobe. Non-contrast chest CT at 3-6 months is recommended. If the nodules are stable at time of repeat CT, then future CT at 18-24 months (from today's scan) is considered optional for low-risk patients, but is recommended for high-risk patients. This recommendation follows the consensus statement: Guidelines for Management of Incidental Pulmonary Nodules Detected on CT Images: From the Fleischner Society 2017; Radiology 2017; 284:228-243. 5. Dilated stool-filled rectum with rectal wall thickening. Findings are compatible with fecal impaction and stercoral colitis. 6. Colonic diverticulosis. 7. Aortic atherosclerosis. Aortic Atherosclerosis (ICD10-I70.0). Electronically Signed   By: Greig Pique M.D.   On: 04/26/2024 00:06   CT Head Wo Contrast Result Date: 04/25/2024 CLINICAL DATA:  Mental status change, unknown cause EXAM: CT HEAD WITHOUT CONTRAST TECHNIQUE: Contiguous axial images were obtained from the base of the skull through the  vertex without intravenous contrast. RADIATION DOSE REDUCTION: This exam was performed according to the departmental dose-optimization program which includes automated exposure control, adjustment of the mA and/or kV according to patient size and/or use of iterative reconstruction technique.  COMPARISON:  Head CT 09/12/2023 FINDINGS: Brain: No intracranial hemorrhage, mass effect, or midline shift. No hydrocephalus. The basilar cisterns are patent. Stable degree of atrophy and chronic small vessel ischemia. Again seen remote lacunar infarcts in bilateral caudate and right basal ganglia. No evidence of territorial infarct or acute ischemia. No extra-axial or intracranial fluid collection. Vascular: Atherosclerosis of skullbase vasculature without hyperdense vessel or abnormal calcification. Skull: No fracture or focal lesion. Sinuses/Orbits: Chronic mucosal thickening within the anterior paranasal sinuses. No sinus fluid levels. No mastoid effusion. Other: None. IMPRESSION: 1. No acute intracranial abnormality. 2. Stable atrophy, chronic small vessel ischemia, and remote lacunar infarcts. Electronically Signed   By: Andrea Gasman M.D.   On: 04/25/2024 18:02   DG Chest 1 View Result Date: 04/25/2024 CLINICAL DATA:  Altered mental status. EXAM: CHEST  1 VIEW COMPARISON:  10/09/2023 FINDINGS: Stable poor inspiration, borderline enlargement of the cardiac silhouette and mild elevation of the left hemidiaphragm. Clear lungs with normal vascularity. No acute bony abnormality. IMPRESSION: No acute abnormality. Electronically Signed   By: Elspeth Bathe M.D.   On: 04/25/2024 16:34        Scheduled Meds:  amLODipine   2.5 mg Oral Daily   atorvastatin   20 mg Oral QHS   Chlorhexidine  Gluconate Cloth  6 each Topical Daily   citalopram   20 mg Oral QHS   divalproex   250 mg Oral Q12H   enoxaparin  (LOVENOX ) injection  40 mg Subcutaneous Q24H   levothyroxine   75 mcg Oral Q0600   LORazepam   0.5 mg Oral Q1400   losartan   100 mg Oral Daily   polyethylene glycol  17 g Oral BID   QUEtiapine   50 mg Oral TID   sodium chloride  flush  3 mL Intravenous Q12H   Continuous Infusions:   LOS: 0 days    Time spent: 35 Minutes    Aaiden Depoy A Wise Fees, MD [facility]   If 7PM-7AM, please contact  night-coverage [email or web address]  04/26/2024, 7:44 AM

## 2024-04-27 DIAGNOSIS — I679 Cerebrovascular disease, unspecified: Secondary | ICD-10-CM | POA: Diagnosis not present

## 2024-04-27 DIAGNOSIS — E785 Hyperlipidemia, unspecified: Secondary | ICD-10-CM | POA: Diagnosis not present

## 2024-04-27 DIAGNOSIS — E039 Hypothyroidism, unspecified: Secondary | ICD-10-CM | POA: Diagnosis not present

## 2024-04-27 DIAGNOSIS — C8589 Other specified types of non-Hodgkin lymphoma, extranodal and solid organ sites: Secondary | ICD-10-CM | POA: Diagnosis not present

## 2024-04-27 DIAGNOSIS — I160 Hypertensive urgency: Secondary | ICD-10-CM | POA: Diagnosis not present

## 2024-04-27 DIAGNOSIS — F03918 Unspecified dementia, unspecified severity, with other behavioral disturbance: Secondary | ICD-10-CM | POA: Diagnosis not present

## 2024-04-27 DIAGNOSIS — G309 Alzheimer's disease, unspecified: Secondary | ICD-10-CM | POA: Diagnosis not present

## 2024-04-27 DIAGNOSIS — I10 Essential (primary) hypertension: Secondary | ICD-10-CM | POA: Diagnosis not present

## 2024-04-27 LAB — CBC
HCT: 42.1 % (ref 39.0–52.0)
Hemoglobin: 12.6 g/dL — ABNORMAL LOW (ref 13.0–17.0)
MCH: 27 pg (ref 26.0–34.0)
MCHC: 29.9 g/dL — ABNORMAL LOW (ref 30.0–36.0)
MCV: 90.3 fL (ref 80.0–100.0)
Platelets: 235 K/uL (ref 150–400)
RBC: 4.66 MIL/uL (ref 4.22–5.81)
RDW: 15 % (ref 11.5–15.5)
WBC: 10.3 K/uL (ref 4.0–10.5)
nRBC: 0 % (ref 0.0–0.2)

## 2024-04-27 LAB — BASIC METABOLIC PANEL WITH GFR
Anion gap: 12 (ref 5–15)
BUN: 26 mg/dL — ABNORMAL HIGH (ref 8–23)
CO2: 26 mmol/L (ref 22–32)
Calcium: 9.6 mg/dL (ref 8.9–10.3)
Chloride: 100 mmol/L (ref 98–111)
Creatinine, Ser: 1.19 mg/dL (ref 0.61–1.24)
GFR, Estimated: 56 mL/min — ABNORMAL LOW (ref >60)
Glucose, Bld: 88 mg/dL (ref 70–99)
Potassium: 4.5 mmol/L (ref 3.5–5.1)
Sodium: 137 mmol/L (ref 135–145)

## 2024-04-27 MED ORDER — BREXPIPRAZOLE 1 MG PO TABS
0.5000 mg | ORAL_TABLET | Freq: Every day | ORAL | Status: DC
  Administered 2024-04-27 – 2024-05-06 (×9): 0.5 mg via ORAL
  Filled 2024-04-27 (×10): qty 0.5

## 2024-04-27 MED ORDER — LORAZEPAM 2 MG/ML IJ SOLN
1.0000 mg | Freq: Once | INTRAMUSCULAR | Status: AC
  Administered 2024-04-27: 1 mg via INTRAVENOUS
  Filled 2024-04-27: qty 0.5

## 2024-04-27 NOTE — Evaluation (Signed)
 Occupational Therapy Evaluation Patient Details Name: Julian [name] MRN: [id number] DOB: [birth year] Today's Date: 04/27/2024   History of Present Illness   89 year old who presents with increased agitation. PMH: Alzheimer dementia, behavioral disturbance, hypertension, hyperlipidemia, hypothyroid      Clinical Impressions The pt is currently presenting with the below listed deficits (see OT problem list). As such, his ADL performance is compromised and he requires assistance for self-care management. 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 Given his impaired cognition, he required regular cues for redirection to tasks and for problem solving. He required mod-max assist to roll in bed and total assist for toileting management in bed. Shortly after performing supine to sit, the pt stated, I'm passing out. He was noted to be with a few seconds of decreased arousal and focus, subsquently requiring assist to lay back down; improvement in symptoms noted after he was back in supine. OT will follow him for further services in the acute care setting to maximize his ADL performance. OT recommends the pt return to the memory care facility at discharge; if the facility is unable to take I'm back, then consider SNF.        If plan is discharge home, recommend the following:   A lot of help with walking and/or transfers;A lot of help with bathing/dressing/bathroom;Supervision due to cognitive status;Assistance with feeding     Functional Status Assessment   Patient has had a recent decline in their functional status and demonstrates the ability to make significant improvements in function in a reasonable and predictable amount of time.     Equipment Recommendations    (to be determined)     Recommendations for Other Services          Precautions/Restrictions   Precautions Precautions: Fall Restrictions Weight Bearing Restrictions Per Provider Order: No     Mobility Bed Mobility Overal bed mobility: Needs Assistance Bed Mobility: Rolling Rolling: Max assist   Supine to sit: Max assist, HOB elevated     General bed mobility comments:  (Shortly after performing supine to sit, the pt stated, I'm passing out. He was noted to be with a few seconds of decreased arousal and focus, subsquently requiring assist to lay back down; improvement in symptoms noted after he was back in supine.)       Balance     Sitting balance-Leahy Scale: Poor          ADL either performed or assessed with clinical judgement   ADL Overall ADL's : Needs assistance/impaired Eating/Feeding: Moderate assistance;Cueing for sequencing;Cueing for safety Eating/Feeding Details (indicate cue type and reason): Per pt's sitter, the pt required ~50% for self-feeding tasks earlier in the day.             Upper Body Dressing : Cueing for safety;Cueing for sequencing;Maximal assistance   Lower Body Dressing: Bed level;Total assistance;Cueing for safety;Cueing for sequencing       Toileting- Clothing Manipulation and Hygiene: Cueing for sequencing;Bed level;Total assistance;Cueing for safety Toileting - Clothing Manipulation Details (indicate cue type and reason): Pt was noted to be with loose bowels in bed during the session. He subsequently required max assist for rolling in bed, increased assist for clothing management and total assist for hygiene/clean-up.              Pertinent Vitals/Pain Pain Assessment Pain Assessment: Faces Pain Score: 3  Pain Location: grimaces with movement Pain Descriptors / Indicators: Grimacing Pain Intervention(s): Limited activity within patient's tolerance, Monitored during session, Repositioned     Extremity/Trunk Assessment Upper Extremity Assessment Upper Extremity Assessment:  Difficult to assess due to impaired cognition;Left hand dominant (Based on informal observation of pt's functional abilities, his BUE and BLE AROM and strength appeared grossly [facility].)   Lower Extremity Assessment Lower Extremity Assessment: Difficult to assess due to impaired cognition      Communication Communication Communication: No apparent difficulties Factors Affecting Communication: Difficulty expressing self;Reduced clarity of speech   Cognition Arousal: Alert Behavior During Therapy: Restless Cognition: Cognition impaired, History of cognitive impairments   Orientation impairments: Time, Situation, Place Awareness: Intellectual awareness impaired, Online awareness impaired Memory impairment (select all impairments): Short-term memory, Working [employer] fast streamer, Non-declarative long-term memory, Geneticist, molecular long-term memory Attention impairment (select first level of impairment): Divided attention, Alternating attention, Sustained attention Executive functioning impairment (select all impairments): Problem solving, Sequencing, Reasoning, Organization                   Following commands: Impaired Following commands impaired: Follows one step commands inconsistently     Cueing  General Comments   Cueing Techniques: Verbal cues;Gestural cues;Tactile cues              Home Living Family/patient expects to be discharged to:: Unsure          Additional Comments: pt is from [facility]      Prior Functioning/Environment Prior Level of Function : Needs assist  Cognitive Assist : ADLs (cognitive);Mobility (cognitive)     Physical Assist : ADLs (physical)   ADLs (physical): Bathing;Dressing;Toileting;IADLs Mobility Comments:  (Per pt's spouse, the pt was ambulatory without an assistive device up until the past couple weeks.) ADLs Comments: Per the pt's spouse, the care staff assisted the pt with bathing and dressing; the pt is typically incontinent, though  he often attempts to get to the toilet without assistance at the facility. The pt is a former Arts development officer, Occupational hygienist, Camera operator.    OT Problem List: Decreased strength;Impaired balance (sitting and/or standing);Decreased cognition;Decreased safety awareness;Decreased knowledge of use of DME or AE   OT Treatment/Interventions: Self-care/ADL training;Therapeutic exercise;Therapeutic activities;Cognitive remediation/compensation;Energy conservation;DME and/or AE instruction;Patient/family education;Balance training      OT Goals(Current goals can be found in the care plan section)   Acute Rehab OT Goals OT Goal Formulation: Patient unable to participate in goal setting Time For Goal Achievement: 05/11/24 Potential to Achieve Goals: Fair ADL Goals Pt Will Perform Eating: with min assist;sitting Pt Will Perform Grooming: sitting;with min assist Pt Will Perform Upper Body Dressing: with min assist;sitting Additional ADL Goal #1: The pt will perform bed mobility with CGA, in prep for progressive ADL participation.   OT Frequency:  Min 2X/week       AM-PAC OT 6 Clicks Daily Activity     Outcome Measure Help from another person eating meals?: A Lot Help from another person taking care of personal grooming?: A Lot Help from another person toileting, which includes using toliet, bedpan, or urinal?: Total Help from another person bathing (including washing, rinsing, drying)?: A Lot Help from another person to put on and taking off regular upper body clothing?: A Lot Help from another person to put on and taking off regular lower body clothing?: Total 6 Click Score: 10   End of Session Equipment Utilized During Treatment: Other (comment) (N/A) Nurse Communication: Mobility status  Activity Tolerance: Other (comment) (limited by dizziness with  activity) Patient left: in bed;with call bell/phone within reach;with bed alarm set;with nursing/sitter in room  OT Visit Diagnosis: Feeding  difficulties (R63.3);Other symptoms and signs involving cognitive function;Muscle weakness (generalized) (M62.81);Other abnormalities of gait and mobility (R26.89)                Time: 8694-8673 OT Time Calculation (min): 21 min Charges:  OT General Charges $OT Visit: 1 Visit OT Evaluation $OT Eval Moderate Complexity: 1 Mod    Khanh Cordner J Harris, OTR/L 04/27/2024, 3:19 PM

## 2024-04-27 NOTE — Progress Notes (Signed)
 Julian [name] [address] [facility] Liaison Note   [name] is a current patient with [facility] with a terminal diagnosis of cerebrovascular disease. Patient was at his facility on 09.03 when he became agitated and combative. He was taken to [facility] ED and was treated for urinary retention and elevated blood pressure. Patient was admitted 09.03.2025 for severe asymptomatic hypertension that was not relieved after Foley was placed. Psych evaluation has been ordered. Per Dr. Norleen Sella with [facility] this is a related hospital admission. Patient is a DNR.   Went to the bedside and had a lengthy visit with the patient's wife. She has been trying to get in touch with [facility] to see if they will accept him back after discharge. Psychiatry NP Sharlot Becker saw him today and his medications are being adjusted. [name] was alert and pleasant but started trying to get out of bed. Sitter redirected him.    Upon evaluation, [name] does not currently meet criteria for inpatient hospice. Will continue to follow daily for any changes.     Patient remains inpatient appropriate for skilled monitoring, assessment and medication titration for complex symptom management.   V/S: 97.7, 147/60, 69, 20, Sats 94% on room air  I/O: 1560/4700 Abnormal Labs: BUN 26, GFR 56 Diagnostics: no new tests IV/PRN medications: Ativan  0.5mg  PO daily and Q8H PRN x1    Recommendations/Plan per Sharlot Becker NP Psychiatry note dated 09.05.2025:  ## Psychiatric Medication Recommendations:  -Continue Seroquel  50 mg TID -Continue Depakote  250 mg BID -Continue Celexa  20 mg daily -Continue PRN Ativan  0.5 mg every 8 hours PRN   ## Medical Decision Making Capacity: Not specifically addressed in this encounter   ## Further Work-up:  -- most recent EKG on 11/24/2023 had QtC of 489 -- Pertinent labwork reviewed earlier this admission includes: CBC and diff, urine, chem panel, EKG      ## Disposition:-- There are no psychiatric contraindications to discharge at this time   ## Behavioral / Environmental: -Delirium Precautions: Delirium Interventions for Nursing and Staff: - RN to open blinds every AM. - To Bedside: Glasses, hearing aide, and pt's own shoes. Make available to patients. when possible and encourage use. - Encourage po fluids when appropriate, keep fluids within reach. - OOB to chair with meals. - Passive ROM exercises to all extremities with AM & PM care. - RN to assess orientation to person, time and place QAM and PRN. - Recommend extended visitation hours with familiar family/friends as feasible. - Staff to minimize disturbances at night. Turn off television when pt asleep or when not in use.                ## Safety and Observation Level:  - Based on my clinical evaluation, I estimate the patient to be at low risk of self harm in the current setting. - At this time, we recommend  routine. This decision is based on my review of the chart including patient's history and current presentation, interview of the patient, mental status examination, and consideration of suicide risk including evaluating suicidal ideation, plan, intent, suicidal or self-harm behaviors, risk factors, and protective factors. This judgment is based on our ability to directly address suicide risk, implement suicide prevention strategies, and develop a safety plan while the patient is in the clinical setting. Please contact our team if there is a concern that risk level has changed.   CSSR Risk Category:C-SSRS RISK CATEGORY: No Risk   Suicide Risk Assessment:  Patient has following modifiable risk factors for suicide: current symptoms: anxiety/panic, insomnia, impulsivity, anhedonia, hopelessness, which we are addressing by medication evaluation. Patient has following non-modifiable or demographic risk factors for suicide: male gender Patient has the following protective factors against suicide:  Supportive family, no history of suicide attempts, and no history of NSSIB   Thank you for this consult request. Recommendations have been communicated to the primary team.  We will sign off at this time.    Sharlot Becker, NP                                       History of Present Illness  Relevant Aspects of [facility] Course:  Admitted on 04/25/2024 per Dr. Charlton, he presented with medical history significant for Alzheimer dementia with behavioral disturbance, hypertension, hyperlipidemia, and hypothyroidism who presents for evaluation of increased agitation.   Patient Report:  89 yo presented with an increase in agitation at his facility, history of dementia.  He does have a UTI with a urine culture pending.  The patient was alert and fidgeting with his hand mitts.  No aggression per sitter or notes.   He was living at [facility] prior to admission with his wife concerned that he cannot return.  He is in [facility] and she hopes he can transfer to [facility] in [place].  She did have a facility list of his medications which was reconciled with his current medications.  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  His wife did try to get him evaluated at the [facility] for PTSD, no success.  She is currently not concerned about his behaviors as she can redirect him.  Her big concern is not knowing where he will go after discharge.  This provider let her know that TOC will be working with her and him for placement.  No other needs voiced.   Psych ROS:  Depression: UTA Anxiety:  visible restlessness Mania (lifetime and current): denied per wife Psychosis: (lifetime and current): denied per wife   Collateral information:  Contacted wife at beside on 9/5, information provided above for the patient.   Review of Systems   Psychiatric/Behavioral:  Positive for memory loss.   All other systems reviewed and are negative.     Psychiatric and Social History  Psychiatric History:  Information collected from patient, wife, and chart   Prev Dx/Sx: dementia with behavioral disturbance Current Psych Provider: none Home Meds (current): Seroquel , Celexa , Depakote , and ABH cream Previous Med Trials: unknown Therapy: none   Prior Psych Hospitalization: none Prior Self Harm: none Prior Violence: aggressive at times   Family Psych History: none Family Hx suicide: none   Social History:  Living Situation: [facility]  Access to weapons/lethal means: none    Substance History None   Exam Findings  Physical Exam: completed by MD, reviewed by this provider Vital Signs:  Temp:  [97.6 F (36.4 C)-98 F (36.7 C)] 97.7 F (36.5 C) (09/05 0719) Pulse Rate:  [63-85] 69 (09/05 0719) Resp:  [17-20] 20 (09/05 0719) BP: (116-173)/(60-111) 147/60 (09/05 0719) SpO2:  [94 %-99 %] 94 % (09/05 0719) Blood pressure (!) 147/60, pulse 69, temperature 97.7 F (36.5 C), temperature source Oral, resp. rate 20, height 5'  10 (1.778 m), weight 67.5 kg, SpO2 94%. Body mass index is 21.35 kg/m.   Physical Exam   Mental Status Exam: General Appearance: Casual  Orientation:  Other:  self  Memory:  Poor  Concentration:  Concentration: Poor and Attention Span: Poor  Recall:  Poor  Attention  Poor  Eye Contact:  Fair  Speech:  Slow  Language:  Poor  Volume:  Normal  Mood: anxious  Affect:  Blunt  Thought Process:  confused  Thought Content:  confused  Suicidal Thoughts:  No  Homicidal Thoughts:  No  Judgement:  Impaired  Insight:  Lacking  Psychomotor Activity:  Increased  Akathisia:  No  Fund of Knowledge:  UTA       Assets:  Housing Intimacy Leisure Time Resilience Social Support  Cognition:  Impaired,  Severe  ADL's:  Impaired  AIMS (if indicated):         Other History   These have been pulled in  through the EMR, reviewed, and updated if appropriate.  Family History:  The patient's family history includes Alzheimer's disease in his brother; CAD in his father and mother; Dementia in his sister; Heart attack in his sister.   Medical History:     Past Medical History:  Diagnosis Date   Alzheimer's dementia (HCC)     Anemia     Dementia (HCC)     Hyperlipidemia     Hypertension     Prostate cancer (HCC)            Surgical History:      Past Surgical History:  Procedure Laterality Date   CARPAL TUNNEL RELEASE Left     HERNIA REPAIR        MID 80'S   KNEE SURGERY Left     PROSTATECTOMY   2001            Medications:   Current Medications    Current Facility-Administered Medications:    acetaminophen  (TYLENOL ) tablet 650 mg, 650 mg, Oral, Q6H PRN, 650 mg at 04/26/24 2122 **OR** acetaminophen  (TYLENOL ) suppository 650 mg, 650 mg, Rectal, Q6H PRN, Opyd, Evalene RAMAN, MD   amLODipine  (NORVASC ) tablet 2.5 mg, 2.5 mg, Oral, Daily, Opyd, Timothy S, MD, 2.5 mg at 04/26/24 9085   Chlorhexidine  Gluconate Cloth 2 % PADS 6 each, 6 each, Topical, Daily, Opyd, Evalene RAMAN, MD, 6 each at 04/26/24 9072   citalopram  (CELEXA ) tablet 20 mg, 20 mg, Oral, QHS, Opyd, Timothy S, MD, 20 mg at 04/26/24 2122   divalproex  (DEPAKOTE  SPRINKLE) capsule 250 mg, 250 mg, Oral, Q12H, Opyd, Timothy S, MD, 250 mg at 04/26/24 2133   enoxaparin  (LOVENOX ) injection 40 mg, 40 mg, Subcutaneous, Q24H, Opyd, Timothy S, MD, 40 mg at 04/26/24 0915   feeding supplement (ENSURE PLUS HIGH PROTEIN) liquid 237 mL, 237 mL, Oral, BID BM, Regalado, Belkys A, MD   hydrALAZINE  (APRESOLINE ) tablet 25 mg, 25 mg, Oral, Q6H PRN, Opyd, Timothy S, MD   labetalol  (NORMODYNE ) injection 10 mg, 10 mg, Intravenous, Q2H PRN, Opyd, Timothy S, MD   levothyroxine  (SYNTHROID ) tablet 75 mcg, 75 mcg, Oral, Q0600, Opyd, Timothy S, MD, 75 mcg at 04/27/24 0543   LORazepam  (ATIVAN ) tablet 0.5 mg, 0.5 mg, Oral, Q8H PRN, Opyd, Timothy S, MD, 0.5 mg  at 04/26/24 9346   LORazepam  (ATIVAN ) tablet 0.5 mg, 0.5 mg, Oral, Q1400, Opyd, Timothy S, MD   losartan  (COZAAR ) tablet 100 mg, 100 mg, Oral, Daily, Opyd, Timothy S, MD, 100 mg at 04/26/24 [phone]  polyethylene glycol (MIRALAX  / GLYCOLAX ) packet 17 g, 17 g, Oral, BID, Opyd, Timothy S, MD, 17 g at 04/26/24 2121   prochlorperazine  (COMPAZINE ) injection 5 mg, 5 mg, Intravenous, Q6H PRN, Opyd, Timothy S, MD   QUEtiapine  (SEROQUEL ) tablet 50 mg, 50 mg, Oral, TID, Opyd, Timothy S, MD, 50 mg at 04/26/24 2122   senna-docusate (Senokot-S) tablet 1 tablet, 1 tablet, Oral, BID, Regalado, Belkys A, MD, 1 tablet at 04/26/24 2122   sodium chloride  flush (NS) 0.9 % injection 3 mL, 3 mL, Intravenous, Q12H, Opyd, Timothy S, MD, 3 mL at 04/26/24 2138   tamsulosin  (FLOMAX ) capsule 0.4 mg, 0.4 mg, Oral, Daily, Regalado, Belkys A, MD     Allergies: Allergies  No Known Allergies     Sharlot Becker, NP   Discharge Planning: TBD, waiting to hear from [facility]    Family contact: Spouse Domonic Kimball [phone], met with her at the bedside   IDT: updated   Goals of care: clear, comfort focused care, DNR   Should patient need ambulance transport, please use [facility] as they contract this service for our active hospice patients.   Please call with any hospice questions or concerns.   Greig Basket, BSN, RN [facility] Liaison [phone]

## 2024-04-27 NOTE — TOC Progression Note (Signed)
 Transition of Care [facility]) - Progression Note    Patient Details  Name: Julian [name] MRN: [id number] Date of Birth: [birth year]  Transition of Care [facility]) CM/SW Contact  [name], LCSW Phone Number: 04/27/2024, 3:45 PM  Clinical Narrative:     CSW spoke with Ulla with [facility] she reported they will need updated FL2, scrips for any new medications and d/c summary at time of d/c for pt to return. IP care management to follow.   Expected Discharge Plan:  (TBD) Barriers to Discharge: Continued Medical Work up               Expected Discharge Plan and Services                                               Social Drivers of Health (SDOH) Interventions SDOH Screenings   Food Insecurity: Patient Unable To Answer (04/26/2024)  Housing: Unknown (04/26/2024)  Transportation Needs: Patient Unable To Answer (04/26/2024)  Utilities: Patient Unable To Answer (04/26/2024)  Social Connections: Patient Unable To Answer (04/26/2024)  Tobacco Use: Medium Risk (04/26/2024)    Readmission Risk Interventions     No data to display

## 2024-04-27 NOTE — Progress Notes (Signed)
 PROGRESS NOTE    Julian [name]  FMW:[id number] DOB: 08/27/1925 DOA: 04/25/2024 PCP: Loreli Elsie [name], MD   Brief Narrative: 89 year old with past history significant for Alzheimer dementia, behavioral disturbance, hypertension, hyperlipidemia, hypothyroid who presents with increased agitation.  Patient was reported to be very agitated and combative the day prior to admission at his facility. Evaluation in the ED patient was noted to have elevated blood pressure.  CT head and chest x-ray no acute finding.  CT chest abdomen and pelvis notable for stercoral colitis and pulmonary nodules.  He was also noted to have urine retention Foley catheter was inserted yielding over a liter of urine.  04/27/2024: Patient was initially, comfortable today.  Later in the evening, patient was reported to be agitated and difficult to manage.  IV Ativan  1 mg x 1 dose ordered.  Patient is already on quetiapine  and rexulti  (brexpiprazole )    Assessment & Plan:   Principal Problem:   Hypertensive urgency Active Problems:   Dementia with behavioral disturbance (HCC)   Acute urinary retention   Stercoral colitis   Hypothyroidism   Lung nodule   1-Hypertensive urgency - Patient presented with a blood pressure 235/90 -Continue with Cozaar , Norvasc  -PRN hydralazine .  -Per wife Norvasc  --was discontinue by his cardiologist because BP was too low. Plan to monitor on low dose Norvasc . SBP this morning was 200 range.  04/27/2024: Improved significantly.  Blood pressure 140/62 mmHg.  Pulmonary edema vs Infiltrate lower base lung:  - Check proBNP and procalcitonin level 04/27/2024: Procalcitonin of less than 0.1.  proBNP of 520.    Urinary retention - Patient presented with urinary retention after Foley catheter placed 1 L of yielding came out immediately. - Will need to be discharged with Foley catheter - Start Flomax  04/27/2024: Continue Flomax  0.4 mg p.o. once daily.  Follow-up with urology on  discharge.   Stercoral colitis - No impaction noted on DRE per ED PA. - Started on bowel regimen: Miralax  BID, Senna  Will give him another suppository , asked nurse to  disimpact patient. 03/27/2024: Continue to monitor   Dementia with behavioral disturbance Acute metabolic encephalopathy - Continue Depakote , Seroquel . - Suspect acute metabolic encephalopathy in the setting of urinary retention and hypertensive urgency, stercoral colitis.  -On Seroquel , Depakote .  -Psych consulted.  -825: See above documentation.  Hypothyroidism - Continue with Synthroid   Lung nodules -Needs CT chest in 3 to 6 months.     Estimated body mass index is 21.35 kg/m as calculated from the following:   Height as of this encounter: 5' 10 (1.778 m).   Weight as of this encounter: 67.5 kg.   DVT prophylaxis: Lovenox  Code Status: DNR Family Communication:Care discussed with Wife Disposition Plan:  Status is: Observation The patient remains OBS appropriate and will d/c before 2 midnights.    Consultants:  None  Procedures:  none  Antimicrobials:    Subjective: He he was sleepy, but now reported to be agitated.    Objective: Vitals:   04/27/24 0540 04/27/24 0719 04/27/24 1130 04/27/24 1545  BP: (!) 173/68 (!) 147/60 124/65 (!) 140/62  Pulse: 73 69 77 75  Resp: 20 20 20 20   Temp: 97.9 F (36.6 C) 97.7 F (36.5 C) 97.9 F (36.6 C) 98.2 F (36.8 C)  TempSrc: Axillary Oral Oral Oral  SpO2: 94% 94% 96% 95%  Weight:      Height:        Intake/Output Summary (Last 24 hours) at 04/27/2024 1920 Last data filed at  04/27/2024 1700 Gross per 24 hour  Intake 840 ml  Output 500 ml  Net 340 ml   Filed Weights   04/26/24 0230  Weight: 67.5 kg    Examination:  General exam: sleepy  Respiratory system: Clear to auscultation. Respiratory effort normal. Cardiovascular system: S1 & S2 heard, RRR.  Gastrointestinal system: Abdomen is nondistended, soft and nontender. Central nervous  system: sleepy  Extremities: no edema   Data Reviewed: I have personally reviewed following labs and imaging studies  CBC: Recent Labs  Lab 04/25/24 1627 04/26/24 0407 04/27/24 0338  WBC 7.4 8.4 10.3  NEUTROABS 5.3  --   --   HGB 12.3* 12.7* 12.6*  HCT 40.0 42.3 42.1  MCV 90.7 89.4 90.3  PLT 230 248 235   Basic Metabolic Panel: Recent Labs  Lab 04/25/24 1627 04/26/24 0407 04/27/24 0338  NA 140 139 137  K 4.7 3.9 4.5  CL 103 100 100  CO2 27 28 26   GLUCOSE 89 103* 88  BUN 21 16 26*  CREATININE 0.83 0.79 1.19  CALCIUM  9.6 9.6 9.6   GFR: Estimated Creatinine Clearance: 34.7 mL/min (by C-G formula based on SCr of 1.19 mg/dL). Liver Function Tests: Recent Labs  Lab 04/25/24 1627  AST 17  ALT 15  ALKPHOS 82  BILITOT 0.6  PROT 7.2  ALBUMIN 4.1   No results for input(s): LIPASE, AMYLASE in the last 168 hours. No results for input(s): AMMONIA in the last 168 hours. Coagulation Profile: No results for input(s): INR, PROTIME in the last 168 hours. Cardiac Enzymes: No results for input(s): CKTOTAL, CKMB, CKMBINDEX, TROPONINI in the last 168 hours. BNP (last 3 results) Recent Labs    04/26/24 0407  PROBNP 520.0*   HbA1C: No results for input(s): HGBA1C in the last 72 hours. CBG: No results for input(s): GLUCAP in the last 168 hours. Lipid Profile: No results for input(s): CHOL, HDL, LDLCALC, TRIG, CHOLHDL, LDLDIRECT in the last 72 hours. Thyroid  Function Tests: No results for input(s): TSH, T4TOTAL, FREET4, T3FREE, THYROIDAB in the last 72 hours. Anemia Panel: Recent Labs    04/26/24 0407  VITAMINB12 1,500*   Sepsis Labs: Recent Labs  Lab 04/26/24 0407  PROCALCITON <0.10    Recent Results (from the past 240 hours)  MRSA Next Gen by PCR, Nasal     Status: None   Collection Time: 04/26/24  4:29 AM   Specimen: Nasal Mucosa; Nasal Swab  Result Value Ref Range Status   MRSA by PCR Next Gen NOT DETECTED NOT  DETECTED Final    Comment: (NOTE) The GeneXpert MRSA Assay (FDA approved for NASAL specimens only), is one component of a comprehensive MRSA colonization surveillance program. It is not intended to diagnose MRSA infection nor to guide or monitor treatment for MRSA infections. Test performance is not FDA approved in patients less than 75 years old. Performed at [facility], [address], [place], [place] [address]          Radiology Studies: CT Angio Chest/Abd/Pel for Dissection W and/or Wo Contrast Result Date: 04/26/2024 CLINICAL DATA:  Evaluate for dissection. EXAM: CT ANGIOGRAPHY CHEST, ABDOMEN AND PELVIS TECHNIQUE: Non-contrast CT of the chest was initially obtained. Multidetector CT imaging through the chest, abdomen and pelvis was performed using the standard protocol during bolus administration of intravenous contrast. Multiplanar reconstructed images and MIPs were obtained and reviewed to evaluate the vascular anatomy. RADIATION DOSE REDUCTION: This exam was performed according to the departmental dose-optimization program which includes automated exposure  control, adjustment of the mA and/or kV according to patient size and/or use of iterative reconstruction technique. CONTRAST:  OMNIPAQUE  IOHEXOL  350 MG/ML SOLN COMPARISON:  None Available. FINDINGS: CTA CHEST FINDINGS Cardiovascular: Preferential opacification of the thoracic aorta. No evidence of thoracic aortic aneurysm or dissection. Normal heart size. No pericardial effusion. There severe atherosclerotic calcifications of the aorta. Origin of the great vessels appears patent. There is no central pulmonary embolism. Mediastinum/Nodes: No enlarged mediastinal, hilar, or axillary lymph nodes. Thyroid  gland, trachea, and esophagus demonstrate no significant findings. Lungs/Pleura: There are ground-glass opacities with associated interstitial opacities in the dependent portions of the bilateral lower lobes and  right upper lobe. There are trace bilateral pleural effusions. There is scarring in both lung apices. Peripheral slightly nodular density in the left upper lobe measures 7 mm image 8/24. There is a 3 mm right upper lobe nodule image 8/42. There is a 3 mm nodule in the left upper lobe image 8/75. Trachea and central airways are patent. Musculoskeletal: No acute osseous abnormality. There is bilateral gynecomastia. Review of the MIP images confirms the above findings. CTA ABDOMEN AND PELVIS FINDINGS VASCULAR Aorta: Normal caliber aorta without aneurysm, dissection, vasculitis or significant stenosis. There severe atherosclerotic calcifications of the aorta. Celiac: Patent without evidence of aneurysm, dissection, vasculitis or significant stenosis. SMA: Patent without evidence of aneurysm, dissection, vasculitis or significant stenosis. Renals: Both renal arteries are patent without evidence of aneurysm, dissection, vasculitis, fibromuscular dysplasia or significant stenosis. There is mild stenosis of the origin of the right renal artery secondary to calcified atherosclerotic disease. IMA: Patent without evidence of aneurysm, dissection, vasculitis or significant stenosis. There is moderate focal stenosis of the origin. Inflow: Patent without evidence of aneurysm, dissection, vasculitis or significant stenosis. Calcified atherosclerotic disease present. Veins: No obvious venous abnormality within the limitations of this arterial phase study. Review of the MIP images confirms the above findings. NON-VASCULAR Hepatobiliary: No focal liver abnormality is seen. No gallstones, gallbladder wall thickening, or biliary dilatation. Pancreas: Unremarkable. No pancreatic ductal dilatation or surrounding inflammatory changes. Spleen: Normal in size without focal abnormality. Calcified granulomas are present. Adrenals/Urinary Tract: Small left peripelvic cysts are present. There is no definite hydronephrosis. No focal lesions are  identified. The adrenal glands are within normal limits. The bladder is decompressed by Foley catheter. Stomach/Bowel: Rectum is dilated and stool-filled measuring up to 10 cm. There is rectal wall thickening. There is also large amount of stool within the transverse colon, ascending colon and sigmoid colon. There is no bowel obstruction, pneumatosis, focal inflammation or free air. Sigmoid colon diverticula are present. The appendix is not seen. Small bowel and stomach are within normal limits. Lymphatic: No enlarged lymph nodes are seen. Reproductive: Prostate is unremarkable. Other: No abdominal wall hernia or abnormality. No abdominopelvic ascites. Musculoskeletal: Degenerative changes affect the spine. There is levoconvex curvature of the lumbar spine. Review of the MIP images confirms the above findings. IMPRESSION: 1. No evidence for aortic dissection or aneurysm. 2. Severe atherosclerotic calcifications of the aorta. 3. Ground-glass and interstitial opacities in the dependent portions of the bilateral lower lobes and right upper lobe with trace bilateral pleural effusions. Findings may be related to pulmonary edema or infection. 4. Multiple pulmonary nodules. Most severe: 7 mm left solid pulmonary nodule within the upper lobe. Non-contrast chest CT at 3-6 months is recommended. If the nodules are stable at time of repeat CT, then future CT at 18-24 months (from today's scan) is considered optional for low-risk patients, but is  recommended for high-risk patients. This recommendation follows the consensus statement: Guidelines for Management of Incidental Pulmonary Nodules Detected on CT Images: From the Fleischner Society 2017; Radiology 2017; 284:228-243. 5. Dilated stool-filled rectum with rectal wall thickening. Findings are compatible with fecal impaction and stercoral colitis. 6. Colonic diverticulosis. 7. Aortic atherosclerosis. Aortic Atherosclerosis (ICD10-I70.0). Electronically Signed   By: Greig Pique M.D.   On: 04/26/2024 00:06        Scheduled Meds:  amLODipine   2.5 mg Oral Daily   brexpiprazole   0.5 mg Oral Daily   Chlorhexidine  Gluconate Cloth  6 each Topical Daily   citalopram   20 mg Oral QHS   divalproex   250 mg Oral Q12H   enoxaparin  (LOVENOX ) injection  40 mg Subcutaneous Q24H   feeding supplement  237 mL Oral BID BM   levothyroxine   75 mcg Oral Q0600   LORazepam   0.5 mg Oral Q1400   losartan   100 mg Oral Daily   polyethylene glycol  17 g Oral BID   QUEtiapine   50 mg Oral TID   senna-docusate  1 tablet Oral BID   sodium chloride  flush  3 mL Intravenous Q12H   tamsulosin   0.4 mg Oral Daily   Continuous Infusions:   LOS: 0 days    Time spent: 42 Minutes    Leatrice LILLETTE Chapel, MD [facility]   If 7PM-7AM, please contact night-coverage [email or web address]  04/27/2024, 7:20 PM

## 2024-04-27 NOTE — TOC Initial Note (Signed)
 Transition of Care [facility]) - Initial/Assessment Note    Patient Details  Name: Julian [name] MRN: [id number] Date of Birth: [birth year]  [facility] CM/SW Contact:    [name], LCSW Phone Number: 04/27/2024, 1:32 PM  Clinical Narrative:                  Pt is a resident at [facility]. Pt was recommended for residential hospice; a referral was made by the MD to [facility] for review for residential hospice placement. Pt was reviewed and does not meet criteria for residential hospice placement.  CSW spoke with [facility] to inquire if the pt can return to the facility with hospice services in place. Almarie with [facility] stated they are willing to take pt back however they are requesting  pt's medication to be adjusted as his behaviors is a major concerns. IP arrangement to follow.   Expected Discharge Plan:  (TBD) Barriers to Discharge: Continued Medical Work up   Patient Goals and CMS Choice            Expected Discharge Plan and Services                                              Prior Living Arrangements/Services                       Activities of Daily Living   ADL Screening (condition at time of admission) Independently performs ADLs?: No Does the patient have a NEW difficulty with bathing/dressing/toileting/self-feeding that is expected to last >3 days?: Yes (Initiates electronic notice to provider for possible OT consult) Does the patient have a NEW difficulty with getting in/out of bed, walking, or climbing stairs that is expected to last >3 days?: Yes (Initiates electronic notice to provider for possible PT consult) Does the patient have a NEW difficulty with communication that is expected to last >3 days?: No Is the patient deaf or have difficulty hearing?: Yes Does the patient have difficulty seeing, even when wearing glasses/contacts?: Yes Does the patient have difficulty concentrating, remembering, or  making decisions?: Yes  Permission Sought/Granted                  Emotional Assessment              Admission diagnosis:  Urinary retention [R33.9] Aggressive behavior [R46.89] Hypertensive urgency [I16.0] Constipation, unspecified constipation type [K59.00] Dementia with other behavioral disturbance, unspecified dementia severity, unspecified dementia type (HCC) [F03.918] Patient Active Problem List   Diagnosis Date Noted   Hypertensive urgency 04/26/2024   Acute urinary retention 04/26/2024   Stercoral colitis 04/26/2024   Hypothyroidism 04/26/2024   Lung nodule 04/26/2024   Dementia with behavioral disturbance (HCC) 07/31/2023   Memory impairment 10/29/2022   RBBB 06/14/2018   Hyperlipidemia 01/26/2017   TIA (transient ischemic attack) 09/23/2016   Carotid artery disease (HCC) 10/27/2015   HTN (hypertension) 10/23/2014   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 [place]  [address]  Phone: [phone] Fax: [phone]     Social Drivers of Health (SDOH) Social History: SDOH Screenings   Food Insecurity: Patient Unable To Answer (04/26/2024)  Housing: Unknown (04/26/2024)  Transportation Needs: Patient Unable To Answer (04/26/2024)  Utilities: Patient Unable To Answer (04/26/2024)  Social Connections: Patient Unable To Answer (04/26/2024)  Tobacco Use: Medium Risk (04/26/2024)   SDOH Interventions:     Readmission Risk Interventions     No data to display

## 2024-04-27 NOTE — Consult Note (Addendum)
 [facility] Psychiatric Consult Initial  Patient Name: .Julian [name]  MRN: [id number]  DOB: 03/02/1926  Consult Order details:  Orders (From admission, onward)     Start     Ordered   04/26/24 1123  IP CONSULT TO PSYCHIATRY       Ordering Provider: Madelyne Owen LABOR, MD  Provider:  (Not yet assigned)  Question Answer Comment  Location [facility]   Reason for Consult? agitation      04/26/24 1122   04/25/24 1826  CONSULT TO CALL ACT TEAM       Ordering Provider: Charlton Evalene RAMAN, MD  Provider:  (Not yet assigned)  Question:  Reason for Consult?  Answer:  Psych consult   04/25/24 1829             Mode of Visit: In person    Psychiatry Consult Evaluation  Service Date: April 27, 2024 LOS:  LOS: 0 days  Chief Complaint He got aggressive at [facility] and not sure he can go back.  Primary Psychiatric Diagnoses  Dementia with behavioral disturbance  Assessment  Julian [name] is a 89 y.o. male admitted: Medicallyfor 04/25/2024  2:18 PM per Dr. Charlton, he presented with medical history significant for Alzheimer dementia with behavioral disturbance, hypertension, hyperlipidemia, and hypothyroidism who presents for evaluation of increased agitation.   Personnel at his nursing facility report that he has had increased agitation and was combative yesterday.  Patient has had no complaints in the ED and does not recall being agitated or aggressive.  His current presentation of restlessness, confusion, memory loss is most consistent with dementia.  Current outpatient psychotropic medications include Seroquel , Depakote , Celexa , and ABH cream and historically he has had a positive response to these medications. He was compliant with medications prior to admission as evidenced by SNF report. On initial examination, patient was confused with his wife providing verbal information as he was not able. Please see plan below for detailed recommendations.   Diagnoses:   Active Hospital problems: Principal Problem:   Hypertensive urgency Active Problems:   Dementia with behavioral disturbance (HCC)   Acute urinary retention   Stercoral colitis   Hypothyroidism   Lung nodule    Plan   ## Psychiatric Medication Recommendations:  -Continue Seroquel  50 mg TID -Continue Depakote  250 mg BID -Continue Celexa  20 mg daily -Continue PRN Ativan  0.5 mg every 8 hours PRN  ## Medical Decision Making Capacity: Not specifically addressed in this encounter  ## Further Work-up:  -- most recent EKG on 11/24/2023 had QtC of 489 -- Pertinent labwork reviewed earlier this admission includes: CBC and diff, urine, chem panel, EKG   ## Disposition:-- There are no psychiatric contraindications to discharge at this time  ## Behavioral / Environmental: -Delirium Precautions: Delirium Interventions for Nursing and Staff: - RN to open blinds every AM. - To Bedside: Glasses, hearing aide, and pt's own shoes. Make available to patients. when possible and encourage use. - Encourage po fluids when appropriate, keep fluids within reach. - OOB to chair with meals. - Passive ROM exercises to all extremities with AM & PM care. - RN to assess orientation to person, time and place QAM and PRN. - Recommend extended visitation hours with familiar family/friends as feasible. - Staff to minimize disturbances at night. Turn off television when pt asleep or when not in use.    ## Safety and Observation Level:  - Based on my clinical evaluation, I estimate the patient to be at low risk  of self harm in the current setting. - At this time, we recommend  routine. This decision is based on my review of the chart including patient's history and current presentation, interview of the patient, mental status examination, and consideration of suicide risk including evaluating suicidal ideation, plan, intent, suicidal or self-harm behaviors, risk factors, and protective factors. This judgment is based on  our ability to directly address suicide risk, implement suicide prevention strategies, and develop a safety plan while the patient is in the clinical setting. Please contact our team if there is a concern that risk level has changed.  CSSR Risk Category:C-SSRS RISK CATEGORY: No Risk  Suicide Risk Assessment: Patient has following modifiable risk factors for suicide: current symptoms: anxiety/panic, insomnia, impulsivity, anhedonia, hopelessness, which we are addressing by medication evaluation. Patient has following non-modifiable or demographic risk factors for suicide: male gender Patient has the following protective factors against suicide: Supportive family, no history of suicide attempts, and no history of NSSIB  Thank you for this consult request. Recommendations have been communicated to the primary team.  We will sign off at this time.   Julian Becker, NP       History of Present Illness  Relevant Aspects of [facility] Course:  Admitted on 04/25/2024 per Dr. Charlton, he presented with medical history significant for Alzheimer dementia with behavioral disturbance, hypertension, hyperlipidemia, and hypothyroidism who presents for evaluation of increased agitation.  Patient Report:  89 yo presented with an increase in agitation at his facility, history of dementia.  He does have a UTI with a urine culture pending.  The patient was alert and fidgeting with his hand mitts.  No aggression per sitter or notes.  He was living at [facility] prior to admission with his wife concerned that he cannot return.  He is in [facility] and she hopes he can transfer to [facility] in [place].  She did have a facility list of his medications which was reconciled with his current medications.  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  His wife did try to get him evaluated at the [facility] for PTSD, no success.  She is currently not concerned about his behaviors as she can redirect him.  Her big concern is not knowing where he will go after discharge.  This provider let her know that [facility] will be working with her and him for placement.  No other needs voiced.  Psych ROS:  Depression: UTA Anxiety:  visible restlessness Mania (lifetime and current): denied per wife Psychosis: (lifetime and current): denied per wife  Collateral information:  Contacted wife at beside on 9/5, information provided above for the patient.  Review of Systems  Psychiatric/Behavioral:  Positive for memory loss.   All other systems reviewed and are negative.    Psychiatric and Social History  Psychiatric History:  Information collected from patient, wife, and chart  Prev Dx/Sx: dementia with behavioral disturbance Current Psych Provider: none Home Meds (current): Seroquel , Celexa , Depakote , and ABH cream Previous Med Trials: unknown Therapy: none  Prior Psych Hospitalization: none Prior Self Harm: none Prior Violence: aggressive at times  Family Psych History: none Family Hx suicide: none  Social History:  Living Situation: [facility]  Access to weapons/lethal means: none   Substance History None  Exam Findings  Physical Exam: completed by MD, reviewed  by this provider Vital Signs:  Temp:  [97.6 F (36.4 C)-98 F (36.7 C)] 97.7 F (36.5 C) (09/05 0719) Pulse Rate:  [63-85] 69 (09/05 0719) Resp:  [17-20] 20 (09/05 0719) BP: (116-173)/(60-111) 147/60 (09/05 0719) SpO2:  [94 %-99 %] 94 % (09/05 0719) Blood pressure (!) 147/60, pulse 69, temperature 97.7 F (36.5 C), temperature source Oral, resp. rate 20, height 5' 10 (1.778 m), weight 67.5 kg, SpO2 94%. Body mass index is 21.35 kg/m.  Physical Exam  Mental Status Exam: General Appearance: Casual  Orientation:  Other:  self  Memory:  Poor  Concentration:   Concentration: Poor and Attention Span: Poor  Recall:  Poor  Attention  Poor  Eye Contact:  Fair  Speech:  Slow  Language:  Poor  Volume:  Normal  Mood: anxious  Affect:  Blunt  Thought Process:  confused  Thought Content:  confused  Suicidal Thoughts:  No  Homicidal Thoughts:  No  Judgement:  Impaired  Insight:  Lacking  Psychomotor Activity:  Increased  Akathisia:  No  Fund of Knowledge:  UTA      Assets:  Housing Intimacy Leisure Time Resilience Social Support  Cognition:  Impaired,  Severe  ADL's:  Impaired  AIMS (if indicated):        Other History   These have been pulled in through the EMR, reviewed, and updated if appropriate.  Family History:  The patient's family history includes Alzheimer's disease in his brother; CAD in his father and mother; Dementia in his sister; Heart attack in his sister.  Medical History: Past Medical History:  Diagnosis Date   Alzheimer's dementia (HCC)    Anemia    Dementia (HCC)    Hyperlipidemia    Hypertension    Prostate cancer (HCC)     Surgical History: Past Surgical History:  Procedure Laterality Date   CARPAL TUNNEL RELEASE Left    HERNIA REPAIR     MID 80'S   KNEE SURGERY Left    PROSTATECTOMY  2001     Medications:   Current Facility-Administered Medications:    acetaminophen  (TYLENOL ) tablet 650 mg, 650 mg, Oral, Q6H PRN, 650 mg at 04/26/24 2122 **OR** acetaminophen  (TYLENOL ) suppository 650 mg, 650 mg, Rectal, Q6H PRN, Opyd, Evalene RAMAN, MD   amLODipine  (NORVASC ) tablet 2.5 mg, 2.5 mg, Oral, Daily, Opyd, Timothy S, MD, 2.5 mg at 04/26/24 0914   Chlorhexidine  Gluconate Cloth 2 % PADS 6 each, 6 each, Topical, Daily, Opyd, Evalene RAMAN, MD, 6 each at 04/26/24 9072   citalopram  (CELEXA ) tablet 20 mg, 20 mg, Oral, QHS, Opyd, Timothy S, MD, 20 mg at 04/26/24 2122   divalproex  (DEPAKOTE  SPRINKLE) capsule 250 mg, 250 mg, Oral, Q12H, Opyd, Timothy S, MD, 250 mg at 04/26/24 2133   enoxaparin  (LOVENOX ) injection 40 mg,  40 mg, Subcutaneous, Q24H, Opyd, Timothy S, MD, 40 mg at 04/26/24 0915   feeding supplement (ENSURE PLUS HIGH PROTEIN) liquid 237 mL, 237 mL, Oral, BID BM, Regalado, Belkys A, MD   hydrALAZINE  (APRESOLINE ) tablet 25 mg, 25 mg, Oral, Q6H PRN, Opyd, Timothy S, MD   labetalol  (NORMODYNE ) injection 10 mg, 10 mg, Intravenous, Q2H PRN, Opyd, Timothy S, MD   levothyroxine  (SYNTHROID ) tablet 75 mcg, 75 mcg, Oral, Q0600, Opyd, Timothy S, MD, 75 mcg at 04/27/24 0543   LORazepam  (ATIVAN ) tablet 0.5 mg, 0.5 mg, Oral, Q8H PRN, Opyd, Timothy S, MD, 0.5 mg at 04/26/24 0653   LORazepam  (ATIVAN ) tablet 0.5 mg, 0.5 mg, Oral, Q1400, Opyd, Timothy S,  MD   losartan  (COZAAR ) tablet 100 mg, 100 mg, Oral, Daily, Opyd, Timothy S, MD, 100 mg at 04/26/24 0914   polyethylene glycol (MIRALAX  / GLYCOLAX ) packet 17 g, 17 g, Oral, BID, Opyd, Timothy S, MD, 17 g at 04/26/24 2121   prochlorperazine  (COMPAZINE ) injection 5 mg, 5 mg, Intravenous, Q6H PRN, Opyd, Timothy S, MD   QUEtiapine  (SEROQUEL ) tablet 50 mg, 50 mg, Oral, TID, Opyd, Timothy S, MD, 50 mg at 04/26/24 2122   senna-docusate (Senokot-S) tablet 1 tablet, 1 tablet, Oral, BID, Regalado, Belkys A, MD, 1 tablet at 04/26/24 2122   sodium chloride  flush (NS) 0.9 % injection 3 mL, 3 mL, Intravenous, Q12H, Opyd, Timothy S, MD, 3 mL at 04/26/24 2138   tamsulosin  (FLOMAX ) capsule 0.4 mg, 0.4 mg, Oral, Daily, Regalado, Belkys A, MD  Allergies: No Known Allergies  Julian Becker, NP

## 2024-04-27 NOTE — Evaluation (Signed)
 Physical Therapy Evaluation Patient Details Name: Julian [name] MRN: [id number] DOB: 1926/04/17 Today's Date: 04/27/2024  History of Present Illness  89 year old with past history significant for Alzheimer dementia, behavioral disturbance, hypertension, hyperlipidemia, hypothyroid who presents with increased agitation.  Clinical Impression  Pt admitted with above diagnosis.  Pt was ambulatory until recently per pt spouse, he resides at [facility] since Dec 2024.  Pt requires max/total assist with supine<>sit and rolling R /L, this is greatly d/t cognition vs actual strength/endurance deficits. Pt is cooperative at time of PT eval.  Appears plan is for return to [facility] with hospice services, will follow in acute setting on a trial basis, update plan as needed, If ALF/memory care unable to accept pt back, may need SNF.   Pt currently with functional limitations due to the deficits listed below (see PT Problem List). Pt will benefit from acute skilled PT to increase their independence and safety with mobility to allow discharge.           If plan is discharge home, recommend the following: Two people to help with walking and/or transfers;Two people to help with bathing/dressing/bathroom;Assistance with cooking/housework;Direct supervision/assist for financial management;Help with stairs or ramp for entrance;Assist for transportation;Assistance with feeding   Can travel by private vehicle        Equipment Recommendations None recommended by PT  Recommendations for Other Services       Functional Status Assessment Patient has had a recent decline in their functional status and demonstrates the ability to make significant improvements in function in a reasonable and predictable amount of time.     Precautions / Restrictions Precautions Precautions: Fall Restrictions Weight Bearing Restrictions Per Provider Order: No      Mobility  Bed Mobility Overal bed mobility: Needs  Assistance Bed Mobility: Rolling, Supine to Sit, Sit to Supine Rolling: Max assist   Supine to sit: Max assist Sit to supine: Total assist, Max assist   General bed mobility comments: pt required assist to progress LEs off bed and elevate trunk, multi-modal cues provided. max/total assist to return to supine, position self and scoot up in bed. incontinent of stool-pt max assist to rolling R/L for NT to perform  hyigiene    Transfers                        Ambulation/Gait                  Stairs            Wheelchair Mobility     Tilt Bed    Modified Rankin (Stroke Patients Only)       Balance Overall balance assessment: Needs assistance Sitting-balance support: No upper extremity supported, Feet supported Sitting balance-Leahy Scale: Zero Sitting balance - Comments: assist to prevent posterior translation, unable to correct with cues Postural control: Posterior lean     Standing balance comment: NT                             Pertinent Vitals/Pain Pain Assessment Pain Assessment: Faces Faces Pain Scale: Hurts a little bit Pain Location: grimaces with movement Pain Descriptors / Indicators: Grimacing Pain Intervention(s): Limited activity within patient's tolerance, Monitored during session, Repositioned    Home Living Family/patient expects to be discharged to:: Unsure                   Additional Comments: pt is from  [facility]    Prior Function Prior Level of Function : Needs assist  Cognitive Assist : ADLs (cognitive);Mobility (cognitive)     Physical Assist : ADLs (physical)     Mobility Comments: pt was ambulatory until recently per pt spouse       Extremity/Trunk Assessment   Upper Extremity Assessment Upper Extremity Assessment: Overall WFL for tasks assessed    Lower Extremity Assessment Lower Extremity Assessment: Overall WFL for tasks assessed (appears with functional muscle mass  and tone)    Cervical / Trunk Assessment Cervical / Trunk Assessment: Normal  Communication   Communication Communication: No apparent difficulties Factors Affecting Communication: Difficulty expressing self;Reduced clarity of speech (speech clear at times, then unintelligible)    Cognition Arousal: Alert Behavior During Therapy: Restless   PT - Cognitive impairments: History of cognitive impairments                         Following commands: Impaired Following commands impaired: Follows one step commands inconsistently     Cueing Cueing Techniques: Verbal cues, Gestural cues, Tactile cues     General Comments      Exercises     Assessment/Plan    PT Assessment Patient needs continued PT services  PT Problem List Decreased activity tolerance;Decreased safety awareness;Decreased cognition       PT Treatment Interventions Functional mobility training;Gait training;Therapeutic activities    PT Goals (Current goals can be found in the Care Plan section)  Acute Rehab PT Goals PT Goal Formulation: With patient Time For Goal Achievement: 05/11/24 Potential to Achieve Goals: Fair    Frequency Min 1X/week     Co-evaluation               AM-PAC PT 6 Clicks Mobility  Outcome Measure Help needed turning from your back to your side while in a flat bed without using bedrails?: Total Help needed moving from lying on your back to sitting on the side of a flat bed without using bedrails?: Total Help needed moving to and from a bed to a chair (including a wheelchair)?: Total Help needed standing up from a chair using your arms (e.g., wheelchair or bedside chair)?: Total Help needed to walk in hospital room?: Total Help needed climbing 3-5 steps with a railing? : Total 6 Click Score: 6    End of Session   Activity Tolerance: Patient tolerated treatment well Patient left: with call bell/phone within reach;in bed;with nursing/sitter in room;with bed alarm  set Nurse Communication: Mobility status PT Visit Diagnosis: Other abnormalities of gait and mobility (R26.89);Difficulty in walking, not elsewhere classified (R26.2)    Time: 8579-8560 PT Time Calculation (min) (ACUTE ONLY): 19 min   Charges:   PT Evaluation $PT Eval Low Complexity: 1 Low   PT General Charges $$ ACUTE PT VISIT: 1 Visit         Rickayla Wieland, PT  Acute Rehab Dept [facility]) [phone]  04/27/2024   [facility] 04/27/2024, 2:56 PM

## 2024-04-28 DIAGNOSIS — E785 Hyperlipidemia, unspecified: Secondary | ICD-10-CM | POA: Diagnosis not present

## 2024-04-28 DIAGNOSIS — I160 Hypertensive urgency: Secondary | ICD-10-CM | POA: Diagnosis not present

## 2024-04-28 DIAGNOSIS — E039 Hypothyroidism, unspecified: Secondary | ICD-10-CM | POA: Diagnosis not present

## 2024-04-28 DIAGNOSIS — C8589 Other specified types of non-Hodgkin lymphoma, extranodal and solid organ sites: Secondary | ICD-10-CM | POA: Diagnosis not present

## 2024-04-28 DIAGNOSIS — I10 Essential (primary) hypertension: Secondary | ICD-10-CM | POA: Diagnosis not present

## 2024-04-28 DIAGNOSIS — I679 Cerebrovascular disease, unspecified: Secondary | ICD-10-CM | POA: Diagnosis not present

## 2024-04-28 DIAGNOSIS — G309 Alzheimer's disease, unspecified: Secondary | ICD-10-CM | POA: Diagnosis not present

## 2024-04-28 LAB — CULTURE, OB URINE: Culture: NO GROWTH

## 2024-04-28 MED ORDER — DEXTROSE-SODIUM CHLORIDE 5-0.9 % IV SOLN: INTRAVENOUS | Status: AC

## 2024-04-28 MED ORDER — QUETIAPINE FUMARATE 100 MG PO TABS
100.0000 mg | ORAL_TABLET | Freq: Every day | ORAL | Status: AC
  Administered 2024-04-28 – 2024-04-30 (×3): 100 mg via ORAL
  Filled 2024-04-28 (×3): qty 1

## 2024-04-28 MED ORDER — DEXTROSE-SODIUM CHLORIDE 5-0.9 % IV SOLN: INTRAVENOUS | Status: DC

## 2024-04-28 NOTE — Progress Notes (Signed)
 Julian [name] [address] [facility] Liaison Note   Julian [name] is a current patient with [facility] with a terminal diagnosis of cerebrovascular disease. Patient was at his facility on 09.03 when he became agitated and combative. He was taken to [facility] ED and was treated for urinary retention and elevated blood pressure. Patient was admitted 09.03.2025 for severe asymptomatic hypertension that was not relieved after Foley was placed. Psych evaluation has been ordered. Per Dr. Norleen Sella with [facility] this is a related hospital admission. Patient is a DNR.   Patient seen at bedside. Sitter present. Wife had gone home for the day. Patient appeared to be calm, slightly fidgeting with hands. Seemed to relax more after I adjusted his linen. Spoke with nurse caring for patient. No concerns noted. Patient was started on Rexulti  on 9.5.25. Call placed to wife, message left. Awaiting call back to ensure all concerns have been addressed.     Patient remains inpatient appropriate for skilled monitoring, assessment and medication titration and adjustment for complex symptom management.   V/S: 98.7/78/18   139/93   96% Room Air   I/O: 480/400  Abnormal Labs:  None new  Diagnostics:  no new tests  IV/PRN medications:  Dextrose  5%-0.9% NS  @75cc /hr continuous, Ativan  0.5mg  PO daily, Rexulti  0.5mg  PO daily   Assessment and Plan [name], MD 9.5.25 Principal Problem:   Hypertensive urgency Active Problems:   Dementia with behavioral disturbance (HCC)   Acute urinary retention   Stercoral colitis   Hypothyroidism   Lung nodule     1-Hypertensive urgency - Patient presented with a blood pressure 235/90 -Continue with Cozaar , Norvasc  -PRN hydralazine .  -Per wife Norvasc  --was discontinue by his cardiologist because BP was too low. Plan to monitor on low dose Norvasc . SBP this morning was 200 range.  04/27/2024: Improved significantly.  Blood  pressure 140/62 mmHg.   Pulmonary edema vs Infiltrate lower base lung:  - Check proBNP and procalcitonin level 04/27/2024: Procalcitonin of less than 0.1.  proBNP of 520.     Urinary retention - Patient presented with urinary retention after Foley catheter placed 1 L of yielding came out immediately. - Will need to be discharged with Foley catheter - Start Flomax  04/27/2024: Continue Flomax  0.4 mg p.o. once daily.  Follow-up with urology on discharge.     Stercoral colitis - No impaction noted on DRE per ED PA. - Started on bowel regimen: Miralax  BID, Senna  Will give him another suppository , asked nurse to  disimpact patient. 03/27/2024: Continue to monitor     Dementia with behavioral disturbance Acute metabolic encephalopathy - Continue Depakote , Seroquel . - Suspect acute metabolic encephalopathy in the setting of urinary retention and hypertensive urgency, stercoral colitis.  -On Seroquel , Depakote .  -Psych consulted.  -825: See above documentation.   Hypothyroidism - Continue with Synthroid    Lung nodules -Needs CT chest in 3 to 6 months.      Discharge Planning: TBD, waiting to hear from [facility]    Family contact: attempted to call wife, message left. Waiting call back.   IDT: updated   Goals of care: DNR Limited   Should patient need ambulance transport, please use [facility] as they contract this service for our active hospice patients.   Please call with any hospice questions or concerns.   Nat Babe, BSN, [facility] Liaison [phone]

## 2024-04-28 NOTE — Progress Notes (Signed)
 PROGRESS NOTE    Julian [name]  FMW:[id number] DOB: 1926/06/14 DOA: 04/25/2024 PCP: Loreli Elsie [name], MD   Brief Narrative: 89 year old with past history significant for Alzheimer dementia, behavioral disturbance, hypertension, hyperlipidemia, hypothyroid who presents with increased agitation.  Patient was reported to be very agitated and combative the day prior to admission at his facility. Evaluation in the ED patient was noted to have elevated blood pressure.  CT head and chest x-ray no acute finding.  CT chest abdomen and pelvis notable for stercoral colitis and pulmonary nodules.  He was also noted to have urine retention Foley catheter was inserted yielding over a liter of urine.  04/27/2024: Patient was initially, comfortable today.  Later in the evening, patient was reported to be agitated and difficult to manage.  IV Ativan  1 mg x 1 dose ordered.  Patient is already on quetiapine  and rexulti  (brexpiprazole )   04/28/2024: Patient seen.  Patient is sleeping quietly.  Patient was noted to be significantly agitated last night.  Severe agitation is likely due to dementing illness.   Assessment & Plan:   Principal Problem:   Hypertensive urgency Active Problems:   Dementia with behavioral disturbance (HCC)   Acute urinary retention   Stercoral colitis   Hypothyroidism   Lung nodule   1-Hypertensive urgency - Patient presented with a blood pressure 235/90 -Continue with Cozaar , Norvasc  -PRN hydralazine .  -Per wife Norvasc  --was discontinue by his cardiologist because BP was too low. Plan to monitor on low dose Norvasc . SBP this morning was 200 range.  04/27/2024: Improved significantly.  Blood pressure 140/62 mmHg.  Pulmonary edema vs Infiltrate lower base lung:  - Check proBNP and procalcitonin level 04/28/2024: Procalcitonin of less than 0.1.  proBNP of 520.    Urinary retention - Patient presented with urinary retention after Foley catheter placed 1 L of yielding came out  immediately. - Will need to be discharged with Foley catheter - Start Flomax  04/28/2024: Continue Flomax  0.4 mg p.o. once daily.  Follow-up with urology on discharge.   Stercoral colitis - No impaction noted on DRE per ED PA. - Started on bowel regimen: Miralax  BID, Senna  Will give him another suppository , asked nurse to  disimpact patient. 03/27/2024: Continue to monitor   Dementia with behavioral disturbance Acute metabolic encephalopathy - Continue Depakote , Seroquel . - Suspect acute metabolic encephalopathy in the setting of urinary retention and hypertensive urgency, stercoral colitis.  -On Seroquel , Depakote .  -Psych consulted.  - 04/28/2024: See above documentation.  Patient is on one-to-one for now.  Sleeping quietly.  Hypothyroidism - Continue with Synthroid   Lung nodules -Needs CT chest in 3 to 6 months.     Estimated body mass index is 21.35 kg/m as calculated from the following:   Height as of this encounter: 5' 10 (1.778 m).   Weight as of this encounter: 67.5 kg.   DVT prophylaxis: Lovenox  Code Status: DNR Family Communication:Care discussed with Wife Disposition Plan:  Status is: Observation The patient remains OBS appropriate and will d/c before 2 midnights.    Consultants:  Psychiatry  Procedures:  none  Antimicrobials:    Subjective: -Sleeping quietly.      Objective: Vitals:   04/27/24 1545 04/27/24 1944 04/28/24 0612 04/28/24 1058  BP: (!) 140/62 (!) 94/54 124/61 (!) 139/93  Pulse: 75 86 68 78  Resp: 20 18 18 18   Temp: 98.2 F (36.8 C) 99 F (37.2 C) 98 F (36.7 C) 98.7 F (37.1 C)  TempSrc: Oral Axillary Oral Oral  SpO2: 95% 93% 94% 96%  Weight:      Height:        Intake/Output Summary (Last 24 hours) at 04/28/2024 1635 Last data filed at 04/28/2024 1359 Gross per 24 hour  Intake 240 ml  Output 300 ml  Net -60 ml   Filed Weights   04/26/24 0230  Weight: 67.5 kg    Examination:  General exam: sleepy  Respiratory  system: Clear to auscultation. Respiratory effort normal. Cardiovascular system: S1 & S2 heard, RRR.  Gastrointestinal system: Abdomen is nondistended, soft and nontender. Central nervous system: sleepy  Extremities: no edema   Data Reviewed: I have personally reviewed following labs and imaging studies  CBC: Recent Labs  Lab 04/25/24 1627 04/26/24 0407 04/27/24 0338  WBC 7.4 8.4 10.3  NEUTROABS 5.3  --   --   HGB 12.3* 12.7* 12.6*  HCT 40.0 42.3 42.1  MCV 90.7 89.4 90.3  PLT 230 248 235   Basic Metabolic Panel: Recent Labs  Lab 04/25/24 1627 04/26/24 0407 04/27/24 0338  NA 140 139 137  K 4.7 3.9 4.5  CL 103 100 100  CO2 27 28 26   GLUCOSE 89 103* 88  BUN 21 16 26*  CREATININE 0.83 0.79 1.19  CALCIUM  9.6 9.6 9.6   GFR: Estimated Creatinine Clearance: 34.7 mL/min (by C-G formula based on SCr of 1.19 mg/dL). Liver Function Tests: Recent Labs  Lab 04/25/24 1627  AST 17  ALT 15  ALKPHOS 82  BILITOT 0.6  PROT 7.2  ALBUMIN 4.1   No results for input(s): LIPASE, AMYLASE in the last 168 hours. No results for input(s): AMMONIA in the last 168 hours. Coagulation Profile: No results for input(s): INR, PROTIME in the last 168 hours. Cardiac Enzymes: No results for input(s): CKTOTAL, CKMB, CKMBINDEX, TROPONINI in the last 168 hours. BNP (last 3 results) Recent Labs    04/26/24 0407  PROBNP 520.0*   HbA1C: No results for input(s): HGBA1C in the last 72 hours. CBG: No results for input(s): GLUCAP in the last 168 hours. Lipid Profile: No results for input(s): CHOL, HDL, LDLCALC, TRIG, CHOLHDL, LDLDIRECT in the last 72 hours. Thyroid  Function Tests: No results for input(s): TSH, T4TOTAL, FREET4, T3FREE, THYROIDAB in the last 72 hours. Anemia Panel: Recent Labs    04/26/24 0407  VITAMINB12 1,500*   Sepsis Labs: Recent Labs  Lab 04/26/24 0407  PROCALCITON <0.10    Recent Results (from the past 240 hours)  MRSA  Next Gen by PCR, Nasal     Status: None   Collection Time: 04/26/24  4:29 AM   Specimen: Nasal Mucosa; Nasal Swab  Result Value Ref Range Status   MRSA by PCR Next Gen NOT DETECTED NOT DETECTED Final    Comment: (NOTE) The GeneXpert MRSA Assay (FDA approved for NASAL specimens only), is one component of a comprehensive MRSA colonization surveillance program. It is not intended to diagnose MRSA infection nor to guide or monitor treatment for MRSA infections. Test performance is not FDA approved in patients less than 45 years old. Performed at [facility], [address], [place], [place] [address]   Culture, MAINE Urine     Status: None   Collection Time: 04/27/24  3:38 PM   Specimen: Urine, Random  Result Value Ref Range Status   Specimen Description   Final    URINE, RANDOM Performed at [facility], [address], [place], [place] [address]    Special Requests   Final  NONE Performed at [facility], [address] [place], [place] [address]    Culture   Final    NO GROWTH Performed at [facility], [address] [place], [place] [address]    Report Status 04/28/2024 FINAL  Final         Radiology Studies: No results found.       Scheduled Meds:  amLODipine   2.5 mg Oral Daily   brexpiprazole   0.5 mg Oral Daily   Chlorhexidine  Gluconate Cloth  6 each Topical Daily   citalopram   20 mg Oral QHS   divalproex   250 mg Oral Q12H   enoxaparin  (LOVENOX ) injection  40 mg Subcutaneous Q24H   feeding supplement  237 mL Oral BID BM   levothyroxine   75 mcg Oral Q0600   LORazepam   0.5 mg Oral Q1400   losartan   100 mg Oral Daily   polyethylene glycol  17 g Oral BID   QUEtiapine   100 mg Oral QHS   senna-docusate  1 tablet Oral BID   sodium chloride  flush  3 mL Intravenous Q12H   tamsulosin   0.4 mg Oral Daily   Continuous Infusions:  dextrose  5 % and 0.9 % NaCl 75 mL/hr at 04/28/24 1307     LOS: 0  days    Time spent: 35 Minutes    Leatrice LILLETTE Chapel, MD [facility]   If 7PM-7AM, please contact night-coverage [email or web address]  04/28/2024, 4:35 PM

## 2024-04-28 NOTE — Consult Note (Signed)
 Dr. Akintayo and this provider placed his cross taper orders in the chart:  transitioning from Seroquel  to Rexulti  after this provider contacted his wife and [facility] where he lives.  No other psychiatric concerns noted, psych will sign off.  Sharlot Becker, PMHNP

## 2024-04-29 DIAGNOSIS — I679 Cerebrovascular disease, unspecified: Secondary | ICD-10-CM | POA: Diagnosis not present

## 2024-04-29 DIAGNOSIS — F05 Delirium due to known physiological condition: Secondary | ICD-10-CM | POA: Diagnosis present

## 2024-04-29 DIAGNOSIS — G9341 Metabolic encephalopathy: Secondary | ICD-10-CM | POA: Diagnosis present

## 2024-04-29 DIAGNOSIS — K5641 Fecal impaction: Secondary | ICD-10-CM | POA: Diagnosis present

## 2024-04-29 DIAGNOSIS — F02C11 Dementia in other diseases classified elsewhere, severe, with agitation: Secondary | ICD-10-CM | POA: Diagnosis present

## 2024-04-29 DIAGNOSIS — E785 Hyperlipidemia, unspecified: Secondary | ICD-10-CM | POA: Diagnosis not present

## 2024-04-29 DIAGNOSIS — I10 Essential (primary) hypertension: Secondary | ICD-10-CM | POA: Diagnosis not present

## 2024-04-29 DIAGNOSIS — R339 Retention of urine, unspecified: Secondary | ICD-10-CM | POA: Diagnosis present

## 2024-04-29 DIAGNOSIS — F0284 Dementia in other diseases classified elsewhere, unspecified severity, with anxiety: Secondary | ICD-10-CM | POA: Diagnosis present

## 2024-04-29 DIAGNOSIS — F32A Depression, unspecified: Secondary | ICD-10-CM | POA: Diagnosis present

## 2024-04-29 DIAGNOSIS — K5900 Constipation, unspecified: Secondary | ICD-10-CM | POA: Diagnosis not present

## 2024-04-29 DIAGNOSIS — G309 Alzheimer's disease, unspecified: Secondary | ICD-10-CM | POA: Diagnosis not present

## 2024-04-29 DIAGNOSIS — F0283 Dementia in other diseases classified elsewhere, unspecified severity, with mood disturbance: Secondary | ICD-10-CM | POA: Diagnosis present

## 2024-04-29 DIAGNOSIS — F03918 Unspecified dementia, unspecified severity, with other behavioral disturbance: Secondary | ICD-10-CM | POA: Diagnosis not present

## 2024-04-29 DIAGNOSIS — Z515 Encounter for palliative care: Secondary | ICD-10-CM | POA: Diagnosis not present

## 2024-04-29 DIAGNOSIS — E039 Hypothyroidism, unspecified: Secondary | ICD-10-CM | POA: Diagnosis not present

## 2024-04-29 DIAGNOSIS — N401 Enlarged prostate with lower urinary tract symptoms: Secondary | ICD-10-CM | POA: Diagnosis present

## 2024-04-29 DIAGNOSIS — R4689 Other symptoms and signs involving appearance and behavior: Secondary | ICD-10-CM | POA: Diagnosis not present

## 2024-04-29 DIAGNOSIS — I160 Hypertensive urgency: Secondary | ICD-10-CM | POA: Diagnosis not present

## 2024-04-29 DIAGNOSIS — F02818 Dementia in other diseases classified elsewhere, unspecified severity, with other behavioral disturbance: Secondary | ICD-10-CM | POA: Diagnosis present

## 2024-04-29 DIAGNOSIS — Z66 Do not resuscitate: Secondary | ICD-10-CM | POA: Diagnosis present

## 2024-04-29 DIAGNOSIS — K5289 Other specified noninfective gastroenteritis and colitis: Secondary | ICD-10-CM | POA: Diagnosis present

## 2024-04-29 DIAGNOSIS — C8589 Other specified types of non-Hodgkin lymphoma, extranodal and solid organ sites: Secondary | ICD-10-CM | POA: Diagnosis not present

## 2024-04-29 DIAGNOSIS — R338 Other retention of urine: Secondary | ICD-10-CM | POA: Diagnosis not present

## 2024-04-29 NOTE — Progress Notes (Signed)
 Julian [name] [address] [facility] Liaison Note   [name] is a current patient with [facility] with a terminal diagnosis of cerebrovascular disease. Patient was at his facility on 09.03 when he became agitated and combative. He was taken to [facility] ED and was treated for urinary retention and elevated blood pressure. Patient was admitted 09.03.2025 for severe asymptomatic hypertension that was not relieved after Foley was placed. Psych evaluation has been ordered. Per Dr. Norleen [name] with [facility] this is a related hospital admission. Patient is a DNR.   Patient seen at bedside. Sitter present. Patient was calm and comfortable during visit. Last night nurse reports that patient was fulling at foley and mitts were placed. Mitts still on this morning. Wife not present. Spoke with nurse caring for patient, she stated that she was able to give all patient meds this morning with no difficulty. Call placed to wife, she had questions regarding if patient had been able to clear impaction. Per nurse patient has had several stools and this appears to be resolved. Wife had concerns about the care received at [facility]. States she plans to speak with them on her own. All questions answered and wife had no other concerns.      Patient remains inpatient appropriate for skilled monitoring, assessment and medication titration and adjustment for complex symptom management.   V/S: 98.7/74/16   156/65   94% Room Air   I/O: 1111/975   Abnormal Labs:  None new   Diagnostics:  no new tests   IV/PRN medications:  Ativan  0.5mg  PO daily and x1, Rexulti  0.5mg  PO daily   Assessment and Plan Julian Leatrice FERNS, MD 9.6.25 Assessment & Plan:   Principal Problem:   Hypertensive urgency Active Problems:   Dementia with behavioral disturbance (HCC)   Acute urinary retention   Stercoral colitis   Hypothyroidism   Lung nodule     1-Hypertensive urgency - Patient  presented with a blood pressure 235/90 -Continue with Cozaar , Norvasc  -PRN hydralazine .  -Per wife Norvasc  --was discontinue by his cardiologist because BP was too low. Plan to monitor on low dose Norvasc . SBP this morning was 200 range.  04/27/2024: Improved significantly.  Blood pressure 140/62 mmHg.   Pulmonary edema vs Infiltrate lower base lung:  - Check proBNP and procalcitonin level 04/28/2024: Procalcitonin of less than 0.1.  proBNP of 520.     Urinary retention - Patient presented with urinary retention after Foley catheter placed 1 L of yielding came out immediately. - Will need to be discharged with Foley catheter - Start Flomax  04/28/2024: Continue Flomax  0.4 mg p.o. once daily.  Follow-up with urology on discharge.     Stercoral colitis - No impaction noted on DRE per ED PA. - Started on bowel regimen: Miralax  BID, Senna  Will give him another suppository , asked nurse to  disimpact patient. 03/27/2024: Continue to monitor     Dementia with behavioral disturbance Acute metabolic encephalopathy - Continue Depakote , Seroquel . - Suspect acute metabolic encephalopathy in the setting of urinary retention and hypertensive urgency, stercoral colitis.  -On Seroquel , Depakote .  -Psych consulted.  - 04/28/2024: See above documentation.  Patient is on one-to-one for now.  Sleeping quietly.   Hypothyroidism - Continue with Synthroid    Lung nodules -Needs CT chest in 3 to 6 months.     Discharge Planning: TBD, waiting to hear from [facility]    Family contact: Spoke with wife.   IDT: updated   Goals of care: DNR  Limited   Should patient need ambulance transport, please use [facility] as they contract this service for our active hospice patients.   Please call with any hospice questions or concerns.   Julian [name], BSN, [facility] Liaison [phone]

## 2024-04-29 NOTE — Plan of Care (Signed)

## 2024-04-29 NOTE — Progress Notes (Signed)
 PROGRESS NOTE    Julian [name]  FMW:[id number] DOB: Jan 26, 1926 DOA: 04/25/2024 PCP: Loreli Elsie [name], MD   Brief Narrative: 89 year old with past history significant for Alzheimer dementia, behavioral disturbance, hypertension, hyperlipidemia, hypothyroid who presents with increased agitation.  Patient was reported to be very agitated and combative the day prior to admission at his facility. Evaluation in the ED patient was noted to have elevated blood pressure.  CT head and chest x-ray no acute finding.  CT chest abdomen and pelvis notable for stercoral colitis and pulmonary nodules.  He was also noted to have urine retention Foley catheter was inserted yielding over a liter of urine.  04/27/2024: Patient was initially, comfortable today.  Later in the evening, patient was reported to be agitated and difficult to manage.  IV Ativan  1 mg x 1 dose ordered.  Patient is already on quetiapine  and rexulti  (brexpiprazole )   04/28/2024: Patient seen.  Patient is sleeping quietly.  Patient was noted to be significantly agitated last night.  Severe agitation is likely due to dementing illness.  04/29/2024: Patient seen.  Patient is sleeping quietly.  Agitation seems to be during the nighttime.  Improvement in agitation and nighttime reported.   Assessment & Plan:   Principal Problem:   Hypertensive urgency Active Problems:   Dementia with behavioral disturbance (HCC)   Acute urinary retention   Stercoral colitis   Hypothyroidism   Lung nodule   1-Hypertensive urgency - Patient presented with a blood pressure 235/90 -Continue with Cozaar , Norvasc  -PRN hydralazine .  -Per wife Norvasc  --was discontinue by his cardiologist because BP was too low. Plan to monitor on low dose Norvasc . SBP this morning was 200 range.  04/27/2024: Improved significantly.  Blood pressure 140/62 mmHg. 04/29/2024: Blood pressure is controlled.  Pulmonary edema vs Infiltrate lower base lung:  - Check proBNP and  procalcitonin level 04/28/2024: Procalcitonin of less than 0.1.  proBNP of 520.    Urinary retention - Patient presented with urinary retention after Foley catheter placed 1 L of yielding came out immediately. - Will need to be discharged with Foley catheter - Start Flomax  04/28/2024: Continue Flomax  0.4 mg p.o. once daily.  Follow-up with urology on discharge. 04/29/2024: Continue Flomax .  Stercoral colitis - No impaction noted on DRE per ED PA. - Started on bowel regimen: Miralax  BID, Senna  Will give him another suppository , asked nurse to  disimpact patient. 04/29/2024: Continue to monitor.   Dementia with behavioral disturbance Acute metabolic encephalopathy - Continue Depakote , Seroquel . - Suspect acute metabolic encephalopathy in the setting of urinary retention and hypertensive urgency, stercoral colitis.  -On Seroquel , Depakote .  -Psych consulted.  - 04/28/2024: See above documentation.  Patient is on one-to-one for now.  Sleeping quietly.  Hypothyroidism - Continue with Synthroid   Lung nodules -Needs CT chest in 3 to 6 months.     Estimated body mass index is 21.35 kg/m as calculated from the following:   Height as of this encounter: 5' 10 (1.778 m).   Weight as of this encounter: 67.5 kg.   DVT prophylaxis: Lovenox  Code Status: DNR Family Communication:Care discussed with Wife Disposition Plan:  Status is: Observation The patient remains OBS appropriate and will d/c before 2 midnights.    Consultants:  Psychiatry  Procedures:  none  Antimicrobials:    Subjective: -Sleeping quietly.      Objective: Vitals:   04/28/24 1754 04/29/24 0525 04/29/24 1343 04/29/24 1452  BP: (!) 142/79 (!) 156/65 (!) 94/54 (!) 146/74  Pulse: 77 74  76 95  Resp: 17 16 18 16   Temp: 99.8 F (37.7 C) 98.7 F (37.1 C) 97.7 F (36.5 C) 98.9 F (37.2 C)  TempSrc: Axillary Axillary Oral Oral  SpO2: 100% 94% 99% 100%  Weight:      Height:        Intake/Output Summary  (Last 24 hours) at 04/29/2024 1926 Last data filed at 04/29/2024 1700 Gross per 24 hour  Intake 1471.63 ml  Output 925 ml  Net 546.63 ml   Filed Weights   04/26/24 0230  Weight: 67.5 kg    Examination:  General exam: sleepy  Respiratory system: Clear to auscultation. Respiratory effort normal. Cardiovascular system: S1 & S2 heard, RRR.  Gastrointestinal system: Abdomen is nondistended, soft and nontender. Central nervous system: sleepy  Extremities: no edema   Data Reviewed: I have personally reviewed following labs and imaging studies  CBC: Recent Labs  Lab 04/25/24 1627 04/26/24 0407 04/27/24 0338  WBC 7.4 8.4 10.3  NEUTROABS 5.3  --   --   HGB 12.3* 12.7* 12.6*  HCT 40.0 42.3 42.1  MCV 90.7 89.4 90.3  PLT 230 248 235   Basic Metabolic Panel: Recent Labs  Lab 04/25/24 1627 04/26/24 0407 04/27/24 0338  NA 140 139 137  K 4.7 3.9 4.5  CL 103 100 100  CO2 27 28 26   GLUCOSE 89 103* 88  BUN 21 16 26*  CREATININE 0.83 0.79 1.19  CALCIUM  9.6 9.6 9.6   GFR: Estimated Creatinine Clearance: 34.7 mL/min (by C-G formula based on SCr of 1.19 mg/dL). Liver Function Tests: Recent Labs  Lab 04/25/24 1627  AST 17  ALT 15  ALKPHOS 82  BILITOT 0.6  PROT 7.2  ALBUMIN 4.1   No results for input(s): LIPASE, AMYLASE in the last 168 hours. No results for input(s): AMMONIA in the last 168 hours. Coagulation Profile: No results for input(s): INR, PROTIME in the last 168 hours. Cardiac Enzymes: No results for input(s): CKTOTAL, CKMB, CKMBINDEX, TROPONINI in the last 168 hours. BNP (last 3 results) Recent Labs    04/26/24 0407  PROBNP 520.0*   HbA1C: No results for input(s): HGBA1C in the last 72 hours. CBG: No results for input(s): GLUCAP in the last 168 hours. Lipid Profile: No results for input(s): CHOL, HDL, LDLCALC, TRIG, CHOLHDL, LDLDIRECT in the last 72 hours. Thyroid  Function Tests: No results for input(s): TSH,  T4TOTAL, FREET4, T3FREE, THYROIDAB in the last 72 hours. Anemia Panel: No results for input(s): VITAMINB12, FOLATE, FERRITIN, TIBC, IRON, RETICCTPCT in the last 72 hours.  Sepsis Labs: Recent Labs  Lab 04/26/24 0407  PROCALCITON <0.10    Recent Results (from the past 240 hours)  MRSA Next Gen by PCR, Nasal     Status: None   Collection Time: 04/26/24  4:29 AM   Specimen: Nasal Mucosa; Nasal Swab  Result Value Ref Range Status   MRSA by PCR Next Gen NOT DETECTED NOT DETECTED Final    Comment: (NOTE) The GeneXpert MRSA Assay (FDA approved for NASAL specimens only), is one component of a comprehensive MRSA colonization surveillance program. It is not intended to diagnose MRSA infection nor to guide or monitor treatment for MRSA infections. Test performance is not FDA approved in patients less than 43 years old. Performed at [facility], [address] [place], [place] [address]   Culture, [place] Urine     Status: None   Collection Time: 04/27/24  3:38 PM   Specimen: Urine, Random  Result Value Ref  Range Status   Specimen Description   Final    URINE, RANDOM Performed at [facility], [address] [place], [place] [address]    Special Requests   Final    NONE Performed at [facility], [address] [place], [place] [address]    Culture   Final    NO GROWTH Performed at [facility], [address], [place], [place] [address]    Report Status 04/28/2024 FINAL  Final         Radiology Studies: No results found.       Scheduled Meds:  amLODipine   2.5 mg Oral Daily   brexpiprazole   0.5 mg Oral Daily   Chlorhexidine  Gluconate Cloth  6 each Topical Daily   citalopram   20 mg Oral QHS   divalproex   250 mg Oral Q12H   enoxaparin  (LOVENOX ) injection  40 mg Subcutaneous Q24H   feeding supplement  237 mL Oral BID BM   levothyroxine   75 mcg Oral Q0600   LORazepam   0.5 mg Oral Q1400    losartan   100 mg Oral Daily   polyethylene glycol  17 g Oral BID   QUEtiapine   100 mg Oral QHS   senna-docusate  1 tablet Oral BID   sodium chloride  flush  3 mL Intravenous Q12H   tamsulosin   0.4 mg Oral Daily   Continuous Infusions:     LOS: 0 days    Time spent: 63 Minutes    Leatrice LILLETTE Chapel, MD [facility]   If 7PM-7AM, please contact night-coverage [email or web address]  04/29/2024, 7:26 PM

## 2024-04-30 DIAGNOSIS — I160 Hypertensive urgency: Secondary | ICD-10-CM | POA: Diagnosis not present

## 2024-04-30 LAB — CBC WITH DIFFERENTIAL/PLATELET
Abs Immature Granulocytes: 0.03 K/uL (ref 0.00–0.07)
Basophils Absolute: 0 K/uL (ref 0.0–0.1)
Basophils Relative: 1 %
Eosinophils Absolute: 0.3 K/uL (ref 0.0–0.5)
Eosinophils Relative: 4 %
HCT: 34.7 % — ABNORMAL LOW (ref 39.0–52.0)
Hemoglobin: 11 g/dL — ABNORMAL LOW (ref 13.0–17.0)
Immature Granulocytes: 1 %
Lymphocytes Relative: 19 %
Lymphs Abs: 1.1 K/uL (ref 0.7–4.0)
MCH: 28.3 pg (ref 26.0–34.0)
MCHC: 31.7 g/dL (ref 30.0–36.0)
MCV: 89.2 fL (ref 80.0–100.0)
Monocytes Absolute: 0.6 K/uL (ref 0.1–1.0)
Monocytes Relative: 11 %
Neutro Abs: 3.8 K/uL (ref 1.7–7.7)
Neutrophils Relative %: 64 %
Platelets: 203 K/uL (ref 150–400)
RBC: 3.89 MIL/uL — ABNORMAL LOW (ref 4.22–5.81)
RDW: 14.5 % (ref 11.5–15.5)
WBC: 5.9 K/uL (ref 4.0–10.5)
nRBC: 0 % (ref 0.0–0.2)

## 2024-04-30 LAB — RENAL FUNCTION PANEL
Albumin: 3.3 g/dL — ABNORMAL LOW (ref 3.5–5.0)
Anion gap: 10 (ref 5–15)
BUN: 23 mg/dL (ref 8–23)
CO2: 25 mmol/L (ref 22–32)
Calcium: 8.6 mg/dL — ABNORMAL LOW (ref 8.9–10.3)
Chloride: 105 mmol/L (ref 98–111)
Creatinine, Ser: 0.92 mg/dL (ref 0.61–1.24)
GFR, Estimated: >60 mL/min (ref >60)
Glucose, Bld: 86 mg/dL (ref 70–99)
Phosphorus: 2.4 mg/dL — ABNORMAL LOW (ref 2.5–4.6)
Potassium: 4.3 mmol/L (ref 3.5–5.1)
Sodium: 139 mmol/L (ref 135–145)

## 2024-04-30 NOTE — Progress Notes (Signed)
 Julian [name] [address] [facility] Liaison Note   [name] is a current patient with [facility] with a terminal diagnosis of cerebrovascular disease. Patient was at his facility on 09.03 when he became agitated and combative. He was taken to [facility] ED and was treated for urinary retention and elevated blood pressure. Patient was admitted 09.03.2025 for severe asymptomatic hypertension that was not relieved after Foley was placed. Psych evaluation has been ordered. Per Dr. Norleen Sella with [facility] this is a related hospital admission. Patient is a DNR.   Patient seen at bedside. Sitter and wife also at the bedside.Patient was calm but fidgeting with washcloth.Wife stated that she was told that he could not return to [facility] with the Foley Catheter. Writer called Patient care asst. Manager Serita who stated that he could return with the Foley as [name] as he is under hospice care. Relayed this to [facility] and MD.     Patient remains inpatient appropriate for skilled monitoring, assessment and medication titration and adjustment for complex symptom management.   V/S: 98.0/78/184  126/61   97% Room Air   I/O: 3869/7450   Abnormal Labs: Alb 3.3   Diagnostics:  no new tests   IV/PRN medications:  Dextrose  5%-0.9% NS  @75cc /hr continuous, Ativan  0.5mg  PO daily, Ativan  0.5mg  PO Q8H PRN x1,  Rexulti  0.5mg  PO daily   Assessment and Plan [name], MD 9.7.25:   Assessment & Plan:   Principal Problem:   Hypertensive urgency Active Problems:   Dementia with behavioral disturbance (HCC)   Acute urinary retention   Stercoral colitis   Hypothyroidism   Lung nodule     1-Hypertensive urgency - Patient presented with a blood pressure 235/90 -Continue with Cozaar , Norvasc  -PRN hydralazine .  -Per wife Norvasc  --was discontinue by his cardiologist because BP was too low. Plan to monitor on low dose Norvasc . SBP this morning was 200 range.   04/27/2024: Improved significantly.  Blood pressure 140/62 mmHg. 04/29/2024: Blood pressure is controlled.   Pulmonary edema vs Infiltrate lower base lung:  - Check proBNP and procalcitonin level 04/28/2024: Procalcitonin of less than 0.1.  proBNP of 520.     Urinary retention - Patient presented with urinary retention after Foley catheter placed 1 L of yielding came out immediately. - Will need to be discharged with Foley catheter - Start Flomax  04/28/2024: Continue Flomax  0.4 mg p.o. once daily.  Follow-up with urology on discharge. 04/29/2024: Continue Flomax .   Stercoral colitis - No impaction noted on DRE per ED PA. - Started on bowel regimen: Miralax  BID, Senna  Will give him another suppository , asked nurse to  disimpact patient. 04/29/2024: Continue to monitor.     Dementia with behavioral disturbance Acute metabolic encephalopathy - Continue Depakote , Seroquel . - Suspect acute metabolic encephalopathy in the setting of urinary retention and hypertensive urgency, stercoral colitis.  -On Seroquel , Depakote .  -Psych consulted.  - 04/28/2024: See above documentation.  Patient is on one-to-one for now.  Sleeping quietly.   Hypothyroidism - Continue with Synthroid    Lung nodules -Needs CT chest in 3 to 6 months.        Estimated body mass index is 21.35 kg/m as calculated from the following:   Height as of this encounter: 5' 10 (1.778 m).   Weight as of this encounter: 67.5 kg.     DVT prophylaxis: Lovenox  Code Status: DNR Family Communication:Care discussed with Wife Disposition Plan:  Status is: Observation The patient remains OBS appropriate and  will d/c before 2 midnights.       Consultants:  Psychiatry   Procedures:  none   Antimicrobials:      Subjective: -Sleeping quietly.       Objective:       Vitals:    04/28/24 1754 04/29/24 0525 04/29/24 1343 04/29/24 1452  BP: (!) 142/79 (!) 156/65 (!) 94/54 (!) 146/74  Pulse: 77 74 76 95  Resp: 17 16 18 16    Temp: 99.8 F (37.7 C) 98.7 F (37.1 C) 97.7 F (36.5 C) 98.9 F (37.2 C)  TempSrc: Axillary Axillary Oral Oral  SpO2: 100% 94% 99% 100%  Weight:          Height:              Intake/Output Summary (Last 24 hours) at 04/29/2024 1926 Last data filed at 04/29/2024 1700    Gross per 24 hour  Intake 1471.63 ml  Output 925 ml  Net 546.63 ml       Filed Weights    04/26/24 0230  Weight: 67.5 kg      Examination:   General exam: sleepy  Respiratory system: Clear to auscultation. Respiratory effort normal. Cardiovascular system: S1 & S2 heard, RRR.  Gastrointestinal system: Abdomen is nondistended, soft and nontender. Central nervous system: sleepy  Extremities: no edema     Data Reviewed: I have personally reviewed following labs and imaging studies   CBC: Last Labs       Recent Labs  Lab 04/25/24 1627 04/26/24 0407 04/27/24 0338  WBC 7.4 8.4 10.3  NEUTROABS 5.3  --   --   HGB 12.3* 12.7* 12.6*  HCT 40.0 42.3 42.1  MCV 90.7 89.4 90.3  PLT 230 248 235      Basic Metabolic Panel: Last Labs       Recent Labs  Lab 04/25/24 1627 04/26/24 0407 04/27/24 0338  NA 140 139 137  K 4.7 3.9 4.5  CL 103 100 100  CO2 27 28 26   GLUCOSE 89 103* 88  BUN 21 16 26*  CREATININE 0.83 0.79 1.19  CALCIUM  9.6 9.6 9.6      GFR: Estimated Creatinine Clearance: 34.7 mL/min (by C-G formula based on SCr of 1.19 mg/dL). Liver Function Tests: Last Labs     Recent Labs  Lab 04/25/24 1627  AST 17  ALT 15  ALKPHOS 82  BILITOT 0.6  PROT 7.2  ALBUMIN 4.1      Last Labs  No results for input(s): LIPASE, AMYLASE in the last 168 hours.   Last Labs  No results for input(s): AMMONIA in the last 168 hours.   Coagulation Profile: Last Labs  No results for input(s): INR, PROTIME in the last 168 hours.   Cardiac Enzymes: Last Labs  No results for input(s): CKTOTAL, CKMB, CKMBINDEX, TROPONINI in the last 168 hours.   BNP (last 3 results) Recent Labs  (within last 365 days)     Recent Labs    04/26/24 0407  PROBNP 520.0*      HbA1C: Recent Labs (last 2 labs)  No results for input(s): HGBA1C in the last 72 hours.   CBG: Last Labs  No results for input(s): GLUCAP in the last 168 hours.   Lipid Profile: Recent Labs (last 2 labs)  No results for input(s): CHOL, HDL, LDLCALC, TRIG, CHOLHDL, LDLDIRECT in the last 72 hours.   Thyroid  Function Tests: Recent Labs (last 2 labs)  No results for input(s): TSH, T4TOTAL, FREET4, T3FREE, THYROIDAB in the  last 72 hours.   Anemia Panel: Recent Labs (last 2 labs)  No results for input(s): VITAMINB12, FOLATE, FERRITIN, TIBC, IRON, RETICCTPCT in the last 72 hours.     Sepsis Labs: Last Labs     Recent Labs  Lab 04/26/24 0407  PROCALCITON <0.10               Recent Results (from the past 240 hours)  MRSA Next Gen by PCR, Nasal     Status: None    Collection Time: 04/26/24  4:29 AM    Specimen: Nasal Mucosa; Nasal Swab  Result Value Ref Range Status    MRSA by PCR Next Gen NOT DETECTED NOT DETECTED Final      Comment: (NOTE) The GeneXpert MRSA Assay (FDA approved for NASAL specimens only), is one component of a comprehensive MRSA colonization surveillance program. It is not intended to diagnose MRSA infection nor to guide or monitor treatment for MRSA infections. Test performance is not FDA approved in patients less than 83 years old. Performed at [facility], [address] [place], [place] [address]    Culture, MAINE Urine     Status: None    Collection Time: 04/27/24  3:38 PM    Specimen: Urine, Random  Result Value Ref Range Status    Specimen Description     Final      URINE, RANDOM Performed at [facility], [address] [place], [place] [address]      Special Requests     Final      NONE Performed at [facility], [address], [place], [place] [address]       Culture     Final      NO GROWTH Performed at [facility], [address], [place], [place] [address]      Report Status 04/28/2024 FINAL   Final              Radiology Studies: Imaging Results (Last 48 hours)  No results found.               Scheduled Meds:  amLODipine   2.5 mg Oral Daily   brexpiprazole   0.5 mg Oral Daily   Chlorhexidine  Gluconate Cloth  6 each Topical Daily   citalopram   20 mg Oral QHS   divalproex   250 mg Oral Q12H   enoxaparin  (LOVENOX ) injection  40 mg Subcutaneous Q24H   feeding supplement  237 mL Oral BID BM   levothyroxine   75 mcg Oral Q0600   LORazepam   0.5 mg Oral Q1400   losartan   100 mg Oral Daily   polyethylene glycol  17 g Oral BID   QUEtiapine   100 mg Oral QHS   senna-docusate  1 tablet Oral BID   sodium chloride  flush  3 mL Intravenous Q12H   tamsulosin   0.4 mg Oral Daily        Continuous Infusions:            LOS: 0 days      Time spent: 35 Minutes   Discharge Planning: TBD, medical team monitoring patient after medication changes.   Family contact: Met with wife at the bedside. Let her know that [place] at [facility] said that the pt could  Return with foley catheter as [name] as he is under hospice care.    IDT: updated   Goals of care: DNR Limited   Should patient need ambulance transport, please use [facility] as they contract  this service for our active hospice patients.   Please call with any hospice questions or concerns.   Greig Basket, BSN, RN [facility] Liaison [phone]

## 2024-04-30 NOTE — Progress Notes (Signed)
 PROGRESS NOTE    Julian [name]  FMW:[id number] DOB: 05-Dec-1925 DOA: 04/25/2024 PCP: Loreli Elsie [name], MD   Brief Narrative: 89 year old with past history significant for Alzheimer dementia, behavioral disturbance, hypertension, hyperlipidemia, hypothyroid who presents with increased agitation.  Patient was reported to be very agitated and combative the day prior to admission at his facility. Evaluation in the ED patient was noted to have elevated blood pressure.  CT head and chest x-ray no acute finding.  CT chest abdomen and pelvis notable for stercoral colitis and pulmonary nodules.  Patient was also noted to have urine retention Foley catheter was inserted yielding over a liter of urine.  Patient is on Flomax .  Patient will follow-up with urology team on discharge for voiding trial.  04/27/2024: Patient was initially, comfortable today.  Later in the evening, patient was reported to be agitated and difficult to manage.  IV Ativan  1 mg x 1 dose ordered.  Patient is already on quetiapine  and rexulti  (brexpiprazole )   04/28/2024: Patient seen.  Patient is sleeping quietly.  Patient was noted to be significantly agitated last night.  Severe agitation is likely due to dementing illness.  04/29/2024: Patient seen.  Patient is sleeping quietly.  Agitation seems to be during the nighttime.  Improvement in agitation and nighttime reported.  04/30/2024: Patient is better during the daytime.  As per patient's parent, agitation seems to be during the evening time.  Patient is slowly improving.  Wife does not think patient is back to baseline.  Discharging patient back to the facility in the next 1 to 2 days.   Assessment & Plan:   Principal Problem:   Hypertensive urgency Active Problems:   Dementia with behavioral disturbance (HCC)   Acute urinary retention   Stercoral colitis   Hypothyroidism   Lung nodule   1-Hypertensive urgency - Patient presented with a blood pressure 235/90 -Continue with  Cozaar , Norvasc  -PRN hydralazine .  -Per wife Norvasc  --was discontinue by his cardiologist because BP was too low. Plan to monitor on low dose Norvasc . SBP this morning was 200 range.  04/30/2024: Resolved.  Pulmonary edema vs Infiltrate lower base lung:  - Check proBNP and procalcitonin level 04/28/2024: Procalcitonin of less than 0.1.  proBNP of 520.   04/30/2024: Chest x-ray is nonrevealing.  Patient remained stable.  Shortness of breath.    Urinary retention - Patient presented with urinary retention after Foley catheter placed 1 L of yielding came out immediately. - Will need to be discharged with Foley catheter - Start Flomax  04/30/2024: Continue Flomax  0.4 mg p.o. once daily.  Follow-up with urology on discharge.  Stercoral colitis - No impaction noted on DRE per ED PA. - Started on bowel regimen: Miralax  BID, Senna  Will give him another suppository , asked nurse to  disimpact patient. 04/29/2024: Continue to monitor.   Dementia with behavioral disturbance Acute metabolic encephalopathy - Continue Depakote , Seroquel . - Suspect acute metabolic encephalopathy in the setting of urinary retention and hypertensive urgency, stercoral colitis.  -On Seroquel , Depakote .  -Psych consulted.  - 04/28/2024: See above documentation.  Patient is on one-to-one for now.  Sleeping quietly.  Hypothyroidism - Continue with Synthroid   Lung nodules -Needs CT chest in 3 to 6 months.     Estimated body mass index is 21.35 kg/m as calculated from the following:   Height as of this encounter: 5' 10 (1.778 m).   Weight as of this encounter: 67.5 kg.   DVT prophylaxis: Lovenox  Code Status: DNR Family Communication:Care discussed  with Wife Disposition Plan:  Status is: Observation The patient remains OBS appropriate and will d/c before 2 midnights.    Consultants:  Psychiatry  Procedures:  none  Antimicrobials:    Subjective: - In any distress. - Trying to feed self.      Objective: Vitals:   04/29/24 1452 04/29/24 2306 04/30/24 0624 04/30/24 1357  BP: (!) 146/74 136/81 (!) 147/56 126/61  Pulse: 95 75 69 78  Resp: 16 18 18 14   Temp: 98.9 F (37.2 C) 97.8 F (36.6 C) 98.4 F (36.9 C) 98 F (36.7 C)  TempSrc: Oral Oral Oral Oral  SpO2: 100% 95% 98% 97%  Weight:      Height:        Intake/Output Summary (Last 24 hours) at 04/30/2024 1539 Last data filed at 04/30/2024 1244 Gross per 24 hour  Intake 598 ml  Output 950 ml  Net -352 ml   Filed Weights   04/26/24 0230  Weight: 67.5 kg    Examination:  General exam: sleepy  Respiratory system: Clear to auscultation. Respiratory effort normal. Cardiovascular system: S1 & S2 heard, RRR.  Gastrointestinal system: Abdomen is nondistended, soft and nontender. Central nervous system: sleepy  Extremities: no edema   Data Reviewed: I have personally reviewed following labs and imaging studies  CBC: Recent Labs  Lab 04/25/24 1627 04/26/24 0407 04/27/24 0338 04/30/24 0355  WBC 7.4 8.4 10.3 5.9  NEUTROABS 5.3  --   --  3.8  HGB 12.3* 12.7* 12.6* 11.0*  HCT 40.0 42.3 42.1 34.7*  MCV 90.7 89.4 90.3 89.2  PLT 230 248 235 203   Basic Metabolic Panel: Recent Labs  Lab 04/25/24 1627 04/26/24 0407 04/27/24 0338 04/30/24 0355  NA 140 139 137 139  K 4.7 3.9 4.5 4.3  CL 103 100 100 105  CO2 27 28 26 25   GLUCOSE 89 103* 88 86  BUN 21 16 26* 23  CREATININE 0.83 0.79 1.19 0.92  CALCIUM  9.6 9.6 9.6 8.6*  PHOS  --   --   --  2.4*   GFR: Estimated Creatinine Clearance: 44.8 mL/min (by C-G formula based on SCr of 0.92 mg/dL). Liver Function Tests: Recent Labs  Lab 04/25/24 1627 04/30/24 0355  AST 17  --   ALT 15  --   ALKPHOS 82  --   BILITOT 0.6  --   PROT 7.2  --   ALBUMIN 4.1 3.3*   No results for input(s): LIPASE, AMYLASE in the last 168 hours. No results for input(s): AMMONIA in the last 168 hours. Coagulation Profile: No results for input(s): INR, PROTIME in the last  168 hours. Cardiac Enzymes: No results for input(s): CKTOTAL, CKMB, CKMBINDEX, TROPONINI in the last 168 hours. BNP (last 3 results) Recent Labs    04/26/24 0407  PROBNP 520.0*   HbA1C: No results for input(s): HGBA1C in the last 72 hours. CBG: No results for input(s): GLUCAP in the last 168 hours. Lipid Profile: No results for input(s): CHOL, HDL, LDLCALC, TRIG, CHOLHDL, LDLDIRECT in the last 72 hours. Thyroid  Function Tests: No results for input(s): TSH, T4TOTAL, FREET4, T3FREE, THYROIDAB in the last 72 hours. Anemia Panel: No results for input(s): VITAMINB12, FOLATE, FERRITIN, TIBC, IRON, RETICCTPCT in the last 72 hours.  Sepsis Labs: Recent Labs  Lab 04/26/24 0407  PROCALCITON <0.10    Recent Results (from the past 240 hours)  MRSA Next Gen by PCR, Nasal     Status: None   Collection Time: 04/26/24  4:29 AM  Specimen: Nasal Mucosa; Nasal Swab  Result Value Ref Range Status   MRSA by PCR Next Gen NOT DETECTED NOT DETECTED Final    Comment: (NOTE) The GeneXpert MRSA Assay (FDA approved for NASAL specimens only), is one component of a comprehensive MRSA colonization surveillance program. It is not intended to diagnose MRSA infection nor to guide or monitor treatment for MRSA infections. Test performance is not FDA approved in patients less than 88 years old. Performed at [facility], [address], [place], [place] [address]   Culture, MAINE Urine     Status: None   Collection Time: 04/27/24  3:38 PM   Specimen: Urine, Random  Result Value Ref Range Status   Specimen Description   Final    URINE, RANDOM Performed at [facility], [address], [place], [place] [address]    Special Requests   Final    NONE Performed at [facility], [address] [place], [place] [address]    Culture   Final    NO GROWTH Performed at [facility], [address] [place], [place] [address]    Report Status 04/28/2024 FINAL  Final         Radiology Studies: No results found.       Scheduled Meds:  amLODipine   2.5 mg Oral Daily   brexpiprazole   0.5 mg Oral Daily   Chlorhexidine  Gluconate Cloth  6 each Topical Daily   citalopram   20 mg Oral QHS   divalproex   250 mg Oral Q12H   enoxaparin  (LOVENOX ) injection  40 mg Subcutaneous Q24H   feeding supplement  237 mL Oral BID BM   levothyroxine   75 mcg Oral Q0600   LORazepam   0.5 mg Oral Q1400   losartan   100 mg Oral Daily   polyethylene glycol  17 g Oral BID   QUEtiapine   100 mg Oral QHS   senna-docusate  1 tablet Oral BID   sodium chloride  flush  3 mL Intravenous Q12H   tamsulosin   0.4 mg Oral Daily   Continuous Infusions:     LOS: 1 day    Time spent: 28 Minutes    Leatrice LILLETTE Chapel, MD [facility]   If 7PM-7AM, please contact night-coverage [email or web address]  04/30/2024, 3:39 PM

## 2024-05-01 DIAGNOSIS — I160 Hypertensive urgency: Secondary | ICD-10-CM | POA: Diagnosis not present

## 2024-05-01 DIAGNOSIS — F03918 Unspecified dementia, unspecified severity, with other behavioral disturbance: Secondary | ICD-10-CM | POA: Diagnosis not present

## 2024-05-01 DIAGNOSIS — R338 Other retention of urine: Secondary | ICD-10-CM | POA: Diagnosis not present

## 2024-05-01 NOTE — Progress Notes (Signed)
 Foley catheter removed per order. 10 cc of saline removed from balloon. Voiding trial began at 1205 p.m. Patient is resting with eyes closed. No signs of acute distress noted or voiced. 1:1 sitter and wife are present bedside.

## 2024-05-01 NOTE — Progress Notes (Signed)
 PT Cancellation Note  Patient Details Name: Tru Rana MRN: [id number] DOB: 11-19-1925   Cancelled Treatment:    Reason Eval/Treat Not Completed: Other (comment). PT arrived at 1325 and pt in bed eating lunch. Sitter present. Sitter reported pt has been resting most of the am, pt was OOB yesterday and up most of the night. Pt is able to mobilize ad lib when intrinsically motivated. Pt is to return to [facility] with hospice services. No further PT needs at this time. PT to sign off. Thank you for this referral.    [place], PT Acute Rehab   [place] VEAR Drone 05/01/2024, 4:35 PM

## 2024-05-01 NOTE — Progress Notes (Signed)
 Julian [name] [address] [facility] Liaison Note   Julian [name] is a current patient with [facility] with a terminal diagnosis of cerebrovascular disease. Patient was at his facility on 09.03 when he became agitated and combative. He was taken to [facility] ED and was treated for urinary retention and elevated blood pressure. Patient was admitted 09.03.2025 for severe asymptomatic hypertension that was not relieved after Foley was placed. Psych evaluation has been ordered. Per Dr. Norleen Sella with [facility] this is a related hospital admission. Patient is a DNR.   TOC Christina and Clinical research associate met with patient's wife at the bedside. Pt appeared to be sleeping and still has sitter at the bedside. She is still concerned about pt going back to his facility with a Foley catheter. MD ordered void trial and Foley was discontinued. They are still monitoring the effects of the Rexulti .     Patient remains inpatient appropriate for skilled monitoring, assessment and medication titration and adjustment for complex symptom management.   V/S: 98.9/71/16   164/69   96% Room Air   I/O: 240/300   Abnormal Labs: Hgb 11.0, Hct 34.7   Diagnostics:  no new tests   IV/PRN medications:  Dextrose  5%-0.9% NS  @75cc /hr continuous, Ativan  0.5mg  PO daily, Ativan  0.5mg  PO Q8H PRN x1,  Rexulti  0.5mg  PO daily   Per Dr. Leatrice Spire note from 9.8.2025:   Principal Problem:   Hypertensive urgency Active Problems:   Dementia with behavioral disturbance (HCC)   Acute urinary retention   Stercoral colitis   Hypothyroidism   Lung nodule     1-Hypertensive urgency - Patient presented with a blood pressure 235/90 -Continue with Cozaar , Norvasc  -PRN hydralazine .  -Per wife Norvasc  --was discontinue by his cardiologist because BP was too low. Plan to monitor on low dose Norvasc . SBP this morning was 200 range.  04/30/2024: Resolved.   Pulmonary edema vs Infiltrate lower base  lung:  - Check proBNP and procalcitonin level 04/28/2024: Procalcitonin of less than 0.1.  proBNP of 520.   04/30/2024: Chest x-ray is nonrevealing.  Patient remained stable.  Shortness of breath.     Urinary retention - Patient presented with urinary retention after Foley catheter placed 1 L of yielding came out immediately. - Will need to be discharged with Foley catheter - Start Flomax  04/30/2024: Continue Flomax  0.4 mg p.o. once daily.  Follow-up with urology on discharge.   Stercoral colitis - No impaction noted on DRE per ED PA. - Started on bowel regimen: Miralax  BID, Senna  Will give him another suppository , asked nurse to  disimpact patient. 04/29/2024: Continue to monitor.     Dementia with behavioral disturbance Acute metabolic encephalopathy - Continue Depakote , Seroquel . - Suspect acute metabolic encephalopathy in the setting of urinary retention and hypertensive urgency, stercoral colitis.  -On Seroquel , Depakote .  -Psych consulted.  - 04/28/2024: See above documentation.  Patient is on one-to-one for now.  Sleeping quietly.   Hypothyroidism - Continue with Synthroid    Lung nodules -Needs CT chest in 3 to 6 months.        Estimated body mass index is 21.35 kg/m as calculated from the following:   Height as of this encounter: 5' 10 (1.778 m).   Weight as of this encounter: 67.5 kg.     DVT prophylaxis: Lovenox  Code Status: DNR Family Communication:Care discussed with Wife Disposition Plan:  Status is: Observation The patient remains OBS appropriate and will d/c before 2 midnights.  Consultants:  Psychiatry   Procedures:  none   Antimicrobials:      Subjective: - In any distress. - Trying to feed self.      Objective:       Vitals:    04/29/24 1452 04/29/24 2306 04/30/24 0624 04/30/24 1357  BP: (!) 146/74 136/81 (!) 147/56 126/61  Pulse: 95 75 69 78  Resp: 16 18 18 14   Temp: 98.9 F (37.2 C) 97.8 F (36.6 C) 98.4 F (36.9 C) 98 F (36.7  C)  TempSrc: Oral Oral Oral Oral  SpO2: 100% 95% 98% 97%  Weight:          Height:              Intake/Output Summary (Last 24 hours) at 04/30/2024 1539 Last data filed at 04/30/2024 1244    Gross per 24 hour  Intake 598 ml  Output 950 ml  Net -352 ml       Filed Weights    04/26/24 0230  Weight: 67.5 kg      Examination:   General exam: sleepy  Respiratory system: Clear to auscultation. Respiratory effort normal. Cardiovascular system: S1 & S2 heard, RRR.  Gastrointestinal system: Abdomen is nondistended, soft and nontender. Central nervous system: sleepy  Extremities: no edema     Data Reviewed: I have personally reviewed following labs and imaging studies   CBC: Last Labs        Recent Labs  Lab 04/25/24 1627 04/26/24 0407 04/27/24 0338 04/30/24 0355  WBC 7.4 8.4 10.3 5.9  NEUTROABS 5.3  --   --  3.8  HGB 12.3* 12.7* 12.6* 11.0*  HCT 40.0 42.3 42.1 34.7*  MCV 90.7 89.4 90.3 89.2  PLT 230 248 235 203      Basic Metabolic Panel: Last Labs        Recent Labs  Lab 04/25/24 1627 04/26/24 0407 04/27/24 0338 04/30/24 0355  NA 140 139 137 139  K 4.7 3.9 4.5 4.3  CL 103 100 100 105  CO2 27 28 26 25   GLUCOSE 89 103* 88 86  BUN 21 16 26* 23  CREATININE 0.83 0.79 1.19 0.92  CALCIUM  9.6 9.6 9.6 8.6*  PHOS  --   --   --  2.4*      GFR: Estimated Creatinine Clearance: 44.8 mL/min (by C-G formula based on SCr of 0.92 mg/dL). Liver Function Tests: Last Labs      Recent Labs  Lab 04/25/24 1627 04/30/24 0355  AST 17  --   ALT 15  --   ALKPHOS 82  --   BILITOT 0.6  --   PROT 7.2  --   ALBUMIN 4.1 3.3*      Last Labs  No results for input(s): LIPASE, AMYLASE in the last 168 hours.   Last Labs  No results for input(s): AMMONIA in the last 168 hours.   Coagulation Profile: Last Labs  No results for input(s): INR, PROTIME in the last 168 hours.   Cardiac Enzymes: Last Labs  No results for input(s): CKTOTAL, CKMB, CKMBINDEX,  TROPONINI in the last 168 hours.   BNP (last 3 results) Recent Labs (within last 365 days)     Recent Labs    04/26/24 0407  PROBNP 520.0*      HbA1C: Recent Labs (last 2 labs)  No results for input(s): HGBA1C in the last 72 hours.   CBG: Last Labs  No results for input(s): GLUCAP in the last 168 hours.   Lipid  Profile: Recent Labs (last 2 labs)  No results for input(s): CHOL, HDL, LDLCALC, TRIG, CHOLHDL, LDLDIRECT in the last 72 hours.   Thyroid  Function Tests: Recent Labs (last 2 labs)  No results for input(s): TSH, T4TOTAL, FREET4, T3FREE, THYROIDAB in the last 72 hours.   Anemia Panel: Recent Labs (last 2 labs)  No results for input(s): VITAMINB12, FOLATE, FERRITIN, TIBC, IRON, RETICCTPCT in the last 72 hours.     Sepsis Labs: Last Labs     Recent Labs  Lab 04/26/24 0407  PROCALCITON <0.10               Recent Results (from the past 240 hours)  MRSA Next Gen by PCR, Nasal     Status: None    Collection Time: 04/26/24  4:29 AM    Specimen: Nasal Mucosa; Nasal Swab  Result Value Ref Range Status    MRSA by PCR Next Gen NOT DETECTED NOT DETECTED Final      Comment: (NOTE) The GeneXpert MRSA Assay (FDA approved for NASAL specimens only), is one component of a comprehensive MRSA colonization surveillance program. It is not intended to diagnose MRSA infection nor to guide or monitor treatment for MRSA infections. Test performance is not FDA approved in patients less than 36 years old. Performed at [facility], [address] [place], [place] [address]    Culture, MAINE Urine     Status: None    Collection Time: 04/27/24  3:38 PM    Specimen: Urine, Random  Result Value Ref Range Status    Specimen Description     Final      URINE, RANDOM Performed at [facility], [address], [place], [place] [address]      Special Requests     Final      NONE Performed at [facility], [address] [place], [place] [address]      Culture     Final      NO GROWTH Performed at [facility], [address], [place], [place] [address]      Report Status 04/28/2024 FINAL   Final              Radiology Studies: Imaging Results (Last 48 hours)  No results found.               Scheduled Meds:  amLODipine   2.5 mg Oral Daily   brexpiprazole   0.5 mg Oral Daily   Chlorhexidine  Gluconate Cloth  6 each Topical Daily   citalopram   20 mg Oral QHS   divalproex   250 mg Oral Q12H   enoxaparin  (LOVENOX ) injection  40 mg Subcutaneous Q24H   feeding supplement  237 mL Oral BID BM   levothyroxine   75 mcg Oral Q0600   LORazepam   0.5 mg Oral Q1400   losartan   100 mg Oral Daily   polyethylene glycol  17 g Oral BID   QUEtiapine   100 mg Oral QHS   senna-docusate  1 tablet Oral BID   sodium chloride  flush  3 mL Intravenous Q12H   tamsulosin   0.4 mg Oral Daily        Continuous Infusions:            LOS: 1 day   Discharge Planning: TBD, medical team monitoring patient after medication changes.   Family contact: Met with wife at the bedside along with [facility] Christina to discuss plans for discharge.    IDT: updated  Goals of care: DNR Limited   Should patient need ambulance transport, please use [facility] as they contract this service for our active hospice patients.   Please call with any hospice questions or concerns.   Greig Basket, BSN, RN [facility] Liaison [phone]

## 2024-05-01 NOTE — Progress Notes (Signed)
 Progress Note    Julian [name]   FMW:[id number]  DOB: 1925/12/17  DOA: 04/25/2024     2 PCP: Julian Elsie [name], MD  Initial CC: Agitation  Hospital Course: Julian [name] is a 89 yo male with PMH advanced Alzheimer's dementia with behavioral disturbance, HTN, HLD, hypothyroidism who presented with increased agitation. He was admitted from [facility]. He underwent further workup for contributing etiology of his agitation. CT angio chest/abdomen/pelvis was performed.  He was noted to have a dilated stool-filled rectum with rectal wall thickening concerning for fecal impaction and stercoral colitis. He also had significant urinary retention requiring Foley catheter placement in the ER with 1 L output immediately.  Catheter was continued during hospitalization.  Psychiatry was also consulted given his severe agitation.  He was transitioned from Seroquel  to Rexulti .  Remainder of psychotropic medications were continued.     Assessment & Plan:   Hypertensive urgency - Wife had reported on admission that some medications were recently discontinued notably Norvasc  due to low blood pressures outpatient - BP elevated on admission and responded well to reinitiation with amlodipine    Urinary retention - CT on admission noted stool-filled rectum with rectal wall thickening concerning for underlying stercoral colitis which may be contributing to urinary retention along with his behavioral changes and agitation - Catheter placed in the ER and has been in place for approximately 6 days - Urine culture negative; UA on admission was noted with moderate LE, negative nitrite, 21-50 WBC, rare bacteria - Continue Flomax  - Has not yet had a trial of void and reasonable to perform prior to discharge given difficulty with outpatient urology follow-up unless necessary - Discontinue Foley today, 9/9 and continue serial bladder scans; if does have recurrent retention, will need Foley replaced and would then be  appropriate for outpatient urology follow-up   Stercoral colitis - No impaction noted on DRE per ED PA. - continue laxatives    Dementia with behavioral disturbance Acute metabolic encephalopathy -Evaluated by psychiatry as well, appreciate assistance.  Transitioned from Seroquel  to Rexulti  - Could continue up titration of Rexulti  outpatient depending on behavioral response -Continue Depakote , Ativan  -Continue with sitter -Wife reports he has significant sundowning   Hypothyroidism - Continue with Synthroid    Lung nodules -Needs CT chest in 3 to 6 months  Interval History:  Resting in bed sleeping this morning but awakens easily.  Wife and sitter present bedside. Was able to get him to wake up for me but appeared rather somnolent.  Does follow commands and moves all 4 extremities. Wife mostly reporting that he has sundowning significantly in the evenings.   Old records reviewed in assessment of this patient  Antimicrobials:   DVT prophylaxis:  enoxaparin  (LOVENOX ) injection 40 mg Start: 04/26/24 1000   Code Status:   Code Status: Limited: Do not attempt resuscitation (DNR) -DNR-LIMITED -Do Not Intubate/DNI   Mobility Assessment (Last 72 Hours)     Mobility Assessment     Row Name 04/30/24 2010 04/30/24 0800 04/29/24 2000 04/29/24 0800 04/28/24 2200   Does the patient have exclusion criteria? No - Perform mobility assessment No - Perform mobility assessment No - Perform mobility assessment No - Perform mobility assessment No - Perform mobility assessment   What is the highest level of mobility based on the mobility assessment? Level 3 (Stands with assistance) - Balance while standing  and cannot march in place Level 4 (Ambulates with assistance) - Balance while stepping forward/back - Complete Level 1 (Bedfast) - Unable to  balance while sitting on edge of bed Level 1 (Bedfast) - Unable to balance while sitting on edge of bed Level 1 (Bedfast) - Unable to balance while  sitting on edge of bed   Is the above level different from baseline mobility prior to current illness? Yes - Recommend PT order Yes - Recommend PT order Yes - Recommend PT order Yes - Recommend PT order Yes - Recommend PT order      Barriers to discharge: None Disposition Plan: [facility] HH orders placed:  Status is: Patient  Objective: Blood pressure (!) 164/69, pulse 71, temperature 97.9 F (36.6 C), temperature source Oral, resp. rate 16, height 5' 10 (1.778 m), weight 67.5 kg, SpO2 96%.  Examination:  Physical Exam Constitutional:      General: He is not in acute distress.    Comments: Chronically ill-appearing elderly gentleman lying in bed in no distress.  Awakens easily but difficulty opening eyes  HENT:     Head: Normocephalic and atraumatic.     Mouth/Throat:     Mouth: Mucous membranes are moist.  Eyes:     Extraocular Movements: Extraocular movements intact.  Cardiovascular:     Rate and Rhythm: Normal rate and regular rhythm.  Pulmonary:     Effort: Pulmonary effort is normal. No respiratory distress.     Breath sounds: Normal breath sounds. No wheezing.  Abdominal:     General: Bowel sounds are normal. There is no distension.     Palpations: Abdomen is soft.     Tenderness: There is no abdominal tenderness.  Genitourinary:    Comments: Foley in place with clear yellow urine Musculoskeletal:        General: Normal range of motion.     Cervical back: Normal range of motion and neck supple.  Skin:    General: Skin is warm and dry.  Neurological:     Comments: Follows commands and moves all 4 extremities.  Slightly rigid upper extremities      Consultants:  Psychiatry  Procedures:    Data Reviewed: No results found for this or any previous visit (from the past 24 hours).  I have reviewed pertinent nursing notes, vitals, labs, and images as necessary. I have ordered labwork to follow up on as indicated.  I have reviewed the last notes from staff over  past 24 hours. I have discussed patient's care plan and test results with nursing staff, CM/SW, and other staff as appropriate.  Time spent: Greater than 50% of the 55 minute visit was spent in counseling/coordination of care for the patient as laid out in the A&P.   LOS: 2 days   Alm Apo, MD [facility] 05/01/2024, 3:33 PM

## 2024-05-01 NOTE — Progress Notes (Signed)
 Patient has been intermittently bleeding from penis since removal of foley catheter. Patient has made a few attempts to urinate by using BSC. Attempts have been unsuccessful. Patient has had 1 large bowel movement. Peri care performed. Post-void bladder scan is 48 ml. MD made aware of patient's status. Wife is bedside and aware as well.

## 2024-05-01 NOTE — Plan of Care (Signed)
  Problem: Clinical Measurements: Goal: Respiratory complications will improve Outcome: Progressing   Problem: Clinical Measurements: Goal: Cardiovascular complication will be avoided Outcome: Progressing   Problem: Nutrition: Goal: Adequate nutrition will be maintained Outcome: Progressing   

## 2024-05-01 NOTE — Hospital Course (Addendum)
 Julian [name] is a 89 yo male with PMH advanced Alzheimer's dementia with behavioral disturbance, HTN, HLD, hypothyroidism who presented with increased agitation. He was admitted from [facility]. He underwent further workup for contributing etiology of his agitation. CT angio chest/abdomen/pelvis was performed.  He was noted to have a dilated stool-filled rectum with rectal wall thickening concerning for fecal impaction and stercoral colitis. He also had significant urinary retention requiring Foley catheter placement in the ER with 1 L output immediately.  Catheter was continued during hospitalization.  Psychiatry was also consulted given his severe agitation.  He was transitioned from Seroquel  to Rexulti .  Remainder of psychotropic medications were continued.     Assessment & Plan:   Urinary retention - CT on admission noted stool-filled rectum with rectal wall thickening concerning for underlying stercoral colitis which may be contributing to urinary retention along with his behavioral changes and agitation - Catheter placed in the ER and has been in place for approximately 6 days - Urine culture negative; UA on admission was noted with moderate LE, negative nitrite, 21-50 WBC, rare bacteria - Continue Flomax  -TOV started on 05/01/2024.  Slowly retained urine overnight with large retention morning of 05/02/2024 - performed straight cath x 1 , 9/10.   - Serial bladder scans continued and failed again overnight on 05/03/2024 and Foley catheter replaced once again - Foley will remain at discharge and patient will follow-up outpatient with urology on 05/21/2024 for repeat TOV  Physical deconditioning - Patient has had significant physical decline during hospitalization especially now with prolonged hospitalization.  Wife reports that he would ambulate independently at [facility] to meals and back.  Currently he is now a max/total assist and not safe for discharging back to memory care independently.    - Physically has improved and potentially could go to memory care; unfortunately cannot return to [facility] and has been declined from other facilities; wife not wanting to place him in a nonlocked unit.  Leaving only other option is home with hospice which she is comfortable with.  Needing to get reestablished with hospice and arrange hospital bed and any other DME, then would be able to get him home but this process may take another few days  Dementia with behavioral disturbance Acute metabolic encephalopathy -Evaluated by psychiatry as well, appreciate assistance.  Transitioned from Seroquel  to Rexulti  - Could continue up titration of Rexulti  outpatient depending on behavioral response -Continue Depakote , Ativan  -Continue with sitter -Has had some sundowning but appears to have at least improved and remaining stable for now  Stercoral colitis - No impaction noted on DRE per ED PA. - BM have improved - continue laxatives   Hypertensive urgency -resolved - Wife had reported on admission that some medications were recently discontinued notably Norvasc  due to low blood pressures outpatient - BP elevated on admission and responded well to reinitiation with amlodipine    Hypothyroidism - Continue with Synthroid    Lung nodules -Needs CT chest in 3 to 6 months

## 2024-05-02 DIAGNOSIS — I160 Hypertensive urgency: Secondary | ICD-10-CM | POA: Diagnosis not present

## 2024-05-02 DIAGNOSIS — F03918 Unspecified dementia, unspecified severity, with other behavioral disturbance: Secondary | ICD-10-CM | POA: Diagnosis not present

## 2024-05-02 DIAGNOSIS — R338 Other retention of urine: Secondary | ICD-10-CM | POA: Diagnosis not present

## 2024-05-02 MED ORDER — POLYETHYLENE GLYCOL 3350 17 G PO PACK
17.0000 g | PACK | Freq: Every day | ORAL | Status: DC | PRN
  Filled 2024-05-02: qty 1

## 2024-05-02 NOTE — [place] FL2 (Signed)
 [place]  MEDICAID FL2 LEVEL OF CARE FORM     IDENTIFICATION  Patient Name: Julian [name] Birthdate: 1926-01-30 Sex: male Admission Date (Current Location): 04/25/2024  [facility] and [place] Number:  Producer, television/film/video and Address:  [facility],  [address], [place] [address]      Provider Number: [id number]  Attending Physician Name and Address:  Patsy Lenis, MD  Relative Name and Phone Number:  Domonique, Brouillard (Spouse)  [phone] (Home Phone    Current Level of Care: Hospital Recommended Level of Care: Nursing Facility Prior Approval Number:    Date Approved/Denied:   PASRR Number: [id number]  Discharge Plan:  (LTC)    Current Diagnoses: Patient Active Problem List   Diagnosis Date Noted   Hypertensive urgency 04/26/2024   Acute urinary retention 04/26/2024   Stercoral colitis 04/26/2024   Hypothyroidism 04/26/2024   Lung nodule 04/26/2024   Dementia with behavioral disturbance (HCC) 07/31/2023   Memory impairment 10/29/2022   RBBB 06/14/2018   Hyperlipidemia 01/26/2017   TIA (transient ischemic attack) 09/23/2016   Carotid artery disease (HCC) 10/27/2015   HTN (hypertension) 10/23/2014   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   Syncope 08/29/2014    Orientation RESPIRATION BLADDER Height & Weight     Self  Normal Incontinent, Indwelling catheter Weight: 148 lb 13 oz (67.5 kg) Height:  5' 10 (177.8 cm)  BEHAVIORAL SYMPTOMS/MOOD NEUROLOGICAL BOWEL NUTRITION STATUS      Incontinent Diet (regular)  AMBULATORY STATUS COMMUNICATION OF NEEDS Skin   Extensive Assist Verbally Other (Comment) (see d/c summary)                       Personal Care Assistance Level of Assistance  Bathing, Feeding, Dressing Bathing Assistance: Limited assistance Feeding assistance: Limited assistance Dressing Assistance: Limited assistance     Functional Limitations Info  Sight, Hearing, Speech Sight Info: Adequate Hearing Info:  Adequate Speech Info: Adequate    SPECIAL CARE FACTORS FREQUENCY                       Contractures Contractures Info: Not present    Additional Factors Info  Code Status, Allergies, Psychotropic Code Status Info: DNR Allergies Info: NKA Psychotropic Info: brexpiprazole  (REXULTI ) tablet 0.5 mg,citalopram  (CELEXA ) tablet 20 mg,divalproex  (DEPAKOTE  SPRINKLE) capsule 250 mg,LORazepam  (ATIVAN ) tablet 0.5 mg         Current Medications (05/02/2024):  This is the current hospital active medication list Current Facility-Administered Medications  Medication Dose Route Frequency Provider Last Rate Last Admin   acetaminophen  (TYLENOL ) tablet 650 mg  650 mg Oral Q6H PRN Opyd, Timothy S, MD   650 mg at 04/29/24 2011   Or   acetaminophen  (TYLENOL ) suppository 650 mg  650 mg Rectal Q6H PRN Opyd, Timothy S, MD       amLODipine  (NORVASC ) tablet 2.5 mg  2.5 mg Oral Daily Opyd, Timothy S, MD   2.5 mg at 05/02/24 1144   brexpiprazole  (REXULTI ) tablet 0.5 mg  0.5 mg Oral Daily Jacquetta Sharlot GRADE, NP   0.5 mg at 05/02/24 1144   citalopram  (CELEXA ) tablet 20 mg  20 mg Oral QHS Opyd, Timothy S, MD   20 mg at 05/01/24 2034   divalproex  (DEPAKOTE  SPRINKLE) capsule 250 mg  250 mg Oral Q12H Opyd, Timothy S, MD   250 mg at 05/02/24 1144   enoxaparin  (LOVENOX ) injection 40 mg  40 mg Subcutaneous Q24H Opyd, Timothy S, MD   40 mg at 05/02/24  1145   feeding supplement (ENSURE PLUS HIGH PROTEIN) liquid 237 mL  237 mL Oral BID BM Regalado, Belkys A, MD   237 mL at 05/02/24 1145   hydrALAZINE  (APRESOLINE ) tablet 25 mg  25 mg Oral Q6H PRN Opyd, Timothy S, MD       labetalol  (NORMODYNE ) injection 10 mg  10 mg Intravenous Q2H PRN Opyd, Evalene RAMAN, MD       levothyroxine  (SYNTHROID ) tablet 75 mcg  75 mcg Oral Q0600 Opyd, Timothy S, MD   75 mcg at 05/02/24 9385   LORazepam  (ATIVAN ) tablet 0.5 mg  0.5 mg Oral Q8H PRN Opyd, Timothy S, MD   0.5 mg at 05/01/24 2033   LORazepam  (ATIVAN ) tablet 0.5 mg  0.5 mg Oral Q1400  Opyd, Timothy S, MD   0.5 mg at 05/02/24 1341   losartan  (COZAAR ) tablet 100 mg  100 mg Oral Daily Opyd, Timothy S, MD   100 mg at 05/02/24 1144   polyethylene glycol (MIRALAX  / GLYCOLAX ) packet 17 g  17 g Oral Daily PRN Patsy Lenis, MD       prochlorperazine  (COMPAZINE ) injection 5 mg  5 mg Intravenous Q6H PRN Opyd, Timothy S, MD       senna-docusate (Senokot-S) tablet 1 tablet  1 tablet Oral BID Regalado, Belkys A, MD   1 tablet at 05/02/24 1144   sodium chloride  flush (NS) 0.9 % injection 3 mL  3 mL Intravenous Q12H Opyd, Timothy S, MD   3 mL at 05/02/24 1145   tamsulosin  (FLOMAX ) capsule 0.4 mg  0.4 mg Oral Daily Regalado, Belkys A, MD   0.4 mg at 05/02/24 1144     Discharge Medications: Please see discharge summary for a list of discharge medications.  Relevant Imaging Results:  Relevant Lab Results:   Additional Information SSN:[id number]  Tawni HERO Llana Deshazo, LCSW

## 2024-05-02 NOTE — Progress Notes (Signed)
 Patient intermittently catheterized requiring use of coude catheter. Resulted in 800cc of clear yellow urine. Patient tolerated well. Kayle Passarelli, Lonell Louder, RN

## 2024-05-02 NOTE — Progress Notes (Signed)
 Julian Julian [name] [address] [facility] Liaison Note   Julian Julian [name] is a current patient with [facility] with a terminal diagnosis of cerebrovascular disease. Patient was at his facility on 09.03 when he became agitated and combative. He was taken to [facility] ED and was treated for urinary retention and elevated blood pressure. Patient was admitted 09.03.2025 for severe asymptomatic hypertension that was not relieved after Foley was placed. Psych evaluation has been ordered. Per Dr. Norleen Sella with [facility] this is a related hospital admission. Patient is a DNR.   Went to the bedside to meet with pts wife and RN Julian Julian [name] was speaking with her about the possibility of Julian [name] term care. MD believes that [facility] cannot provide patient the level of care that he needs. Writer communicated with TOC Christina and the IP care manager will follow up.     Patient remains inpatient appropriate for skilled monitoring, assessment and medication titration and adjustment for complex symptom management.   V/S: 97.8/69/18   157/74   98% Room Air   I/O: 483/1000   Abnormal Labs: no new labs   Diagnostics:  no new tests   IV/PRN medications:  Dextrose  5%-0.9% NS  @75cc /hr continuous, Ativan  0.5mg  PO daily, Ativan  0.5mg  PO Q8H PRN x1,  Rexulti  0.5mg  PO daily     Per Dr. Leatrice Spire note from 9.9.2025:  Assessment & Plan:   Hypertensive urgency - Wife had reported on admission that some medications were recently discontinued notably Norvasc  due to low blood pressures outpatient - BP elevated on admission and responded well to reinitiation with amlodipine    Urinary retention - CT on admission noted stool-filled rectum with rectal wall thickening concerning for underlying stercoral colitis which may be contributing to urinary retention along with his behavioral changes and agitation - Catheter placed in the ER and has been in place for approximately 6 days - Urine  culture negative; UA on admission was noted with moderate LE, negative nitrite, 21-50 WBC, rare bacteria - Continue Flomax  - Has not yet had a trial of void and reasonable to perform prior to discharge given difficulty with outpatient urology follow-up unless necessary - Discontinue Foley today, 9/9 and continue serial bladder scans; if does have recurrent retention, will need Foley replaced and would then be appropriate for outpatient urology follow-up   Stercoral colitis - No impaction noted on DRE per ED PA. - continue laxatives    Dementia with behavioral disturbance Acute metabolic encephalopathy -Evaluated by psychiatry as well, appreciate assistance.  Transitioned from Seroquel  to Rexulti  - Could continue up titration of Rexulti  outpatient depending on behavioral response -Continue Depakote , Ativan  -Continue with sitter -Wife reports he has significant sundowning   Hypothyroidism - Continue with Synthroid    Lung nodules -Needs CT chest in 3 to 6 months   Interval History:  Resting in bed sleeping this morning but awakens easily.  Wife and sitter present bedside. Was able to get him to wake up for me but appeared rather somnolent.  Does follow commands and moves all 4 extremities. Wife mostly reporting that he has sundowning significantly in the evenings.     Old records reviewed in assessment of this patient   Antimicrobials:     DVT prophylaxis:  enoxaparin  (LOVENOX ) injection 40 mg Start: 04/26/24 1000     Code Status:   Code Status: Limited: Do not attempt resuscitation (DNR) -DNR-LIMITED -Do Not Intubate/DNI    Mobility Assessment (Last 72 Hours)       Mobility Assessment  Row Name 04/30/24 2010 04/30/24 0800 04/29/24 2000 04/29/24 0800 04/28/24 2200    Does the patient have exclusion criteria? No - Perform mobility assessment No - Perform mobility assessment No - Perform mobility assessment No - Perform mobility assessment No - Perform mobility assessment     What is the highest level of mobility based on the mobility assessment? Level 3 (Stands with assistance) - Balance while standing  and cannot march in place Level 4 (Ambulates with assistance) - Balance while stepping forward/back - Complete Level 1 (Bedfast) - Unable to balance while sitting on edge of bed Level 1 (Bedfast) - Unable to balance while sitting on edge of bed Level 1 (Bedfast) - Unable to balance while sitting on edge of bed    Is the above level different from baseline mobility prior to current illness? Yes - Recommend PT order Yes - Recommend PT order Yes - Recommend PT order Yes - Recommend PT order Yes - Recommend PT order        Barriers to discharge: None Disposition Plan: [facility] HH orders placed:  Status is: Patient   Objective: Blood pressure (!) 164/69, pulse 71, temperature 97.9 F (36.6 C), temperature source Oral, resp. rate 16, height 5' 10 (1.778 m), weight 67.5 kg, SpO2 96%.  Examination:  Physical Exam Constitutional:      General: He is not in acute distress.    Comments: Chronically ill-appearing elderly gentleman lying in bed in no distress.  Awakens easily but difficulty opening eyes  HENT:     Head: Normocephalic and atraumatic.     Mouth/Throat:     Mouth: Mucous membranes are moist.  Eyes:     Extraocular Movements: Extraocular movements intact.  Cardiovascular:     Rate and Rhythm: Normal rate and regular rhythm.  Pulmonary:     Effort: Pulmonary effort is normal. No respiratory distress.     Breath sounds: Normal breath sounds. No wheezing.  Abdominal:     General: Bowel sounds are normal. There is no distension.     Palpations: Abdomen is soft.     Tenderness: There is no abdominal tenderness.  Genitourinary:    Comments: Foley in place with clear yellow urine Musculoskeletal:        General: Normal range of motion.     Cervical back: Normal range of motion and neck supple.  Skin:    General: Skin is warm and dry.   Neurological:     Comments: Follows commands and moves all 4 extremities.  Slightly rigid upper extremities       Consultants:  Psychiatry   Procedures:      Data Reviewed: Lab Results Last 24 Hours  No results found for this or any previous visit (from the past 24 hours).    I have reviewed pertinent nursing notes, vitals, labs, and images as necessary. I have ordered labwork to follow up on as indicated.  I have reviewed the last notes from staff over past 24 hours. I have discussed patient's care plan and test results with nursing staff, CM/SW, and other staff as appropriate.   Time spent: Greater than 50% of the 55 minute visit was spent in counseling/coordination of care for the patient as laid out in the A&P.    LOS: 2 days  Discharge Planning: TBD, medical team monitoring patient after medication changes.   Family contact: Met with wife at the bedside along with RN Chick to discuss possible LTC placement. TOC Christina following up with patient's  wife.   IDT: updated   Goals of care: DNR Limited   Should patient need ambulance transport, please use [facility] as they contract this service for our active hospice patients.   Please call with any hospice questions or concerns.   Greig Basket, BSN, RN [facility] Liaison [phone]

## 2024-05-02 NOTE — Progress Notes (Signed)
 Progress Note    Julian [name]   FMW:[id number]  DOB: [birth year]  DOA: 04/25/2024     3 PCP: Loreli Elsie [name], MD  Initial CC: Agitation  Hospital Course: Mr. Remer is a 89 yo male with PMH advanced Alzheimer's dementia with behavioral disturbance, HTN, HLD, hypothyroidism who presented with increased agitation. He was admitted from [facility]. He underwent further workup for contributing etiology of his agitation. CT angio chest/abdomen/pelvis was performed.  He was noted to have a dilated stool-filled rectum with rectal wall thickening concerning for fecal impaction and stercoral colitis. He also had significant urinary retention requiring Foley catheter placement in the ER with 1 L output immediately.  Catheter was continued during hospitalization.  Psychiatry was also consulted given his severe agitation.  He was transitioned from Seroquel  to Rexulti .  Remainder of psychotropic medications were continued.     Assessment & Plan:   Urinary retention - CT on admission noted stool-filled rectum with rectal wall thickening concerning for underlying stercoral colitis which may be contributing to urinary retention along with his behavioral changes and agitation - Catheter placed in the ER and has been in place for approximately 6 days - Urine culture negative; UA on admission was noted with moderate LE, negative nitrite, 21-50 WBC, rare bacteria - Continue Flomax  -TOV started on 05/01/2024.  Slowly retained urine overnight with large retention morning of 05/02/2024 - performing straight cath x 1 today, 9/10.  If does fail again and, will replace Foley and pursue urology definitively outpatient for repeat TOV -Continue bladder scans Q6H  Physical deconditioning - Patient has had significant physical decline during hospitalization especially now with prolonged hospitalization.  Wife reports that he would ambulate independently at [facility] to meals and back.  Currently he is now a  max/total assist and not safe for discharging back to memory care independently.  Agree that he needs skilled nursing and 24/7 care. Unsafe discharge otherwise and will continue hospitalization until safer dispo plan  Dementia with behavioral disturbance Acute metabolic encephalopathy -Evaluated by psychiatry as well, appreciate assistance.  Transitioned from Seroquel  to Rexulti  - Could continue up titration of Rexulti  outpatient depending on behavioral response -Continue Depakote , Ativan  -Continue with sitter -Has had some sundowning but appears to have at least improved since yesterday -Given that he needs SNF, will need to continue adjusting regimen as necessary to achieve good behavorial control   Stercoral colitis - No impaction noted on DRE per ED PA. - BM have improved - continue laxatives   Hypertensive urgency -resolved - Wife had reported on admission that some medications were recently discontinued notably Norvasc  due to low blood pressures outpatient - BP elevated on admission and responded well to reinitiation with amlodipine    Hypothyroidism - Continue with Synthroid    Lung nodules -Needs CT chest in 3 to 6 months  Interval History:  No events overnight.  No major reports of sundowning and this morning he is resting comfortably in bed.  Ate breakfast well.  Sitter and wife at bedside.   Old records reviewed in assessment of this patient  Antimicrobials:   DVT prophylaxis:  enoxaparin  (LOVENOX ) injection 40 mg Start: 04/26/24 1000   Code Status:   Code Status: Limited: Do not attempt resuscitation (DNR) -DNR-LIMITED -Do Not Intubate/DNI   Mobility Assessment (Last 72 Hours)     Mobility Assessment     Row Name 05/02/24 1450 05/01/24 2034 05/01/24 0930 04/30/24 2010 04/30/24 0800   Does the patient have exclusion criteria? -- No -  Perform mobility assessment No - Perform mobility assessment No - Perform mobility assessment No - Perform mobility assessment    What is the highest level of mobility based on the mobility assessment? Level 4 (Ambulates with assistance) - Balance while stepping forward/back - Complete Level 3 (Stands with assistance) - Balance while standing  and cannot march in place Level 3 (Stands with assistance) - Balance while standing  and cannot march in place Level 3 (Stands with assistance) - Balance while standing  and cannot march in place Level 4 (Ambulates with assistance) - Balance while stepping forward/back - Complete   Is the above level different from baseline mobility prior to current illness? -- Yes - Recommend PT order Yes - Recommend PT order Yes - Recommend PT order Yes - Recommend PT order    Row Name 04/29/24 2000           Does the patient have exclusion criteria? No - Perform mobility assessment       What is the highest level of mobility based on the mobility assessment? Level 1 (Bedfast) - Unable to balance while sitting on edge of bed       Is the above level different from baseline mobility prior to current illness? Yes - Recommend PT order          Barriers to discharge: None Disposition Plan: TBD HH orders placed:  Status is: Patient  Objective: Blood pressure (!) 157/74, pulse 69, temperature 97.8 F (36.6 C), temperature source Oral, resp. rate 18, height 5' 10 (1.778 m), weight 67.5 kg, SpO2 98%.  Examination:  Physical Exam Constitutional:      General: He is not in acute distress.    Comments: Chronically ill-appearing elderly gentleman lying in bed in no distress.  Awakens easily but difficulty opening eyes  HENT:     Head: Normocephalic and atraumatic.     Mouth/Throat:     Mouth: Mucous membranes are moist.  Eyes:     Extraocular Movements: Extraocular movements intact.  Cardiovascular:     Rate and Rhythm: Normal rate and regular rhythm.  Pulmonary:     Effort: Pulmonary effort is normal. No respiratory distress.     Breath sounds: Normal breath sounds. No wheezing.  Abdominal:      General: Bowel sounds are normal. There is no distension.     Palpations: Abdomen is soft.     Tenderness: There is no abdominal tenderness.  Musculoskeletal:        General: Normal range of motion.     Cervical back: Normal range of motion and neck supple.  Skin:    General: Skin is warm and dry.  Neurological:     Comments: Follows commands and moves all 4 extremities.  Slightly rigid upper extremities      Consultants:  Psychiatry  Procedures:    Data Reviewed: No results found for this or any previous visit (from the past 24 hours).  I have reviewed pertinent nursing notes, vitals, labs, and images as necessary. I have ordered labwork to follow up on as indicated.  I have reviewed the last notes from staff over past 24 hours. I have discussed patient's care plan and test results with nursing staff, CM/SW, and other staff as appropriate.  Time spent: Greater than 50% of the 55 minute visit was spent in counseling/coordination of care for the patient as laid out in the A&P.   LOS: 3 days   Alm Apo, MD [facility] 05/02/2024, 3:55 PM

## 2024-05-02 NOTE — TOC Progression Note (Signed)
 Transition of Care [facility]) - Progression Note    Patient Details  Name: Julian [name] MRN: [id number] Date of Birth: 06-May-1926  Transition of Care [facility]) CM/SW Contact  [name], LCSW Phone Number: 05/02/2024, 12:01 PM  Clinical Narrative:     CSW spoke with the pt's spouse to discuss concerns regarding the patient's placement. She expressed concerns that [facility] may not have the appropriate staffing to care for the pt. She is inquiring about a LTC skilled nursing facility instead. CSW inquired whether the pt has Medicaid in place; the pt's spouse stated that he does not. CSW informed her that the pt would need to private pay for care. The pt's spouse stated I will do my best. CSW also informed her of the barriers to finding LTC placement. IP Care Management to follow.     Expected Discharge Plan:  (TBD) Barriers to Discharge: Continued Medical Work up               Expected Discharge Plan and Services                                               Social Drivers of Health (SDOH) Interventions SDOH Screenings   Food Insecurity: Patient Unable To Answer (04/26/2024)  Housing: Unknown (04/26/2024)  Transportation Needs: Patient Unable To Answer (04/26/2024)  Utilities: Patient Unable To Answer (04/26/2024)  Social Connections: Patient Unable To Answer (04/26/2024)  Tobacco Use: Medium Risk (04/26/2024)    Readmission Risk Interventions     No data to display

## 2024-05-02 NOTE — Plan of Care (Signed)
   Problem: Clinical Measurements: Goal: Will remain free from infection Outcome: Progressing Goal: Diagnostic test results will improve Outcome: Progressing

## 2024-05-02 NOTE — Plan of Care (Signed)
  Problem: Nutrition: Goal: Adequate nutrition will be maintained Outcome: Progressing   Problem: Clinical Measurements: Goal: Cardiovascular complication will be avoided Outcome: Progressing   Problem: Clinical Measurements: Goal: Respiratory complications will improve Outcome: Progressing   Problem: Skin Integrity: Goal: Risk for impaired skin integrity will decrease Outcome: Progressing

## 2024-05-02 NOTE — Progress Notes (Signed)
 Occupational Therapy Treatment Patient Details Name: Julian [name] MRN: [id number] DOB: [birth year] Today's Date: 05/02/2024   History of present illness 89 year old with who presented with increased confusion and agitation. Past history significant for Alzheimer dementia, behavioral disturbance, hypertension, hyperlipidemia, hypothyroid    OT comments  The pt was seen for ADL instruction, functional strengthening, and cognitive training. He was assisted into sitting EOB where hhe required max assist for upper body dressing and min assist for face washing. He further required min assist to stand from the EOB, then mod assist to step-pivot to the bedside chair. He was cooperative and pleasantly confused throughout the session. He presented with good effort, though he needed frequent assist and cues for re-direction to tasks, problem solving, sequencing and sustained attention. Continue OT plan of care. SNF recommended at discharge.       If plan is discharge home, recommend the following:  A lot of help with walking and/or transfers;A lot of help with bathing/dressing/bathroom;Supervision due to cognitive status;Assistance with feeding;Direct supervision/assist for medications management;Direct supervision/assist for financial management   Equipment Recommendations  None recommended by OT    Recommendations for Other Services      Precautions / Restrictions Precautions Precautions: Fall Restrictions Weight Bearing Restrictions Per Provider Order: No       Mobility Bed Mobility Overal bed mobility: Needs Assistance       Supine to sit: Mod assist          Transfers Overall transfer level: Needs assistance   Transfers: Sit to/from Stand, Bed to chair/wheelchair/BSC Sit to Stand: Min assist, From elevated surface     Step pivot transfers: Mod assist     General transfer comment: Pt required general cues for safety awareness, given his decreased insight and attempts to  perform task in a hurried manner     Balance     Sitting balance-Leahy Scale: Fair       Standing balance-Leahy Scale: Poor           ADL either performed or assessed with clinical judgement   ADL Overall ADL's : Needs assistance/impaired Eating/Feeding: Minimal assistance;Sitting Eating/Feeding Details (indicate cue type and reason): The pt drank from a cup seated in the chair. OT placed the cup in his hand and he was able to bring it to his mouth for drinking after a verbal cue was provided for initiation. Grooming: Sitting;Minimal assistance Grooming Details (indicate cue type and reason): The pt performed face washing seated in the bedside chair. The washclothe was placed in his hand and he subsequently initiated the task.         Upper Body Dressing : Cueing for safety;Cueing for sequencing;Maximal assistance Upper Body Dressing Details (indicate cue type and reason): He required assist to doff a hospital gown, then to donn another clean one while seated EOB. He mostly required cues for attention, sequencing and problem solving, due to his impaired cognition.                            Communication Communication Factors Affecting Communication: Difficulty expressing self   Cognition Arousal: Alert Behavior During Therapy: Impulsive Cognition: Cognition impaired, History of cognitive impairments   Orientation impairments: Place, Time, Situation Awareness: Intellectual awareness impaired, Online awareness impaired Memory impairment (select all impairments): Short-term memory, Working [employer] fast streamer, Conservation officer, historic buildings Attention impairment (select first level of impairment): Divided attention, Alternating attention, Sustained attention Executive functioning impairment (select all impairments): Problem solving, Sequencing, Reasoning, Organization  Following commands: Impaired Following commands impaired: Follows one step commands  inconsistently      Cueing   Cueing Techniques: Verbal cues, Gestural cues, Tactile cues  Exercises              Pertinent Vitals/ Pain       Pain Assessment Pain Assessment: Faces Pain Score: 0-No pain   Frequency  Min 2X/week        Progress Toward Goals  OT Goals(current goals can now be found in the care plan section)     Acute Rehab OT Goals OT Goal Formulation: Patient unable to participate in goal setting Time For Goal Achievement: 05/11/24 Potential to Achieve Goals: Fair  Plan         AM-PAC OT 6 Clicks Daily Activity     Outcome Measure   Help from another person eating meals?: A Little Help from another person taking care of personal grooming?: A Lot Help from another person toileting, which includes using toliet, bedpan, or urinal?: A Lot Help from another person bathing (including washing, rinsing, drying)?: A Lot Help from another person to put on and taking off regular upper body clothing?: A Lot Help from another person to put on and taking off regular lower body clothing?: A Lot 6 Click Score: 13    End of Session Equipment Utilized During Treatment: Other (comment)  OT Visit Diagnosis: Feeding difficulties (R63.3);Other symptoms and signs involving cognitive function;Muscle weakness (generalized) (M62.81);Other abnormalities of gait and mobility (R26.89)   Activity Tolerance Patient tolerated treatment well   Patient Left in chair;with call bell/phone within reach;with nursing/sitter in room;with chair alarm set   Nurse Communication Mobility status        Time: 8746-8688 OT Time Calculation (min): 18 min  Charges: OT General Charges $OT Visit: 1 Visit OT Treatments $Self Care/Home Management : 8-22 mins     [name], OTR/L 05/02/2024, 2:52 PM

## 2024-05-03 DIAGNOSIS — F03918 Unspecified dementia, unspecified severity, with other behavioral disturbance: Secondary | ICD-10-CM | POA: Diagnosis not present

## 2024-05-03 DIAGNOSIS — R338 Other retention of urine: Secondary | ICD-10-CM | POA: Diagnosis not present

## 2024-05-03 DIAGNOSIS — I160 Hypertensive urgency: Secondary | ICD-10-CM | POA: Diagnosis not present

## 2024-05-03 NOTE — TOC Progression Note (Signed)
 Transition of Care [facility]) - Progression Note    Patient Details  Name: Maveryck Bahri MRN: [id number] Date of Birth: 1926/08/23  [facility]) CM/SW Contact  [name], LCSW Phone Number: 05/03/2024, 12:46 PM  Clinical Narrative:     CSW spoke with the patient's spouse to inform her of pt's only bed offer at [facility]. The patient's spouse declined the offer, stating that a family member had passed away at that facility. CSW explained that the patient has received over 12 declines and is still awaiting responses from other facilities. CSW reports that the patient is currently under review by two additional facilities for potential placement. CSW has emailed the patient a list of the denials and the facilities that are still considering placement. Inpatient care management to follow  Expected Discharge Plan:  (TBD) Barriers to Discharge: Continued Medical Work up               Expected Discharge Plan and Services                                               Social Drivers of Health (SDOH) Interventions SDOH Screenings   Food Insecurity: Patient Unable To Answer (04/26/2024)  Housing: Unknown (04/26/2024)  Transportation Needs: Patient Unable To Answer (04/26/2024)  Utilities: Patient Unable To Answer (04/26/2024)  Social Connections: Patient Unable To Answer (04/26/2024)  Tobacco Use: Medium Risk (04/26/2024)    Readmission Risk Interventions     No data to display

## 2024-05-03 NOTE — Progress Notes (Signed)
 Progress Note    Ward Boissonneault   FMW:[id number]  DOB: [birth year]  DOA: 04/25/2024     4 PCP: Loreli Elsie [name], MD  Initial CC: Agitation  Hospital Course: [name] is a 89 yo male with PMH advanced Alzheimer's dementia with behavioral disturbance, HTN, HLD, hypothyroidism who presented with increased agitation. He was admitted from [facility]. He underwent further workup for contributing etiology of his agitation. CT angio chest/abdomen/pelvis was performed.  He was noted to have a dilated stool-filled rectum with rectal wall thickening concerning for fecal impaction and stercoral colitis. He also had significant urinary retention requiring Foley catheter placement in the ER with 1 L output immediately.  Catheter was continued during hospitalization.  Psychiatry was also consulted given his severe agitation.  He was transitioned from Seroquel  to Rexulti .  Remainder of psychotropic medications were continued.     Assessment & Plan:   Urinary retention - CT on admission noted stool-filled rectum with rectal wall thickening concerning for underlying stercoral colitis which may be contributing to urinary retention along with his behavioral changes and agitation - Catheter placed in the ER and has been in place for approximately 6 days - Urine culture negative; UA on admission was noted with moderate LE, negative nitrite, 21-50 WBC, rare bacteria - Continue Flomax  -TOV started on 05/01/2024.  Slowly retained urine overnight with large retention morning of 05/02/2024 - performing straight cath x 1 today, 9/10.  If does fail again and, will replace Foley and pursue urology definitively outpatient for repeat TOV -Continue bladder scans Q6H  Physical deconditioning - Patient has had significant physical decline during hospitalization especially now with prolonged hospitalization.  Wife reports that he would ambulate independently at [facility] to meals and back.  Currently he is now a  max/total assist and not safe for discharging back to memory care independently.   - Agree that he needs skilled nursing and 24/7 care. Unsafe discharge otherwise and will continue hospitalization until safer dispo plan  Dementia with behavioral disturbance Acute metabolic encephalopathy -Evaluated by psychiatry as well, appreciate assistance.  Transitioned from Seroquel  to Rexulti  - Could continue up titration of Rexulti  outpatient depending on behavioral response -Continue Depakote , Ativan  -Continue with sitter -Has had some sundowning but appears to have at least improved and remaining stable for now -Given that he needs SNF, will need to continue adjusting regimen as necessary to achieve good behavorial control   Stercoral colitis - No impaction noted on DRE per ED PA. - BM have improved - continue laxatives   Hypertensive urgency -resolved - Wife had reported on admission that some medications were recently discontinued notably Norvasc  due to low blood pressures outpatient - BP elevated on admission and responded well to reinitiation with amlodipine    Hypothyroidism - Continue with Synthroid    Lung nodules -Needs CT chest in 3 to 6 months  Interval History:  No events overnight.  Only mild retention for now on bladder scans. But no foley needed yet again.  Dispo still being sorted out. Wife overwhelmed at times, but seeking options for SNF/LTC placement.    Old records reviewed in assessment of this patient  Antimicrobials:   DVT prophylaxis:  enoxaparin  (LOVENOX ) injection 40 mg Start: 04/26/24 1000   Code Status:   Code Status: Limited: Do not attempt resuscitation (DNR) -DNR-LIMITED -Do Not Intubate/DNI   Mobility Assessment (Last 72 Hours)     Mobility Assessment     Row Name 05/03/24 0930 05/02/24 2100 05/02/24 1450 05/02/24 0900  05/01/24 2034   Does the patient have exclusion criteria? No - Perform mobility assessment (P)  No - Perform mobility assessment --  No - Perform mobility assessment (P)  No - Perform mobility assessment   What is the highest level of mobility based on the mobility assessment? Level 3 (Stands with assistance) - Balance while standing  and cannot march in place (P)  Level 3 (Stands with assistance) - Balance while standing  and cannot march in place Level 4 (Ambulates with assistance) - Balance while stepping forward/back - Complete Level 3 (Stands with assistance) - Balance while standing  and cannot march in place (P)  Level 3 (Stands with assistance) - Balance while standing  and cannot march in place   Is the above level different from baseline mobility prior to current illness? Yes - Recommend PT order (P)  Yes - Recommend PT order -- Yes - Recommend PT order (P)  Yes - Recommend PT order    Row Name 05/01/24 0930 04/30/24 2010         Does the patient have exclusion criteria? No - Perform mobility assessment No - Perform mobility assessment      What is the highest level of mobility based on the mobility assessment? Level 3 (Stands with assistance) - Balance while standing  and cannot march in place Level 3 (Stands with assistance) - Balance while standing  and cannot march in place      Is the above level different from baseline mobility prior to current illness? Yes - Recommend PT order Yes - Recommend PT order         Barriers to discharge: None Disposition Plan: TBD HH orders placed:  Status is: Inpt  Objective: Blood pressure 137/64, pulse 62, temperature 98 F (36.7 C), temperature source Oral, resp. rate 16, height 5' 10 (1.778 m), weight 67.5 kg, SpO2 93%.  Examination:  Physical Exam Constitutional:      General: He is not in acute distress.    Comments: Pleasant and cooperative.  More awake and alert today.  HENT:     Head: Normocephalic and atraumatic.     Mouth/Throat:     Mouth: Mucous membranes are moist.  Eyes:     Extraocular Movements: Extraocular movements intact.  Cardiovascular:     Rate  and Rhythm: Normal rate and regular rhythm.  Pulmonary:     Effort: Pulmonary effort is normal. No respiratory distress.     Breath sounds: Normal breath sounds. No wheezing.  Abdominal:     General: Bowel sounds are normal. There is no distension.     Palpations: Abdomen is soft.     Tenderness: There is no abdominal tenderness.  Musculoskeletal:        General: Normal range of motion.     Cervical back: Normal range of motion and neck supple.  Skin:    General: Skin is warm and dry.  Neurological:     Comments: Follows commands and moves all 4 extremities.  Underlying dementia appreciated      Consultants:  Psychiatry  Procedures:    Data Reviewed: No results found for this or any previous visit (from the past 24 hours).  I have reviewed pertinent nursing notes, vitals, labs, and images as necessary. I have ordered labwork to follow up on as indicated.  I have reviewed the last notes from staff over past 24 hours. I have discussed patient's care plan and test results with nursing staff, CM/SW, and other staff as appropriate.  Time spent: Greater than 50% of the 55 minute visit was spent in counseling/coordination of care for the patient as laid out in the A&P.   LOS: 4 days   Alm Apo, MD [facility] 05/03/2024, 2:17 PM

## 2024-05-03 NOTE — Progress Notes (Signed)
 Julian [name] [address] [facility] Liaison Note   [name] is a current patient with [facility] with a terminal diagnosis of cerebrovascular disease. Patient was at his facility on 09.03 when he became agitated and combative. He was taken to [facility] ED and was treated for urinary retention and elevated blood pressure. Patient was admitted 09.03.2025 for severe asymptomatic hypertension that was not relieved after Foley was placed. Psych evaluation has been ordered. Per Dr. Norleen Sella with [facility] this is a related hospital admission. Patient is a DNR.   Went to the bedside to meet with pts wife. Patient was sitting up in bed and his wife was feeding him breakfast. He was smiling and was very pleasant. Patient now has a condom catheter and is producing urine. MD states that he is stable medically but TOC is working on LTC placement.    Patient remains inpatient appropriate for skilled monitoring, assessment and medication titration and adjustment for complex symptom management.   V/S: 99.0/62/16  137/64   93% Room Air   I/O: 238/not documented   Abnormal Labs: no new labs   Diagnostics:  no new tests   IV/PRN medications:  Ativan  0.5mg  PO daily, Ativan  0.5mg  PO Q8H PRN x1,  Rexulti  0.5mg  PO daily     Per Dr. Alm Drilling note from 9.10.2025:    Assessment & Plan:   Urinary retention - CT on admission noted stool-filled rectum with rectal wall thickening concerning for underlying stercoral colitis which may be contributing to urinary retention along with his behavioral changes and agitation - Catheter placed in the ER and has been in place for approximately 6 days - Urine culture negative; UA on admission was noted with moderate LE, negative nitrite, 21-50 WBC, rare bacteria - Continue Flomax  -TOV started on 05/01/2024.  Slowly retained urine overnight with large retention morning of 05/02/2024 - performing straight cath x 1 today, 9/10.  If  does fail again and, will replace Foley and pursue urology definitively outpatient for repeat TOV -Continue bladder scans Q6H   Physical deconditioning - Patient has had significant physical decline during hospitalization especially now with prolonged hospitalization.  Wife reports that he would ambulate independently at [facility] to meals and back.  Currently he is now a max/total assist and not safe for discharging back to memory care independently.  Agree that he needs skilled nursing and 24/7 care. Unsafe discharge otherwise and will continue hospitalization until safer dispo plan   Dementia with behavioral disturbance Acute metabolic encephalopathy -Evaluated by psychiatry as well, appreciate assistance.  Transitioned from Seroquel  to Rexulti  - Could continue up titration of Rexulti  outpatient depending on behavioral response -Continue Depakote , Ativan  -Continue with sitter -Has had some sundowning but appears to have at least improved since yesterday -Given that he needs SNF, will need to continue adjusting regimen as necessary to achieve good behavorial control    Stercoral colitis - No impaction noted on DRE per ED PA. - BM have improved - continue laxatives    Hypertensive urgency -resolved - Wife had reported on admission that some medications were recently discontinued notably Norvasc  due to low blood pressures outpatient - BP elevated on admission and responded well to reinitiation with amlodipine    Hypothyroidism - Continue with Synthroid    Lung nodules -Needs CT chest in 3 to 6 months   Interval History:  No events overnight.  No major reports of sundowning and this morning he is resting comfortably in bed.  Ate breakfast well.  Sitter and wife at bedside.     Old records reviewed in assessment of this patient   Antimicrobials:     DVT prophylaxis:  enoxaparin  (LOVENOX ) injection 40 mg Start: 04/26/24 1000     Code Status:   Code Status: Limited: Do not  attempt resuscitation (DNR) -DNR-LIMITED -Do Not Intubate/DNI    Mobility Assessment (Last 72 Hours)       Mobility Assessment       Row Name 05/02/24 1450 05/01/24 2034 05/01/24 0930 04/30/24 2010 04/30/24 0800    Does the patient have exclusion criteria? -- No - Perform mobility assessment No - Perform mobility assessment No - Perform mobility assessment No - Perform mobility assessment    What is the highest level of mobility based on the mobility assessment? Level 4 (Ambulates with assistance) - Balance while stepping forward/back - Complete Level 3 (Stands with assistance) - Balance while standing  and cannot march in place Level 3 (Stands with assistance) - Balance while standing  and cannot march in place Level 3 (Stands with assistance) - Balance while standing  and cannot march in place Level 4 (Ambulates with assistance) - Balance while stepping forward/back - Complete    Is the above level different from baseline mobility prior to current illness? -- Yes - Recommend PT order Yes - Recommend PT order Yes - Recommend PT order Yes - Recommend PT order      Row Name 04/29/24 2000                    Does the patient have exclusion criteria? No - Perform mobility assessment            What is the highest level of mobility based on the mobility assessment? Level 1 (Bedfast) - Unable to balance while sitting on edge of bed            Is the above level different from baseline mobility prior to current illness? Yes - Recommend PT order                Barriers to discharge: None Disposition Plan: TBD HH orders placed:  Status is: Patient   Objective: Blood pressure (!) 157/74, pulse 69, temperature 97.8 F (36.6 C), temperature source Oral, resp. rate 18, height 5' 10 (1.778 m), weight 67.5 kg, SpO2 98%.  Examination:  Physical Exam Constitutional:      General: He is not in acute distress.    Comments: Chronically ill-appearing elderly gentleman lying in bed in no distress.   Awakens easily but difficulty opening eyes  HENT:     Head: Normocephalic and atraumatic.     Mouth/Throat:     Mouth: Mucous membranes are moist.  Eyes:     Extraocular Movements: Extraocular movements intact.  Cardiovascular:     Rate and Rhythm: Normal rate and regular rhythm.  Pulmonary:     Effort: Pulmonary effort is normal. No respiratory distress.     Breath sounds: Normal breath sounds. No wheezing.  Abdominal:     General: Bowel sounds are normal. There is no distension.     Palpations: Abdomen is soft.     Tenderness: There is no abdominal tenderness.  Musculoskeletal:        General: Normal range of motion.     Cervical back: Normal range of motion and neck supple.  Skin:    General: Skin is warm and dry.  Neurological:     Comments: Follows commands and moves all 4  extremities.  Slightly rigid upper extremities       Consultants:  Psychiatry   Procedures:      Data Reviewed: Lab Results Last 24 Hours  No results found for this or any previous visit (from the past 24 hours).    I have reviewed pertinent nursing notes, vitals, labs, and images as necessary. I have ordered labwork to follow up on as indicated.  I have reviewed the last notes from staff over past 24 hours. I have discussed patient's care plan and test results with nursing staff, CM/SW, and other staff as appropriate.   Time spent: Greater than 50% of the 55 minute visit was spent in counseling/coordination of care for the patient as laid out in the A&P.    LOS: 3 days    Alm Apo, MD              Discharge Planning: TBD, medical team monitoring patient after medication changes.   Family contact: Met with wife at the bedside along with RN Chick to discuss possible LTC placement. TOC Christina following up with patient's wife.   IDT: updated   Goals of care: DNR Limited   Should patient need ambulance transport, please use [facility] as they contract this service for our  active hospice patients.   Please call with any hospice questions or concerns.   Greig Basket, BSN, RN [facility] Liaison [phone]

## 2024-05-03 NOTE — Plan of Care (Signed)
  Problem: Nutrition: Goal: Adequate nutrition will be maintained Outcome: Progressing   Problem: Elimination: Goal: Will not experience complications related to bowel motility Outcome: Progressing   Problem: Pain Managment: Goal: General experience of comfort will improve and/or be controlled Outcome: Progressing   Problem: Safety: Goal: Ability to remain free from injury will improve Outcome: Progressing

## 2024-05-04 DIAGNOSIS — F03918 Unspecified dementia, unspecified severity, with other behavioral disturbance: Secondary | ICD-10-CM | POA: Diagnosis not present

## 2024-05-04 DIAGNOSIS — R338 Other retention of urine: Secondary | ICD-10-CM | POA: Diagnosis not present

## 2024-05-04 DIAGNOSIS — I160 Hypertensive urgency: Secondary | ICD-10-CM | POA: Diagnosis not present

## 2024-05-04 MED ORDER — CHLORHEXIDINE GLUCONATE CLOTH 2 % EX PADS
6.0000 | MEDICATED_PAD | Freq: Every day | CUTANEOUS | Status: DC
  Administered 2024-05-04 – 2024-05-21 (×17): 6 via TOPICAL

## 2024-05-04 NOTE — Progress Notes (Signed)
 Progress Note    Julian [name]   FMW:[id number]  DOB: 17-Apr-1926  DOA: 04/25/2024     5 PCP: Loreli Elsie [name], MD  Initial CC: Agitation  Hospital Course: Julian [name] is a 89 yo male with PMH advanced Alzheimer's dementia with behavioral disturbance, HTN, HLD, hypothyroidism who presented with increased agitation. He was admitted from [facility]. He underwent further workup for contributing etiology of his agitation. CT angio chest/abdomen/pelvis was performed.  He was noted to have a dilated stool-filled rectum with rectal wall thickening concerning for fecal impaction and stercoral colitis. He also had significant urinary retention requiring Foley catheter placement in the ER with 1 L output immediately.  Catheter was continued during hospitalization.  Psychiatry was also consulted given his severe agitation.  He was transitioned from Seroquel  to Rexulti .  Remainder of psychotropic medications were continued.     Assessment & Plan:   Urinary retention - CT on admission noted stool-filled rectum with rectal wall thickening concerning for underlying stercoral colitis which may be contributing to urinary retention along with his behavioral changes and agitation - Catheter placed in the ER and has been in place for approximately 6 days - Urine culture negative; UA on admission was noted with moderate LE, negative nitrite, 21-50 WBC, rare bacteria - Continue Flomax  -TOV started on 05/01/2024.  Slowly retained urine overnight with large retention morning of 05/02/2024 - performing straight cath x 1 today, 9/10.  If does fail again and, will replace Foley and pursue urology definitively outpatient for repeat TOV -Continue bladder scans Q6H  Physical deconditioning - Patient has had significant physical decline during hospitalization especially now with prolonged hospitalization.  Wife reports that he would ambulate independently at [facility] to meals and back.  Currently he is now a  max/total assist and not safe for discharging back to memory care independently.   - Agree that he needs skilled nursing and 24/7 care. Unsafe discharge otherwise and will continue hospitalization until safer dispo plan  Dementia with behavioral disturbance Acute metabolic encephalopathy -Evaluated by psychiatry as well, appreciate assistance.  Transitioned from Seroquel  to Rexulti  - Could continue up titration of Rexulti  outpatient depending on behavioral response -Continue Depakote , Ativan  -Continue with sitter -Has had some sundowning but appears to have at least improved and remaining stable for now -Given that he needs SNF, will need to continue adjusting regimen as necessary to achieve good behavorial control   Stercoral colitis - No impaction noted on DRE per ED PA. - BM have improved - continue laxatives   Hypertensive urgency -resolved - Wife had reported on admission that some medications were recently discontinued notably Norvasc  due to low blood pressures outpatient - BP elevated on admission and responded well to reinitiation with amlodipine    Hypothyroidism - Continue with Synthroid    Lung nodules -Needs CT chest in 3 to 6 months  Interval History:  No events overnight.  Only mild retention for now on bladder scans. But no foley needed yet again.  Dispo still being sorted out. Wife overwhelmed at times, but seeking options for SNF/LTC placement.    Old records reviewed in assessment of this patient  Antimicrobials:   DVT prophylaxis:  enoxaparin  (LOVENOX ) injection 40 mg Start: 04/26/24 1000   Code Status:   Code Status: Limited: Do not attempt resuscitation (DNR) -DNR-LIMITED -Do Not Intubate/DNI   Mobility Assessment (Last 72 Hours)     Mobility Assessment     Row Name 05/04/24 1554 05/04/24 1400 05/04/24 0915 05/03/24 2200  05/03/24 0930   Does the patient have exclusion criteria? -- -- No - Perform mobility assessment No - Perform mobility assessment  No - Perform mobility assessment (P)    What is the highest level of mobility based on the mobility assessment? Level 4 (Ambulates with assistance) - Balance while stepping forward/back - Complete Level 4 (Ambulates with assistance) - Balance while stepping forward/back - Complete Level 3 (Stands with assistance) - Balance while standing  and cannot march in place Level 3 (Stands with assistance) - Balance while standing  and cannot march in place Level 3 (Stands with assistance) - Balance while standing  and cannot march in place (P)    Is the above level different from baseline mobility prior to current illness? -- -- Yes - Recommend PT order Yes - Recommend PT order Yes - Recommend PT order (P)     Row Name 05/02/24 2100 05/02/24 1450 05/02/24 0900 05/01/24 2034     Does the patient have exclusion criteria? No - Perform mobility assessment -- No - Perform mobility assessment (P)  No - Perform mobility assessment    What is the highest level of mobility based on the mobility assessment? Level 3 (Stands with assistance) - Balance while standing  and cannot march in place Level 4 (Ambulates with assistance) - Balance while stepping forward/back - Complete Level 3 (Stands with assistance) - Balance while standing  and cannot march in place (P)  Level 3 (Stands with assistance) - Balance while standing  and cannot march in place    Is the above level different from baseline mobility prior to current illness? Yes - Recommend PT order -- Yes - Recommend PT order (P)  Yes - Recommend PT order       Barriers to discharge: None Disposition Plan: TBD HH orders placed:  Status is: Inpt  Objective: Blood pressure (!) 125/53, pulse 65, temperature 98.7 F (37.1 C), temperature source Oral, resp. rate 20, height 5' 10 (1.778 m), weight 67.5 kg, SpO2 100%.  Examination:  Physical Exam Constitutional:      General: He is not in acute distress.    Comments: Pleasant and cooperative.  More awake and alert  today.  HENT:     Head: Normocephalic and atraumatic.     Mouth/Throat:     Mouth: Mucous membranes are moist.  Eyes:     Extraocular Movements: Extraocular movements intact.  Cardiovascular:     Rate and Rhythm: Normal rate and regular rhythm.  Pulmonary:     Effort: Pulmonary effort is normal. No respiratory distress.     Breath sounds: Normal breath sounds. No wheezing.  Abdominal:     General: Bowel sounds are normal. There is no distension.     Palpations: Abdomen is soft.     Tenderness: There is no abdominal tenderness.  Genitourinary:    Comments: Foley catheter in place with clear yellow urine in bag Musculoskeletal:        General: Normal range of motion.     Cervical back: Normal range of motion and neck supple.  Skin:    General: Skin is warm and dry.  Neurological:     Comments: Follows commands and moves all 4 extremities.  Underlying dementia appreciated      Consultants:  Psychiatry  Procedures:    Data Reviewed: No results found for this or any previous visit (from the past 24 hours).  I have reviewed pertinent nursing notes, vitals, labs, and images as necessary. I have ordered  labwork to follow up on as indicated.  I have reviewed the last notes from staff over past 24 hours. I have discussed patient's care plan and test results with nursing staff, CM/SW, and other staff as appropriate.  Time spent: Greater than 50% of the 55 minute visit was spent in counseling/coordination of care for the patient as laid out in the A&P.   LOS: 5 days   Alm Apo, MD [facility] 05/04/2024, 4:10 PM

## 2024-05-04 NOTE — Progress Notes (Signed)
 Occupational Therapy Treatment Patient Details Name: Julian [name] MRN: [id number] DOB: 1925-08-26 Today's Date: 05/04/2024   History of present illness 89 year old with past history significant for Alzheimer dementia, behavioral disturbance, hypertension, hyperlipidemia, hypothyroid who presents with increased agitation.   OT comments  The pt continues to be pleasant and agreeable to participation in therapy. He was seen for functional transfers, ADL instruction, and progression of functional activity. He required CGA to min assist for bed mobility, as well as for sit to stand using a RW. He further progressed to performing toileting at bathroom level and hand washing in standing at the sink. He required intermittent cues for safety awareness, sustained attention, memory/recall, sequencing, and problem solving. He is making gradual functional progress. Patient will benefit from continued inpatient follow up therapy, <3 hours/day. Continue OT plan of care.       If plan is discharge home, recommend the following:  A lot of help with bathing/dressing/bathroom;Supervision due to cognitive status;Assistance with feeding;Direct supervision/assist for medications management;Direct supervision/assist for financial management;A little help with walking and/or transfers   Equipment Recommendations  None recommended by OT    Recommendations for Other Services      Precautions / Restrictions Precautions Precautions: Fall Restrictions Weight Bearing Restrictions Per Provider Order: No       Mobility Bed Mobility Overal bed mobility: Needs Assistance Bed Mobility: Sit to Supine, Supine to Sit     Supine to sit: Contact guard Sit to supine: Min assist   General bed mobility comments: cues and increased time with pt requiring min A for B LE to return to bed    Transfers Overall transfer level: Needs assistance Equipment used: Rolling walker (2 wheels) Transfers: Sit to/from Stand Sit to  Stand: Contact guard assist          Balance     Sitting balance-Leahy Scale: Good       Standing balance-Leahy Scale: Fair                ADL either performed or assessed with clinical judgement   ADL Overall ADL's : Needs assistance/impaired Eating/Feeding: Minimal assistance;Sitting;Bed level Eating/Feeding Details (indicate cue type and reason): He required min assist to bring a cup to his mouth to drink from it on a couple instances. Grooming: Minimal assistance;Cueing for sequencing;Cueing for safety;Standing Grooming Details (indicate cue type and reason): The pt performed hand washing in standing at the sink, requiring cues for safety awareness, sustained attention, item identification and short-term recall/memory.         Upper Body Dressing : Moderate assistance;Sitting;Cueing for sequencing Upper Body Dressing Details (indicate cue type and reason): Pt required assist to donn a hospital gown around his back seated EOB.     Toilet Transfer: Cueing for sequencing;Rolling walker (2 wheels);Cueing for safety;Grab bars;Ambulation Toilet Transfer Details (indicate cue type and reason): The pt ambulated to and from the bathroom in his room using a RW. He required cues for walker placement and body positioning when transferring onto and off the toilet. Toileting- Clothing Manipulation and Hygiene: Cueing for sequencing;Cueing for safety;Moderate assistance Toileting - Clothing Manipulation Details (indicate cue type and reason): The pt's ability to perform toileting tasks at bathroom level was at least partly compromised, due to his impaired cognition. As such, he required cues for safety awareness, walker placement, and occasionally for sequencing.            Extremity/Trunk Assessment     Lower Extremity Assessment Lower Extremity Assessment: Overall WFL for tasks assessed  Communication Communication Communication: Impaired Factors  Affecting Communication: Hearing impaired;Difficulty expressing self   Cognition Arousal: Alert Behavior During Therapy: Impulsive Cognition: Cognition impaired, History of cognitive impairments   Orientation impairments: Place, Time, Situation Awareness: Intellectual awareness impaired, Online awareness impaired Memory impairment (select all impairments): Short-term memory, Working Civil Service fast streamer, Conservation officer, historic buildings Attention impairment (select first level of impairment): Divided attention, Sustained attention Executive functioning impairment (select all impairments): Organization, Problem solving OT - Cognition Comments: Followed 1 step commands ~50% of the time.          Following commands: Impaired Following commands impaired: Follows one step commands inconsistently      Cueing   Cueing Techniques: Verbal cues, Gestural cues, Tactile cues, Visual cues             Pertinent Vitals/ Pain       Pain Assessment Pain Assessment: No/denies pain   Frequency  Min 2X/week        Progress Toward Goals  OT Goals(current goals can now be found in the care plan section)  Progress towards OT goals: Progressing toward goals  Acute Rehab OT Goals OT Goal Formulation: Patient unable to participate in goal setting Time For Goal Achievement: 05/11/24 Potential to Achieve Goals: Good  Plan      Co-evaluation    PT/OT/SLP Co-Evaluation/Treatment: Yes Reason for Co-Treatment: Complexity of the patient's impairments (multi-system involvement);Necessary to address cognition/behavior during functional activity;For patient/therapist safety PT goals addressed during session: Mobility/safety with mobility;Balance;Proper use of DME OT goals addressed during session: ADL's and self-care;Proper use of Adaptive equipment and DME      AM-PAC OT 6 Clicks Daily Activity     Outcome Measure   Help from another person eating meals?: A Little Help from another person taking care of  personal grooming?: A Little Help from another person toileting, which includes using toliet, bedpan, or urinal?: A Lot Help from another person bathing (including washing, rinsing, drying)?: A Lot Help from another person to put on and taking off regular upper body clothing?: A Lot Help from another person to put on and taking off regular lower body clothing?: A Lot 6 Click Score: 14    End of Session Equipment Utilized During Treatment: Gait belt;Rolling walker (2 wheels)  OT Visit Diagnosis: Feeding difficulties (R63.3);Other symptoms and signs involving cognitive function;Muscle weakness (generalized) (M62.81);Other abnormalities of gait and mobility (R26.89)   Activity Tolerance Patient tolerated treatment well   Patient Left in bed;with call bell/phone within reach;with bed alarm set;with nursing/sitter in room   Nurse Communication Mobility status        Time: 1325-1346 OT Time Calculation (min): 21 min  Charges: OT General Charges $OT Visit: 1 Visit OT Treatments $Self Care/Home Management : 8-22 mins   [name], OTR/L 05/04/2024, 3:57 PM

## 2024-05-04 NOTE — Progress Notes (Signed)
  [facility] Liaison Note  Julian [name] has revoked her husband's hospice Medicare benefit due to aggressive treatment. Please place order for outpatient palliative for him.  Please call with any hospice questions or concerns.   Greig Basket, BSN, RN [facility] Liaison [phone]

## 2024-05-04 NOTE — TOC Progression Note (Signed)
 Transition of Care [facility]) - Progression Note    Patient Details  Name: Junious Ragone MRN: [id number] Date of Birth: 1926-04-20  Transition of [facility] CM/SW Contact  [name], LCSW Phone Number: 05/04/2024, 9:41 AM  Clinical Narrative:     CSW spoke with the patient's spouse to discuss a bed offer at [facility]. The patient's wife declined, stating that her sister drove past the facility last night and didn't think it was safe for her to visit him at night. CSW explained this was the last place that offered pt a bed at this time. The patient's spouse is now requesting that the patient be placed in a locked unit. CSW explained that there are not many skilled nursing locked units in the area, and one of the few available units has already declined. CSW discussed making a referral to [facility] to assist with the placement process. IP care management to follow.  Expected Discharge Plan:  (TBD) Barriers to Discharge: Continued Medical Work up               Expected Discharge Plan and Services                                               Social Drivers of Health (SDOH) Interventions SDOH Screenings   Food Insecurity: Patient Unable To Answer (04/26/2024)  Housing: Unknown (04/26/2024)  Transportation Needs: Patient Unable To Answer (04/26/2024)  Utilities: Patient Unable To Answer (04/26/2024)  Social Connections: Patient Unable To Answer (04/26/2024)  Tobacco Use: Medium Risk (04/26/2024)    Readmission Risk Interventions     No data to display

## 2024-05-04 NOTE — Evaluation (Signed)
 Physical Therapy Re- evaluation Patient Details Name: Julian [name] MRN: [id number] DOB: 10/12/1925 Today's Date: 05/04/2024  History of Present Illness  89 year old with past history significant for Alzheimer dementia, behavioral disturbance, hypertension, hyperlipidemia, hypothyroid who presents with increased agitation.  Clinical Impression    PT re-evaluation today due to spouse electing to revoke pts hospice Medicare benefit.  Pt admitted with above diagnosis.  Pt currently with functional limitations due to the deficits listed below (see PT Problem List). Pt in bed when therapist arrived. Pt resting, easily roused and agreeable to therapy. Pt required CGA for supine to sit and min A for sit to supine with cues and increased time for all motor processing and planing. Pt is often impulsive and intrinsically motivated, no aggressive or perceived behaviors noted during intervention. Pt required CGA for transfer tasks with RW for bed and commode, pt exhibited improved gait tolerance with 120 feet in hallway on level surfaces with RW and cues. Pt returned to bed, all needs in place and sitter present.  Patient will benefit from continued inpatient follow up therapy, <3 hours/day. Pt will benefit from acute skilled PT to increase their independence and safety with mobility to allow discharge.         If plan is discharge home, recommend the following: Two people to help with walking and/or transfers;Two people to help with bathing/dressing/bathroom;Assistance with cooking/housework;Direct supervision/assist for financial management;Help with stairs or ramp for entrance;Assist for transportation;Assistance with feeding   Can travel by private vehicle        Equipment Recommendations None recommended by PT  Recommendations for Other Services       Functional Status Assessment Patient has had a recent decline in their functional status and demonstrates the ability to make significant improvements  in function in a reasonable and predictable amount of time.     Precautions / Restrictions Precautions Precautions: Fall Restrictions Weight Bearing Restrictions Per Provider Order: No      Mobility  Bed Mobility Overal bed mobility: Needs Assistance Bed Mobility: Rolling, Supine to Sit, Sit to Supine Rolling: Contact guard assist   Supine to sit: Contact guard Sit to supine: Min assist   General bed mobility comments: cues and increased time with pt requiring min A for B LE to return to bed    Transfers Overall transfer level: Needs assistance Equipment used: Rolling walker (2 wheels) Transfers: Sit to/from Stand Sit to Stand: Contact guard assist           General transfer comment: multimodal cues, increased time and use of RW for bed and commode transfers    Ambulation/Gait Ambulation/Gait assistance: Contact guard assist Gait Distance (Feet): 120 Feet Assistive device: Rolling walker (2 wheels) Gait Pattern/deviations: Step-to pattern, Trunk flexed Gait velocity: decreased     General Gait Details: cues for posture, proper distance from RW and body position inside RW with turns and approach to sitting surfaces  Stairs            Wheelchair Mobility     Tilt Bed    Modified Rankin (Stroke Patients Only)       Balance Overall balance assessment: Needs assistance Sitting-balance support: No upper extremity supported, Feet supported Sitting balance-Leahy Scale: Good     Standing balance support: Bilateral upper extremity supported, During functional activity, Reliant on assistive device for balance Standing balance-Leahy Scale: Fair Standing balance comment: static standing no UE support  Pertinent Vitals/Pain Pain Assessment Pain Assessment: No/denies pain (no pain report or apparent behaviors)    Home Living Family/patient expects to be discharged to:: Skilled nursing facility                    Additional Comments: pt is from [facility]    Prior Function Prior Level of Function : Needs assist  Cognitive Assist : ADLs (cognitive);Mobility (cognitive)     Physical Assist : ADLs (physical)   ADLs (physical): Bathing;Dressing;Toileting;IADLs Mobility Comments:  (Per pt's spouse, the pt was ambulatory without an assistive device up until the past couple weeks.) ADLs Comments: Per the pt's spouse, the care staff assisted the pt with bathing and dressing; the pt is typically incontinent, though he often attempts to get to the toilet without assistance at the facility. The pt is a former Arts development officer, Occupational hygienist, Camera operator.     Extremity/Trunk Assessment        Lower Extremity Assessment Lower Extremity Assessment: Overall WFL for tasks assessed    Cervical / Trunk Assessment Cervical / Trunk Assessment: Normal  Communication   Communication Communication: Impaired Factors Affecting Communication: Difficulty expressing self;Hearing impaired (hearing aids)    Cognition Arousal: Alert Behavior During Therapy: Impulsive   PT - Cognitive impairments: History of cognitive impairments                         Following commands: Impaired Following commands impaired: Follows one step commands inconsistently     Cueing Cueing Techniques: Verbal cues, Gestural cues, Tactile cues, Visual cues     General Comments      Exercises     Assessment/Plan    PT Assessment Patient needs continued PT services  PT Problem List Decreased activity tolerance;Decreased safety awareness;Decreased cognition       PT Treatment Interventions Functional mobility training;Gait training;Therapeutic activities;DME instruction;Therapeutic exercise;Balance training;Neuromuscular re-education;Cognitive remediation;Patient/family education    PT Goals (Current goals can be found in the Care Plan section)  Acute Rehab PT Goals PT Goal Formulation: Patient unable to  participate in goal setting    Frequency Min 2X/week     Co-evaluation PT/OT/SLP Co-Evaluation/Treatment: Yes Reason for Co-Treatment: Complexity of the patient's impairments (multi-system involvement);Necessary to address cognition/behavior during functional activity;For patient/therapist safety PT goals addressed during session: Mobility/safety with mobility;Balance;Proper use of DME OT goals addressed during session: ADL's and self-care;Proper use of Adaptive equipment and DME       AM-PAC PT 6 Clicks Mobility  Outcome Measure Help needed turning from your back to your side while in a flat bed without using bedrails?: None Help needed moving from lying on your back to sitting on the side of a flat bed without using bedrails?: A Little Help needed moving to and from a bed to a chair (including a wheelchair)?: A Little Help needed standing up from a chair using your arms (e.g., wheelchair or bedside chair)?: A Little Help needed to walk in hospital room?: A Little Help needed climbing 3-5 steps with a railing? : A Lot 6 Click Score: 18    End of Session Equipment Utilized During Treatment: Gait belt Activity Tolerance: Patient tolerated treatment well Patient left: with call bell/phone within reach;in bed;with nursing/sitter in room;with bed alarm set Nurse Communication: Mobility status PT Visit Diagnosis: Other abnormalities of gait and mobility (R26.89);Difficulty in walking, not elsewhere classified (R26.2)    Time: 8674-8653 PT Time Calculation (min) (ACUTE ONLY): 21 min   Charges:  PT Evaluation $PT Eval Low Complexity: 1 Low   PT General Charges $$ ACUTE PT VISIT: 1 Visit         [place], PT Acute Rehab   [place] VEAR Drone 05/04/2024, 2:26 PM

## 2024-05-05 DIAGNOSIS — F03918 Unspecified dementia, unspecified severity, with other behavioral disturbance: Secondary | ICD-10-CM | POA: Diagnosis not present

## 2024-05-05 DIAGNOSIS — I160 Hypertensive urgency: Secondary | ICD-10-CM | POA: Diagnosis not present

## 2024-05-05 DIAGNOSIS — R338 Other retention of urine: Secondary | ICD-10-CM | POA: Diagnosis not present

## 2024-05-05 NOTE — Progress Notes (Signed)
 Progress Note    Julian [name]   FMW:[id number]  DOB: Jan 09, 1926  DOA: 04/25/2024     6 PCP: Julian [name]., MD  Initial CC: Agitation  Hospital Course: [name] is a 89 yo male with PMH advanced Alzheimer's dementia with behavioral disturbance, HTN, HLD, hypothyroidism who presented with increased agitation. He was admitted from [facility]. He underwent further workup for contributing etiology of his agitation. CT angio chest/abdomen/pelvis was performed.  He was noted to have a dilated stool-filled rectum with rectal wall thickening concerning for fecal impaction and stercoral colitis. He also had significant urinary retention requiring Foley catheter placement in the ER with 1 L output immediately.  Catheter was continued during hospitalization.  Psychiatry was also consulted given his severe agitation.  He was transitioned from Seroquel  to Rexulti .  Remainder of psychotropic medications were continued.     Assessment & Plan:   Urinary retention - CT on admission noted stool-filled rectum with rectal wall thickening concerning for underlying stercoral colitis which may be contributing to urinary retention along with his behavioral changes and agitation - Catheter placed in the ER and has been in place for approximately 6 days - Urine culture negative; UA on admission was noted with moderate LE, negative nitrite, 21-50 WBC, rare bacteria - Continue Flomax  -TOV started on 05/01/2024.  Slowly retained urine overnight with large retention morning of 05/02/2024 - performed straight cath x 1 , 9/10.   - Serial bladder scans continued and failed again overnight on 05/03/2024 and Foley catheter replaced once again - Foley will remain at discharge and patient will follow-up outpatient with urology on 05/21/2024 for repeat TOV  Physical deconditioning - Patient has had significant physical decline during hospitalization especially now with prolonged hospitalization.  Wife reports that he  would ambulate independently at [facility] to meals and back.  Currently he is now a max/total assist and not safe for discharging back to memory care independently.   - does have some improvement over past few days which is great; will follow up plan with wife, but if barriers to SNF, quite possible that he could return to memory care or similar  Dementia with behavioral disturbance Acute metabolic encephalopathy -Evaluated by psychiatry as well, appreciate assistance.  Transitioned from Seroquel  to Rexulti  - Could continue up titration of Rexulti  outpatient depending on behavioral response -Continue Depakote , Ativan  -Continue with sitter -Has had some sundowning but appears to have at least improved and remaining stable for now -Given that he needs SNF, will need to continue adjusting regimen as necessary to achieve good behavorial control   Stercoral colitis - No impaction noted on DRE per ED PA. - BM have improved - continue laxatives   Hypertensive urgency -resolved - Wife had reported on admission that some medications were recently discontinued notably Norvasc  due to low blood pressures outpatient - BP elevated on admission and responded well to reinitiation with amlodipine    Hypothyroidism - Continue with Synthroid    Lung nodules -Needs CT chest in 3 to 6 months  Interval History:  No events overnight.  Continuing to improve a little bit in terms of strength and mobility.  Ambulating better with therapy each time.   Old records reviewed in assessment of this patient  Antimicrobials:   DVT prophylaxis:  enoxaparin  (LOVENOX ) injection 40 mg Start: 04/26/24 1000   Code Status:   Code Status: Limited: Do not attempt resuscitation (DNR) -DNR-LIMITED -Do Not Intubate/DNI   Mobility Assessment (Last 72 Hours)  Mobility Assessment     Row Name 05/05/24 [phone] 05/04/24 2237 05/04/24 1554 05/04/24 1400 05/04/24 0915   Does the patient have exclusion criteria? No -  Perform mobility assessment No - Perform mobility assessment -- -- No - Perform mobility assessment   What is the highest level of mobility based on the mobility assessment? Level 4 (Ambulates with assistance) - Balance while stepping forward/back - Complete Level 4 (Ambulates with assistance) - Balance while stepping forward/back - Complete Level 4 (Ambulates with assistance) - Balance while stepping forward/back - Complete Level 4 (Ambulates with assistance) - Balance while stepping forward/back - Complete Level 3 (Stands with assistance) - Balance while standing  and cannot march in place   Is the above level different from baseline mobility prior to current illness? Yes - Recommend PT order Yes - Recommend PT order -- -- Yes - Recommend PT order    Row Name 05/03/24 2200 05/03/24 0930 05/02/24 2100       Does the patient have exclusion criteria? No - Perform mobility assessment No - Perform mobility assessment (P)  No - Perform mobility assessment     What is the highest level of mobility based on the mobility assessment? Level 3 (Stands with assistance) - Balance while standing  and cannot march in place Level 3 (Stands with assistance) - Balance while standing  and cannot march in place (P)  Level 3 (Stands with assistance) - Balance while standing  and cannot march in place     Is the above level different from baseline mobility prior to current illness? Yes - Recommend PT order Yes - Recommend PT order (P)  Yes - Recommend PT order        Barriers to discharge: None Disposition Plan: TBD HH orders placed:  Status is: Inpt  Objective: Blood pressure 122/69, pulse 84, temperature 98.8 F (37.1 C), temperature source Oral, resp. rate 14, height 5' 10 (1.778 m), weight 67.5 kg, SpO2 100%.  Examination:  Physical Exam Constitutional:      General: He is not in acute distress.    Comments: Pleasant and cooperative.  More awake and alert today.  HENT:     Head: Normocephalic and  atraumatic.     Mouth/Throat:     Mouth: Mucous membranes are moist.  Eyes:     Extraocular Movements: Extraocular movements intact.  Cardiovascular:     Rate and Rhythm: Normal rate and regular rhythm.  Pulmonary:     Effort: Pulmonary effort is normal. No respiratory distress.     Breath sounds: Normal breath sounds. No wheezing.  Abdominal:     General: Bowel sounds are normal. There is no distension.     Palpations: Abdomen is soft.     Tenderness: There is no abdominal tenderness.  Genitourinary:    Comments: Foley catheter in place with clear yellow urine in bag Musculoskeletal:        General: Normal range of motion.     Cervical back: Normal range of motion and neck supple.  Skin:    General: Skin is warm and dry.  Neurological:     Comments: Follows commands and moves all 4 extremities.  Underlying dementia appreciated      Consultants:  Psychiatry  Procedures:    Data Reviewed: No results found for this or any previous visit (from the past 24 hours).  I have reviewed pertinent nursing notes, vitals, labs, and images as necessary. I have ordered labwork to follow up on as indicated.  I have reviewed the last notes from staff over past 24 hours. I have discussed patient's care plan and test results with nursing staff, CM/SW, and other staff as appropriate.  Time spent: Greater than 50% of the 55 minute visit was spent in counseling/coordination of care for the patient as laid out in the A&P.   LOS: 6 days   Alm Apo, MD [facility] 05/05/2024, 5:22 PM

## 2024-05-06 DIAGNOSIS — I160 Hypertensive urgency: Secondary | ICD-10-CM | POA: Diagnosis not present

## 2024-05-06 DIAGNOSIS — F03918 Unspecified dementia, unspecified severity, with other behavioral disturbance: Secondary | ICD-10-CM | POA: Diagnosis not present

## 2024-05-06 DIAGNOSIS — R338 Other retention of urine: Secondary | ICD-10-CM | POA: Diagnosis not present

## 2024-05-06 MED ORDER — HALOPERIDOL LACTATE 5 MG/ML IJ SOLN
5.0000 mg | Freq: Once | INTRAMUSCULAR | Status: AC
  Administered 2024-05-06: 5 mg via INTRAVENOUS
  Filled 2024-05-06: qty 1

## 2024-05-06 MED ORDER — BREXPIPRAZOLE 1 MG PO TABS
1.0000 mg | ORAL_TABLET | Freq: Every day | ORAL | Status: DC
  Administered 2024-05-07 – 2024-05-16 (×10): 1 mg via ORAL
  Filled 2024-05-06 (×10): qty 1

## 2024-05-06 NOTE — Plan of Care (Signed)
  Problem: Clinical Measurements: Goal: Will remain free from infection Outcome: Not Progressing   Problem: Activity: Goal: Risk for activity intolerance will decrease Outcome: Not Progressing   Problem: Pain Managment: Goal: General experience of comfort will improve and/or be controlled Outcome: Not Progressing   Problem: Safety: Goal: Ability to remain free from injury will improve Outcome: Not Progressing

## 2024-05-06 NOTE — Progress Notes (Signed)
 Progress Note    Julian [name]   FMW:[id number]  DOB: 07-31-1926  DOA: 04/25/2024     7 PCP: Loreli Elsie [name], MD  Initial CC: Agitation  Hospital Course: [name] is a 89 yo male with PMH advanced Alzheimer's dementia with behavioral disturbance, HTN, HLD, hypothyroidism who presented with increased agitation. He was admitted from [facility]. He underwent further workup for contributing etiology of his agitation. CT angio chest/abdomen/pelvis was performed.  He was noted to have a dilated stool-filled rectum with rectal wall thickening concerning for fecal impaction and stercoral colitis. He also had significant urinary retention requiring Foley catheter placement in the ER with 1 L output immediately.  Catheter was continued during hospitalization.  Psychiatry was also consulted given his severe agitation.  He was transitioned from Seroquel  to Rexulti .  Remainder of psychotropic medications were continued.     Assessment & Plan:   Urinary retention - CT on admission noted stool-filled rectum with rectal wall thickening concerning for underlying stercoral colitis which may be contributing to urinary retention along with his behavioral changes and agitation - Catheter placed in the ER and has been in place for approximately 6 days - Urine culture negative; UA on admission was noted with moderate LE, negative nitrite, 21-50 WBC, rare bacteria - Continue Flomax  -TOV started on 05/01/2024.  Slowly retained urine overnight with large retention morning of 05/02/2024 - performed straight cath x 1 , 9/10.   - Serial bladder scans continued and failed again overnight on 05/03/2024 and Foley catheter replaced once again - Foley will remain at discharge and patient will follow-up outpatient with urology on 05/21/2024 for repeat TOV  Physical deconditioning - Patient has had significant physical decline during hospitalization especially now with prolonged hospitalization.  Wife reports that he  would ambulate independently at [facility] to meals and back.  Currently he is now a max/total assist and not safe for discharging back to memory care independently.   - does have some improvement over past few days which is great; will follow up plan with wife, but if barriers to SNF, quite possible that he could return to memory care or similar  Dementia with behavioral disturbance Acute metabolic encephalopathy -Evaluated by psychiatry as well, appreciate assistance.  Transitioned from Seroquel  to Rexulti  - Could continue up titration of Rexulti  outpatient depending on behavioral response -Continue Depakote , Ativan  -Continue with sitter -Has had some sundowning but appears to have at least improved and remaining stable for now -Given that he needs SNF, will need to continue adjusting regimen as necessary to achieve good behavorial control   Stercoral colitis - No impaction noted on DRE per ED PA. - BM have improved - continue laxatives   Hypertensive urgency -resolved - Wife had reported on admission that some medications were recently discontinued notably Norvasc  due to low blood pressures outpatient - BP elevated on admission and responded well to reinitiation with amlodipine    Hypothyroidism - Continue with Synthroid    Lung nodules -Needs CT chest in 3 to 6 months  Interval History:  No events overnight.  Unable to have a sitter today but seems to be doing relatively okay.  Tele sitter ordered.  Needed a little bit of Haldol  this afternoon. Was feeding himself some breakfast this morning.   Old records reviewed in assessment of this patient  Antimicrobials:   DVT prophylaxis:  enoxaparin  (LOVENOX ) injection 40 mg Start: 04/26/24 1000   Code Status:   Code Status: Limited: Do not attempt resuscitation (DNR) -  DNR-LIMITED -Do Not Intubate/DNI   Mobility Assessment (Last 72 Hours)     Mobility Assessment     Row Name 05/06/24 0715 05/05/24 2000 05/05/24 0835  05/04/24 2237 05/04/24 1554   Does the patient have exclusion criteria? No - Perform mobility assessment No - Perform mobility assessment No - Perform mobility assessment No - Perform mobility assessment --   What is the highest level of mobility based on the mobility assessment? Level 4 (Ambulates with assistance) - Balance while stepping forward/back - Complete Level 4 (Ambulates with assistance) - Balance while stepping forward/back - Complete Level 4 (Ambulates with assistance) - Balance while stepping forward/back - Complete Level 4 (Ambulates with assistance) - Balance while stepping forward/back - Complete Level 4 (Ambulates with assistance) - Balance while stepping forward/back - Complete   Is the above level different from baseline mobility prior to current illness? Yes - Recommend PT order Yes - Recommend PT order Yes - Recommend PT order Yes - Recommend PT order --    Row Name 05/04/24 1400 05/04/24 0915 05/03/24 2200       Does the patient have exclusion criteria? -- No - Perform mobility assessment No - Perform mobility assessment     What is the highest level of mobility based on the mobility assessment? Level 4 (Ambulates with assistance) - Balance while stepping forward/back - Complete Level 3 (Stands with assistance) - Balance while standing  and cannot march in place Level 3 (Stands with assistance) - Balance while standing  and cannot march in place     Is the above level different from baseline mobility prior to current illness? -- Yes - Recommend PT order Yes - Recommend PT order        Barriers to discharge: None Disposition Plan: TBD HH orders placed:  Status is: Inpt  Objective: Blood pressure (!) 141/53, pulse 73, temperature 98.7 F (37.1 C), temperature source Oral, resp. rate 18, height 5' 10 (1.778 m), weight 67.5 kg, SpO2 97%.  Examination:  Physical Exam Constitutional:      General: He is not in acute distress.    Comments: Pleasant and cooperative.  More  awake and alert today.  HENT:     Head: Normocephalic and atraumatic.     Mouth/Throat:     Mouth: Mucous membranes are moist.  Eyes:     Extraocular Movements: Extraocular movements intact.  Cardiovascular:     Rate and Rhythm: Normal rate and regular rhythm.  Pulmonary:     Effort: Pulmonary effort is normal. No respiratory distress.     Breath sounds: Normal breath sounds. No wheezing.  Abdominal:     General: Bowel sounds are normal. There is no distension.     Palpations: Abdomen is soft.     Tenderness: There is no abdominal tenderness.  Genitourinary:    Comments: Foley catheter in place with clear yellow urine in bag Musculoskeletal:        General: Normal range of motion.     Cervical back: Normal range of motion and neck supple.  Skin:    General: Skin is warm and dry.  Neurological:     Comments: Follows commands and moves all 4 extremities.  Underlying dementia appreciated      Consultants:  Psychiatry  Procedures:    Data Reviewed: No results found for this or any previous visit (from the past 24 hours).  I have reviewed pertinent nursing notes, vitals, labs, and images as necessary. I have ordered labwork to follow up  on as indicated.  I have reviewed the last notes from staff over past 24 hours. I have discussed patient's care plan and test results with nursing staff, CM/SW, and other staff as appropriate.  Time spent: Greater than 50% of the 55 minute visit was spent in counseling/coordination of care for the patient as laid out in the A&P.   LOS: 7 days   Alm Apo, MD [facility] 05/06/2024, 3:05 PM

## 2024-05-07 DIAGNOSIS — I160 Hypertensive urgency: Secondary | ICD-10-CM | POA: Diagnosis not present

## 2024-05-07 DIAGNOSIS — F03918 Unspecified dementia, unspecified severity, with other behavioral disturbance: Secondary | ICD-10-CM | POA: Diagnosis not present

## 2024-05-07 DIAGNOSIS — R338 Other retention of urine: Secondary | ICD-10-CM | POA: Diagnosis not present

## 2024-05-07 MED ORDER — HALOPERIDOL LACTATE 5 MG/ML IJ SOLN
5.0000 mg | Freq: Four times a day (QID) | INTRAMUSCULAR | Status: DC | PRN
  Administered 2024-05-07 – 2024-05-20 (×21): 5 mg via INTRAVENOUS
  Filled 2024-05-07 (×20): qty 1

## 2024-05-07 NOTE — TOC Progression Note (Addendum)
 Transition of Care [facility]) - Progression Note    Patient Details  Name: Julian [name] MRN: [id number] Date of Birth: 09-12-1925  [facility] CM/SW Contact  [name], LCSW Phone Number: 05/07/2024, 9:02 AM  Clinical Narrative:     CSW spoke with admissions at both [facility] and [facility]; both facilities have declined the pt. At this time, the pt has no other bed offers aside from [facility] and [facility], both of which were declined by the pt's spouse. Inpatient Care Management to continue follow-up.   Adden  2:00pm CSW met with the pt's spouse, [name], to discuss the discharge plan. She was provided with an update on the pt's current status. [name] reported that she has discharged the patient from [facility] and has picked up his belongings. She stated that the pt will require a new memory care placement and is requesting a referral to [facility].  CSW later received a message from the MD indicating that the pt's spouse is now requesting a hospital bed and for the pt to return home with hospice services. DME orders will need to be faxed to the [facility] for the hospital bed to be delivered to the pt's home. IP care management to follow.   Expected Discharge Plan:  (TBD) Barriers to Discharge: Continued Medical Work up               Expected Discharge Plan and Services                                               Social Drivers of Health (SDOH) Interventions SDOH Screenings   Food Insecurity: Patient Unable To Answer (04/26/2024)  Housing: Unknown (04/26/2024)  Transportation Needs: Patient Unable To Answer (04/26/2024)  Utilities: Patient Unable To Answer (04/26/2024)  Social Connections: Patient Unable To Answer (04/26/2024)  Tobacco Use: Medium Risk (04/26/2024)    Readmission Risk Interventions     No data to display

## 2024-05-07 NOTE — Progress Notes (Signed)
 Progress Note    Julian [name]   FMW:[id number]  DOB: 01-Aug-1926  DOA: 04/25/2024     8 PCP: Loreli Elsie [name], MD  Initial CC: Agitation  Hospital Course: Mr. Colpitts is a 89 yo male with PMH advanced Alzheimer's dementia with behavioral disturbance, HTN, HLD, hypothyroidism who presented with increased agitation. He was admitted from [facility]. He underwent further workup for contributing etiology of his agitation. CT angio chest/abdomen/pelvis was performed.  He was noted to have a dilated stool-filled rectum with rectal wall thickening concerning for fecal impaction and stercoral colitis. He also had significant urinary retention requiring Foley catheter placement in the ER with 1 L output immediately.  Catheter was continued during hospitalization.  Psychiatry was also consulted given his severe agitation.  He was transitioned from Seroquel  to Rexulti .  Remainder of psychotropic medications were continued.     Assessment & Plan:   Urinary retention - CT on admission noted stool-filled rectum with rectal wall thickening concerning for underlying stercoral colitis which may be contributing to urinary retention along with his behavioral changes and agitation - Catheter placed in the ER and has been in place for approximately 6 days - Urine culture negative; UA on admission was noted with moderate LE, negative nitrite, 21-50 WBC, rare bacteria - Continue Flomax  -TOV started on 05/01/2024.  Slowly retained urine overnight with large retention morning of 05/02/2024 - performed straight cath x 1 , 9/10.   - Serial bladder scans continued and failed again overnight on 05/03/2024 and Foley catheter replaced once again - Foley will remain at discharge and patient will follow-up outpatient with urology on 05/21/2024 for repeat TOV  Physical deconditioning - Patient has had significant physical decline during hospitalization especially now with prolonged hospitalization.  Wife reports that he  would ambulate independently at [facility] to meals and back.  Currently he is now a max/total assist and not safe for discharging back to memory care independently.   - Physically has improved and potentially could go to memory care; unfortunately cannot return to [facility] and has been declined from other facilities; wife not wanting to place him in a nonlocked unit.  Leaving only other option is home with hospice which she is comfortable with.  Needing to get reestablished with hospice and arrange hospital bed and any other DME, then would be able to get him home but this process may take another few days  Dementia with behavioral disturbance Acute metabolic encephalopathy -Evaluated by psychiatry as well, appreciate assistance.  Transitioned from Seroquel  to Rexulti  - Could continue up titration of Rexulti  outpatient depending on behavioral response -Continue Depakote , Ativan  -Continue with sitter -Has had some sundowning but appears to have at least improved and remaining stable for now  Stercoral colitis - No impaction noted on DRE per ED PA. - BM have improved - continue laxatives   Hypertensive urgency -resolved - Wife had reported on admission that some medications were recently discontinued notably Norvasc  due to low blood pressures outpatient - BP elevated on admission and responded well to reinitiation with amlodipine    Hypothyroidism - Continue with Synthroid    Lung nodules -Needs CT chest in 3 to 6 months  Interval History:  No events overnight.  Seems to be doing well without a sitter currently.  Spoke with wife at bedside at length.  Unfortunately no good options for discharge aside from taking him home with hospice.  She is comfortable with that but trying to rearrange the bedroom and needs a  hospital bed and having to get help from the church to move the current bed out.  House is only 1 bedroom and she plans to sleep on the recliner. Next step is getting him  enrolled again with hospice so that hospital bed and DME can start to be arranged.  Old records reviewed in assessment of this patient  Antimicrobials:   DVT prophylaxis:  enoxaparin  (LOVENOX ) injection 40 mg Start: 04/26/24 1000   Code Status:   Code Status: Limited: Do not attempt resuscitation (DNR) -DNR-LIMITED -Do Not Intubate/DNI   Mobility Assessment (Last 72 Hours)     Mobility Assessment     Row Name 05/07/24 0900 05/06/24 2304 05/06/24 0715 05/05/24 2000 05/05/24 0835   Does the patient have exclusion criteria? No - Perform mobility assessment No - Perform mobility assessment No - Perform mobility assessment No - Perform mobility assessment No - Perform mobility assessment   What is the highest level of mobility based on the mobility assessment? Level 2 (Chairfast) - Balance while sitting on edge of bed and cannot stand Level 3 (Stands with assistance) - Balance while standing  and cannot march in place Level 4 (Ambulates with assistance) - Balance while stepping forward/back - Complete Level 4 (Ambulates with assistance) - Balance while stepping forward/back - Complete Level 4 (Ambulates with assistance) - Balance while stepping forward/back - Complete   Is the above level different from baseline mobility prior to current illness? Yes - Recommend PT order Yes - Recommend PT order Yes - Recommend PT order Yes - Recommend PT order Yes - Recommend PT order    Row Name 05/04/24 2237 05/04/24 1554         Does the patient have exclusion criteria? No - Perform mobility assessment --      What is the highest level of mobility based on the mobility assessment? Level 4 (Ambulates with assistance) - Balance while stepping forward/back - Complete Level 4 (Ambulates with assistance) - Balance while stepping forward/back - Complete      Is the above level different from baseline mobility prior to current illness? Yes - Recommend PT order --         Barriers to discharge:  None Disposition Plan: TBD; potentially home with hospice HH orders placed:  Status is: Inpt  Objective: Blood pressure (!) 142/58, pulse 78, temperature 97.9 F (36.6 C), temperature source Oral, resp. rate 16, height 5' 10 (1.778 m), weight 67.5 kg, SpO2 98%.  Examination:  Physical Exam Constitutional:      General: He is not in acute distress.    Comments: Pleasant and cooperative.  More awake and alert today.  HENT:     Head: Normocephalic and atraumatic.     Mouth/Throat:     Mouth: Mucous membranes are moist.  Eyes:     Extraocular Movements: Extraocular movements intact.  Cardiovascular:     Rate and Rhythm: Normal rate and regular rhythm.  Pulmonary:     Effort: Pulmonary effort is normal. No respiratory distress.     Breath sounds: Normal breath sounds. No wheezing.  Abdominal:     General: Bowel sounds are normal. There is no distension.     Palpations: Abdomen is soft.     Tenderness: There is no abdominal tenderness.  Genitourinary:    Comments: Foley catheter in place with clear yellow urine in bag Musculoskeletal:        General: Normal range of motion.     Cervical back: Normal range of motion and  neck supple.  Skin:    General: Skin is warm and dry.  Neurological:     Comments: Follows commands and moves all 4 extremities.  Underlying dementia appreciated      Consultants:  Psychiatry  Procedures:    Data Reviewed: No results found for this or any previous visit (from the past 24 hours).  I have reviewed pertinent nursing notes, vitals, labs, and images as necessary. I have ordered labwork to follow up on as indicated.  I have reviewed the last notes from staff over past 24 hours. I have discussed patient's care plan and test results with nursing staff, CM/SW, and other staff as appropriate.  Time spent: Greater than 50% of the 55 minute visit was spent in counseling/coordination of care for the patient as laid out in the A&P.   LOS: 8 days    Alm Apo, MD [facility] 05/07/2024, 3:13 PM

## 2024-05-07 NOTE — Progress Notes (Signed)
 Physical Therapy Treatment Patient Details Name: Julian [name] MRN: [id number] DOB: 1925-12-17 Today's Date: 05/07/2024   History of Present Illness 89 year old with past history significant for Alzheimer dementia, behavioral disturbance, hypertension, hyperlipidemia, hypothyroid who presents with increased agitation.    PT Comments  Pt sleeping upon my arrival. Family/visitor present during session. Pt agreeable to working with therapy with encouragement. He did well getting to EOB with cues. Sat EOB without LOB. He was unable to stand despite multiple attempts and Max cueing and assist form therapist-family attempted to cue him as well. Unable to get pt to safely stand up-he kept scooting towards EOB with each attempt, instead of standing. Due to fall risk, deferred any further attempts and assisted pt back into bed. Patient will benefit from continued inpatient follow up therapy, <3 hours/day, if family agreeable.     If plan is discharge home, recommend the following: Two people to help with walking and/or transfers;Two people to help with bathing/dressing/bathroom;Assistance with cooking/housework;Direct supervision/assist for financial management;Help with stairs or ramp for entrance;Assist for transportation;Assistance with feeding   Can travel by private vehicle        Equipment Recommendations  None recommended by PT    Recommendations for Other Services       Precautions / Restrictions Precautions Precautions: Fall Restrictions Weight Bearing Restrictions Per Provider Order: No     Mobility  Bed Mobility Overal bed mobility: Needs Assistance Bed Mobility: Supine to Sit, Sit to Supine     Supine to sit: Contact guard Sit to supine: Min assist   General bed mobility comments: cues and increased time with pt requiring min A for B LE to return to bed    Transfers Overall transfer level: Needs assistance     Sit to Stand: Total assist           General transfer  comment: Attempted to stand x 4-pt unable to process how to complete task. Despite MAX repeated cueing from therapist and visitior, pt unable. He kept scooting forward to EOB instead of standing up.    Ambulation/Gait                   Stairs             Wheelchair Mobility     Tilt Bed    Modified Rankin (Stroke Patients Only)       Balance Overall balance assessment: Needs assistance Sitting-balance support: Feet supported Sitting balance-Leahy Scale: Good                                      Communication    Cognition Arousal: Alert Behavior During Therapy: Flat affect   PT - Cognitive impairments: History of cognitive impairments                         Following commands: Impaired Following commands impaired: Follows one step commands inconsistently    Cueing Cueing Techniques: Verbal cues, Gestural cues, Tactile cues, Visual cues  Exercises      General Comments        Pertinent Vitals/Pain Pain Assessment Pain Assessment: Faces Pain Location: grimaces with movement Pain Descriptors / Indicators: Grimacing Pain Intervention(s): Monitored during session, Limited activity within patient's tolerance, Repositioned    Home Living  Prior Function            PT Goals (current goals can now be found in the care plan section) Progress towards PT goals: Not progressing toward goals - comment    Frequency    Min 2X/week      PT Plan      Co-evaluation              AM-PAC PT 6 Clicks Mobility   Outcome Measure  Help needed turning from your back to your side while in a flat bed without using bedrails?: A Little Help needed moving from lying on your back to sitting on the side of a flat bed without using bedrails?: A Little Help needed moving to and from a bed to a chair (including a wheelchair)?: Total Help needed standing up from a chair using your arms (e.g.,  wheelchair or bedside chair)?: Total Help needed to walk in hospital room?: Total Help needed climbing 3-5 steps with a railing? : Total 6 Click Score: 10    End of Session Equipment Utilized During Treatment: Gait belt Activity Tolerance: Patient tolerated treatment well Patient left: in bed;with call bell/phone within reach;with bed alarm set;with family/visitor present   PT Visit Diagnosis: Other abnormalities of gait and mobility (R26.89);Difficulty in walking, not elsewhere classified (R26.2)     Time: 8495-8480 PT Time Calculation (min) (ACUTE ONLY): 15 min  Charges:    $Therapeutic Activity: 8-22 mins PT General Charges $$ ACUTE PT VISIT: 1 Visit                        Dannial SQUIBB, PT Acute Rehabilitation  Office: [phone]

## 2024-05-08 DIAGNOSIS — I160 Hypertensive urgency: Secondary | ICD-10-CM | POA: Diagnosis not present

## 2024-05-08 DIAGNOSIS — F03918 Unspecified dementia, unspecified severity, with other behavioral disturbance: Secondary | ICD-10-CM | POA: Diagnosis not present

## 2024-05-08 DIAGNOSIS — R338 Other retention of urine: Secondary | ICD-10-CM | POA: Diagnosis not present

## 2024-05-08 LAB — VALPROIC ACID LEVEL: Valproic Acid Lvl: 20 ug/mL — ABNORMAL LOW (ref 50–100)

## 2024-05-08 MED ORDER — LORAZEPAM 2 MG/ML IJ SOLN
0.5000 mg | Freq: Once | INTRAMUSCULAR | Status: AC
  Administered 2024-05-08: 0.5 mg via INTRAVENOUS
  Filled 2024-05-08: qty 1

## 2024-05-08 NOTE — NC FL2 (Signed)
 [place]  MEDICAID FL2 LEVEL OF CARE FORM     IDENTIFICATION  Patient Name: Julian [name] Birthdate: 1925-12-08 Sex: male Admission Date (Current Location): 04/25/2024  [facility] and [place] Number:  Producer, television/film/video and Address:  [facility],  [address], [place] [address]      Provider Number: [id number]  Attending Physician Name and Address:  Patsy Lenis, MD  Relative Name and Phone Number:  Jedidiah, Demartini (Spouse)  [phone] [facility] Phone)    Current Level of Care: Hospital Recommended Level of Care: Memory Care Prior Approval Number:    Date Approved/Denied:   PASRR Number: [id number]  Discharge Plan: Other (Comment) (Memory Care)    Current Diagnoses: Patient Active Problem List   Diagnosis Date Noted   Hypertensive urgency 04/26/2024   Acute urinary retention 04/26/2024   Stercoral colitis 04/26/2024   Hypothyroidism 04/26/2024   Lung nodule 04/26/2024   Dementia with behavioral disturbance (HCC) 07/31/2023   Memory impairment 10/29/2022   RBBB 06/14/2018   Hyperlipidemia 01/26/2017   TIA (transient ischemic attack) 09/23/2016   Carotid artery disease (HCC) 10/27/2015   HTN (hypertension) 10/23/2014   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   Syncope 08/29/2014    Orientation RESPIRATION BLADDER Height & Weight     Self  Normal Incontinent Weight: 148 lb 13 oz (67.5 kg) Height:  5' 10 (177.8 cm)  BEHAVIORAL SYMPTOMS/MOOD NEUROLOGICAL BOWEL NUTRITION STATUS      Incontinent Diet (regular)  AMBULATORY STATUS COMMUNICATION OF NEEDS Skin   Supervision Verbally Other (Comment) (see d/c summary)                       Personal Care Assistance Level of Assistance  Bathing, Feeding, Dressing Bathing Assistance: Limited assistance Feeding assistance: Independent Dressing Assistance: Limited assistance     Functional Limitations Info  Sight, Hearing, Speech Sight Info: Adequate Hearing Info: Adequate Speech  Info: Adequate    SPECIAL CARE FACTORS FREQUENCY                       Contractures Contractures Info: Not present    Additional Factors Info  Code Status, Allergies Code Status Info: DNR Allergies Info: no known allergies Psychotropic Info: brexpiprazole  (REXULTI ) tablet 0.5 mg,citalopram  (CELEXA ) tablet 20 mg,divalproex  (DEPAKOTE  SPRINKLE) capsule 250 mg,LORazepam  (ATIVAN ) tablet 0.5 mg         Current Medications (05/08/2024):  This is the current hospital active medication list Current Facility-Administered Medications  Medication Dose Route Frequency Provider Last Rate Last Admin   acetaminophen  (TYLENOL ) tablet 650 mg  650 mg Oral Q6H PRN Opyd, Timothy S, MD   650 mg at 04/29/24 2011   Or   acetaminophen  (TYLENOL ) suppository 650 mg  650 mg Rectal Q6H PRN Opyd, Timothy S, MD       amLODipine  (NORVASC ) tablet 2.5 mg  2.5 mg Oral Daily Opyd, Timothy S, MD   2.5 mg at 05/08/24 9061   brexpiprazole  (REXULTI ) tablet 1 mg  1 mg Oral Daily Patsy Lenis, MD   1 mg at 05/08/24 9061   Chlorhexidine  Gluconate Cloth 2 % PADS 6 each  6 each Topical Daily Patsy Lenis, MD   6 each at 05/08/24 1112   citalopram  (CELEXA ) tablet 20 mg  20 mg Oral QHS Opyd, Timothy S, MD   20 mg at 05/07/24 2117   divalproex  (DEPAKOTE  SPRINKLE) capsule 250 mg  250 mg Oral Q12H Opyd, Timothy S, MD   250 mg at 05/08/24  9061   enoxaparin  (LOVENOX ) injection 40 mg  40 mg Subcutaneous Q24H Opyd, Timothy S, MD   40 mg at 05/08/24 9062   feeding supplement (ENSURE PLUS HIGH PROTEIN) liquid 237 mL  237 mL Oral BID BM Regalado, Belkys A, MD   237 mL at 05/08/24 1332   haloperidol  lactate (HALDOL ) injection 5 mg  5 mg Intravenous Q6H PRN Patsy Lenis, MD   5 mg at 05/07/24 2329   hydrALAZINE  (APRESOLINE ) tablet 25 mg  25 mg Oral Q6H PRN Opyd, Timothy S, MD   25 mg at 05/03/24 0204   labetalol  (NORMODYNE ) injection 10 mg  10 mg Intravenous Q2H PRN Opyd, Timothy S, MD   10 mg at 05/03/24 9692   levothyroxine   (SYNTHROID ) tablet 75 mcg  75 mcg Oral Q0600 Opyd, Timothy S, MD   75 mcg at 05/08/24 9076   LORazepam  (ATIVAN ) tablet 0.5 mg  0.5 mg Oral Q8H PRN Opyd, Timothy S, MD   0.5 mg at 05/07/24 2118   LORazepam  (ATIVAN ) tablet 0.5 mg  0.5 mg Oral Q1400 Opyd, Timothy S, MD   0.5 mg at 05/08/24 1332   losartan  (COZAAR ) tablet 100 mg  100 mg Oral Daily Opyd, Timothy S, MD   100 mg at 05/08/24 0938   polyethylene glycol (MIRALAX  / GLYCOLAX ) packet 17 g  17 g Oral Daily PRN Patsy Lenis, MD       prochlorperazine  (COMPAZINE ) injection 5 mg  5 mg Intravenous Q6H PRN Opyd, Timothy S, MD       senna-docusate (Senokot-S) tablet 1 tablet  1 tablet Oral BID Regalado, Belkys A, MD   1 tablet at 05/08/24 0938   sodium chloride  flush (NS) 0.9 % injection 3 mL  3 mL Intravenous Q12H Opyd, Timothy S, MD   3 mL at 05/08/24 9061   tamsulosin  (FLOMAX ) capsule 0.4 mg  0.4 mg Oral Daily Regalado, Belkys A, MD   0.4 mg at 05/08/24 9062     Discharge Medications: Please see discharge summary for a list of discharge medications.  Relevant Imaging Results:  Relevant Lab Results:   Additional Information SSN:[id number]  Tawni HERO Maurisa Tesmer, LCSW

## 2024-05-08 NOTE — Plan of Care (Signed)
?  Problem: Clinical Measurements: ?Goal: Will remain free from infection ?Outcome: Progressing ?Goal: Cardiovascular complication will be avoided ?Outcome: Progressing ?  ?Problem: Activity: ?Goal: Risk for activity intolerance will decrease ?Outcome: Progressing ?  ?Problem: Elimination: ?Goal: Will not experience complications related to bowel motility ?Outcome: Progressing ?Goal: Will not experience complications related to urinary retention ?Outcome: Progressing ?  ?

## 2024-05-08 NOTE — Progress Notes (Signed)
 Progress Note    Julian [name]   FMW:[id number]  DOB: 02-Mar-1926  DOA: 04/25/2024     9 PCP: Julian [name]., MD  Initial CC: Agitation  Hospital Course: [name] is a 89 yo male with PMH advanced Alzheimer's dementia with behavioral disturbance, HTN, HLD, hypothyroidism who presented with increased agitation. He was admitted from [facility]. He underwent further workup for contributing etiology of his agitation. CT angio chest/abdomen/pelvis was performed.  He was noted to have a dilated stool-filled rectum with rectal wall thickening concerning for fecal impaction and stercoral colitis. He also had significant urinary retention requiring Foley catheter placement in the ER with 1 L output immediately.  Catheter was continued during hospitalization.  Psychiatry was also consulted given his severe agitation.  He was transitioned from Seroquel  to Rexulti .  Remainder of psychotropic medications were continued.     Assessment & Plan:   Urinary retention - CT on admission noted stool-filled rectum with rectal wall thickening concerning for underlying stercoral colitis which may be contributing to urinary retention along with his behavioral changes and agitation - Catheter placed in the ER and has been in place for approximately 6 days - Urine culture negative; UA on admission was noted with moderate LE, negative nitrite, 21-50 WBC, rare bacteria - Continue Flomax  -TOV started on 05/01/2024.  Slowly retained urine overnight with large retention morning of 05/02/2024 - performed straight cath x 1 , 9/10.   - Serial bladder scans continued and failed again overnight on 05/03/2024 and Foley catheter replaced once again - Foley will remain at discharge and patient will follow-up outpatient with urology on 05/21/2024 for repeat TOV  Physical deconditioning - Patient has had significant physical decline during hospitalization especially now with prolonged hospitalization.  Wife reports that he  would ambulate independently at [facility] to meals and back.  Currently he is now a max/total assist and not safe for discharging back to memory care independently.   - He has physically improved some during hospitalization; theoretically now can discharge back to a memory care if physical strength remains stable; Long discussion held bedside with wife regarding discharge options.  This has been a daily conversation.  If declined from memory care units, no good backup options yet  Dementia with behavioral disturbance Acute metabolic encephalopathy -Evaluated by psychiatry as well, appreciate assistance.  Transitioned from Seroquel  to Rexulti  - Could continue up titration of Rexulti  outpatient depending on behavioral response -Continue Depakote , Ativan  -Continue with sitter -Has had some sundowning but appears to have at least improved and remaining stable for now - discussed with psychiatry on 05/08/2024 if any further options or adjustments needed  Stercoral colitis - No impaction noted on DRE per ED PA. - BM have improved - continue laxatives   Hypertensive urgency -resolved - Wife had reported on admission that some medications were recently discontinued notably Norvasc  due to low blood pressures outpatient - BP elevated on admission and responded well to reinitiation with amlodipine    Hypothyroidism - Continue with Synthroid    Lung nodules -Needs CT chest in 3 to 6 months  Interval History:  Some agitation overnight requiring more medications.  May have pulled Foley out which was replaced.  This morning asleep and resting comfortably.  Wife present bedside.  Children no longer want patient to return home with wife.  Now back to searching for memory care units.  Old records reviewed in assessment of this patient  Antimicrobials:   DVT prophylaxis:  enoxaparin  (LOVENOX ) injection 40  mg Start: 04/26/24 1000   Code Status:   Code Status: Limited: Do not attempt resuscitation  (DNR) -DNR-LIMITED -Do Not Intubate/DNI   Mobility Assessment (Last 72 Hours)     Mobility Assessment     Row Name 05/08/24 0900 05/07/24 2123 05/07/24 1523 05/07/24 0900 05/06/24 2304   Does the patient have exclusion criteria? No - Perform mobility assessment No - Perform mobility assessment -- No - Perform mobility assessment No - Perform mobility assessment   What is the highest level of mobility based on the mobility assessment? Level 4 (Ambulates with assistance) - Balance while stepping forward/back - Complete Level 3 (Stands with assistance) - Balance while standing  and cannot march in place Level 2 (Chairfast) - Balance while sitting on edge of bed and cannot stand Level 2 (Chairfast) - Balance while sitting on edge of bed and cannot stand Level 3 (Stands with assistance) - Balance while standing  and cannot march in place   Is the above level different from baseline mobility prior to current illness? No - Consider discontinuing PT/OT -- -- Yes - Recommend PT order Yes - Recommend PT order    Row Name 05/06/24 0715 05/05/24 2000         Does the patient have exclusion criteria? No - Perform mobility assessment No - Perform mobility assessment      What is the highest level of mobility based on the mobility assessment? Level 4 (Ambulates with assistance) - Balance while stepping forward/back - Complete Level 4 (Ambulates with assistance) - Balance while stepping forward/back - Complete      Is the above level different from baseline mobility prior to current illness? Yes - Recommend PT order Yes - Recommend PT order         Barriers to discharge: Multiple delays regarding where to go at discharge; wife has changed mind multiple times (memory care vs SNF vs home ) Disposition Plan: Searching for memory care units; plan has changed multiple times HH orders placed:  Status is: Inpt  Objective: Blood pressure 105/61, pulse 74, temperature 98.5 F (36.9 C), temperature source Oral,  resp. rate 16, height 5' 10 (1.778 m), weight 67.5 kg, SpO2 95%.  Examination:  Physical Exam Constitutional:      General: He is not in acute distress.    Comments: Sleeping and more sedated today  HENT:     Head: Normocephalic and atraumatic.     Mouth/Throat:     Mouth: Mucous membranes are moist.  Eyes:     Extraocular Movements: Extraocular movements intact.  Cardiovascular:     Rate and Rhythm: Normal rate and regular rhythm.  Pulmonary:     Effort: Pulmonary effort is normal. No respiratory distress.     Breath sounds: Normal breath sounds. No wheezing.  Abdominal:     General: Bowel sounds are normal. There is no distension.     Palpations: Abdomen is soft.     Tenderness: There is no abdominal tenderness.  Genitourinary:    Comments: Foley catheter in place with clear yellow urine in bag Musculoskeletal:        General: Normal range of motion.     Cervical back: Normal range of motion and neck supple.  Skin:    General: Skin is warm and dry.  Neurological:     Comments: Follows commands and moves all 4 extremities.  Underlying dementia appreciated      Consultants:  Psychiatry  Procedures:    Data Reviewed: No results found  for this or any previous visit (from the past 24 hours).  I have reviewed pertinent nursing notes, vitals, labs, and images as necessary. I have ordered labwork to follow up on as indicated.  I have reviewed the last notes from staff over past 24 hours. I have discussed patient's care plan and test results with nursing staff, CM/SW, and other staff as appropriate.  Time spent: Greater than 50% of the 55 minute visit was spent in counseling/coordination of care for the patient as laid out in the A&P.   LOS: 9 days   Alm Apo, MD [facility] 05/08/2024, 2:28 PM

## 2024-05-08 NOTE — Progress Notes (Signed)
 Pt very agitated and constantly trying to get OOB. Pt also been trying pull out his foley catheter and IV line. Pt placed on mittens.Pt sitter has left at 11pm and since then he has been restless. PRN ativan  and haldol  was given to pt with less to no effect. NP Blondie and JEARLDINE Guillaume made aware. Will continue to monitor pt.

## 2024-05-08 NOTE — TOC Progression Note (Signed)
 Transition of Care [facility]) - Progression Note    Patient Details  Name: Julian [name] MRN: [id number] Date of Birth: 1925-08-30  [facility] CM/SW Contact  [name], LCSW Phone Number: 05/08/2024, 12:11 PM  Clinical Narrative:     CSW spoke with the pt's spouse, who stated that she has contacted [facility] and was informed that there are no available beds and a waitlist is in place. The spouse inquired about [facility], and the CSW informed her that the facility has declined the pt.  CSW also contacted [facility], who reported no available beds in their memory care facility. CSW reached out to [facility] and spoke with Selinda, who confirmed there are available beds and will review the pt for possible placement. CSW attempted to contact [facility] but received no response and is currently awaiting a callback. IPCM to follow.    Expected Discharge Plan:  (TBD) Barriers to Discharge: Continued Medical Work up               Expected Discharge Plan and Services                                               Social Drivers of Health (SDOH) Interventions SDOH Screenings   Food Insecurity: Patient Unable To Answer (04/26/2024)  Housing: Unknown (04/26/2024)  Transportation Needs: Patient Unable To Answer (04/26/2024)  Utilities: Patient Unable To Answer (04/26/2024)  Social Connections: Patient Unable To Answer (04/26/2024)  Tobacco Use: Medium Risk (04/26/2024)    Readmission Risk Interventions     No data to display

## 2024-05-09 DIAGNOSIS — I160 Hypertensive urgency: Secondary | ICD-10-CM | POA: Diagnosis not present

## 2024-05-09 NOTE — Progress Notes (Signed)
 OT Cancellation Note  Patient Details Name: Julian [name] MRN: [id number] DOB: [birth year]   Cancelled Treatment:    Reason Eval/Treat Not Completed: Fatigue/lethargy limiting ability to participate. Pt was sleeping soundly and was unable to be aroused to voice, sternal rub, and tactile stimuli to feet. Will follow-up another day.   [name], OTR/L 05/09/2024, 4:19 PM

## 2024-05-09 NOTE — Plan of Care (Signed)
   Problem: Clinical Measurements: Goal: Will remain free from infection Outcome: Progressing Goal: Diagnostic test results will improve Outcome: Progressing Goal: Respiratory complications will improve Outcome: Progressing Goal: Cardiovascular complication will be avoided Outcome: Progressing

## 2024-05-09 NOTE — Progress Notes (Signed)
 Physical Therapy Treatment Patient Details Name: Julian [name] MRN: [id number] DOB: 08-Feb-1926 Today's Date: 05/09/2024   History of Present Illness 89 year old with past history significant for Alzheimer dementia, behavioral disturbance, hypertension, hyperlipidemia, hypothyroid who presents with increased agitation.    PT Comments  Pt initially declined mobility when asked. With time and encouragement, able to get him to participate. Max A for bed mobility, Mod A +2 for standing and taking a few shuffle steps forwards then backwards at bedside. Could not encourage him to ambulate in hallway. No family present during session. Patient will benefit from continued inpatient follow up therapy, <3 hours/day. TOC working with family on d/c plan.    If plan is discharge home, recommend the following: Two people to help with walking and/or transfers;Two people to help with bathing/dressing/bathroom;Assistance with cooking/housework;Direct supervision/assist for financial management;Help with stairs or ramp for entrance;Assist for transportation;Assistance with feeding   Can travel by private vehicle        Equipment Recommendations  None recommended by PT    Recommendations for Other Services       Precautions / Restrictions Precautions Precautions: Fall Recall of Precautions/Restrictions: Impaired Restrictions Weight Bearing Restrictions Per Provider Order: Yes     Mobility  Bed Mobility Overal bed mobility: Needs Assistance Bed Mobility: Supine to Sit     Supine to sit: Max assist, HOB elevated     General bed mobility comments: Assist for trunk and bil LEs. Max multiodal cuing required. Increased time.    Transfers Overall transfer level: Needs assistance Equipment used: Rolling walker (2 wheels) Transfers: Sit to/from Stand Sit to Stand: Mod assist, +2 physical assistance, +2 safety/equipment           General transfer comment: Stood x 1 from elevated bed with use of  RW. Max multimodal cueing required. Increased time.    Ambulation/Gait Ambulation/Gait assistance: Mod assist, +2 physical assistance, +2 safety/equipment Gait Distance (Feet): 2 Feet Assistive device: Rolling walker (2 wheels) Gait Pattern/deviations: Shuffle       General Gait Details: Pt declined ambulation when asked. Able to get him to take a few short shuffle steps forwards then backwards. Could not encourage him to take a walk. Assist to stabilize pt and manage RW.   Stairs             Wheelchair Mobility     Tilt Bed    Modified Rankin (Stroke Patients Only)       Balance Overall balance assessment: Needs assistance         Standing balance support: Bilateral upper extremity supported, During functional activity, Reliant on assistive device for balance Standing balance-Leahy Scale: Poor                              Communication Communication Communication: Impaired Factors Affecting Communication: Hearing impaired  Cognition Arousal: Alert (a bit lethargic until sat EOB, then much more alert) Behavior During Therapy: Restless, Flat affect   PT - Cognitive impairments: History of cognitive impairments                         Following commands: Impaired Following commands impaired: Follows one step commands inconsistently    Cueing Cueing Techniques: Verbal cues, Gestural cues, Tactile cues, Visual cues  Exercises      General Comments        Pertinent Vitals/Pain Pain Assessment Pain Assessment: Faces Faces Pain Scale: No hurt  Home Living                          Prior Function            PT Goals (current goals can now be found in the care plan section) Progress towards PT goals: Not progressing toward goals - comment    Frequency    Min 2X/week      PT Plan      Co-evaluation              AM-PAC PT 6 Clicks Mobility   Outcome Measure  Help needed turning from your back to  your side while in a flat bed without using bedrails?: A Lot Help needed moving from lying on your back to sitting on the side of a flat bed without using bedrails?: A Lot Help needed moving to and from a bed to a chair (including a wheelchair)?: A Lot Help needed standing up from a chair using your arms (e.g., wheelchair or bedside chair)?: A Lot Help needed to walk in hospital room?: Total Help needed climbing 3-5 steps with a railing? : Total 6 Click Score: 10    End of Session Equipment Utilized During Treatment: Gait belt Activity Tolerance: Patient tolerated treatment well Patient left: in bed;with call bell/phone within reach;with bed alarm set;with family/visitor present   PT Visit Diagnosis: Other abnormalities of gait and mobility (R26.89);Difficulty in walking, not elsewhere classified (R26.2)     Time: 8485-8468 PT Time Calculation (min) (ACUTE ONLY): 17 min  Charges:    $Gait Training: 8-22 mins PT General Charges $$ ACUTE PT VISIT: 1 Visit                        Dannial SQUIBB, PT Acute Rehabilitation  Office: [phone]

## 2024-05-09 NOTE — Progress Notes (Signed)
 Progress Note   Patient: Julian [name] FMW:[id number] DOB: 1926-06-28 DOA: 04/25/2024     10 DOS: the patient was seen and examined on 05/09/2024    Hospital Course: Julian [name] is a 89 yo male with PMH advanced Alzheimer's dementia with behavioral disturbance, HTN, HLD, hypothyroidism who presented with increased agitation. He was admitted from [facility]. He underwent further workup for contributing etiology of his agitation. CT angio chest/abdomen/pelvis was performed.  He was noted to have a dilated stool-filled rectum with rectal wall thickening concerning for fecal impaction and stercoral colitis. He also had significant urinary retention requiring Foley catheter placement in the ER with 1 L output immediately.  Catheter was continued during hospitalization.   Psychiatry was also consulted given his severe agitation.  He was transitioned from Seroquel  to Rexulti .  Remainder of psychotropic medications were continued.     Assessment & Plan:   Urinary retention - CT on admission noted stool-filled rectum with rectal wall thickening concerning for underlying stercoral colitis which may be contributing to urinary retention along with his behavioral changes and agitation Continue Foley catheter - Continue Flomax  - Foley will remain at discharge and patient will follow-up outpatient with urology on 05/21/2024 for repeat TOV   Physical deconditioning - Patient has had significant physical decline during hospitalization especially now with prolonged hospitalization.  Wife reports that he would ambulate independently at [facility] to meals and back.  Currently he is now a max/total assist and not safe for discharging back to memory care independently.      Dementia with behavioral disturbance Acute metabolic encephalopathy -Evaluated by psychiatry as well, appreciate assistance.  Transitioned from Seroquel  to Rexulti  - Could continue up titration of Rexulti  outpatient depending on  behavioral response -Continue Depakote , Ativan  -Continue with sitter -Has had some sundowning but appears to have at least improved and remaining stable for now Plan of care discussed with psychiatrist today    Stercoral colitis - No impaction noted on DRE per ED PA. - BM have improved - continue laxatives    Hypertensive urgency -resolved - Continue current antihypertensives including amlodipine  and hydralazine    Hypothyroidism - Continue with Synthroid    Lung nodules -Needs CT chest in 3 to 6 months   DVT prophylaxis:  enoxaparin  (LOVENOX ) injection 40 mg Start: 04/26/24 1000     Code Status:   Code Status: Limited: Do not attempt resuscitation (DNR) -DNR-LIMITED -Do Not Intubate/DNI     Subjective:  Patient seen and examined at bedside this morning in the presence of the family Denies nausea vomiting abdominal pain chest pain  Physical Exam:  Constitutional:      General: He is not in acute distress.    Comments: Sleeping and more sedated today  HENT:     Head: Normocephalic and atraumatic.     Mouth/Throat:     Mouth: Mucous membranes are moist.  Eyes:     Extraocular Movements: Extraocular movements intact.  Cardiovascular:     Rate and Rhythm: Normal rate and regular rhythm.  Pulmonary:     Effort: Pulmonary effort is normal. No respiratory distress.     Breath sounds: Normal breath sounds. No wheezing.  Abdominal:     General: Bowel sounds are normal. There is no distension.     Palpations: Abdomen is soft.     Tenderness: There is no abdominal tenderness.  Genitourinary:    Comments: Foley catheter in place with clear yellow urine in bag Musculoskeletal:        General:  Normal range of motion.     Cervical back: Normal range of motion and neck supple.  Skin:    General: Skin is warm and dry.  Neurological:     Comments: Follows commands and moves all 4 extremities.  Underlying dementia appreciated      Vitals:   05/08/24 1400 05/08/24 2208  05/09/24 0735 05/09/24 1425  BP: 105/61 (!) 150/76 (!) 158/81 (!) 124/102  Pulse: 74 80 (!) 59 72  Resp: 16 16 16 20   Temp: 98.5 F (36.9 C) 98.4 F (36.9 C) 98 F (36.7 C)   TempSrc: Oral Axillary Oral   SpO2: 95%  96% 97%  Weight:      Height:        Data Reviewed:     Latest Ref Rng & Units 04/30/2024    3:55 AM 04/27/2024    3:38 AM 04/26/2024    4:07 AM  CBC  WBC 4.0 - 10.5 K/uL 5.9  10.3  8.4   Hemoglobin 13.0 - 17.0 g/dL 88.9  87.3  87.2   Hematocrit 39.0 - 52.0 % 34.7  42.1  42.3   Platelets 150 - 400 K/uL 203  235  248        Latest Ref Rng & Units 04/30/2024    3:55 AM 04/27/2024    3:38 AM 04/26/2024    4:07 AM  BMP  Glucose 70 - 99 mg/dL 86  88  896   BUN 8 - 23 mg/dL 23  26  16    Creatinine 0.61 - 1.24 mg/dL 9.07  8.80  9.20   Sodium 135 - 145 mmol/L 139  137  139   Potassium 3.5 - 5.1 mmol/L 4.3  4.5  3.9   Chloride 98 - 111 mmol/L 105  100  100   CO2 22 - 32 mmol/L 25  26  28    Calcium  8.9 - 10.3 mg/dL 8.6  9.6  9.6     Author: Drue ONEIDA Potter, MD 05/09/2024 3:21 PM  For on call review [email or web address].

## 2024-05-10 DIAGNOSIS — I160 Hypertensive urgency: Secondary | ICD-10-CM | POA: Diagnosis not present

## 2024-05-10 LAB — CBC WITH DIFFERENTIAL/PLATELET
Abs Immature Granulocytes: 0.03 K/uL (ref 0.00–0.07)
Basophils Absolute: 0.1 K/uL (ref 0.0–0.1)
Basophils Relative: 1 %
Eosinophils Absolute: 0.3 K/uL (ref 0.0–0.5)
Eosinophils Relative: 5 %
HCT: 37.9 % — ABNORMAL LOW (ref 39.0–52.0)
Hemoglobin: 11.9 g/dL — ABNORMAL LOW (ref 13.0–17.0)
Immature Granulocytes: 1 %
Lymphocytes Relative: 17 %
Lymphs Abs: 1.1 K/uL (ref 0.7–4.0)
MCH: 27.9 pg (ref 26.0–34.0)
MCHC: 31.4 g/dL (ref 30.0–36.0)
MCV: 89 fL (ref 80.0–100.0)
Monocytes Absolute: 0.5 K/uL (ref 0.1–1.0)
Monocytes Relative: 9 %
Neutro Abs: 4.3 K/uL (ref 1.7–7.7)
Neutrophils Relative %: 67 %
Platelets: 247 K/uL (ref 150–400)
RBC: 4.26 MIL/uL (ref 4.22–5.81)
RDW: 14.3 % (ref 11.5–15.5)
WBC: 6.3 K/uL (ref 4.0–10.5)
nRBC: 0 % (ref 0.0–0.2)

## 2024-05-10 LAB — BASIC METABOLIC PANEL WITH GFR
Anion gap: 11 (ref 5–15)
BUN: 25 mg/dL — ABNORMAL HIGH (ref 8–23)
CO2: 25 mmol/L (ref 22–32)
Calcium: 9.2 mg/dL (ref 8.9–10.3)
Chloride: 102 mmol/L (ref 98–111)
Creatinine, Ser: 0.9 mg/dL (ref 0.61–1.24)
GFR, Estimated: >60 mL/min (ref >60)
Glucose, Bld: 84 mg/dL (ref 70–99)
Potassium: 4.5 mmol/L (ref 3.5–5.1)
Sodium: 138 mmol/L (ref 135–145)

## 2024-05-10 NOTE — Progress Notes (Signed)
 Progress Note   Patient: Julian [name] FMW:[id number] DOB: [birth year] DOA: 04/25/2024     11 DOS: the patient was seen and examined on 05/10/2024    Hospital Course: Julian [name] is a 89 yo male with PMH advanced Alzheimer's dementia with behavioral disturbance, HTN, HLD, hypothyroidism who presented with increased agitation. He was admitted from [facility]. He underwent further workup for contributing etiology of his agitation. CT angio chest/abdomen/pelvis was performed.  He was noted to have a dilated stool-filled rectum with rectal wall thickening concerning for fecal impaction and stercoral colitis. He also had significant urinary retention requiring Foley catheter placement in the ER with 1 L output immediately.  Catheter was continued during hospitalization.   Psychiatry was also consulted given his severe agitation.  He was transitioned from Seroquel  to Rexulti .  Remainder of psychotropic medications were continued.     Assessment & Plan:   Urinary retention - CT on admission noted stool-filled rectum with rectal wall thickening concerning for underlying stercoral colitis which may be contributing to urinary retention along with his behavioral changes and agitation Continue Foley catheter - Continue Flomax  - Foley will remain at discharge and patient will follow-up outpatient with urology on 05/21/2024 for repeat TOV   Physical deconditioning - Patient has had significant physical decline during hospitalization especially now with prolonged hospitalization.  Wife reports that he would ambulate independently at [facility] to meals and back.  Currently he is now a max/total assist and not safe for discharging back to memory care independently.       Dementia with behavioral disturbance Acute metabolic encephalopathy -Evaluated by psychiatry as well, appreciate assistance.  Transitioned from Seroquel  to Rexulti  - Could continue up titration of Rexulti  outpatient depending on  behavioral response -Continue Depakote , Ativan  Continue sitter -Has had some sundowning but appears to have at least improved and remaining stable for now Plan of care discussed with psychiatrist today     Stercoral colitis - No impaction noted on DRE per ED PA. - BM have improved - continue laxatives    Hypertensive urgency -resolved - Continue current antihypertensives including amlodipine  and hydralazine    Hypothyroidism - Continue with Synthroid    Lung nodules -Needs CT chest in 3 to 6 months     DVT prophylaxis:  enoxaparin  (LOVENOX ) injection 40 mg Start: 04/26/24 1000     Code Status:   Code Status: Limited: Do not attempt resuscitation (DNR) -DNR-LIMITED -Do Not Intubate/DNI    Subjective:  Patient seen and examined at bedside this morning in the presence of the family Unable to provide subjective information given altered mental status   Physical Exam:   Constitutional:      General: He is not in acute distress. Appears sleepy Cardiovascular:     Rate and Rhythm: Normal rate and regular rhythm.  Pulmonary:     Effort: Pulmonary effort is normal. No respiratory distress.     Breath sounds: Normal breath sounds. No wheezing.  Abdominal:     General: Bowel sounds are normal. There is no distension.     Palpations: Abdomen is soft.     Tenderness: There is no abdominal tenderness.  Musculoskeletal:        General: Normal range of motion.     Cervical back: Normal range of motion and neck supple.  Skin:    General: Skin is warm and dry.  Neurological:     Comments: Follows commands and moves all 4 extremities.  Underlying dementia appreciated      Vitals:  05/09/24 2100 05/10/24 0638 05/10/24 0655 05/10/24 0725  BP: (!) 154/74 (!) 178/77 (!) 184/78 (!) 146/59  Pulse: 76 70  67  Resp: 16 16  15   Temp: 97.6 F (36.4 C) 97.8 F (36.6 C)  97.8 F (36.6 C)  TempSrc: Oral Oral  Axillary  SpO2: 100% 100%  97%  Weight:      Height:          Latest  Ref Rng & Units 05/10/2024    6:09 AM 04/30/2024    3:55 AM 04/27/2024    3:38 AM  CBC  WBC 4.0 - 10.5 K/uL 6.3  5.9  10.3   Hemoglobin 13.0 - 17.0 g/dL 88.0  88.9  87.3   Hematocrit 39.0 - 52.0 % 37.9  34.7  42.1   Platelets 150 - 400 K/uL 247  203  235        Latest Ref Rng & Units 05/10/2024    6:09 AM 04/30/2024    3:55 AM 04/27/2024    3:38 AM  BMP  Glucose 70 - 99 mg/dL 84  86  88   BUN 8 - 23 mg/dL 25  23  26    Creatinine 0.61 - 1.24 mg/dL 9.09  9.07  8.80   Sodium 135 - 145 mmol/L 138  139  137   Potassium 3.5 - 5.1 mmol/L 4.5  4.3  4.5   Chloride 98 - 111 mmol/L 102  105  100   CO2 22 - 32 mmol/L 25  25  26    Calcium  8.9 - 10.3 mg/dL 9.2  8.6  9.6      Author: Drue ONEIDA Potter, MD 05/10/2024 1:20 PM  For on call review [email or web address].

## 2024-05-10 NOTE — TOC Progression Note (Signed)
 Transition of Care [facility]) - Progression Note    Patient Details  Name: Julian [name] MRN: [id number] Date of Birth: February 03, 1926  Transition of [facility] CM/SW Contact  [name], LCSW Phone Number: 05/10/2024, 11:48 AM  Clinical Narrative:     CSW attempted to follow up with [facility] and left a voicemail for Selinda to return the call.  CSW spoke with the pt's wife, who reported that Fredick is requesting too much money upfront. Pt's spouse also mentioned that she has asked a friend for assistance.  CSW contacted Senda at [facility] to inquire if the pt could return. She stated that she would need to consult with the facility director and follow up.  2:00 pm, CSW spoke with [facility], who reported that the facility director, Ema Hammersmith, would need to make the decision regarding the pt's readmission; however, Jennea will not be back in the office until Monday. Care management to follow.               Expected Discharge Plan and Services                                               Social Drivers of Health (SDOH) Interventions SDOH Screenings   Food Insecurity: Patient Unable To Answer (04/26/2024)  Housing: Unknown (04/26/2024)  Transportation Needs: Patient Unable To Answer (04/26/2024)  Utilities: Patient Unable To Answer (04/26/2024)  Social Connections: Patient Unable To Answer (04/26/2024)  Tobacco Use: Medium Risk (04/26/2024)    Readmission Risk Interventions     No data to display

## 2024-05-10 NOTE — Plan of Care (Signed)
  Problem: Clinical Measurements: Goal: Will remain free from infection Outcome: Progressing Goal: Diagnostic test results will improve Outcome: Progressing Goal: Respiratory complications will improve Outcome: Progressing Goal: Cardiovascular complication will be avoided Outcome: Progressing   Problem: Activity: Goal: Risk for activity intolerance will decrease Outcome: Progressing   Problem: Nutrition: Goal: Adequate nutrition will be maintained Outcome: Progressing   Problem: Elimination: Goal: Will not experience complications related to bowel motility Outcome: Progressing Goal: Will not experience complications related to urinary retention Outcome: Progressing   Problem: Pain Managment: Goal: General experience of comfort will improve and/or be controlled Outcome: Progressing   Problem: Safety: Goal: Ability to remain free from injury will improve Outcome: Progressing   Problem: Skin Integrity: Goal: Risk for impaired skin integrity will decrease Outcome: Progressing   Problem: Safety: Goal: Non-violent Restraint(s) Outcome: Progressing

## 2024-05-10 NOTE — Progress Notes (Signed)
 Mobility Specialist - Progress Note    05/10/24 1317  Mobility  Activity Ambulated with assistance  Level of Assistance +2 (takes two people)  Assistive Device Other (Comment) (HHA)  Distance Ambulated (ft) 350 ft  Range of Motion/Exercises Active Assistive  Activity Response Tolerated well  Mobility Referral Yes  Mobility visit 1 Mobility  Mobility Specialist Start Time (ACUTE ONLY) 1300  Mobility Specialist Stop Time (ACUTE ONLY) 1317  Mobility Specialist Time Calculation (min) (ACUTE ONLY) 17 min   Pt was received in bed and agreed to mobility. Mod A from bed and STS. HHA on both sides; Pt politely declined RW. Pt returned to bed with all needs met. Call bell in reach.  [employer] - Mobility Specialist

## 2024-05-11 DIAGNOSIS — I160 Hypertensive urgency: Secondary | ICD-10-CM | POA: Diagnosis not present

## 2024-05-11 LAB — CBC WITH DIFFERENTIAL/PLATELET
Abs Immature Granulocytes: 0.05 K/uL (ref 0.00–0.07)
Basophils Absolute: 0 K/uL (ref 0.0–0.1)
Basophils Relative: 1 %
Eosinophils Absolute: 0.3 K/uL (ref 0.0–0.5)
Eosinophils Relative: 4 %
HCT: 39.6 % (ref 39.0–52.0)
Hemoglobin: 12.2 g/dL — ABNORMAL LOW (ref 13.0–17.0)
Immature Granulocytes: 1 %
Lymphocytes Relative: 19 %
Lymphs Abs: 1.4 K/uL (ref 0.7–4.0)
MCH: 27.5 pg (ref 26.0–34.0)
MCHC: 30.8 g/dL (ref 30.0–36.0)
MCV: 89.2 fL (ref 80.0–100.0)
Monocytes Absolute: 0.7 K/uL (ref 0.1–1.0)
Monocytes Relative: 9 %
Neutro Abs: 4.9 K/uL (ref 1.7–7.7)
Neutrophils Relative %: 66 %
Platelets: 283 K/uL (ref 150–400)
RBC: 4.44 MIL/uL (ref 4.22–5.81)
RDW: 14.2 % (ref 11.5–15.5)
WBC: 7.4 K/uL (ref 4.0–10.5)
nRBC: 0 % (ref 0.0–0.2)

## 2024-05-11 LAB — BASIC METABOLIC PANEL WITH GFR
Anion gap: 12 (ref 5–15)
BUN: 24 mg/dL — ABNORMAL HIGH (ref 8–23)
CO2: 25 mmol/L (ref 22–32)
Calcium: 9.2 mg/dL (ref 8.9–10.3)
Chloride: 102 mmol/L (ref 98–111)
Creatinine, Ser: 0.74 mg/dL (ref 0.61–1.24)
GFR, Estimated: >60 mL/min (ref >60)
Glucose, Bld: 87 mg/dL (ref 70–99)
Potassium: 4.5 mmol/L (ref 3.5–5.1)
Sodium: 139 mmol/L (ref 135–145)

## 2024-05-11 NOTE — Progress Notes (Signed)
 Occupational Therapy Treatment Patient Details Name: Julian [name] MRN: [id number] DOB: August 31, 1925 Today's Date: 05/11/2024   History of present illness 89 year old with past history significant for Alzheimer dementia, behavioral disturbance, hypertension, hyperlipidemia, hypothyroid who presents with increased agitation.   OT comments  The pt continues to make gradual functional progress. He presented with with good participation in the session, and he performed feeding, functional ambulation with HHA, and toileting at bathroom level. He was calm and cooperative. OT anticipates he could return to his memory care facility at discharge. Continue OT plan of care.       If plan is discharge home, recommend the following:  A little help with walking and/or transfers;Direct supervision/assist for medications management;Supervision due to cognitive status;Direct supervision/assist for financial management;A little help with bathing/dressing/bathroom   Equipment Recommendations  None recommended by OT    Recommendations for Other Services      Precautions / Restrictions Precautions Precautions: Fall Restrictions Weight Bearing Restrictions Per Provider Order: No       Mobility Bed Mobility Overal bed mobility: Needs Assistance Bed Mobility: Supine to Sit     Supine to sit: Supervision Sit to supine: Supervision        Transfers Overall transfer level: Needs assistance Equipment used: None Transfers: Sit to/from Stand Sit to Stand: Contact guard assist                 Balance Overall balance assessment: Needs assistance   Sitting balance-Leahy Scale: Good       Standing balance-Leahy Scale: Fair           ADL either performed or assessed with clinical judgement   ADL Overall ADL's : Needs assistance/impaired Eating/Feeding: Set up;Supervision/ safety;Bed level Eating/Feeding Details (indicate cue type and reason): The pt was observed self-feeding in bed,  upon this OT entering his room.                     Toilet Transfer: Cueing for sequencing;Cueing for safety;Grab bars;Ambulation;Contact guard assist Toilet Transfer Details (indicate cue type and reason): He ambulated to and from the bathroom in his room with HHA. He required min-mod cues for grab bar use and to step back closer to the toilet, prior to attempting to sit. Toileting- Clothing Manipulation and Hygiene: Cueing for sequencing;Cueing for safety;Moderate assistance;Sit to/from stand Toileting - Clothing Manipulation Details (indicate cue type and reason): The pt required min assist for clothing management and assist to perform posterior peri-hygiene after a bowel movement.              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[employer], Conservation officer, historic buildings Attention impairment (select first level of impairment): Divided attention, Sustained attention Executive functioning impairment (select all impairments): Organization, Problem solving                     Following commands impaired: Follows one step commands with increased time (and occasional repetition of prompts)      Cueing   Cueing Techniques: Verbal cues, Gestural cues, Tactile cues, Visual cues  Exercises              Pertinent Vitals/ Pain       Pain Assessment Pain Assessment: No/denies pain   Frequency  Min 2X/week  Progress Toward Goals  OT Goals(current goals can now be found in the care plan section)  Progress towards OT goals: Progressing toward goals  Acute Rehab OT Goals OT Goal Formulation: Patient unable to participate in goal setting Time For Goal  Achievement: 05/25/24 Potential to Achieve Goals: Good  Plan         AM-PAC OT 6 Clicks Daily Activity     Outcome Measure   Help from another person eating meals?: A Little Help from another person taking care of personal grooming?: A Little Help from another person toileting, which includes using toliet, bedpan, or urinal?: A Little Help from another person bathing (including washing, rinsing, drying)?: A Little Help from another person to put on and taking off regular upper body clothing?: A Little Help from another person to put on and taking off regular lower body clothing?: A Little 6 Click Score: 18    End of Session Equipment Utilized During Treatment: Other (comment) (N/A)  OT Visit Diagnosis: Other symptoms and signs involving cognitive function;Other abnormalities of gait and mobility (R26.89)   Activity Tolerance Patient tolerated treatment well   Patient Left in bed;with call bell/phone within reach;with bed alarm set   Nurse Communication Mobility status        Time: 8784-8766 OT Time Calculation (min): 18 min  Charges: OT General Charges $OT Visit: 1 Visit OT Treatments $Self Care/Home Management : 8-22 mins     [name], OTR/L 05/11/2024, 3:34 PM

## 2024-05-11 NOTE — Plan of Care (Signed)
  Problem: Clinical Measurements: Goal: Diagnostic test results will improve Outcome: Progressing Goal: Respiratory complications will improve Outcome: Progressing Goal: Cardiovascular complication will be avoided Outcome: Progressing   Problem: Elimination: Goal: Will not experience complications related to bowel motility Outcome: Progressing Goal: Will not experience complications related to urinary retention Outcome: Progressing   

## 2024-05-11 NOTE — Progress Notes (Addendum)
 Physical Therapy Treatment Patient Details Name: Julian [name] MRN: [id number] DOB: 1925/10/01 Today's Date: 05/11/2024  PT reviewed note and recommendation to d/c to ALF memory care at [facility] is appropriate, pt has demonstrated good progress with functional mobility tasks while in hospital and SNF is no longer warranted.   [place], PT Acute Rehab     History of Present Illness 89 year old with past history significant for Alzheimer dementia, behavioral disturbance, hypertension, hyperlipidemia, hypothyroid who presents with increased agitation.    PT Comments  Much improved mobility/cognition.  Pt appears at prior level.  PT - Cognition Comments: AxO x 1 awake eating breakfast with Spouse at bedside.  Pleasant.  Following all functional commands. Good engagement.  Able to tell me he was in the [employer] for four years and worked in [employer]  Pt was required NO physical Assistance to get OOB and amb full unit >500 feet.  He presents with good dynamic balance.  Functional gait speed for his age.   Spouse would like Pt to return to [facility].  Will update LPT     If plan is discharge home, recommend the following:     Can travel by private vehicle        Equipment Recommendations  None recommended by PT    Recommendations for Other Services       Precautions / Restrictions Precautions Precautions: Fall Precaution/Restrictions Comments: Alheimers/Dementia Restrictions Weight Bearing Restrictions Per Provider Order: No     Mobility  Bed Mobility Overal bed mobility: Modified Independent             General bed mobility comments: self able to transfer OOB with 25% functional commands.  Good use of B UE's to self assist.  When asked how do you feel? Pt responded with good.    Transfers Overall transfer level: Needs assistance Equipment used: None Transfers: Sit to/from Stand Sit to Stand: Supervision           General transfer comment:  Pt was able to self rise with NO assist.  NO LOB.  Good self midline posture and NO use of B UE's to support self.  Good static and dynamic balance.    Ambulation/Gait Ambulation/Gait assistance: Supervision Gait Distance (Feet): 500 Feet Assistive device: None Gait Pattern/deviations: Step-through pattern, Decreased stride length Gait velocity: WNL     General Gait Details: Much improved gait requiring NO AD and NO physical assistance.  Pt amb full unit >500 feet at Supervision level.  He presents with good dynamic balance.  Functional gait speed for his age.  Appears at prior level.   Stairs             Wheelchair Mobility     Tilt Bed    Modified Rankin (Stroke Patients Only)       Balance                                            Communication    Cognition   Behavior During Therapy: WFL for tasks assessed/performed   PT - Cognitive impairments: History of cognitive impairments                       PT - Cognition Comments: AxO x 1 awake eating breakfast with Spouse at bedside.  Pleasant.  Following all functional commands. Good engagement.  Able to tell me  he was in the [employer] for four years and worked in [employer] Following commands: Intact Following commands impaired: Only follows one step commands consistently    Cueing    Exercises      General Comments        Pertinent Vitals/Pain Pain Assessment Pain Assessment: No/denies pain    Home Living                          Prior Function            PT Goals (current goals can now be found in the care plan section) Progress towards PT goals: Progressing toward goals    Frequency    Min 2X/week      PT Plan      Co-evaluation              AM-PAC PT 6 Clicks Mobility   Outcome Measure  Help needed turning from your back to your side while in a flat bed without using bedrails?: None Help needed moving from lying on your back to sitting  on the side of a flat bed without using bedrails?: None Help needed moving to and from a bed to a chair (including a wheelchair)?: None Help needed standing up from a chair using your arms (e.g., wheelchair or bedside chair)?: None Help needed to walk in hospital room?: A Little Help needed climbing 3-5 steps with a railing? : A Little 6 Click Score: 22    End of Session Equipment Utilized During Treatment: Gait belt Activity Tolerance: Patient tolerated treatment well Patient left: in chair;with call bell/phone within reach;with chair alarm set;with family/visitor present Nurse Communication: Mobility status PT Visit Diagnosis: Other abnormalities of gait and mobility (R26.89);Difficulty in walking, not elsewhere classified (R26.2)     Time: 9099-9075 PT Time Calculation (min) (ACUTE ONLY): 24 min  Charges:    $Gait Training: 8-22 mins $Therapeutic Activity: 8-22 mins PT General Charges $$ ACUTE PT VISIT: 1 Visit                     Katheryn Leap  PTA Acute  Rehabilitation Services Office M-F          [phone]

## 2024-05-11 NOTE — Plan of Care (Signed)
  Problem: Clinical Measurements: Goal: Respiratory complications will improve Outcome: Progressing   Problem: Activity: Goal: Risk for activity intolerance will decrease Outcome: Progressing   Problem: Elimination: Goal: Will not experience complications related to bowel motility Outcome: Progressing   Problem: Elimination: Goal: Will not experience complications related to urinary retention Outcome: Progressing   Problem: Safety: Goal: Ability to remain free from injury will improve Outcome: Progressing

## 2024-05-11 NOTE — Progress Notes (Signed)
 Progress Note   Patient: Julian [name] FMW:[id number] DOB: 11-05-1925 DOA: 04/25/2024     12 DOS: the patient was seen and examined on 05/11/2024  Hospital Course: Julian [name] is a 89 yo male with PMH advanced Alzheimer's dementia with behavioral disturbance, HTN, HLD, hypothyroidism who presented with increased agitation. He was admitted from [facility]. He underwent further workup for contributing etiology of his agitation. CT angio chest/abdomen/pelvis was performed.  He was noted to have a dilated stool-filled rectum with rectal wall thickening concerning for fecal impaction and stercoral colitis. He also had significant urinary retention requiring Foley catheter placement in the ER with 1 L output immediately.  Catheter was continued during hospitalization.   Psychiatry was also consulted given his severe agitation.  He was transitioned from Seroquel  to Rexulti .  Remainder of psychotropic medications were continued.     Assessment & Plan:   Urinary retention - CT on admission noted stool-filled rectum with rectal wall thickening concerning for underlying stercoral colitis which may be contributing to urinary retention along with his behavioral changes and agitation Continue Foley catheter - Continue Flomax  - Foley will remain at discharge and patient will follow-up outpatient with urology on 05/21/2024 for repeat TOV   Physical deconditioning - Patient has had significant physical decline during hospitalization especially now with prolonged hospitalization.  Wife reports that he would ambulate independently at [facility] to meals and back.  Currently he is now a max/total assist and not safe for discharging back to memory care independently.       Dementia with behavioral disturbance Acute metabolic encephalopathy -Evaluated by psychiatry as well, appreciate assistance.  Transitioned from Seroquel  to Rexulti  - Could continue up titration of Rexulti  outpatient depending on behavioral  response -Continue Depakote , Ativan  Continue sitter -Has had some sundowning but appears to have at least improved and remaining stable for now Plan of care discussed with psychiatrist today     Stercoral colitis - No impaction noted on DRE per ED PA. - BM have improved - continue laxatives    Hypertensive urgency -resolved - Continue current antihypertensives including amlodipine  and hydralazine    Hypothyroidism - Continue with Synthroid    Lung nodules -Needs CT chest in 3 to 6 months     DVT prophylaxis:  enoxaparin  (LOVENOX ) injection 40 mg Start: 04/26/24 1000     Code Status:   Code Status: Limited: Do not attempt resuscitation (DNR) -DNR-LIMITED -Do Not Intubate/DNI    Subjective:   Mental status improved today Denies nausea vomiting abdominal pain chest pain or cough   Physical Exam:   Constitutional:      General: He is not in acute distress. Appears sleepy Cardiovascular:     Rate and Rhythm: Normal rate and regular rhythm.  Pulmonary:     Effort: Pulmonary effort is normal. No respiratory distress.     Breath sounds: Normal breath sounds. No wheezing.  Abdominal:     General: Bowel sounds are normal. There is no distension.     Palpations: Abdomen is soft.     Tenderness: There is no abdominal tenderness.  Musculoskeletal:        General: Normal range of motion.     Cervical back: Normal range of motion and neck supple.  Skin:    General: Skin is warm and dry.  Neurological:     Comments: Follows commands and moves all 4 extremities.  Underlying dementia appreciated    Vitals:   05/10/24 0725 05/10/24 1409 05/10/24 2013 05/11/24 0449  BP: ROLLEN)  146/59 (!) 158/67 104/65 138/68  Pulse: 67 69 73 66  Resp: 15 16 20 17   Temp: 97.8 F (36.6 C) 97.8 F (36.6 C) 98.7 F (37.1 C) 98.6 F (37 C)  TempSrc: Axillary Oral Oral Oral  SpO2: 97% 100% 97% 97%  Weight:      Height:          Latest Ref Rng & Units 05/11/2024    3:57 AM 05/10/2024    6:09  AM 04/30/2024    3:55 AM  CBC  WBC 4.0 - 10.5 K/uL 7.4  6.3  5.9   Hemoglobin 13.0 - 17.0 g/dL 87.7  88.0  88.9   Hematocrit 39.0 - 52.0 % 39.6  37.9  34.7   Platelets 150 - 400 K/uL 283  247  203        Latest Ref Rng & Units 05/11/2024    3:57 AM 05/10/2024    6:09 AM 04/30/2024    3:55 AM  BMP  Glucose 70 - 99 mg/dL 87  84  86   BUN 8 - 23 mg/dL 24  25  23    Creatinine 0.61 - 1.24 mg/dL 9.25  9.09  9.07   Sodium 135 - 145 mmol/L 139  138  139   Potassium 3.5 - 5.1 mmol/L 4.5  4.5  4.3   Chloride 98 - 111 mmol/L 102  102  105   CO2 22 - 32 mmol/L 25  25  25    Calcium  8.9 - 10.3 mg/dL 9.2  9.2  8.6       Author: Drue ONEIDA Potter, MD 05/11/2024 11:08 AM  For on call review [email or web address].

## 2024-05-12 DIAGNOSIS — I160 Hypertensive urgency: Secondary | ICD-10-CM | POA: Diagnosis not present

## 2024-05-12 LAB — CBC WITH DIFFERENTIAL/PLATELET
Abs Immature Granulocytes: 0.04 K/uL (ref 0.00–0.07)
Basophils Absolute: 0 K/uL (ref 0.0–0.1)
Basophils Relative: 1 %
Eosinophils Absolute: 0.3 K/uL (ref 0.0–0.5)
Eosinophils Relative: 4 %
HCT: 36.1 % — ABNORMAL LOW (ref 39.0–52.0)
Hemoglobin: 11.1 g/dL — ABNORMAL LOW (ref 13.0–17.0)
Immature Granulocytes: 1 %
Lymphocytes Relative: 14 %
Lymphs Abs: 1 K/uL (ref 0.7–4.0)
MCH: 27.7 pg (ref 26.0–34.0)
MCHC: 30.7 g/dL (ref 30.0–36.0)
MCV: 90 fL (ref 80.0–100.0)
Monocytes Absolute: 0.7 K/uL (ref 0.1–1.0)
Monocytes Relative: 10 %
Neutro Abs: 5.2 K/uL (ref 1.7–7.7)
Neutrophils Relative %: 70 %
Platelets: 274 K/uL (ref 150–400)
RBC: 4.01 MIL/uL — ABNORMAL LOW (ref 4.22–5.81)
RDW: 14.6 % (ref 11.5–15.5)
WBC: 7.2 K/uL (ref 4.0–10.5)
nRBC: 0 % (ref 0.0–0.2)

## 2024-05-12 LAB — BASIC METABOLIC PANEL WITH GFR
Anion gap: 11 (ref 5–15)
BUN: 31 mg/dL — ABNORMAL HIGH (ref 8–23)
CO2: 24 mmol/L (ref 22–32)
Calcium: 9.2 mg/dL (ref 8.9–10.3)
Chloride: 101 mmol/L (ref 98–111)
Creatinine, Ser: 1 mg/dL (ref 0.61–1.24)
GFR, Estimated: >60 mL/min (ref >60)
Glucose, Bld: 88 mg/dL (ref 70–99)
Potassium: 4.7 mmol/L (ref 3.5–5.1)
Sodium: 136 mmol/L (ref 135–145)

## 2024-05-12 LAB — GLUCOSE, CAPILLARY: Glucose-Capillary: 98 mg/dL (ref 70–99)

## 2024-05-12 NOTE — Progress Notes (Signed)
 Progress Note   Patient: Julian [name] FMW:[id number] DOB: Jan 16, 1926 DOA: 04/25/2024     13 DOS: the patient was seen and examined on 05/12/2024   Hospital Course: Mr. Senna is a 89 yo male with PMH advanced Alzheimer's dementia with behavioral disturbance, HTN, HLD, hypothyroidism who presented with increased agitation. He was admitted from [facility]. He underwent further workup for contributing etiology of his agitation. CT angio chest/abdomen/pelvis was performed.  He was noted to have a dilated stool-filled rectum with rectal wall thickening concerning for fecal impaction and stercoral colitis. He also had significant urinary retention requiring Foley catheter placement in the ER with 1 L output immediately.  Catheter was continued during hospitalization.   Psychiatry was also consulted given his severe agitation.  He was transitioned from Seroquel  to Rexulti .  Remainder of psychotropic medications were continued.     Assessment & Plan:   Urinary retention - CT on admission noted stool-filled rectum with rectal wall thickening concerning for underlying stercoral colitis which may be contributing to urinary retention along with his behavioral changes and agitation Continue Foley catheter - Continue Flomax  - Foley will remain at discharge and patient will follow-up outpatient with urology on 05/21/2024 for repeat TOV   Physical deconditioning - Patient has had significant physical decline during hospitalization especially now with prolonged hospitalization.  Wife reports that he would ambulate independently at [facility] to meals and back.  PT OT recommends discharge to skilled nursing facility with memory unit TOC working on this     Dementia with behavioral disturbance Acute metabolic encephalopathy -Evaluated by psychiatry as well, appreciate assistance.  Transitioned from Seroquel  to Rexulti  - Could continue up titration of Rexulti  outpatient depending on behavioral  response -Continue Depakote , Ativan  Continue sitter -Has had some sundowning but appears to have at least improved and remaining stable for now Palliative care consulted for input   Stercoral colitis - No impaction noted on DRE per ED PA. - BM have improved - continue laxatives    Hypertensive urgency -resolved - Continue current antihypertensives including amlodipine  and hydralazine    Hypothyroidism - Continue with Synthroid    Lung nodules -Needs CT chest in 3 to 6 months     DVT prophylaxis:  enoxaparin  (LOVENOX ) injection 40 mg Start: 04/26/24 1000     Code Status:   Code Status: Limited: Do not attempt resuscitation (DNR) -DNR-LIMITED -Do Not Intubate/DNI    Subjective:  Mental status worsened today compared to yesterday In fact patient's mental status has been fluctuating Unable to give me subjective information today   Physical Exam:   Constitutional:      General: He is not in acute distress. Appears sleepy Cardiovascular:     Rate and Rhythm: Normal rate and regular rhythm.  Pulmonary:     Effort: Pulmonary effort is normal. No respiratory distress.     Breath sounds: Normal breath sounds. No wheezing.  Abdominal:     General: Bowel sounds are normal. There is no distension.     Palpations: Abdomen is soft.     Tenderness: There is no abdominal tenderness.  Musculoskeletal:        General: Normal range of motion.     Cervical back: Normal range of motion and neck supple.  Skin:    General: Skin is warm and dry.  Neurological: Still confused      Vitals:   05/11/24 2034 05/12/24 0510 05/12/24 0600 05/12/24 1326  BP: 119/84 (!) 178/66 (!) 117/58 (!) 112/57  Pulse: 78 77 60  74  Resp: 20 16 16 18   Temp: 97.7 F (36.5 C) (!) 97.5 F (36.4 C)  98.3 F (36.8 C)  TempSrc: Oral Oral  Oral  SpO2: 100% 93%  97%  Weight:      Height:          Latest Ref Rng & Units 05/12/2024    4:21 AM 05/11/2024    3:57 AM 05/10/2024    6:09 AM  CBC  WBC 4.0 -  10.5 K/uL 7.2  7.4  6.3   Hemoglobin 13.0 - 17.0 g/dL 88.8  87.7  88.0   Hematocrit 39.0 - 52.0 % 36.1  39.6  37.9   Platelets 150 - 400 K/uL 274  283  247        Latest Ref Rng & Units 05/12/2024    4:21 AM 05/11/2024    3:57 AM 05/10/2024    6:09 AM  BMP  Glucose 70 - 99 mg/dL 88  87  84   BUN 8 - 23 mg/dL 31  24  25    Creatinine 0.61 - 1.24 mg/dL 8.99  9.25  9.09   Sodium 135 - 145 mmol/L 136  139  138   Potassium 3.5 - 5.1 mmol/L 4.7  4.5  4.5   Chloride 98 - 111 mmol/L 101  102  102   CO2 22 - 32 mmol/L 24  25  25    Calcium  8.9 - 10.3 mg/dL 9.2  9.2  9.2      Author: Drue ONEIDA Potter, MD 05/12/2024 1:32 PM  For on call review [email or web address].

## 2024-05-12 NOTE — TOC Progression Note (Signed)
 Transition of Care [facility]) - Progression Note    Patient Details  Name: Julian [name] MRN: [id number] Date of Birth: 04-01-1926  [facility] CM/SW Contact  Lorraine LILLETTE Fenton, LCSW Phone Number: 05/12/2024, 2:16 PM  Clinical Narrative:     [place] from RN team- Changing rec back to SNF with a Memory Unit (ex [facility] has one) lock unit. Spouse does NOT want Pt to return to ALF. Needs more care with feeding/nutrition, medication management, Foley will be in place and monitoring of vitals. CSW responded that all SNF with MCU have denied pt, even when CSW called to discuss. Advised that the CSW will follow up on pt plan on Monday. ICM following.   Expected Discharge Plan:  (TBD) Barriers to Discharge: Continued Medical Work up               Expected Discharge Plan and Services                                               Social Drivers of Health (SDOH) Interventions SDOH Screenings   Food Insecurity: Patient Unable To Answer (04/26/2024)  Housing: Unknown (04/26/2024)  Transportation Needs: Patient Unable To Answer (04/26/2024)  Utilities: Patient Unable To Answer (04/26/2024)  Social Connections: Patient Unable To Answer (04/26/2024)  Tobacco Use: Medium Risk (04/26/2024)    Readmission Risk Interventions     No data to display

## 2024-05-12 NOTE — Progress Notes (Addendum)
 Physical Therapy Treatment Patient Details Name: Julian [name] MRN: [id number] DOB: November 07, 1925 Today's Date: 05/12/2024   History of Present Illness 89 year old with past history significant for Alzheimer dementia, behavioral disturbance, hypertension, hyperlipidemia, hypothyroid who presents with increased agitation.    PT Comments  This morning, Pt in bed in restraints.  Spouse assisting with feeding Pt breakfast.  Pt is more lethargic with eyes shut most of session.  Brief responce with slightly increased awakeness when assisted too EOB. Nursing reports a bad night of Sundowning and restlessness.    Today, Pt required increased assist.  General bed mobility comments: required increased, repeat VC's and increased time to transition to EOB.  Once upright, Pt was able to static sit at Supervision level.  Pt was able to hold his milk and drink when prompted. General transfer comment: due to increased grogginess this morning, Pt required increased assist to rise OOB using hand held assist.  Poor forward flexed posture.  Unsteady.  Eyes still mostly closed but following commands. General Gait Details: Pt required increased assist due to grogginess using + 2 hand held assist.  Poor posture.  Slight sway.  Decreased amb distance with increased staggered steps and need for assist for balance.  HIGH FALL RISK.  Spouse present during session.  She does NOT want Pt to return to ALF.  Pt will need SNF level of care.  A Memory Locked Unit would be ideal.  Will update LPT.  Secure chat sent to Team (RN,MD,TOC,OT) D/C to SNL level of care. Spouse agrees.       If plan is discharge home, recommend the following: Two people to help with walking and/or transfers;Two people to help with bathing/dressing/bathroom;Assistance with cooking/housework;Direct supervision/assist for financial management;Help with stairs or ramp for entrance;Assist for transportation;Assistance with feeding   Can travel by private vehicle         Equipment Recommendations       Recommendations for Other Services       Precautions / Restrictions Precautions Precautions: Fall Precaution/Restrictions Comments: Alheimers/Dementia Restrictions Weight Bearing Restrictions Per Provider Order: No     Mobility  Bed Mobility   Bed Mobility: Supine to Sit     Supine to sit: Min assist     General bed mobility comments: required increased, repeat VC's and increased time to transition to EOB.  Once upright, Pt was able to static sit at Supervision level.  Pt was able to hold his milk and drink when prompted.    Transfers Overall transfer level: Needs assistance Equipment used: None Transfers: Sit to/from Stand Sit to Stand: Min assist           General transfer comment: due to increased grogginess this morning, Pt required increased assist to rise OOB using hand held assist.  Poor forward flexed posture.  Unsteady.  Eyes still mostly closed but following commands.    Ambulation/Gait Ambulation/Gait assistance: Min assist Gait Distance (Feet): 38 Feet Assistive device: 2 person hand held assist         General Gait Details: Pt required increased assist due to grogginess using + 2 hand held assist.  Poor posture.  Slight sway.  Decreased amb distance with increased staggered steps and need for assist for balance.  HIGH FALL RISK.   Stairs             Wheelchair Mobility     Tilt Bed    Modified Rankin (Stroke Patients Only)       Balance  Communication    Cognition Arousal: Alert, Lethargic Behavior During Therapy: Flat affect   PT - Cognitive impairments: History of cognitive impairments                       PT - Cognition Comments: This morning, Pt in bed in restraints.  Spouse assisting with feeding Pt breakfast.  Pt is more lethargic with eyes shut most of session.  Brief responce with slightly increased  awakeness when assisted too EOB. Following commands: Impaired Following commands impaired: Follows one step commands with increased time    Cueing    Exercises      General Comments        Pertinent Vitals/Pain Pain Assessment Pain Assessment: No/denies pain    Home Living                          Prior Function            PT Goals (current goals can now be found in the care plan section)      Frequency    Min 2X/week      PT Plan      Co-evaluation              AM-PAC PT 6 Clicks Mobility   Outcome Measure  Help needed turning from your back to your side while in a flat bed without using bedrails?: A Little Help needed moving from lying on your back to sitting on the side of a flat bed without using bedrails?: A Little Help needed moving to and from a bed to a chair (including a wheelchair)?: A Little Help needed standing up from a chair using your arms (e.g., wheelchair or bedside chair)?: A Little Help needed to walk in hospital room?: A Lot Help needed climbing 3-5 steps with a railing? : Total 6 Click Score: 15    End of Session Equipment Utilized During Treatment: Gait belt Activity Tolerance: Patient limited by lethargy Patient left: in chair;with restraints reapplied;with call bell/phone within reach;with chair alarm set;with family/visitor present Nurse Communication: Mobility status PT Visit Diagnosis: Other abnormalities of gait and mobility (R26.89);Difficulty in walking, not elsewhere classified (R26.2)     Time: 9055-8989 PT Time Calculation (min) (ACUTE ONLY): 26 min  Charges:    $Gait Training: 8-22 mins $Therapeutic Activity: 8-22 mins PT General Charges $$ ACUTE PT VISIT: 1 Visit                     Katheryn Leap  PTA Acute  Rehabilitation Services Office M-F          [phone]

## 2024-05-12 NOTE — Plan of Care (Signed)
   Problem: Clinical Measurements: Goal: Will remain free from infection Outcome: Progressing Goal: Diagnostic test results will improve Outcome: Progressing Goal: Respiratory complications will improve Outcome: Progressing Goal: Cardiovascular complication will be avoided Outcome: Progressing

## 2024-05-12 NOTE — Plan of Care (Signed)
  Problem: Elimination: Goal: Will not experience complications related to urinary retention Outcome: Progressing   Problem: Pain Managment: Goal: General experience of comfort will improve and/or be controlled Outcome: Progressing   Problem: Safety: Goal: Ability to remain free from injury will improve Outcome: Progressing   Problem: Skin Integrity: Goal: Risk for impaired skin integrity will decrease Outcome: Progressing

## 2024-05-13 DIAGNOSIS — Z515 Encounter for palliative care: Secondary | ICD-10-CM

## 2024-05-13 DIAGNOSIS — R4689 Other symptoms and signs involving appearance and behavior: Secondary | ICD-10-CM

## 2024-05-13 DIAGNOSIS — I160 Hypertensive urgency: Secondary | ICD-10-CM | POA: Diagnosis not present

## 2024-05-13 DIAGNOSIS — K5900 Constipation, unspecified: Secondary | ICD-10-CM

## 2024-05-13 LAB — CBC WITH DIFFERENTIAL/PLATELET
Abs Immature Granulocytes: 0.04 K/uL (ref 0.00–0.07)
Basophils Absolute: 0 K/uL (ref 0.0–0.1)
Basophils Relative: 1 %
Eosinophils Absolute: 0.3 K/uL (ref 0.0–0.5)
Eosinophils Relative: 4 %
HCT: 37 % — ABNORMAL LOW (ref 39.0–52.0)
Hemoglobin: 11.2 g/dL — ABNORMAL LOW (ref 13.0–17.0)
Immature Granulocytes: 1 %
Lymphocytes Relative: 11 %
Lymphs Abs: 0.9 K/uL (ref 0.7–4.0)
MCH: 27 pg (ref 26.0–34.0)
MCHC: 30.3 g/dL (ref 30.0–36.0)
MCV: 89.2 fL (ref 80.0–100.0)
Monocytes Absolute: 0.9 K/uL (ref 0.1–1.0)
Monocytes Relative: 10 %
Neutro Abs: 6.1 K/uL (ref 1.7–7.7)
Neutrophils Relative %: 73 %
Platelets: 266 K/uL (ref 150–400)
RBC: 4.15 MIL/uL — ABNORMAL LOW (ref 4.22–5.81)
RDW: 14.4 % (ref 11.5–15.5)
WBC: 8.3 K/uL (ref 4.0–10.5)
nRBC: 0 % (ref 0.0–0.2)

## 2024-05-13 LAB — BASIC METABOLIC PANEL WITH GFR
Anion gap: 10 (ref 5–15)
BUN: 26 mg/dL — ABNORMAL HIGH (ref 8–23)
CO2: 26 mmol/L (ref 22–32)
Calcium: 9.2 mg/dL (ref 8.9–10.3)
Chloride: 99 mmol/L (ref 98–111)
Creatinine, Ser: 0.87 mg/dL (ref 0.61–1.24)
GFR, Estimated: >60 mL/min (ref >60)
Glucose, Bld: 103 mg/dL — ABNORMAL HIGH (ref 70–99)
Potassium: 4.7 mmol/L (ref 3.5–5.1)
Sodium: 136 mmol/L (ref 135–145)

## 2024-05-13 NOTE — Plan of Care (Signed)
  Problem: Clinical Measurements: Goal: Will remain free from infection Outcome: Progressing Goal: Respiratory complications will improve Outcome: Progressing Goal: Cardiovascular complication will be avoided Outcome: Progressing   Problem: Nutrition: Goal: Adequate nutrition will be maintained Outcome: Progressing   Problem: Elimination: Goal: Will not experience complications related to bowel motility Outcome: Progressing Goal: Will not experience complications related to urinary retention Outcome: Progressing   Problem: Pain Managment: Goal: General experience of comfort will improve and/or be controlled Outcome: Progressing   Problem: Safety: Goal: Ability to remain free from injury will improve Outcome: Progressing   Problem: Skin Integrity: Goal: Risk for impaired skin integrity will decrease Outcome: Progressing   Problem: Activity: Goal: Risk for activity intolerance will decrease Outcome: Not Progressing   Problem: Safety: Goal: Non-violent Restraint(s) Outcome: Not Progressing

## 2024-05-13 NOTE — Consult Note (Signed)
 Consultation Note Date: 05/13/2024   Patient Name: Julian [name]  DOB: 04/03/1926  MRN: [id number]  Age / Sex: 89 y.o., male  PCP: Julian [name]., MD Referring Physician: Cheryle Page, MD  Reason for Consultation: Establishing goals of care  HPI/Patient Profile: 89 y.o. male admitted on 04/25/2024   89 yo male with PMH advanced Alzheimer's dementia with behavioral disturbance, HTN, HLD, hypothyroidism presents with increased agitation.  On presentation, CT angio of chest/abdomen and pelvis was performed: Showed findings concerning for fecal impaction and stercoral colitis.  He also had urinary retention requiring Foley catheter placement in the ED.  Psychiatry consulted for severe agitation: Transition from Seroquel  to Rexulti .  PT recommended SNF placement.  TOC following for placement.    Clinical Assessment and Goals of Care: Palliative consulted for additional support.  Elderly gentleman resting in bed, legs dangling off the bed. Answers a few questions appropriately Not agitated currently Call placed and discussed with wife.   Palliative medicine is specialized medical care for people living with serious illness. It focuses on providing relief from the symptoms and stress of a serious illness. The goal is to improve quality of life for both the patient and the family.  Goals of care: Broad aims of medical therapy in relation to the patient's values and preferences. Our aim is to provide medical care aimed at enabling patients to achieve the goals that matter most to them, given the circumstances of their particular medical situation and their constraints.   NEXT OF KIN  Wife Julian [name] 2 daughters who are RNs.   SUMMARY OF RECOMMENDATIONS    Agree with DNR Continue with current mode of care Continue current pain and non-pain regimen Wife request Urology consult regarding the Foley - she is  concerned that he might pull his Foley out, she asks whether the Foley will be a permanent thing from now on or not.  She asks for [facility] to get in touch with her on 05-14-24 with regards to [facility] facility's decision to accept him back or not.  Agree with Rexulti  and Depakote , avoid Benzo's in elderly patients in order to avoid paradoxical agitation.  Continue current bowel regimen.  Thank you for the consult.   Code Status/Advance Care Planning: DNR   Symptom Management:     Palliative Prophylaxis:  Delirium Protocol   Psycho-social/Spiritual:  Desire for further Chaplaincy support:yes Additional Recommendations: Caregiving  Support/Resources  Prognosis:  Unable to determine  Discharge Planning: To Be Determined      Primary Diagnoses: Present on Admission:  Hypertensive urgency  Dementia with behavioral disturbance (HCC)  Acute urinary retention  Stercoral colitis  Hypothyroidism  Lung nodule   I have reviewed the medical record, interviewed the patient and family, and examined the patient. The following aspects are pertinent.  Past Medical History:  Diagnosis Date   Alzheimer's dementia (HCC)    Anemia    Dementia (HCC)    Hyperlipidemia    Hypertension    Prostate cancer [facility])    Social  History   Socioeconomic History   Marital status: Married    Spouse name: Not on file   Number of children: 3   Years of education: 92   Highest education level: Not on file  Occupational History   Not on file  Tobacco Use   Smoking status: Former    Current packs/day: 0.00    Types: Cigarettes    Quit date: 09/30/1981    Years since quitting: 42.6   Smokeless tobacco: Never  Substance and Sexual Activity   Alcohol  use: Yes    Comment: 5 oz a week   Drug use: No   Sexual activity: Not on file  Other Topics Concern   Not on file  Social History Narrative   Left handed   Two story home   Drinks caffeine coffee   Lives with wife in home   retired    Social Drivers of Corporate investment banker Strain: Not on file  Food Insecurity: Patient Unable To Answer (04/26/2024)   Hunger Vital Sign    Worried About Running Out of Food in the Last Year: Patient unable to answer    Ran Out of Food in the Last Year: Patient unable to answer  Transportation Needs: Patient Unable To Answer (04/26/2024)   PRAPARE - Transportation    Lack of Transportation (Medical): Patient unable to answer    Lack of Transportation (Non-Medical): Patient unable to answer  Physical Activity: Not on file  Stress: Not on file  Social Connections: Patient Unable To Answer (04/26/2024)   Social Connection and Isolation Panel    Frequency of Communication with Friends and Family: Patient unable to answer    Frequency of Social Gatherings with Friends and Family: Patient unable to answer    Attends Religious Services: Patient unable to answer    Active Member of Clubs or Organizations: Patient unable to answer    Attends Banker Meetings: Patient unable to answer    Marital Status: Patient unable to answer   Family History  Problem Relation Age of Onset   CAD Mother    CAD Father    Dementia Sister    Heart attack Sister    Alzheimer's disease Brother    Scheduled Meds:  amLODipine   2.5 mg Oral Daily   brexpiprazole   1 mg Oral Daily   Chlorhexidine  Gluconate Cloth  6 each Topical Daily   citalopram   20 mg Oral QHS   divalproex   250 mg Oral Q12H   enoxaparin  (LOVENOX ) injection  40 mg Subcutaneous Q24H   feeding supplement  237 mL Oral BID BM   levothyroxine   75 mcg Oral Q0600   LORazepam   0.5 mg Oral Q1400   losartan   100 mg Oral Daily   senna-docusate  1 tablet Oral BID   sodium chloride  flush  3 mL Intravenous Q12H   tamsulosin   0.4 mg Oral Daily   Continuous Infusions: PRN Meds:.acetaminophen  **OR** acetaminophen , haloperidol  lactate, hydrALAZINE , labetalol , LORazepam , polyethylene glycol, prochlorperazine  Medications Prior to Admission:   Prior to Admission medications   Medication Sig Start Date End Date Taking? Authorizing Provider  Cholecalciferol (VITAMIN D3) 125 MCG (5000 UT) CAPS Take 5,000 Units by mouth daily.   Yes [provider]  citalopram  (CELEXA ) 20 MG tablet Take 20 mg by mouth at bedtime.   Yes [provider]  divalproex  (DEPAKOTE  SPRINKLE) 125 MG capsule Take 250 mg by mouth in the morning and at bedtime.   Yes [provider]  fluticasone OREN)  50 MCG/ACT nasal spray Place 2 sprays into both nostrils at bedtime.   Yes [provider]  levothyroxine  (SYNTHROID ) 75 MCG tablet Take 75 mcg by mouth daily before breakfast.   Yes [provider]  LORazepam  (ATIVAN ) 0.5 MG tablet Take 0.5 mg by mouth See admin instructions. Take 0.5 mg by mouth at 2 PM and an additional 0.5 mg every 8 hours as needed for agitation 07/20/23  Yes [provider]  losartan  (COZAAR ) 100 MG tablet TAKE 1 TABLET BY MOUTH DAILY Patient taking differently: Take 100 mg by mouth in the morning. 12/16/22  Yes Nahser, Aleene PARAS, MD  Multiple Vitamins-Minerals (CENTRUM SILVER PO) Take 1 tablet by mouth daily with breakfast.   Yes [provider]  mupirocin ointment (BACTROBAN) 2 % Apply 1 Application topically See admin instructions. One application to affected arm once daily as needed for skin tears   Yes [provider]  Propylene Glycol (SYSTANE BALANCE) 0.6 % SOLN Place 1 drop into both eyes in the morning.   Yes [provider]  QUEtiapine  (SEROQUEL ) 100 MG tablet Take 100 mg by mouth 2 (two) times daily.   Yes [provider]  amLODipine  (NORVASC ) 2.5 MG tablet TAKE 1 TABLET BY MOUTH DAILY Patient not taking: Reported on 04/26/2024 12/29/23   Nahser, Aleene PARAS, MD  atorvastatin  (LIPITOR) 20 MG tablet Take 20 mg by mouth at bedtime. Patient not taking: Reported on 04/26/2024 07/07/23   [provider]  azithromycin  (ZITHROMAX ) 100 MG/5ML suspension  500 mg (25mL) on day one, followed by 250 mg (12.62mL) on days 2,3,4, and 5. Patient not taking: Reported on 04/26/2024 10/10/23   Trine Raynell Moder, MD   No Known Allergies Review of Systems + confused  Physical Exam Appears chronically ill and deconditioned.  Bilateral diminished breath sounds Has chest brace on Responds some to questions asked Not agitated currently  Vital Signs: BP (!) 146/64 (BP Location: Left Arm)   Pulse 75   Temp 98 F (36.7 C) (Oral)   Resp 18   Ht 5' 10 (1.778 m)   Wt 67.5 kg   SpO2 96%   BMI 21.35 kg/m  Pain Scale: Faces   Pain Score: 0-No pain   SpO2: SpO2: 96 % O2 Device:SpO2: 96 % O2 Flow Rate: .O2 Flow Rate (L/min): 0 L/min  IO: Intake/output summary:  Intake/Output Summary (Last 24 hours) at 05/13/2024 1417 Last data filed at 05/13/2024 1300 Gross per 24 hour  Intake 800 ml  Output 1550 ml  Net -750 ml    LBM: Last BM Date : 05/12/24 Baseline Weight: Weight: 67.5 kg Most recent weight: Weight: 67.5 kg     Palliative Assessment/Data:   PPS 40%  Time In:  12 Time Out:  1320 Time Total:  80 Greater than 50%  of this time was spent counseling and coordinating care related to the above assessment and plan.  Signed by: Lonia Serve, MD   Please contact Palliative Medicine Team phone at [phone] for questions and concerns.  For individual provider: See Tracey

## 2024-05-13 NOTE — Progress Notes (Signed)
 PROGRESS NOTE    Julian [name]  FMW:[id number] DOB: 12-26-1925 DOA: 04/25/2024 PCP: Loreli Elsie [name], MD   Brief Narrative:  89 yo male with PMH advanced Alzheimer's dementia with behavioral disturbance, HTN, HLD, hypothyroidism presents with increased agitation.  On presentation, CT angio of chest/abdomen and pelvis was performed: Showed findings concerning for fecal impaction and stercoral colitis.  He also had urinary retention requiring Foley catheter placement in the ED.  Psychiatry consulted for severe agitation: Transition from Seroquel  to Rexulti .  PT recommended SNF placement.  TOC following for placement.  Currently medically stable for discharge.  Assessment & Plan:   Urinary retention BPH - Needed Foley catheter placement on admission.  Continue Flomax .  Foley will remain at discharge: Will need outpatient follow-up with urology  Dementia with behavioral disturbance Acute metabolic encephalopathy Physical deconditioning -Psychiatry following intermittently.  Seroquel  has been transitioned to Rexulti .  Continue lorazepam  and Depakote .  [employer]porate investment banker. - Has had intermittent sundowning, otherwise stable currently - Consult palliative care -  PT recommended SNF placement.  TOC following for placement.  Currently medically stable for discharge.  Stercoral colitis Constipation - Continue stool softeners and as needed laxatives  Hypertensive urgency -Resolved.  Continue amlodipine  and losartan   Hypothyroidism -Continue levothyroxine   Lung nodules -Will need CT chest in 3 to 6 months    DVT prophylaxis: Lovenox  Code Status: DNR Family Communication: None at bedside Disposition Plan: Status is: Inpatient Remains inpatient appropriate because: Of severity of illness.  Need for SNF placement    Consultants: Psychiatry/palliative care  Procedures: None  Antimicrobials: None    Subjective: Patient seen and examined at bedside.  Poor historian.  No seizures,  vomiting, fever reported.  Needed Haldol  last evening as per nursing staff. Objective: Vitals:   05/12/24 0600 05/12/24 1326 05/12/24 2240 05/13/24 0637  BP: (!) 117/58 (!) 112/57 (!) 159/84 (!) 154/81  Pulse: 60 74 79 70  Resp: 16 18 18 19   Temp:  98.3 F (36.8 C) 98.7 F (37.1 C) 97.8 F (36.6 C)  TempSrc:  Oral Oral Oral  SpO2:  97% 100% 97%  Weight:      Height:        Intake/Output Summary (Last 24 hours) at 05/13/2024 0817 Last data filed at 05/13/2024 9360 Gross per 24 hour  Intake 240 ml  Output 1400 ml  Net -1160 ml   Filed Weights   04/26/24 0230  Weight: 67.5 kg    Examination:  General exam: Appears calm and comfortable.  Looks chronically ill and deconditioned. Respiratory system: Bilateral decreased breath sounds at bases, no wheezing Cardiovascular system: S1 & S2 heard, Rate controlled Gastrointestinal system: Abdomen is nondistended, soft and nontender. Normal bowel sounds heard. Extremities: No cyanosis, clubbing, edema  Central nervous system: Awake, slow to respond, confused.  No focal neurological deficits. Moving extremities Skin: No rashes, lesions or ulcers Psychiatry: Not agitated currently.  Flat affect.   Data Reviewed: I have personally reviewed following labs and imaging studies  CBC: Recent Labs  Lab 05/10/24 0609 05/11/24 0357 05/12/24 0421 05/13/24 0447  WBC 6.3 7.4 7.2 8.3  NEUTROABS 4.3 4.9 5.2 6.1  HGB 11.9* 12.2* 11.1* 11.2*  HCT 37.9* 39.6 36.1* 37.0*  MCV 89.0 89.2 90.0 89.2  PLT 247 283 274 266   Basic Metabolic Panel: Recent Labs  Lab 05/10/24 0609 05/11/24 0357 05/12/24 0421 05/13/24 0447  NA 138 139 136 136  K 4.5 4.5 4.7 4.7  CL 102 102 101 99  CO2 25  25 24 26   GLUCOSE 84 87 88 103*  BUN 25* 24* 31* 26*  CREATININE 0.90 0.74 1.00 0.87  CALCIUM  9.2 9.2 9.2 9.2   GFR: Estimated Creatinine Clearance: 47.4 mL/min (by C-G formula based on SCr of 0.87 mg/dL). Liver Function Tests: No results for input(s):  AST, ALT, ALKPHOS, BILITOT, PROT, ALBUMIN in the last 168 hours. No results for input(s): LIPASE, AMYLASE in the last 168 hours. No results for input(s): AMMONIA in the last 168 hours. Coagulation Profile: No results for input(s): INR, PROTIME in the last 168 hours. Cardiac Enzymes: No results for input(s): CKTOTAL, CKMB, CKMBINDEX, TROPONINI in the last 168 hours. BNP (last 3 results) Recent Labs    04/26/24 0407  PROBNP 520.0*   HbA1C: No results for input(s): HGBA1C in the last 72 hours. CBG: Recent Labs  Lab 05/12/24 0646  GLUCAP 98   Lipid Profile: No results for input(s): CHOL, HDL, LDLCALC, TRIG, CHOLHDL, LDLDIRECT in the last 72 hours. Thyroid  Function Tests: No results for input(s): TSH, T4TOTAL, FREET4, T3FREE, THYROIDAB in the last 72 hours. Anemia Panel: No results for input(s): VITAMINB12, FOLATE, FERRITIN, TIBC, IRON, RETICCTPCT in the last 72 hours. Sepsis Labs: No results for input(s): PROCALCITON, LATICACIDVEN in the last 168 hours.  No results found for this or any previous visit (from the past 240 hours).       Radiology Studies: No results found.      Scheduled Meds:  amLODipine   2.5 mg Oral Daily   brexpiprazole   1 mg Oral Daily   Chlorhexidine  Gluconate Cloth  6 each Topical Daily   citalopram   20 mg Oral QHS   divalproex   250 mg Oral Q12H   enoxaparin  (LOVENOX ) injection  40 mg Subcutaneous Q24H   feeding supplement  237 mL Oral BID BM   levothyroxine   75 mcg Oral Q0600   LORazepam   0.5 mg Oral Q1400   losartan   100 mg Oral Daily   senna-docusate  1 tablet Oral BID   sodium chloride  flush  3 mL Intravenous Q12H   tamsulosin   0.4 mg Oral Daily   Continuous Infusions:        Sophie Mao, MD [facility] 05/13/2024, 8:17 AM

## 2024-05-13 NOTE — Plan of Care (Signed)
   Problem: Clinical Measurements: Goal: Will remain free from infection Outcome: Progressing Goal: Diagnostic test results will improve Outcome: Progressing Goal: Respiratory complications will improve Outcome: Progressing Goal: Cardiovascular complication will be avoided Outcome: Progressing   Problem: Activity: Goal: Risk for activity intolerance will decrease Outcome: Progressing

## 2024-05-14 DIAGNOSIS — R4689 Other symptoms and signs involving appearance and behavior: Secondary | ICD-10-CM | POA: Diagnosis not present

## 2024-05-14 DIAGNOSIS — Z515 Encounter for palliative care: Secondary | ICD-10-CM | POA: Diagnosis not present

## 2024-05-14 DIAGNOSIS — I160 Hypertensive urgency: Secondary | ICD-10-CM | POA: Diagnosis not present

## 2024-05-14 DIAGNOSIS — K5900 Constipation, unspecified: Secondary | ICD-10-CM | POA: Diagnosis not present

## 2024-05-14 NOTE — Progress Notes (Signed)
 Physical Therapy Treatment Patient Details Name: Julian [name] MRN: [id number] DOB: [birth year] Today's Date: 05/14/2024   History of Present Illness 89 year old with past history significant for Alzheimer dementia, behavioral disturbance, hypertension, hyperlipidemia, hypothyroid who presents with increased agitation.    PT Comments  PT - Cognition Comments: Hx Alzheimers/dementia/Sundowning.  Pt was OOB in recliner with Spouse shaving him.  Pt able to follow repeat commands.  Few words. Assisted out of recliner to amb to the bathroom.  General transfer comment: requires assist for direction.  Also assisted with a toilet transfer.  Pt on toilet extended time for a BM. General Gait Details: requiring Mod Assist HHA for balance and directions.  NO AD due to cognition.  Posture is poor forward flexed.  Steps are short and shuffled. Amb distance limited due to fatigue.   LPT has rec Pt will need ST Rehab at SNF to address mobility and functional decline prior to safely returning home.    If plan is discharge home, recommend the following: Two people to help with walking and/or transfers;Two people to help with bathing/dressing/bathroom;Assistance with cooking/housework;Direct supervision/assist for financial management;Help with stairs or ramp for entrance;Assist for transportation;Assistance with feeding   Can travel by private vehicle        Equipment Recommendations  None recommended by PT    Recommendations for Other Services       Precautions / Restrictions Precautions Precautions: Fall Precaution/Restrictions Comments: Alheimers/Dementia Restrictions Weight Bearing Restrictions Per Provider Order: No     Mobility  Bed Mobility               General bed mobility comments: OOB in recliner    Transfers Overall transfer level: Needs assistance Equipment used: None Transfers: Sit to/from Stand Sit to Stand: Min assist, Mod assist Stand pivot transfers: Min assist, Mod  assist         General transfer comment: requires assist for direction.  Also assisted with a toilet transfer.  Pt on toilet extended time for a BM.    Ambulation/Gait Ambulation/Gait assistance: Min assist, Mod assist Gait Distance (Feet): 42 Feet Assistive device: 2 person hand held assist Gait Pattern/deviations: Step-through pattern, Decreased stride length Gait velocity: decreased     General Gait Details: requiring Mod Assist HHA for balance and directions.  NO AD due to cognition.  Posture is poor forward flexed.  Steps are short and shuffled.   Stairs             Wheelchair Mobility     Tilt Bed    Modified Rankin (Stroke Patients Only)       Balance                                            Communication Communication Communication: Impaired Factors Affecting Communication: Hearing impaired  Cognition Arousal: Alert Behavior During Therapy: Flat affect   PT - Cognitive impairments: History of cognitive impairments                       PT - Cognition Comments: Hx Alzheimers/dementia/Sundowning.  Pt was OOB in recliner with Spouse shaving him.  Pt able to follow repeat commands.  Few words. Following commands: Impaired Following commands impaired: Follows one step commands with increased time    Cueing Cueing Techniques: Verbal cues, Gestural cues, Tactile cues, Visual cues  Exercises  General Comments        Pertinent Vitals/Pain Pain Assessment Pain Assessment: No/denies pain    Home Living                          Prior Function            PT Goals (current goals can now be found in the care plan section) Progress towards PT goals: Progressing toward goals    Frequency    Min 2X/week      PT Plan      Co-evaluation              AM-PAC PT 6 Clicks Mobility   Outcome Measure  Help needed turning from your back to your side while in a flat bed without using bedrails?:  A Lot Help needed moving from lying on your back to sitting on the side of a flat bed without using bedrails?: A Lot Help needed moving to and from a bed to a chair (including a wheelchair)?: A Lot Help needed standing up from a chair using your arms (e.g., wheelchair or bedside chair)?: A Lot Help needed to walk in hospital room?: A Lot Help needed climbing 3-5 steps with a railing? : Total 6 Click Score: 11    End of Session Equipment Utilized During Treatment: Gait belt Activity Tolerance: Patient limited by fatigue Patient left: in chair;with call bell/phone within reach;with chair alarm set;with family/visitor present Nurse Communication: Mobility status PT Visit Diagnosis: Other abnormalities of gait and mobility (R26.89);Difficulty in walking, not elsewhere classified (R26.2)     Time: 1113-1140 PT Time Calculation (min) (ACUTE ONLY): 27 min  Charges:    $Gait Training: 8-22 mins $Therapeutic Activity: 8-22 mins PT General Charges $$ ACUTE PT VISIT: 1 Visit                     Katheryn Leap  PTA Acute  Rehabilitation Services Office M-F          [phone]

## 2024-05-14 NOTE — Progress Notes (Signed)
 Mobility Specialist - Progress Note   05/14/24 1333  Mobility  Activity Pivoted/transferred from bed to chair;Stood at bedside  Level of Assistance Moderate assist, patient does 50-74%  Assistive Device Other (Comment) (HHA)  Range of Motion/Exercises Active  Activity Response Tolerated well  Mobility visit 1 Mobility  Mobility Specialist Start Time (ACUTE ONLY) 1323  Mobility Specialist Stop Time (ACUTE ONLY) 1333  Mobility Specialist Time Calculation (min) (ACUTE ONLY) 10 min   Pt was found trying to get out of recliner chair. Assisted to bed. NT in room and assisted with pt with pericare. At EOS was left in bed with posey belt on. All needs met. Call bell in reach.   Erminio Leos,  Mobility Specialist Can be reached via Secure Chat

## 2024-05-14 NOTE — TOC Progression Note (Addendum)
 Transition of Care [facility]) - Progression Note    Patient Details  Name: Julian [name] MRN: [id number] Date of Birth: Nov 27, 1925  Transition [facility] CM/SW Contact  [name], LCSW Phone Number: 05/14/2024, 10:36 AM  Clinical Narrative:     CSW spoke with [facility]; they reported that the pt is not able to return to the facility due to his behaviors. Care management will follow up  [place]  11:30pm Spoke with pt's wife and provided update.IP care management to follow.   2:00pm CSW sent referral to [facility] to review for possible placement. Care management to follow.    Expected Discharge Plan:  (TBD) Barriers to Discharge: Continued Medical Work up               Expected Discharge Plan and Services                                               Social Drivers of Health (SDOH) Interventions SDOH Screenings   Food Insecurity: Patient Unable To Answer (04/26/2024)  Housing: Unknown (04/26/2024)  Transportation Needs: Patient Unable To Answer (04/26/2024)  Utilities: Patient Unable To Answer (04/26/2024)  Social Connections: Patient Unable To Answer (04/26/2024)  Tobacco Use: Medium Risk (04/26/2024)    Readmission Risk Interventions     No data to display

## 2024-05-14 NOTE — Progress Notes (Addendum)
 PROGRESS NOTE    Julian [name]  FMW:[id number] DOB: [birth year] DOA: 04/25/2024 PCP: Loreli Elsie [name], MD   Brief Narrative:  89 yo male with PMH advanced Alzheimer's dementia with behavioral disturbance, HTN, HLD, hypothyroidism presents with increased agitation.  On presentation, CT angio of chest/abdomen and pelvis was performed: Showed findings concerning for fecal impaction and stercoral colitis.  He also had urinary retention requiring Foley catheter placement in the ED.  Psychiatry consulted for severe agitation: Transition from Seroquel  to Rexulti .  PT recommended SNF placement.  TOC following for placement.  Currently medically stable for discharge.  Assessment & Plan:   Urinary retention BPH - Needed Foley catheter placement on admission.  Continue Flomax .  Foley will remain at discharge: Will need outpatient follow-up with urology  Dementia with behavioral disturbance Acute metabolic encephalopathy Physical deconditioning -Psychiatry following intermittently.  Seroquel  has been transitioned to Rexulti .  Continue lorazepam  and Depakote .  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 -  PT recommended SNF placement.  TOC following for placement.  Currently medically stable for discharge.  Stercoral colitis Constipation - Continue stool softeners and as needed laxatives  Hypertensive urgency -Resolved.  Continue amlodipine  and losartan   Hypothyroidism -Continue levothyroxine   Lung nodules -Will need CT chest in 3 to 6 months    DVT prophylaxis: Lovenox  Code Status: DNR Family Communication: Wife at bedside Disposition Plan: Status is: Inpatient Remains inpatient appropriate because: Of severity of illness.  Need for SNF placement.  Currently medically stable for discharge.    Consultants: Psychiatry/palliative care  Procedures: None  Antimicrobials: None    Subjective: Patient  seen and examined at bedside.  Poor historian.  No fever, vomiting or seizures reported.  Required Haldol  again last night as per nursing staff.  Objective: Vitals:   05/12/24 2240 05/13/24 0637 05/13/24 1343 05/14/24 0539  BP: (!) 159/84 (!) 154/81 (!) 146/64 (!) 144/62  Pulse: 79 70 75 (!) 58  Resp: 18 19 18 18   Temp: 98.7 F (37.1 C) 97.8 F (36.6 C) 98 F (36.7 C) 97.9 F (36.6 C)  TempSrc: Oral Oral Oral Oral  SpO2: 100% 97% 96% 98%  Weight:      Height:        Intake/Output Summary (Last 24 hours) at 05/14/2024 0805 Last data filed at 05/14/2024 0550 Gross per 24 hour  Intake 560 ml  Output 1300 ml  Net -740 ml   Filed Weights   04/26/24 0230  Weight: 67.5 kg    Examination:  General: On room air.  No distress ENT/neck: No thyromegaly.  JVD is not elevated  respiratory: Decreased breath sounds at bases bilaterally with some crackles; no wheezing  CVS: S1-S2 heard, rate controlled currently Abdominal: Soft, nontender, slightly distended; no organomegaly, normal bowel sounds are heard Extremities: Trace lower extremity edema; no cyanosis  CNS: Awake, extremely slow to respond and confused.  No focal neurologic deficit.  Moves extremities Lymph: No obvious lymphadenopathy Skin: No obvious ecchymosis/lesions  psych: Currently not agitated with mostly flat affect. musculoskeletal: No obvious joint swelling/deformity    Data Reviewed: I have personally reviewed following labs and imaging studies  CBC: Recent Labs  Lab 05/10/24 0609 05/11/24 0357 05/12/24 0421 05/13/24 0447  WBC 6.3 7.4 7.2 8.3  NEUTROABS 4.3 4.9 5.2 6.1  HGB 11.9* 12.2* 11.1* 11.2*  HCT 37.9* 39.6 36.1* 37.0*  MCV 89.0 89.2 90.0 89.2  PLT 247 283 274 266   Basic Metabolic Panel: Recent  Labs  Lab 05/10/24 0609 05/11/24 0357 05/12/24 0421 05/13/24 0447  NA 138 139 136 136  K 4.5 4.5 4.7 4.7  CL 102 102 101 99  CO2 25 25 24 26   GLUCOSE 84 87 88 103*  BUN 25* 24* 31* 26*  CREATININE  0.90 0.74 1.00 0.87  CALCIUM  9.2 9.2 9.2 9.2   GFR: Estimated Creatinine Clearance: 47.4 mL/min (by C-G formula based on SCr of 0.87 mg/dL). Liver Function Tests: No results for input(s): AST, ALT, ALKPHOS, BILITOT, PROT, ALBUMIN in the last 168 hours. No results for input(s): LIPASE, AMYLASE in the last 168 hours. No results for input(s): AMMONIA in the last 168 hours. Coagulation Profile: No results for input(s): INR, PROTIME in the last 168 hours. Cardiac Enzymes: No results for input(s): CKTOTAL, CKMB, CKMBINDEX, TROPONINI in the last 168 hours. BNP (last 3 results) Recent Labs    04/26/24 0407  PROBNP 520.0*   HbA1C: No results for input(s): HGBA1C in the last 72 hours. CBG: Recent Labs  Lab 05/12/24 0646  GLUCAP 98   Lipid Profile: No results for input(s): CHOL, HDL, LDLCALC, TRIG, CHOLHDL, LDLDIRECT in the last 72 hours. Thyroid  Function Tests: No results for input(s): TSH, T4TOTAL, FREET4, T3FREE, THYROIDAB in the last 72 hours. Anemia Panel: No results for input(s): VITAMINB12, FOLATE, FERRITIN, TIBC, IRON, RETICCTPCT in the last 72 hours. Sepsis Labs: No results for input(s): PROCALCITON, LATICACIDVEN in the last 168 hours.  No results found for this or any previous visit (from the past 240 hours).       Radiology Studies: No results found.      Scheduled Meds:  amLODipine   2.5 mg Oral Daily   brexpiprazole   1 mg Oral Daily   Chlorhexidine  Gluconate Cloth  6 each Topical Daily   citalopram   20 mg Oral QHS   divalproex   250 mg Oral Q12H   enoxaparin  (LOVENOX ) injection  40 mg Subcutaneous Q24H   feeding supplement  237 mL Oral BID BM   levothyroxine   75 mcg Oral Q0600   LORazepam   0.5 mg Oral Q1400   losartan   100 mg Oral Daily   senna-docusate  1 tablet Oral BID   sodium chloride  flush  3 mL Intravenous Q12H   tamsulosin   0.4 mg Oral Daily   Continuous  Infusions:        Sophie Mao, MD [facility] 05/14/2024, 8:05 AM

## 2024-05-14 NOTE — Progress Notes (Signed)
 Daily Progress Note   Patient Name: Julian [name]       Date: 05/14/2024 DOB: 10/28/1925  Age: 89 y.o. MRN#: [id number] Attending Physician: Cheryle Page, MD Primary Care Physician: Loreli Elsie [name], MD Admit Date: 04/25/2024  Reason for Consultation/Follow-up: Establishing goals of care  Subjective:  Resting in bed, no distress, pleasantly confused.   Length of Stay: 15  Current Medications: Scheduled Meds:   amLODipine   2.5 mg Oral Daily   brexpiprazole   1 mg Oral Daily   Chlorhexidine  Gluconate Cloth  6 each Topical Daily   citalopram   20 mg Oral QHS   divalproex   250 mg Oral Q12H   enoxaparin  (LOVENOX ) injection  40 mg Subcutaneous Q24H   feeding supplement  237 mL Oral BID BM   levothyroxine   75 mcg Oral Q0600   LORazepam   0.5 mg Oral Q1400   losartan   100 mg Oral Daily   senna-docusate  1 tablet Oral BID   sodium chloride  flush  3 mL Intravenous Q12H   tamsulosin   0.4 mg Oral Daily    Continuous Infusions:   PRN Meds: acetaminophen  **OR** acetaminophen , haloperidol  lactate, hydrALAZINE , labetalol , LORazepam , polyethylene glycol, prochlorperazine   Physical Exam         Frail and weak appearing No distress Appears weak  Vital Signs: BP 111/61 (BP Location: Right Arm)   Pulse 70   Temp 98.2 F (36.8 C) (Oral)   Resp 18   Ht 5' 10 (1.778 m)   Wt 67.5 kg   SpO2 95%   BMI 21.35 kg/m  SpO2: SpO2: 95 % O2 Device: O2 Device: Room Air O2 Flow Rate: O2 Flow Rate (L/min): 0 L/min  Intake/output summary:  Intake/Output Summary (Last 24 hours) at 05/14/2024 1453 Last data filed at 05/14/2024 9060 Gross per 24 hour  Intake 240 ml  Output 1150 ml  Net -910 ml   LBM: Last BM Date : 05/12/24 Baseline Weight: Weight: 67.5 kg Most recent weight: Weight: 67.5  kg       Palliative Assessment/Data:      Patient Active Problem List   Diagnosis Date Noted   Hypertensive urgency 04/26/2024   Acute urinary retention 04/26/2024   Stercoral colitis 04/26/2024   Hypothyroidism 04/26/2024   Lung nodule 04/26/2024   Dementia with behavioral disturbance (HCC) 07/31/2023   Memory  impairment 10/29/2022   RBBB 06/14/2018   Hyperlipidemia 01/26/2017   TIA (transient ischemic attack) 09/23/2016   Carotid artery disease 10/27/2015   HTN (hypertension) 10/23/2014   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  On presentation, CT angio of chest/abdomen and pelvis was performed: Showed findings concerning for fecal impaction and stercoral colitis.  He also had urinary retention requiring Foley catheter placement in the ED.  Psychiatry consulted for severe agitation: Transition from Seroquel  to Rexulti .  PT recommended SNF placement.  TOC following for placement.     Recommendations/Plan: TOC assisting with a safe disposition towards the end of this hospitalization - awaiting outcome of referral to [facility]. Recommend hospice services continuation at his next facility.  Continue current mode of care.     Code Status:    Code Status Orders  (From admission, onward)           Start     Ordered   04/26/24 0236  Do not attempt resuscitation (DNR)- Limited -Do Not Intubate (DNI)  Continuous       Question Answer Comment  If pulseless and not breathing No CPR or chest compressions.   In Pre-Arrest Conditions (Patient Is Breathing and Has A Pulse) Do not intubate. Provide all appropriate non-invasive medical interventions. Avoid ICU transfer unless indicated or required.    Consent: Discussion documented in EHR or advanced directives reviewed      04/26/24 0236           Code Status History     This patient has a current code status but no historical code status.      Advance Directive Documentation    Flowsheet Row Most Recent Value  Type of Advance Directive Healthcare Power of Attorney, Living will, Out of facility DNR (pink MOST or yellow form)  Pre-existing out of facility DNR order (yellow form or pink MOST form) --  MOST Form in Place? --    Prognosis:  < 6 months  Discharge Planning: Facility with hospice.   Care plan was discussed with  IDT  Thank you for allowing the Palliative Medicine Team to assist in the care of this patient.  Mod MDM.      Greater than 50%  of this time was spent counseling and coordinating care related to the above assessment and plan.  Lonia Serve, MD  Please contact Palliative Medicine Team phone at [phone] for questions and concerns.

## 2024-05-14 NOTE — Plan of Care (Signed)
   Problem: Clinical Measurements: Goal: Will remain free from infection Outcome: Progressing Goal: Diagnostic test results will improve Outcome: Progressing Goal: Respiratory complications will improve Outcome: Progressing Goal: Cardiovascular complication will be avoided Outcome: Progressing

## 2024-05-14 NOTE — Plan of Care (Incomplete)
  Problem: Clinical Measurements: Goal: Diagnostic test results will improve Outcome: Progressing   Problem: Activity: Goal: Risk for activity intolerance will decrease Outcome: Progressing   Problem: Elimination: Goal: Will not experience complications related to bowel motility Outcome: Progressing Goal: Will not experience complications related to urinary retention Outcome: Progressing   Problem: Safety: Goal: Ability to remain free from injury will improve Outcome: Progressing   Problem: Safety: Goal: Non-violent Restraint(s) Outcome: Progressing   Problem: Clinical Measurements: Goal: Will remain free from infection Outcome: Adequate for Discharge Goal: Respiratory complications will improve Outcome: Adequate for Discharge Goal: Cardiovascular complication will be avoided Outcome: Adequate for Discharge   Problem: Nutrition: Goal: Adequate nutrition will be maintained Outcome: Adequate for Discharge   Problem: Pain Managment: Goal: General experience of comfort will improve and/or be controlled Outcome: Adequate for Discharge   Problem: Skin Integrity: Goal: Risk for impaired skin integrity will decrease Outcome: Adequate for Discharge

## 2024-05-14 NOTE — Progress Notes (Signed)
 Mobility Specialist - Progress Note   05/14/24 0939  Mobility  Activity Pivoted/transferred from bed to chair  Level of Assistance Moderate assist, patient does 50-74%  Assistive Device Other (Comment) (HHA)  Distance Ambulated (ft) 3 ft  Range of Motion/Exercises Active  Activity Response Tolerated well  Mobility Referral Yes  Mobility visit 1 Mobility  Mobility Specialist Start Time (ACUTE ONLY) [id number]  Mobility Specialist Stop Time (ACUTE ONLY) [id number]  Mobility Specialist Time Calculation (min) (ACUTE ONLY) 13 min   NT requested assistance to transfer pt to recliner chair. Pt mod-A for for bed mobility and was left on recliner chair with all needs met. Wife and NT in room.   Erminio Leos,  Mobility Specialist Can be reached via Secure Chat

## 2024-05-15 DIAGNOSIS — I160 Hypertensive urgency: Secondary | ICD-10-CM | POA: Diagnosis not present

## 2024-05-15 NOTE — Progress Notes (Signed)
 Occupational Therapy Treatment Patient Details Name: Julian [name] MRN: [id number] DOB: 06-09-1926 Today's Date: 05/15/2024   History of present illness 89 year old with past history significant for Alzheimer dementia, behavioral disturbance, hypertension, hyperlipidemia, hypothyroid who presents with increased agitation.   OT comments  Pt alert this afternoon and able to participate in ADL session in collaboration with nurse tech. Pt able to assist with basic ADLs consistently with cues, mobilizing in room w/ handheld assist and +2 for safety. Pt's wife present and supportive. Noted ongoing discussions for safe disposition plan; recommend 24/7 assist/support at DC venue.      If plan is discharge home, recommend the following:  A little help with walking and/or transfers;Direct supervision/assist for medications management;Supervision due to cognitive status;Direct supervision/assist for financial management;A little help with bathing/dressing/bathroom   Equipment Recommendations  None recommended by OT    Recommendations for Other Services      Precautions / Restrictions Precautions Precautions: Fall Recall of Precautions/Restrictions: Impaired Restrictions Weight Bearing Restrictions Per Provider Order: No       Mobility Bed Mobility Overal bed mobility: Needs Assistance Bed Mobility: Supine to Sit     Supine to sit: Mod assist, HOB elevated, Used rails     General bed mobility comments: assist for BLE fully off EOB, assist to lift trunk    Transfers Overall transfer level: Needs assistance Equipment used: None Transfers: Sit to/from Stand Sit to Stand: Mod assist           General transfer comment: Mod A to stand at bedside, assist to shift weight forwards over feet,.     Balance Overall balance assessment: Needs assistance Sitting-balance support: Feet supported Sitting balance-Leahy Scale: Good   Postural control: Posterior lean Standing balance support:  Bilateral upper extremity supported, During functional activity, Reliant on assistive device for balance Standing balance-Leahy Scale: Poor                             ADL either performed or assessed with clinical judgement   ADL Overall ADL's : Needs assistance/impaired     Grooming: Supervision/safety;Sitting;Wash/dry face;Oral care;Brushing hair Grooming Details (indicate cue type and reason): thoroughness with oral care, responding well to cues for sequencing Upper Body Bathing: Moderate assistance;Sitting   Lower Body Bathing: Maximal assistance;Sit to/from stand   Upper Body Dressing : Moderate assistance;Sitting                   Functional mobility during ADLs: Minimal assistance;Moderate assistance;+2 for safety/equipment General ADL Comments: Emphasis on bathing tasks with NT, mobility in room to recliner to sit OOB for a bit.    Extremity/Trunk Assessment Upper Extremity Assessment Upper Extremity Assessment: Right hand dominant;Overall [facility] for tasks assessed   Lower Extremity Assessment Lower Extremity Assessment: Defer to PT evaluation        Vision   Vision Assessment?: No apparent visual deficits   Perception     Praxis     Communication Communication Communication: Impaired   Cognition Arousal: Alert Behavior During Therapy: Flat affect Cognition: Cognition impaired, History of cognitive impairments   Orientation impairments: Place, Time, Situation Awareness: Intellectual awareness impaired, Online awareness impaired Memory impairment (select all impairments): Short-term memory, Working [employer] fast streamer, Conservation officer, historic buildings Attention impairment (select first level of impairment): Selective attention Executive functioning impairment (select all impairments): Organization, Problem solving OT - Cognition Comments: pleasant, consistently following ADL/basic mobility commands. decreased insight into situation, able to identify spouse in  room and  talk to her briefly                 Following commands: Impaired Following commands impaired: Only follows one step commands consistently, Follows one step commands with increased time      Cueing   Cueing Techniques: Verbal cues, Gestural cues, Tactile cues, Visual cues  Exercises      Shoulder Instructions       General Comments Wife present and supportive    Pertinent Vitals/ Pain       Pain Assessment Pain Assessment: Faces Faces Pain Scale: No hurt Pain Intervention(s): Monitored during session  Home Living                                          Prior Functioning/Environment              Frequency  Min 2X/week        Progress Toward Goals  OT Goals(current goals can now be found in the care plan section)  Progress towards OT goals: Progressing toward goals  Acute Rehab OT Goals OT Goal Formulation: Patient unable to participate in goal setting Time For Goal Achievement: 05/25/24 Potential to Achieve Goals: Good ADL Goals Pt Will Perform Eating: with min assist;sitting Pt Will Perform Grooming: sitting;with min assist Pt Will Perform Upper Body Dressing: with min assist;sitting Additional ADL Goal #1: The pt will perform bed mobility with CGA, in prep for progressive ADL participation.  Plan      Co-evaluation                 AM-PAC OT 6 Clicks Daily Activity     Outcome Measure   Help from another person eating meals?: A Little Help from another person taking care of personal grooming?: A Little Help from another person toileting, which includes using toliet, bedpan, or urinal?: A Little Help from another person bathing (including washing, rinsing, drying)?: A Little Help from another person to put on and taking off regular upper body clothing?: A Little Help from another person to put on and taking off regular lower body clothing?: A Little 6 Click Score: 18    End of Session Equipment Utilized  During Treatment: Gait belt  OT Visit Diagnosis: Other symptoms and signs involving cognitive function;Other abnormalities of gait and mobility (R26.89)   Activity Tolerance Patient tolerated treatment well   Patient Left in chair;with call bell/phone within reach;with chair alarm set;with family/visitor present   Nurse Communication Mobility status        Time: 8769-8742 OT Time Calculation (min): 27 min  Charges: OT General Charges $OT Visit: 1 Visit OT Treatments $Self Care/Home Management : 23-37 mins  Mliss NOVAK, OTR/L Acute Rehab Services Office: [phone]   Mliss Fish 05/15/2024, 1:46 PM

## 2024-05-15 NOTE — Progress Notes (Signed)
 PROGRESS NOTE    Julian [name]  FMW:[id number] DOB: Sep 07, 1925 DOA: 04/25/2024 PCP: Loreli Elsie [name], MD   Brief Narrative:  89 yo male with PMH advanced Alzheimer's dementia with behavioral disturbance, HTN, HLD, hypothyroidism presents with increased agitation.  On presentation, CT angio of chest/abdomen and pelvis was performed: Showed findings concerning for fecal impaction and stercoral colitis.  He also had urinary retention requiring Foley catheter placement in the ED.  Psychiatry consulted for severe agitation: Transition from Seroquel  to Rexulti .  PT recommended SNF placement.  TOC following for placement.  Currently medically stable for discharge.  Assessment & Plan:   Urinary retention BPH - Needed Foley catheter placement on admission.  Continue Flomax .  Foley will remain at discharge: Will need outpatient follow-up with urology  Dementia with behavioral disturbance Acute metabolic encephalopathy Physical deconditioning -Psychiatry following intermittently.  Seroquel  has been transitioned to Rexulti .  Continue lorazepam  and Depakote .  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 -  PT recommended SNF placement.  TOC following for placement.  Currently medically stable for discharge.  Stercoral colitis Constipation - Continue stool softeners and as needed laxatives  Hypertensive urgency -Resolved.  Continue amlodipine  and losartan   Hypothyroidism -Continue levothyroxine   Lung nodules -Will need CT chest in 3 to 6 months    DVT prophylaxis: Lovenox  Code Status: DNR Family Communication: Wife at bedside Disposition Plan: Status is: Inpatient Remains inpatient appropriate because: Of severity of illness.  Need for SNF placement.  Currently medically stable for discharge.    Consultants: Psychiatry/palliative care  Procedures: None  Antimicrobials: None    Subjective: Patient  seen and examined at bedside.  Poor historian.  He required haldol  again last night as per nursing staff.  No seizures, fever or vomiting noted.   Objective: Vitals:   05/14/24 0539 05/14/24 1352 05/14/24 2152 05/15/24 0616  BP: (!) 144/62 111/61 135/63 (!) 173/72  Pulse: (!) 58 70 73 65  Resp: 18 18 18 16   Temp: 97.9 F (36.6 C) 98.2 F (36.8 C) 98.6 F (37 C) 97.6 F (36.4 C)  TempSrc: Oral Oral Oral Oral  SpO2: 98% 95% 95% 97%  Weight:      Height:        Intake/Output Summary (Last 24 hours) at 05/15/2024 0848 Last data filed at 05/14/2024 1614 Gross per 24 hour  Intake 480 ml  Output 300 ml  Net 180 ml   Filed Weights   04/26/24 0230  Weight: 67.5 kg    Examination:  General: No acute distress.  Remains on room air.  Elderly male lying in bed.  ENT/neck: No JVD elevation or palpable neck masses noted  respiratory: Bilateral decreased breath sounds at bases with scattered crackles CVS: S1-S2 heard, rate mostly controlled  abdominal: Soft, nontender, distended mildly; no organomegaly, bowel sounds normally heard  extremities: No clubbing; mild lower extremity edema present  CNS: Sleepy, wakes up slightly, still extremely slow to respond and confused.  No obvious focal deficits noted lymph: No obvious palpable lymphadenopathy Skin: No obvious petechiae/lesions  psych: Extremely flat affect with no signs of agitation.   Musculoskeletal: No obvious joint tenderness/erythema Genitourinary: Foley catheter present    Data Reviewed: I have personally reviewed following labs and imaging studies  CBC: Recent Labs  Lab 05/10/24 0609 05/11/24 0357 05/12/24 0421 05/13/24 0447  WBC 6.3 7.4 7.2 8.3  NEUTROABS 4.3 4.9 5.2 6.1  HGB 11.9* 12.2* 11.1* 11.2*  HCT 37.9* 39.6 36.1*  37.0*  MCV 89.0 89.2 90.0 89.2  PLT 247 283 274 266   Basic Metabolic Panel: Recent Labs  Lab 05/10/24 0609 05/11/24 0357 05/12/24 0421 05/13/24 0447  NA 138 139 136 136  K 4.5 4.5 4.7 4.7   CL 102 102 101 99  CO2 25 25 24 26   GLUCOSE 84 87 88 103*  BUN 25* 24* 31* 26*  CREATININE 0.90 0.74 1.00 0.87  CALCIUM  9.2 9.2 9.2 9.2   GFR: Estimated Creatinine Clearance: 47.4 mL/min (by C-G formula based on SCr of 0.87 mg/dL). Liver Function Tests: No results for input(s): AST, ALT, ALKPHOS, BILITOT, PROT, ALBUMIN in the last 168 hours. No results for input(s): LIPASE, AMYLASE in the last 168 hours. No results for input(s): AMMONIA in the last 168 hours. Coagulation Profile: No results for input(s): INR, PROTIME in the last 168 hours. Cardiac Enzymes: No results for input(s): CKTOTAL, CKMB, CKMBINDEX, TROPONINI in the last 168 hours. BNP (last 3 results) Recent Labs    04/26/24 0407  PROBNP 520.0*   HbA1C: No results for input(s): HGBA1C in the last 72 hours. CBG: Recent Labs  Lab 05/12/24 0646  GLUCAP 98   Lipid Profile: No results for input(s): CHOL, HDL, LDLCALC, TRIG, CHOLHDL, LDLDIRECT in the last 72 hours. Thyroid  Function Tests: No results for input(s): TSH, T4TOTAL, FREET4, T3FREE, THYROIDAB in the last 72 hours. Anemia Panel: No results for input(s): VITAMINB12, FOLATE, FERRITIN, TIBC, IRON, RETICCTPCT in the last 72 hours. Sepsis Labs: No results for input(s): PROCALCITON, LATICACIDVEN in the last 168 hours.  No results found for this or any previous visit (from the past 240 hours).       Radiology Studies: No results found.      Scheduled Meds:  amLODipine   2.5 mg Oral Daily   brexpiprazole   1 mg Oral Daily   Chlorhexidine  Gluconate Cloth  6 each Topical Daily   citalopram   20 mg Oral QHS   divalproex   250 mg Oral Q12H   enoxaparin  (LOVENOX ) injection  40 mg Subcutaneous Q24H   feeding supplement  237 mL Oral BID BM   levothyroxine   75 mcg Oral Q0600   LORazepam   0.5 mg Oral Q1400   losartan   100 mg Oral Daily   senna-docusate  1 tablet Oral BID   sodium chloride   flush  3 mL Intravenous Q12H   tamsulosin   0.4 mg Oral Daily   Continuous Infusions:        Sophie Mao, MD [facility] 05/15/2024, 8:48 AM

## 2024-05-15 NOTE — Plan of Care (Signed)
   Problem: Clinical Measurements: Goal: Will remain free from infection Outcome: Progressing Goal: Diagnostic test results will improve Outcome: Progressing Goal: Respiratory complications will improve Outcome: Progressing Goal: Cardiovascular complication will be avoided Outcome: Progressing

## 2024-05-15 NOTE — Progress Notes (Signed)
 OT Cancellation Note  Patient Details Name: Kerrigan Glendening MRN: [id number] DOB: 1925/11/17   Cancelled Treatment:    Reason Eval/Treat Not Completed: Fatigue/lethargy limiting ability to participate Attempted x 2 this AM though pt lethargic and could not awaken to participate. Will follow up at a later time for OT session.  Mliss Fish 05/15/2024, 10:28 AM

## 2024-05-15 NOTE — TOC Progression Note (Incomplete Revision)
 Transition of Care [facility]) - Progression Note    Patient Details  Name: Julian [name] MRN: [id number] Date of Birth: 03/06/1926  [facility]) CM/SW Contact  [name], LCSW Phone Number: 05/15/2024, 9:52 AM  Clinical Narrative:     CSW received a call from Jenetta at [facility]. She reported that they can accept the pt, but he cannot be admitted with a Foley catheter.   Spoke with [facility] who is still reviewing pt for possible admission. They reported that they are sending pt's information to the administrator to review.   Care management to follow.   Expected Discharge Plan:  (TBD) Barriers to Discharge: Continued Medical Work up               Expected Discharge Plan and Services                                               Social Drivers of Health (SDOH) Interventions SDOH Screenings   Food Insecurity: Patient Unable To Answer (04/26/2024)  Housing: Unknown (04/26/2024)  Transportation Needs: Patient Unable To Answer (04/26/2024)  Utilities: Patient Unable To Answer (04/26/2024)  Social Connections: Patient Unable To Answer (04/26/2024)  Tobacco Use: Medium Risk (04/26/2024)    Readmission Risk Interventions     No data to display

## 2024-05-15 NOTE — Plan of Care (Incomplete)
  Problem: Clinical Measurements: Goal: Diagnostic test results will improve Outcome: Progressing   Problem: Activity: Goal: Risk for activity intolerance will decrease Outcome: Progressing   Problem: Elimination: Goal: Will not experience complications related to bowel motility Outcome: Progressing Goal: Will not experience complications related to urinary retention Outcome: Progressing   Problem: Safety: Goal: Ability to remain free from injury will improve Outcome: Progressing   Problem: Safety: Goal: Non-violent Restraint(s) Outcome: Progressing   Problem: Clinical Measurements: Goal: Will remain free from infection Outcome: Adequate for Discharge Goal: Respiratory complications will improve Outcome: Adequate for Discharge Goal: Cardiovascular complication will be avoided Outcome: Adequate for Discharge   Problem: Nutrition: Goal: Adequate nutrition will be maintained Outcome: Adequate for Discharge   Problem: Pain Managment: Goal: General experience of comfort will improve and/or be controlled Outcome: Adequate for Discharge   Problem: Skin Integrity: Goal: Risk for impaired skin integrity will decrease Outcome: Adequate for Discharge

## 2024-05-15 NOTE — Progress Notes (Signed)
 Mobility Specialist - Progress Note    05/15/24 1500  Mobility  Activity Ambulated with assistance  Level of Assistance Contact guard assist, steadying assist  Assistive Device Other (Comment) (HHA)  Distance Ambulated (ft) 350 ft  Range of Motion/Exercises Active Assistive  Activity Response Tolerated well  Mobility Referral Yes  Mobility visit 1 Mobility  Mobility Specialist Start Time (ACUTE ONLY) 1515  Mobility Specialist Stop Time (ACUTE ONLY) 1527  Mobility Specialist Time Calculation (min) (ACUTE ONLY) 12 min    Pt was received in bathroom and agreed to mobility. HHA during ambulation.  Min A returning back to bed and applied posey belt. All needs met and call bell in reach.  [employer] - Mobility Specialist

## 2024-05-15 NOTE — TOC Progression Note (Signed)
 Transition of Care [facility]) - Progression Note    Patient Details  Name: Julian [name] MRN: [id number] Date of Birth: [birth year]  [facility]) CM/SW Contact  [name], LCSW Phone Number: 05/15/2024, 9:52 AM  Clinical Narrative:     CSW received a call from Jenetta at [facility]. She reported that they can accept the pt, but he cannot be admitted with a Foley catheter. Care management to follow.   Expected Discharge Plan:  (TBD) Barriers to Discharge: Continued Medical Work up               Expected Discharge Plan and Services                                               Social Drivers of Health (SDOH) Interventions SDOH Screenings   Food Insecurity: Patient Unable To Answer (04/26/2024)  Housing: Unknown (04/26/2024)  Transportation Needs: Patient Unable To Answer (04/26/2024)  Utilities: Patient Unable To Answer (04/26/2024)  Social Connections: Patient Unable To Answer (04/26/2024)  Tobacco Use: Medium Risk (04/26/2024)    Readmission Risk Interventions     No data to display

## 2024-05-16 DIAGNOSIS — I160 Hypertensive urgency: Secondary | ICD-10-CM | POA: Diagnosis not present

## 2024-05-16 MED ORDER — AMLODIPINE BESYLATE 5 MG PO TABS
2.5000 mg | ORAL_TABLET | Freq: Once | ORAL | Status: AC
  Administered 2024-05-16: 2.5 mg via ORAL
  Filled 2024-05-16: qty 1

## 2024-05-16 MED ORDER — BREXPIPRAZOLE 1 MG PO TABS
2.0000 mg | ORAL_TABLET | Freq: Every day | ORAL | Status: DC
  Administered 2024-05-17 – 2024-05-21 (×5): 2 mg via ORAL
  Filled 2024-05-16 (×5): qty 2

## 2024-05-16 MED ORDER — AMLODIPINE BESYLATE 10 MG PO TABS
5.0000 mg | ORAL_TABLET | Freq: Every day | ORAL | Status: DC
  Administered 2024-05-17 – 2024-05-21 (×5): 5 mg via ORAL
  Filled 2024-05-16 (×5): qty 1

## 2024-05-16 NOTE — Plan of Care (Signed)
   Problem: Clinical Measurements: Goal: Will remain free from infection Outcome: Progressing Goal: Diagnostic test results will improve Outcome: Progressing Goal: Respiratory complications will improve Outcome: Progressing Goal: Cardiovascular complication will be avoided Outcome: Progressing

## 2024-05-16 NOTE — Progress Notes (Signed)
 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  Patient underwent CT angiogram of the chest, abdomen and pelvis with findings consistent with fecal impaction and stercoral colitis.  Also was noted to have urinary retention with Foley catheter placement.  Psychiatry was consulted for severe agitation; patient was transition from Seroquel  to Rexulti .  Seen by therapy with recommendation of SNF placement.  TOC following, medically stable for discharge once facility found.  Assessment & Plan:   Advanced Alzheimer's dementia with behavioral disturbance Patient presenting from [facility] nursing facility with agitation.  Currently followed by hospice outpatient for his terminal illness.  Patient previously on Seroquel .  Seen by psychiatry and now transition to Rexulti  -- Increase Rexulti  to 2 mg p.o. daily -- Depakote  250 mg p.o. every 12 hours -- Lorazepam  0.5 mg p.o. daily --Celexa  20 mg p.o. nightly -- Haldol  5 mg IV every 6 hours as needed agitation -- Check Depakote  level in a.m.  Urinary retention secondary to BPH Etiology likely multifactorial with BPH and fecal impaction.  Foley catheter was placed in the ED. -- Tamsulosin  0.4 mg p.o. daily -- Bladder scan at next void following Foley catheter removal or if no void in 6 hours -- Monitor urine output  Stercoral colitis secondary to constipation/fecal impaction CT abdomen/pelvis with findings consistent with fecal impaction and stercoral colitis. -- Senokot-S1 tablet p.o. twice daily -- MiraLAX  daily as needed mild/moderate constipation  HTN -- Increase amlodipine  to 5 mg p.o.  daily -- Losartan  100 mg p.o. daily -- Hydralazine  25 mg p.o. every 6 hours as needed SBP >180 or DBP >110  Hypothyroidism -- Levothyroxine  75 mcg p.o. daily  Lung nodules CT angiogram chest with noted multiple pulmonary nodules, most severe with 7 mm left solid pulm nodule upper lobe.  Although patient is currently on hospice due to his terminal illness as above, can consider repeat noncontrast CT chest in 3-6 months depending on patient/family goals of care at that time.   DVT prophylaxis: enoxaparin  (LOVENOX ) injection 40 mg Start: 04/26/24 1000    Code Status: Limited: Do not attempt resuscitation (DNR) -DNR-LIMITED -Do Not Intubate/DNI  Family Communication: Updated spouse present at bedside this morning  Disposition Plan:  Level of care: Med-Surg Status is: Inpatient Remains inpatient appropriate because: Pending placement    Consultants:  Psychiatry  Procedures:  None  Antimicrobials:  None   Subjective: Patient seen examined bedside, sitting in bedside chair eating breakfast.  Pleasantly confused.  Spouse present at bedside.  Awaiting placement.  Adjusting antihypertensives, increasing Rexulti  due to continued agitation throughout the day.  Discussed with RN.  Also will attempt voiding trial today.  Unable to obtain any further ROS from patient given his underlying dementia.  Objective: Vitals:   05/15/24 1355 05/15/24 2040 05/16/24 0440 05/16/24 1453  BP: 133/62 (!) 178/71 (!) 150/69 137/62  Pulse: 72 81 61 69  Resp: 16 16 15 16   Temp: 98 F (36.7 C) 98.1 F (36.7 C) 97.9 F (36.6 C) 98.4 F (36.9 C)  TempSrc: Oral Oral Oral Oral  SpO2: 99% 93% 97% 98%  Weight:      Height:  Intake/Output Summary (Last 24 hours) at 05/16/2024 1608 Last data filed at 05/16/2024 1423 Gross per 24 hour  Intake 1078 ml  Output 2400 ml  Net -1322 ml   Filed Weights   04/26/24 0230  Weight: 67.5 kg    Examination:  Physical Exam: GEN: NAD, alert, pleasantly  confused HEENT: NCAT, sclera clear, MMM PULM: CTAB w/o wheezes/crackles, normal respiratory effort, on room air CV: RRR w/o M/G/R GI: abd soft, NTND, + BS MSK: no peripheral edema, moves all extremity dependently PSYCH: normal mood/affect    Data Reviewed: I have personally reviewed following labs and imaging studies  CBC: Recent Labs  Lab 05/10/24 0609 05/11/24 0357 05/12/24 0421 05/13/24 0447  WBC 6.3 7.4 7.2 8.3  NEUTROABS 4.3 4.9 5.2 6.1  HGB 11.9* 12.2* 11.1* 11.2*  HCT 37.9* 39.6 36.1* 37.0*  MCV 89.0 89.2 90.0 89.2  PLT 247 283 274 266   Basic Metabolic Panel: Recent Labs  Lab 05/10/24 0609 05/11/24 0357 05/12/24 0421 05/13/24 0447  NA 138 139 136 136  K 4.5 4.5 4.7 4.7  CL 102 102 101 99  CO2 25 25 24 26   GLUCOSE 84 87 88 103*  BUN 25* 24* 31* 26*  CREATININE 0.90 0.74 1.00 0.87  CALCIUM  9.2 9.2 9.2 9.2   GFR: Estimated Creatinine Clearance: 47.4 mL/min (by C-G formula based on SCr of 0.87 mg/dL). Liver Function Tests: No results for input(s): AST, ALT, ALKPHOS, BILITOT, PROT, ALBUMIN in the last 168 hours. No results for input(s): LIPASE, AMYLASE in the last 168 hours. No results for input(s): AMMONIA in the last 168 hours. Coagulation Profile: No results for input(s): INR, PROTIME in the last 168 hours. Cardiac Enzymes: No results for input(s): CKTOTAL, CKMB, CKMBINDEX, TROPONINI in the last 168 hours. BNP (last 3 results) Recent Labs    04/26/24 0407  PROBNP 520.0*   HbA1C: No results for input(s): HGBA1C in the last 72 hours. CBG: Recent Labs  Lab 05/12/24 0646  GLUCAP 98   Lipid Profile: No results for input(s): CHOL, HDL, LDLCALC, TRIG, CHOLHDL, LDLDIRECT in the last 72 hours. Thyroid  Function Tests: No results for input(s): TSH, T4TOTAL, FREET4, T3FREE, THYROIDAB in the last 72 hours. Anemia Panel: No results for input(s): VITAMINB12, FOLATE, FERRITIN, TIBC, IRON,  RETICCTPCT in the last 72 hours. Sepsis Labs: No results for input(s): PROCALCITON, LATICACIDVEN in the last 168 hours.  No results found for this or any previous visit (from the past 240 hours).       Radiology Studies: No results found.      Scheduled Meds:  [START ON 05/17/2024] amLODipine   5 mg Oral Daily   brexpiprazole   1 mg Oral Daily   Chlorhexidine  Gluconate Cloth  6 each Topical Daily   citalopram   20 mg Oral QHS   divalproex   250 mg Oral Q12H   enoxaparin  (LOVENOX ) injection  40 mg Subcutaneous Q24H   feeding supplement  237 mL Oral BID BM   levothyroxine   75 mcg Oral Q0600   LORazepam   0.5 mg Oral Q1400   losartan   100 mg Oral Daily   senna-docusate  1 tablet Oral BID   sodium chloride  flush  3 mL Intravenous Q12H   tamsulosin   0.4 mg Oral Daily   Continuous Infusions:   LOS: 17 days    Time spent: 52 minutes spent on 05/16/2024 caring for this patient face-to-face including chart review, ordering labs/tests, documenting, discussion with nursing staff, consultants, updating family and interview/physical exam    Camellia PARAS  [place], DO [facility] Available via Epic secure chat 7am-7pm After these hours, please refer to coverage provider listed on [email or web address] 05/16/2024, 4:08 PM

## 2024-05-16 NOTE — Progress Notes (Signed)
 Physical Therapy Treatment Patient Details Name: Julian [name] MRN: [id number] DOB: 09/21/1925 Today's Date: 05/16/2024   History of Present Illness 89 year old who presents with increased agitation. Dx of colitis, urinary retention. Pt with past history significant for Alzheimer dementia, behavioral disturbance, hypertension, hyperlipidemia, hypothyroid.    PT Comments  Pt is progressing well with mobility. He ambulated 160' with RW with min assist to maneuver RW during turns, no loss of balance. Assisted pt to bathroom where he attempted to have a BM but was not successful. Pt requires assistance to don shoes and for pericare. Wife present during treatment, she stated she's not able to find a memory care facility that will accept pt since he has a foley.    If plan is discharge home, recommend the following: A lot of help with walking and/or transfers;A lot of help with bathing/dressing/bathroom;Assistance with cooking/housework;Assist for transportation;Help with stairs or ramp for entrance;Direct supervision/assist for financial management;Direct supervision/assist for medications management   Can travel by private vehicle     Yes  Equipment Recommendations  None recommended by PT    Recommendations for Other Services       Precautions / Restrictions Precautions Precautions: Fall Recall of Precautions/Restrictions: Impaired Restrictions Weight Bearing Restrictions Per Provider Order: No     Mobility  Bed Mobility Overal bed mobility: Needs Assistance Bed Mobility: Supine to Sit     Supine to sit: HOB elevated, Used rails, Min assist     General bed mobility comments: assist for BLE fully off EOB, assist to lift trunk    Transfers Overall transfer level: Needs assistance Equipment used: Rolling walker (2 wheels) Transfers: Sit to/from Stand Sit to Stand: Mod assist           General transfer comment: Mod A to stand from low commode, min A from elevated bed; verbal  cues and manual assist for safe hand placement    Ambulation/Gait Ambulation/Gait assistance: Min assist Gait Distance (Feet): 160 Feet Assistive device: Rolling walker (2 wheels) Gait Pattern/deviations: Step-through pattern, Decreased stride length, Trunk flexed       General Gait Details: min A to maneuver RW with turns, no loss of balance   Stairs             Wheelchair Mobility     Tilt Bed    Modified Rankin (Stroke Patients Only)       Balance Overall balance assessment: Needs assistance Sitting-balance support: Feet supported, No upper extremity supported Sitting balance-Leahy Scale: Good     Standing balance support: Bilateral upper extremity supported, During functional activity, Reliant on assistive device for balance Standing balance-Leahy Scale: Fair                              Hotel manager: Impaired  Cognition Arousal: Alert Behavior During Therapy: Flat affect   PT - Cognitive impairments: History of cognitive impairments, Orientation   Orientation impairments: Place, Time, Situation                   PT - Cognition Comments: Hx Alzheimers/dementia/Sundowning. Pleasant. Oriented to self only Following commands: Impaired Following commands impaired: Only follows one step commands consistently, Follows one step commands with increased time    Cueing Cueing Techniques: Verbal cues, Gestural cues, Tactile cues, Visual cues  Exercises      General Comments        Pertinent Vitals/Pain Pain Assessment Faces Pain Scale: No hurt  Home Living                          Prior Function            PT Goals (current goals can now be found in the care plan section) Acute Rehab PT Goals PT Goal Formulation: With family Time For Goal Achievement: 05/30/24 Potential to Achieve Goals: Fair Progress towards PT goals: Progressing toward goals    Frequency    Min 2X/week       PT Plan      Co-evaluation              AM-PAC PT 6 Clicks Mobility   Outcome Measure  Help needed turning from your back to your side while in a flat bed without using bedrails?: A Little Help needed moving from lying on your back to sitting on the side of a flat bed without using bedrails?: A Lot Help needed moving to and from a bed to a chair (including a wheelchair)?: A Little Help needed standing up from a chair using your arms (e.g., wheelchair or bedside chair)?: A Lot Help needed to walk in hospital room?: A Little Help needed climbing 3-5 steps with a railing? : A Lot 6 Click Score: 15    End of Session Equipment Utilized During Treatment: Gait belt Activity Tolerance: Patient tolerated treatment well Patient left: in chair;with chair alarm set;with call bell/phone within reach;with family/visitor present Nurse Communication: Mobility status PT Visit Diagnosis: Other abnormalities of gait and mobility (R26.89);Difficulty in walking, not elsewhere classified (R26.2)     Time: 9084-9053 PT Time Calculation (min) (ACUTE ONLY): 31 min  Charges:    $Gait Training: 8-22 mins $Therapeutic Activity: 8-22 mins PT General Charges $$ ACUTE PT VISIT: 1 Visit                     Sylvan Delon Copp PT 05/16/2024  Acute Rehabilitation Services  Office [phone]

## 2024-05-16 NOTE — Progress Notes (Signed)
 Mobility Specialist - Progress Note   05/16/24 1423  Mobility  Activity Ambulated with assistance  Level of Assistance Minimal assist, patient does 75% or more  Assistive Device Other (Comment) (HHA)  Distance Ambulated (ft) 10 ft  Range of Motion/Exercises Active  Activity Response Tolerated well  Mobility Referral Yes  Mobility visit 1 Mobility  Mobility Specialist Start Time (ACUTE ONLY) 1413  Mobility Specialist Stop Time (ACUTE ONLY) 1423  Mobility Specialist Time Calculation (min) (ACUTE ONLY) 10 min   Pt found trying to get out of bed. Pt requested to use bathroom. Assisted to bathroom and was left with RN in room.   Erminio Leos,  Mobility Specialist Can be reached via Secure Chat

## 2024-05-17 DIAGNOSIS — I160 Hypertensive urgency: Secondary | ICD-10-CM | POA: Diagnosis not present

## 2024-05-17 LAB — COMPREHENSIVE METABOLIC PANEL WITH GFR
ALT: 22 U/L (ref 0–44)
AST: 15 U/L (ref 15–41)
Albumin: 3.5 g/dL (ref 3.5–5.0)
Alkaline Phosphatase: 77 U/L (ref 38–126)
Anion gap: 9 (ref 5–15)
BUN: 21 mg/dL (ref 8–23)
CO2: 25 mmol/L (ref 22–32)
Calcium: 9.3 mg/dL (ref 8.9–10.3)
Chloride: 100 mmol/L (ref 98–111)
Creatinine, Ser: 0.81 mg/dL (ref 0.61–1.24)
GFR, Estimated: >60 mL/min (ref >60)
Glucose, Bld: 87 mg/dL (ref 70–99)
Potassium: 4.5 mmol/L (ref 3.5–5.1)
Sodium: 134 mmol/L — ABNORMAL LOW (ref 135–145)
Total Bilirubin: 0.3 mg/dL (ref 0.0–1.2)
Total Protein: 6.3 g/dL — ABNORMAL LOW (ref 6.5–8.1)

## 2024-05-17 LAB — VALPROIC ACID LEVEL: Valproic Acid Lvl: 23 ug/mL — ABNORMAL LOW (ref 50–100)

## 2024-05-17 MED ORDER — TAMSULOSIN HCL 0.4 MG PO CAPS
0.4000 mg | ORAL_CAPSULE | Freq: Two times a day (BID) | ORAL | Status: DC
  Administered 2024-05-17 – 2024-05-21 (×8): 0.4 mg via ORAL
  Filled 2024-05-17 (×8): qty 1

## 2024-05-17 MED ORDER — POLYVINYL ALCOHOL 1.4 % OP SOLN: 1.0000 [drp] | OPHTHALMIC | Status: DC | PRN

## 2024-05-17 MED ORDER — TUBERCULIN PPD 5 UNIT/0.1ML ID SOLN
5.0000 [IU] | Freq: Once | INTRADERMAL | Status: AC
  Administered 2024-05-17: 5 [IU] via INTRADERMAL
  Filled 2024-05-17: qty 0.1

## 2024-05-17 NOTE — Plan of Care (Signed)
°  Problem: Clinical Measurements: Goal: Will remain free from infection Outcome: Progressing   Problem: Activity: Goal: Risk for activity intolerance will decrease Outcome: Progressing   Problem: Nutrition: Goal: Adequate nutrition will be maintained Outcome: Progressing

## 2024-05-17 NOTE — Progress Notes (Signed)
 Mobility Specialist - Progress Note   05/17/24 1000  Mobility  Activity Ambulated with assistance  Level of Assistance Minimal assist, patient does 75% or more  Assistive Device Other (Comment) (HHA)  Distance Ambulated (ft) 10 ft  Range of Motion/Exercises Active  Activity Response Tolerated well  Mobility Referral Yes  Mobility visit 1 Mobility  Mobility Specialist Start Time (ACUTE ONLY) 0950  Mobility Specialist Stop Time (ACUTE ONLY) 1000  Mobility Specialist Time Calculation (min) (ACUTE ONLY) 10 min   Pt requested assistance to bathroom. Min-A for bed mobility to sit EOB. At EOS was left in bathroom with NT  and wife in room.   Erminio Leos,  Mobility Specialist Can be reached via Secure Chat

## 2024-05-17 NOTE — Plan of Care (Signed)
  Problem: Clinical Measurements: Goal: Will remain free from infection Outcome: Progressing Goal: Diagnostic test results will improve Outcome: Progressing Goal: Respiratory complications will improve Outcome: Progressing Goal: Cardiovascular complication will be avoided Outcome: Progressing   Problem: Nutrition: Goal: Adequate nutrition will be maintained Outcome: Progressing   Problem: Elimination: Goal: Will not experience complications related to bowel motility Outcome: Progressing Goal: Will not experience complications related to urinary retention Outcome: Progressing   Problem: Pain Managment: Goal: General experience of comfort will improve and/or be controlled Outcome: Progressing   Problem: Safety: Goal: Ability to remain free from injury will improve Outcome: Progressing   Problem: Skin Integrity: Goal: Risk for impaired skin integrity will decrease Outcome: Progressing   Problem: Safety: Goal: Non-violent Restraint(s) Outcome: Progressing

## 2024-05-17 NOTE — Progress Notes (Addendum)
 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  Patient underwent CT angiogram of the chest, abdomen and pelvis with findings consistent with fecal impaction and stercoral colitis.  Also was noted to have urinary retention with Foley catheter placement.  Psychiatry was consulted for severe agitation; patient was transition from Seroquel  to Rexulti .  Seen by therapy with recommendation of SNF placement.  TOC following, medically stable for discharge once facility found.  Assessment & Plan:   Advanced Alzheimer's dementia with behavioral disturbance Patient presenting from [facility] nursing facility with agitation.  Currently followed by hospice outpatient for his terminal illness.  Patient previously on Seroquel .  Seen by psychiatry and now transition to Rexulti  -- Rexulti  2 mg p.o. daily -- Depakote  250 mg p.o. every 12 hours (Depakote  level 23 (low) on 9/25) -- Lorazepam  0.5 mg p.o. daily -- Celexa  20 mg p.o. nightly -- Haldol  5 mg IV every 6 hours as needed agitation  Urinary retention secondary to BPH Etiology likely multifactorial with BPH and fecal impaction.  Foley catheter was placed in the ED. -- Foley catheter removed on 9/24 -- Tamsulosin  0.4 mg p.o. daily -- Bladder scan as needed -- Monitor urine output  Stercoral colitis secondary to constipation/fecal impaction CT abdomen/pelvis with findings consistent with fecal impaction and stercoral colitis. -- Senokot-S1 tablet p.o. twice daily -- MiraLAX  daily as needed mild/moderate constipation  HTN -- Amlodipine  5 mg p.o. daily -- Losartan  100 mg p.o. daily --  Hydralazine  25 mg p.o. every 6 hours as needed SBP >180 or DBP >110  Hypothyroidism -- Levothyroxine  75 mcg p.o. daily  Lung nodules CT angiogram chest with noted multiple pulmonary nodules, most severe with 7 mm left solid pulm nodule upper lobe.  Although patient is currently on hospice due to his terminal illness as above, can consider repeat noncontrast CT chest in 3-6 months depending on patient/family goals of care at that time.  TB skin test ordered per social work request for placement.  Test results to be read and documented within 48-72 hours after injection   DVT prophylaxis: enoxaparin  (LOVENOX ) injection 40 mg Start: 04/26/24 1000    Code Status: Limited: Do not attempt resuscitation (DNR) -DNR-LIMITED -Do Not Intubate/DNI  Family Communication: Updated spouse present at bedside this morning  Disposition Plan:  Level of care: Med-Surg Status is: Inpatient Remains inpatient appropriate because: Pending placement    Consultants:  Psychiatry  Procedures:  None  Antimicrobials:  None   Subjective: Patient seen examined bedside, lying in bed, sleeping but easily arousable.  Remains pleasantly confused.  Spouse updated at bedside this morning.  Foley catheter removed yesterday, with no reported urinary retention.  Discussed with RN today to continue to monitor urine output and bladder scan.  Updated social work, hopefully placement will be available now that Foley catheter has been removed.  Unable to obtain any further ROS from patient given his underlying dementia.  Objective: Vitals:   05/16/24 0440 05/16/24 1453 05/16/24 2152 05/17/24 0600  BP: (!) 150/69 137/62 (!) 163/65 (!) 140/69  Pulse: 61 69 70 65  Resp: 15 16 (!) 22 18  Temp: 97.9 F (36.6 C) 98.4 F (36.9  C) 98.2 F (36.8 C) 97.9 F (36.6 C)  TempSrc: Oral Oral Oral Oral  SpO2: 97% 98% 95% 100%  Weight:      Height:        Intake/Output Summary (Last 24 hours) at 05/17/2024 1046 Last data filed  at 05/17/2024 0800 Gross per 24 hour  Intake 728 ml  Output --  Net 728 ml   Filed Weights   04/26/24 0230  Weight: 67.5 kg    Examination:  Physical Exam: GEN: NAD, alert, pleasantly confused HEENT: NCAT, sclera clear, MMM PULM: CTAB w/o wheezes/crackles, normal respiratory effort, on room air CV: RRR w/o M/G/R GI: abd soft, NTND, + BS MSK: no peripheral edema, moves all extremity dependently PSYCH: normal mood/affect    Data Reviewed: I have personally reviewed following labs and imaging studies  CBC: Recent Labs  Lab 05/11/24 0357 05/12/24 0421 05/13/24 0447  WBC 7.4 7.2 8.3  NEUTROABS 4.9 5.2 6.1  HGB 12.2* 11.1* 11.2*  HCT 39.6 36.1* 37.0*  MCV 89.2 90.0 89.2  PLT 283 274 266   Basic Metabolic Panel: Recent Labs  Lab 05/11/24 0357 05/12/24 0421 05/13/24 0447 05/17/24 0405  NA 139 136 136 134*  K 4.5 4.7 4.7 4.5  CL 102 101 99 100  CO2 25 24 26 25   GLUCOSE 87 88 103* 87  BUN 24* 31* 26* 21  CREATININE 0.74 1.00 0.87 0.81  CALCIUM  9.2 9.2 9.2 9.3   GFR: Estimated Creatinine Clearance: 50.9 mL/min (by C-G formula based on SCr of 0.81 mg/dL). Liver Function Tests: Recent Labs  Lab 05/17/24 0405  AST 15  ALT 22  ALKPHOS 77  BILITOT 0.3  PROT 6.3*  ALBUMIN 3.5   No results for input(s): LIPASE, AMYLASE in the last 168 hours. No results for input(s): AMMONIA in the last 168 hours. Coagulation Profile: No results for input(s): INR, PROTIME in the last 168 hours. Cardiac Enzymes: No results for input(s): CKTOTAL, CKMB, CKMBINDEX, TROPONINI in the last 168 hours. BNP (last 3 results) Recent Labs    04/26/24 0407  PROBNP 520.0*   HbA1C: No results for input(s): HGBA1C in the last 72 hours. CBG: Recent Labs  Lab 05/12/24 0646  GLUCAP 98   Lipid Profile: No results for input(s): CHOL, HDL, LDLCALC, TRIG, CHOLHDL, LDLDIRECT in the last 72 hours. Thyroid  Function Tests: No results for input(s): TSH,  T4TOTAL, FREET4, T3FREE, THYROIDAB in the last 72 hours. Anemia Panel: No results for input(s): VITAMINB12, FOLATE, FERRITIN, TIBC, IRON, RETICCTPCT in the last 72 hours. Sepsis Labs: No results for input(s): PROCALCITON, LATICACIDVEN in the last 168 hours.  No results found for this or any previous visit (from the past 240 hours).       Radiology Studies: No results found.      Scheduled Meds:  amLODipine   5 mg Oral Daily   brexpiprazole   2 mg Oral Daily   Chlorhexidine  Gluconate Cloth  6 each Topical Daily   citalopram   20 mg Oral QHS   divalproex   250 mg Oral Q12H   enoxaparin  (LOVENOX ) injection  40 mg Subcutaneous Q24H   feeding supplement  237 mL Oral BID BM   levothyroxine   75 mcg Oral Q0600   LORazepam   0.5 mg Oral Q1400   losartan   100 mg Oral Daily   senna-docusate  1 tablet Oral BID   sodium chloride  flush  3 mL Intravenous Q12H   tamsulosin   0.4 mg Oral Daily   Continuous Infusions:   LOS: 18 days  Time spent: 48 minutes spent on 05/17/2024 caring for this patient face-to-face including chart review, ordering labs/tests, documenting, discussion with nursing staff, consultants, updating family and interview/physical exam    Camellia PARAS [place], DO [facility] Available via Epic secure chat 7am-7pm After these hours, please refer to coverage provider listed on [email or web address] 05/17/2024, 10:46 AM

## 2024-05-17 NOTE — TOC Progression Note (Addendum)
 Transition of Care [facility]) - Progression Note    Patient Details  Name: Julian [name] MRN: [id number] Date of Birth: 1926/08/21  [facility]) CM/SW Contact  [name], LCSW Phone Number: 05/17/2024, 10:55 AM  Clinical Narrative:     CSW spoke with Gaynell with [facility]. She stated they will be sending staff out to the hospital to review for possible placement. Gaynell stated the pt will need TB skin test done before he can be admitted to facility. CSW spoke with pt's wife she is agreeable to review. CSW assisted pt's wife with Mychart proxy form. Care management to follow.   Adden  2:40pm CSW received POA paperwork and Authorizing Proxy form from pt's spouse. CSW placed copies on pt's chart. CSW has sent Authorizing proxy form to medical records. Care management to follow.   Expected Discharge Plan: Memory Care Barriers to Discharge: ED Facility/Family Refusing to Allow Patient to Return               Expected Discharge Plan and Services                                               Social Drivers of Health (SDOH) Interventions SDOH Screenings   Food Insecurity: Patient Unable To Answer (04/26/2024)  Housing: Unknown (04/26/2024)  Transportation Needs: Patient Unable To Answer (04/26/2024)  Utilities: Patient Unable To Answer (04/26/2024)  Social Connections: Patient Unable To Answer (04/26/2024)  Tobacco Use: Medium Risk (04/26/2024)    Readmission Risk Interventions     No data to display

## 2024-05-17 NOTE — Progress Notes (Signed)
 TB skin test administered this morning, left forearm, site circled. Family provided VIS and educated. Will be read in 48-72 hours.

## 2024-05-18 DIAGNOSIS — I160 Hypertensive urgency: Secondary | ICD-10-CM | POA: Diagnosis not present

## 2024-05-18 NOTE — TOC Progression Note (Signed)
 Transition of Care [facility]) - Progression Note    Patient Details  Name: Julian [name] MRN: [id number] Date of Birth: 1926/08/15  Transition of Care [facility]) CM/SW Contact  [name], LCSW Phone Number: 05/18/2024, 9:34 AM  Clinical Narrative:     CSW spoke with [name] at [facility] regarding pt placement. [name] confirmed acceptance of the pt into the facility. She reported prior communication with the pt's wife, who plans to obtain assistance with moving the pt's belongings into the room.  CSW also spoke directly with the pt's wife, who agreed to a referral to [facility] for Home Hospice services to resume. Referral was made to [name], hospice liaison with [facility].  The FL2 form was faxed to the facility. TB test results will be required and must be faxed to the facility prior to discharge.. Care management to follow.     Expected Discharge Plan: Memory Care Barriers to Discharge: ED Facility/Family Refusing to Allow Patient to Return               Expected Discharge Plan and Services                                               Social Drivers of Health (SDOH) Interventions SDOH Screenings   Food Insecurity: Patient Unable To Answer (04/26/2024)  Housing: Unknown (04/26/2024)  Transportation Needs: Patient Unable To Answer (04/26/2024)  Utilities: Patient Unable To Answer (04/26/2024)  Social Connections: Patient Unable To Answer (04/26/2024)  Tobacco Use: Medium Risk (04/26/2024)    Readmission Risk Interventions     No data to display

## 2024-05-18 NOTE — Progress Notes (Signed)
 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  Patient underwent CT angiogram of the chest, abdomen and pelvis with findings consistent with fecal impaction and stercoral colitis.  Also was noted to have urinary retention with Foley catheter placement.  Psychiatry was consulted for severe agitation; patient was transition from Seroquel  to Rexulti .  Seen by therapy with recommendation of SNF placement.  TOC following, medically stable for discharge once facility found.  Assessment & Plan:   Advanced Alzheimer's dementia with behavioral disturbance Patient presenting from [facility] nursing facility with agitation.  Currently followed by hospice outpatient for his terminal illness.  Patient previously on Seroquel .  Seen by psychiatry and now transition to Rexulti  -- Rexulti  2 mg p.o. daily -- Depakote  250 mg p.o. every 12 hours (Depakote  level 23 (low) on 9/25) -- Lorazepam  0.5 mg p.o. daily -- Celexa  20 mg p.o. nightly -- Haldol  5 mg IV every 6 hours as needed agitation  Urinary retention secondary to BPH Etiology likely multifactorial with BPH and fecal impaction.  Foley catheter was placed in the ED. -- Foley catheter removed on 9/24 -- Tamsulosin  0.4 mg p.o. BID -- Bladder scan as needed -- Monitor urine output  Stercoral colitis secondary to constipation/fecal impaction CT abdomen/pelvis with findings consistent with fecal impaction and stercoral colitis. -- Senokot-S1 tablet p.o. twice daily -- MiraLAX  daily as needed mild/moderate constipation  HTN -- Amlodipine  5 mg p.o. daily -- Losartan  100 mg p.o. daily -- Hydralazine   25 mg p.o. every 6 hours as needed SBP >180 or DBP >110  Hypothyroidism -- Levothyroxine  75 mcg p.o. daily  Lung nodules CT angiogram chest with noted multiple pulmonary nodules, most severe with 7 mm left solid pulm nodule upper lobe.  Although patient is currently on hospice due to his terminal illness as above, can consider repeat noncontrast CT chest in 3-6 months depending on patient/family goals of care at that time.  TB skin test ordered per social work request for placement.  Test results to be read and documented within 48-72 hours after injection on 9/27 -9/28)   DVT prophylaxis: enoxaparin  (LOVENOX ) injection 40 mg Start: 04/26/24 1000    Code Status: Limited: Do not attempt resuscitation (DNR) -DNR-LIMITED -Do Not Intubate/DNI  Family Communication: Updated spouse present at bedside this morning  Disposition Plan:  Level of care: Med-Surg Status is: Inpatient Remains inpatient appropriate because: Pending placement; anticipate [facility] on Monday    Consultants:  Psychiatry  Procedures:  None  Antimicrobials:  None   Subjective: Patient seen examined bedside, sitting in bedside chair, pleasantly confused.  Just finished eating breakfast.  Spouse present at bedside.  Has been accepted at [facility], awaiting TB test results.  Discussed anticipated discharge on Monday.   Unable to obtain any further ROS from patient given his underlying dementia.  No acute concerns overnight per nursing staff.  Objective: Vitals:   05/17/24 0600 05/17/24 1359 05/17/24 2100 05/18/24 0607  BP: (!) 140/69 (!) 86/48 137/62 (!) 127/56  Pulse: 65 (!) 57 72 69  Resp: 18 16 16 16   Temp: 97.9 F (36.6 C) 98.5 F (36.9 C) 98.1 F (36.7 C) 98.2 F (36.8  C)  TempSrc: Oral Oral Oral Oral  SpO2: 100% 100% 99% 94%  Weight:      Height:        Intake/Output Summary (Last 24 hours) at 05/18/2024 1308 Last data filed at 05/18/2024 0900 Gross per 24 hour  Intake 720 ml  Output 350  ml  Net 370 ml   Filed Weights   04/26/24 0230  Weight: 67.5 kg    Examination:  Physical Exam: GEN: NAD, alert, pleasantly confused HEENT: NCAT, sclera clear, MMM PULM: CTAB w/o wheezes/crackles, normal respiratory effort, on room air CV: RRR w/o M/G/R GI: abd soft, NTND, + BS MSK: no peripheral edema, moves all extremity dependently PSYCH: normal mood/affect    Data Reviewed: I have personally reviewed following labs and imaging studies  CBC: Recent Labs  Lab 05/12/24 0421 05/13/24 0447  WBC 7.2 8.3  NEUTROABS 5.2 6.1  HGB 11.1* 11.2*  HCT 36.1* 37.0*  MCV 90.0 89.2  PLT 274 266   Basic Metabolic Panel: Recent Labs  Lab 05/12/24 0421 05/13/24 0447 05/17/24 0405  NA 136 136 134*  K 4.7 4.7 4.5  CL 101 99 100  CO2 24 26 25   GLUCOSE 88 103* 87  BUN 31* 26* 21  CREATININE 1.00 0.87 0.81  CALCIUM  9.2 9.2 9.3   GFR: Estimated Creatinine Clearance: 50.9 mL/min (by C-G formula based on SCr of 0.81 mg/dL). Liver Function Tests: Recent Labs  Lab 05/17/24 0405  AST 15  ALT 22  ALKPHOS 77  BILITOT 0.3  PROT 6.3*  ALBUMIN 3.5   No results for input(s): LIPASE, AMYLASE in the last 168 hours. No results for input(s): AMMONIA in the last 168 hours. Coagulation Profile: No results for input(s): INR, PROTIME in the last 168 hours. Cardiac Enzymes: No results for input(s): CKTOTAL, CKMB, CKMBINDEX, TROPONINI in the last 168 hours. BNP (last 3 results) Recent Labs    04/26/24 0407  PROBNP 520.0*   HbA1C: No results for input(s): HGBA1C in the last 72 hours. CBG: Recent Labs  Lab 05/12/24 0646  GLUCAP 98   Lipid Profile: No results for input(s): CHOL, HDL, LDLCALC, TRIG, CHOLHDL, LDLDIRECT in the last 72 hours. Thyroid  Function Tests: No results for input(s): TSH, T4TOTAL, FREET4, T3FREE, THYROIDAB in the last 72 hours. Anemia Panel: No results for input(s): VITAMINB12, FOLATE, FERRITIN, TIBC,  IRON, RETICCTPCT in the last 72 hours. Sepsis Labs: No results for input(s): PROCALCITON, LATICACIDVEN in the last 168 hours.  No results found for this or any previous visit (from the past 240 hours).       Radiology Studies: No results found.      Scheduled Meds:  amLODipine   5 mg Oral Daily   brexpiprazole   2 mg Oral Daily   Chlorhexidine  Gluconate Cloth  6 each Topical Daily   citalopram   20 mg Oral QHS   divalproex   250 mg Oral Q12H   enoxaparin  (LOVENOX ) injection  40 mg Subcutaneous Q24H   feeding supplement  237 mL Oral BID BM   levothyroxine   75 mcg Oral Q0600   LORazepam   0.5 mg Oral Q1400   losartan   100 mg Oral Daily   senna-docusate  1 tablet Oral BID   sodium chloride  flush  3 mL Intravenous Q12H   tamsulosin   0.4 mg Oral BID   tuberculin  5 Units Intradermal Once   Continuous Infusions:   LOS: 19 days    Time spent: 48 minutes spent on 05/18/2024 caring for this patient face-to-face including chart review,  ordering labs/tests, documenting, discussion with nursing staff, consultants, updating family and interview/physical exam    Camellia PARAS [place], DO [facility] Available via Epic secure chat 7am-7pm After these hours, please refer to coverage provider listed on [email or web address] 05/18/2024, 1:08 PM

## 2024-05-18 NOTE — Plan of Care (Signed)
  Problem: Clinical Measurements: Goal: Will remain free from infection Outcome: Progressing Goal: Diagnostic test results will improve Outcome: Progressing Goal: Respiratory complications will improve Outcome: Progressing Goal: Cardiovascular complication will be avoided Outcome: Progressing   Problem: Activity: Goal: Risk for activity intolerance will decrease Outcome: Progressing   Problem: Nutrition: Goal: Adequate nutrition will be maintained Outcome: Progressing   Problem: Elimination: Goal: Will not experience complications related to bowel motility Outcome: Progressing   Problem: Pain Managment: Goal: General experience of comfort will improve and/or be controlled Outcome: Progressing   Problem: Safety: Goal: Ability to remain free from injury will improve Outcome: Progressing

## 2024-05-19 DIAGNOSIS — I160 Hypertensive urgency: Secondary | ICD-10-CM | POA: Diagnosis not present

## 2024-05-19 MED ORDER — BISACODYL 10 MG RE SUPP: 10.0000 mg | Freq: Every day | RECTAL | Status: DC | PRN

## 2024-05-19 NOTE — Plan of Care (Signed)
  Problem: Clinical Measurements: Goal: Will remain free from infection Outcome: Progressing Goal: Diagnostic test results will improve Outcome: Progressing Goal: Respiratory complications will improve Outcome: Progressing Goal: Cardiovascular complication will be avoided Outcome: Progressing   Problem: Activity: Goal: Risk for activity intolerance will decrease Outcome: Progressing   Problem: Nutrition: Goal: Adequate nutrition will be maintained Outcome: Progressing   Problem: Elimination: Goal: Will not experience complications related to bowel motility Outcome: Progressing Goal: Will not experience complications related to urinary retention Outcome: Progressing   Problem: Pain Managment: Goal: General experience of comfort will improve and/or be controlled Outcome: Progressing   Problem: Skin Integrity: Goal: Risk for impaired skin integrity will decrease Outcome: Progressing   Problem: Safety: Goal: Non-violent Restraint(s) Outcome: Progressing   Problem: Safety: Goal: Ability to remain free from injury will improve Outcome: Not Progressing  Pt has a Oncologist and does not listen to the them. He is constantly getting out of bed. PRN medications have been utilized.

## 2024-05-19 NOTE — Plan of Care (Signed)
  Problem: Clinical Measurements: Goal: Will remain free from infection Outcome: Progressing Goal: Diagnostic test results will improve Outcome: Progressing Goal: Respiratory complications will improve Outcome: Progressing Goal: Cardiovascular complication will be avoided Outcome: Progressing   Problem: Activity: Goal: Risk for activity intolerance will decrease Outcome: Progressing   Problem: Nutrition: Goal: Adequate nutrition will be maintained Outcome: Progressing   Problem: Elimination: Goal: Will not experience complications related to bowel motility Outcome: Progressing Goal: Will not experience complications related to urinary retention Outcome: Progressing   Problem: Pain Managment: Goal: General experience of comfort will improve and/or be controlled Outcome: Progressing   Problem: Safety: Goal: Ability to remain free from injury will improve Outcome: Progressing   Problem: Skin Integrity: Goal: Risk for impaired skin integrity will decrease Outcome: Progressing   Problem: Safety: Goal: Non-violent Restraint(s) Outcome: Progressing

## 2024-05-19 NOTE — Progress Notes (Signed)
 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  Patient underwent CT angiogram of the chest, abdomen and pelvis with findings consistent with fecal impaction and stercoral colitis.  Also was noted to have urinary retention with Foley catheter placement.  Psychiatry was consulted for severe agitation; patient was transition from Seroquel  to Rexulti .  Seen by therapy with recommendation of SNF placement.  TOC following, medically stable for discharge once facility found.  Assessment & Plan:   Advanced Alzheimer's dementia with behavioral disturbance Patient presenting from [facility] nursing facility with agitation.  Currently followed by hospice outpatient for his terminal illness.  Patient previously on Seroquel .  Seen by psychiatry and now transition to Rexulti  -- Rexulti  2 mg p.o. daily -- Depakote  250 mg p.o. every 12 hours (Depakote  level 23 (low) on 9/25) -- Lorazepam  0.5 mg p.o. daily -- Celexa  20 mg p.o. nightly -- Haldol  5 mg IV every 6 hours as needed agitation  Urinary retention secondary to BPH Etiology likely multifactorial with BPH and fecal impaction.  Foley catheter was placed in the ED. -- Foley catheter removed on 9/24 -- Tamsulosin  0.4 mg p.o. BID -- Bladder scan as needed -- Monitor urine output  Stercoral colitis secondary to constipation/fecal impaction CT abdomen/pelvis with findings consistent with fecal impaction and stercoral colitis. -- Senokot-S1 tablet p.o. twice daily -- MiraLAX  daily as needed mild/moderate constipation  HTN -- Amlodipine  5 mg p.o. daily -- Losartan  100 mg p.o. daily -- Hydralazine   25 mg p.o. every 6 hours as needed SBP >180 or DBP >110  Hypothyroidism -- Levothyroxine  75 mcg p.o. daily  Lung nodules CT angiogram chest with noted multiple pulmonary nodules, most severe with 7 mm left solid pulm nodule upper lobe.  Although patient is currently on hospice due to his terminal illness as above, can consider repeat noncontrast CT chest in 3-6 months depending on patient/family goals of care at that time.  TB skin test ordered per social work request for placement.  Test results to be read and documented within 48-72 hours after injection on 9/27 -9/28)   DVT prophylaxis: enoxaparin  (LOVENOX ) injection 40 mg Start: 04/26/24 1000    Code Status: Limited: Do not attempt resuscitation (DNR) -DNR-LIMITED -Do Not Intubate/DNI  Family Communication: Updated spouse present at bedside this morning  Disposition Plan:  Level of care: Med-Surg Status is: Inpatient Remains inpatient appropriate because: Pending placement; anticipate [facility] on Monday    Consultants:  Psychiatry  Procedures:  None  Antimicrobials:  None   Subjective: Patient seen examined bedside, lying in bed, sleeping but easily arousable.  Spouse present at bedside.  Unable to obtain any further ROS from patient given his underlying dementia.  No acute concerns overnight per nursing staff.  Awaiting placement, anticipate discharge to [facility] memory care on Monday.  Needs TB test read  Objective: Vitals:   05/18/24 1300 05/18/24 2021 05/19/24 0604 05/19/24 1052  BP: 137/62 (!) 146/80 (!) 144/58 (!) 109/56  Pulse: 70 78 66   Resp: 18 20    Temp: 98.4 F (36.9 C) 98.8 F (37.1 C) 97.6 F (36.4 C)   TempSrc: Oral Oral Oral   SpO2:  97% 96% 98%   Weight:      Height:       No intake or output data in the 24 hours ending 05/19/24 1054  Filed Weights   04/26/24 0230  Weight: 67.5 kg    Examination:  Physical Exam: GEN: NAD, alert, pleasantly confused HEENT: NCAT, sclera  clear, MMM PULM: CTAB w/o wheezes/crackles, normal respiratory effort, on room air CV: RRR w/o M/G/R GI: abd soft, NTND, + BS MSK: no peripheral edema, moves all extremity dependently PSYCH: normal mood/affect    Data Reviewed: I have personally reviewed following labs and imaging studies  CBC: Recent Labs  Lab 05/13/24 0447  WBC 8.3  NEUTROABS 6.1  HGB 11.2*  HCT 37.0*  MCV 89.2  PLT 266   Basic Metabolic Panel: Recent Labs  Lab 05/13/24 0447 05/17/24 0405  NA 136 134*  K 4.7 4.5  CL 99 100  CO2 26 25  GLUCOSE 103* 87  BUN 26* 21  CREATININE 0.87 0.81  CALCIUM  9.2 9.3   GFR: Estimated Creatinine Clearance: 50.9 mL/min (by C-G formula based on SCr of 0.81 mg/dL). Liver Function Tests: Recent Labs  Lab 05/17/24 0405  AST 15  ALT 22  ALKPHOS 77  BILITOT 0.3  PROT 6.3*  ALBUMIN 3.5   No results for input(s): LIPASE, AMYLASE in the last 168 hours. No results for input(s): AMMONIA in the last 168 hours. Coagulation Profile: No results for input(s): INR, PROTIME in the last 168 hours. Cardiac Enzymes: No results for input(s): CKTOTAL, CKMB, CKMBINDEX, TROPONINI in the last 168 hours. BNP (last 3 results) Recent Labs    04/26/24 0407  PROBNP 520.0*   HbA1C: No results for input(s): HGBA1C in the last 72 hours. CBG: No results for input(s): GLUCAP in the last 168 hours.  Lipid Profile: No results for input(s): CHOL, HDL, LDLCALC, TRIG, CHOLHDL, LDLDIRECT in the last 72 hours. Thyroid  Function Tests: No results for input(s): TSH, T4TOTAL, FREET4, T3FREE, THYROIDAB in the last 72 hours. Anemia Panel: No results for input(s): VITAMINB12, FOLATE, FERRITIN, TIBC, IRON, RETICCTPCT in the last 72 hours. Sepsis Labs: No results for input(s): PROCALCITON, LATICACIDVEN in the last 168 hours.  No results found for this or any previous visit (from the past 240 hours).       Radiology Studies: No  results found.      Scheduled Meds:  amLODipine   5 mg Oral Daily   brexpiprazole   2 mg Oral Daily   Chlorhexidine  Gluconate Cloth  6 each Topical Daily   citalopram   20 mg Oral QHS   divalproex   250 mg Oral Q12H   enoxaparin  (LOVENOX ) injection  40 mg Subcutaneous Q24H   feeding supplement  237 mL Oral BID BM   levothyroxine   75 mcg Oral Q0600   LORazepam   0.5 mg Oral Q1400   losartan   100 mg Oral Daily   senna-docusate  1 tablet Oral BID   sodium chloride  flush  3 mL Intravenous Q12H   tamsulosin   0.4 mg Oral BID   tuberculin  5 Units Intradermal Once   Continuous Infusions:   LOS: 20 days    Time spent: 48 minutes spent on 05/19/2024 caring for this patient face-to-face including chart review, ordering labs/tests, documenting, discussion with nursing staff, consultants, updating family and interview/physical exam    Camellia PARAS [place], DO [facility] Available via Epic secure chat 7am-7pm After these hours, please refer to coverage provider listed on [email or web address] 05/19/2024, 10:54 AM

## 2024-05-20 DIAGNOSIS — I160 Hypertensive urgency: Secondary | ICD-10-CM | POA: Diagnosis not present

## 2024-05-20 MED ORDER — SENNOSIDES-DOCUSATE SODIUM 8.6-50 MG PO TABS
2.0000 | ORAL_TABLET | Freq: Two times a day (BID) | ORAL | Status: DC
  Administered 2024-05-20 – 2024-05-21 (×2): 2 via ORAL
  Filled 2024-05-20 (×2): qty 2

## 2024-05-20 NOTE — Progress Notes (Signed)
 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  Patient underwent CT angiogram of the chest, abdomen and pelvis with findings consistent with fecal impaction and stercoral colitis.  Also was noted to have urinary retention with Foley catheter placement.  Psychiatry was consulted for severe agitation; patient was transition from Seroquel  to Rexulti .  Seen by therapy with recommendation of SNF placement.  TOC following, medically stable for discharge once facility found.  Assessment & Plan:   Advanced Alzheimer's dementia with behavioral disturbance Patient presenting from [facility] nursing facility with agitation.  Currently followed by hospice outpatient for his terminal illness.  Patient previously on Seroquel .  Seen by psychiatry and now transition to Rexulti  -- Rexulti  2 mg p.o. daily -- Depakote  250 mg p.o. every 12 hours (Depakote  level 23 (low) on 9/25) -- Lorazepam  0.5 mg p.o. daily -- Celexa  20 mg p.o. nightly -- Haldol  5 mg IV every 6 hours as needed agitation  Urinary retention secondary to BPH Etiology likely multifactorial with BPH and fecal impaction.  Foley catheter was placed in the ED. -- Foley catheter removed on 9/24 -- Tamsulosin  0.4 mg p.o. BID -- Bladder scan as needed -- Monitor urine output  Stercoral colitis secondary to constipation/fecal impaction CT abdomen/pelvis with findings consistent with fecal impaction and stercoral colitis. -- Senokot-S 2 tablets p.o. twice daily -- MiraLAX  daily as needed mild/moderate constipation -- Bisacodyl  suppository 10 mg daily as needed moderate constipation  HTN --  Amlodipine  5 mg p.o. daily -- Losartan  100 mg p.o. daily -- Hydralazine  25 mg p.o. every 6 hours as needed SBP >180 or DBP >110  Hypothyroidism -- Levothyroxine  75 mcg p.o. daily  Lung nodules CT angiogram chest with noted multiple pulmonary nodules, most severe with 7 mm left solid pulm nodule upper lobe.  Although patient is currently on hospice due to his terminal illness as above, can consider repeat noncontrast CT chest in 3-6 months depending on patient/family goals of care at that time.  TB skin test ordered per social work request for placement.  Test results to be read and documented within 48-72 hours after injection on 9/27 -9/28)   DVT prophylaxis: enoxaparin  (LOVENOX ) injection 40 mg Start: 04/26/24 1000    Code Status: Limited: Do not attempt resuscitation (DNR) -DNR-LIMITED -Do Not Intubate/DNI  Family Communication: Updated spouse present at bedside this morning  Disposition Plan:  Level of care: Med-Surg Status is: Inpatient Remains inpatient appropriate because: Pending placement; anticipate [facility] on Monday    Consultants:  Psychiatry  Procedures:  None  Antimicrobials:  None   Subjective: Patient seen examined bedside, lying in bed, sleeping but easily arousable.  Spouse present at bedside.  Unable to obtain any further ROS from patient given his underlying dementia.  No acute concerns overnight per nursing staff.  Awaiting placement, anticipate discharge [facility] on tomorrow.  Needs TB test read today.  Objective: Vitals:   05/19/24 1052 05/19/24 1334 05/19/24 1944 05/20/24 0301  BP: (!) 109/56 (!) 131/105 (!) 170/65 (!) 162/73  Pulse: 64 71 68 70  Resp: 18 20 20 20   Temp: 97.8 F (36.6 C) 98.7 F (37.1 C)  97.6 F (36.4 C) 98 F (36.7 C)  TempSrc: Oral  Oral Oral  SpO2:  99% 100% 100%  Weight:      Height:        Intake/Output Summary (Last 24 hours) at 05/20/2024 1200 Last data filed at 05/19/2024 1336 Gross  per 24 hour  Intake 240 ml  Output --  Net 240 ml    Filed Weights   04/26/24 0230  Weight: 67.5 kg    Examination:  Physical Exam: GEN: NAD, alert, pleasantly confused HEENT: NCAT, sclera clear, MMM PULM: CTAB w/o wheezes/crackles, normal respiratory effort, on room air CV: RRR w/o M/G/R GI: abd soft, NTND, + BS MSK: no peripheral edema, moves all extremity dependently PSYCH: normal mood/affect    Data Reviewed: I have personally reviewed following labs and imaging studies  CBC: No results for input(s): WBC, NEUTROABS, HGB, HCT, MCV, PLT in the last 168 hours.  Basic Metabolic Panel: Recent Labs  Lab 05/17/24 0405  NA 134*  K 4.5  CL 100  CO2 25  GLUCOSE 87  BUN 21  CREATININE 0.81  CALCIUM  9.3   GFR: Estimated Creatinine Clearance: 50.9 mL/min (by C-G formula based on SCr of 0.81 mg/dL). Liver Function Tests: Recent Labs  Lab 05/17/24 0405  AST 15  ALT 22  ALKPHOS 77  BILITOT 0.3  PROT 6.3*  ALBUMIN 3.5   No results for input(s): LIPASE, AMYLASE in the last 168 hours. No results for input(s): AMMONIA in the last 168 hours. Coagulation Profile: No results for input(s): INR, PROTIME in the last 168 hours. Cardiac Enzymes: No results for input(s): CKTOTAL, CKMB, CKMBINDEX, TROPONINI in the last 168 hours. BNP (last 3 results) Recent Labs    04/26/24 0407  PROBNP 520.0*   HbA1C: No results for input(s): HGBA1C in the last 72 hours. CBG: No results for input(s): GLUCAP in the last 168 hours.  Lipid Profile: No results for input(s): CHOL, HDL, LDLCALC, TRIG, CHOLHDL, LDLDIRECT in the last 72 hours. Thyroid  Function Tests: No results for input(s): TSH, T4TOTAL, FREET4, T3FREE, THYROIDAB in the last 72 hours. Anemia Panel: No results for input(s): VITAMINB12, FOLATE, FERRITIN, TIBC, IRON, RETICCTPCT in the last 72 hours. Sepsis Labs: No results for input(s): PROCALCITON,  LATICACIDVEN in the last 168 hours.  No results found for this or any previous visit (from the past 240 hours).       Radiology Studies: No results found.      Scheduled Meds:  amLODipine   5 mg Oral Daily   brexpiprazole   2 mg Oral Daily   Chlorhexidine  Gluconate Cloth  6 each Topical Daily   citalopram   20 mg Oral QHS   divalproex   250 mg Oral Q12H   enoxaparin  (LOVENOX ) injection  40 mg Subcutaneous Q24H   feeding supplement  237 mL Oral BID BM   levothyroxine   75 mcg Oral Q0600   LORazepam   0.5 mg Oral Q1400   losartan   100 mg Oral Daily   senna-docusate  1 tablet Oral BID   sodium chloride  flush  3 mL Intravenous Q12H   tamsulosin   0.4 mg Oral BID   Continuous Infusions:   LOS: 21 days    Time spent: 48 minutes spent on 05/20/2024 caring for this patient face-to-face including chart review, ordering labs/tests, documenting, discussion with nursing staff, consultants, updating family and interview/physical exam    Camellia PARAS [place], DO [facility] Available via Epic secure chat 7am-7pm After these hours, please refer to coverage provider listed on [email or web address]  05/20/2024, 12:00 PM

## 2024-05-20 NOTE — Plan of Care (Signed)
  Problem: Clinical Measurements: Goal: Diagnostic test results will improve Outcome: Progressing Goal: Cardiovascular complication will be avoided Outcome: Progressing   Problem: Activity: Goal: Risk for activity intolerance will decrease Outcome: Progressing   Problem: Elimination: Goal: Will not experience complications related to bowel motility Outcome: Progressing

## 2024-05-21 DIAGNOSIS — I160 Hypertensive urgency: Secondary | ICD-10-CM | POA: Diagnosis not present

## 2024-05-21 MED ORDER — AMLODIPINE BESYLATE 5 MG PO TABS: 5.0000 mg | ORAL_TABLET | Freq: Every day | ORAL | Status: DC

## 2024-05-21 MED ORDER — LORAZEPAM 0.5 MG PO TABS: 0.5000 mg | ORAL_TABLET | Freq: Three times a day (TID) | ORAL | 0 refills | Status: DC | PRN

## 2024-05-21 MED ORDER — DIVALPROEX SODIUM 125 MG PO CSDR: 250.0000 mg | DELAYED_RELEASE_CAPSULE | Freq: Two times a day (BID) | ORAL | 0 refills | Status: DC

## 2024-05-21 MED ORDER — POLYETHYLENE GLYCOL 3350 17 G PO PACK: 17.0000 g | PACK | Freq: Every day | ORAL | Status: DC

## 2024-05-21 MED ORDER — BREXPIPRAZOLE 2 MG PO TABS: 2.0000 mg | ORAL_TABLET | Freq: Every day | ORAL | 0 refills | Status: DC

## 2024-05-21 MED ORDER — SENNOSIDES-DOCUSATE SODIUM 8.6-50 MG PO TABS: 2.0000 | ORAL_TABLET | Freq: Two times a day (BID) | ORAL | Status: DC

## 2024-05-21 MED ORDER — TAMSULOSIN HCL 0.4 MG PO CAPS: 0.4000 mg | ORAL_CAPSULE | Freq: Two times a day (BID) | ORAL | Status: DC

## 2024-05-21 MED ORDER — BISACODYL 10 MG RE SUPP: 10.0000 mg | Freq: Every day | RECTAL | Status: DC | PRN

## 2024-05-21 NOTE — TOC Transition Note (Signed)
 Transition of Care [facility]) - Discharge Note   Patient Details  Name: Julian [name] MRN: [id number] Date of Birth: 1926/06/11  Transition of Care [facility]) CM/SW Contact:  [name], LCSW Phone Number: 05/21/2024, 10:57 AM   Clinical Narrative:     Pt to d/c to [facility], TB results and d/c summary faxed to facility. CSW spoke with pt's spouse she reports she will provided transportation to facility. Care management sign off.   Final next level of care: Memory Care Barriers to Discharge: Barriers Resolved   Patient Goals and CMS Choice Patient states their goals for this hospitalization and ongoing recovery are:: [facility] Care          Discharge Placement                    Patient and family notified of of transfer: 05/21/24  Discharge Plan and Services Additional resources added to the After Visit Summary for                                       Social Drivers of Health (SDOH) Interventions SDOH Screenings   Food Insecurity: Patient Unable To Answer (04/26/2024)  Housing: Unknown (04/26/2024)  Transportation Needs: Patient Unable To Answer (04/26/2024)  Utilities: Patient Unable To Answer (04/26/2024)  Social Connections: Patient Unable To Answer (04/26/2024)  Tobacco Use: Medium Risk (04/26/2024)     Readmission Risk Interventions     No data to display

## 2024-05-21 NOTE — Progress Notes (Signed)
 Patient's TB mantoux test done on 05/19/2024 and read on 05/21/2024 was noted to be negative. Induration noted to site with measurement less than 3mm.

## 2024-05-21 NOTE — Discharge Summary (Signed)
 Physician Discharge Summary  Julian [name] FMW:[id number] DOB: [birth year] DOA: 04/25/2024  PCP: Julian [name]., MD  Admit date: 04/25/2024 Discharge date: 05/21/2024  Admitted From: [facility] assisted living facility Disposition:  [facility] memory care  Recommendations for Outpatient Follow-up:  Follow up with PCP in 1-2 weeks Discontinued Seroquel  Started on Rexulti  2 mg p.o. daily, Depakote  for dementia with behavioral disturbance; stable on new medication Started on tamsulosin  0.4 mg p.o. twice daily for urinary tension likely complicated by significant constipation.  Foley catheter was removed at time of discharge with ability to self void for several days  Please ensure patient has 1-2 bowel movements daily as this was a complicating factor to his agitation and urinary retention.  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  Patient underwent CT angiogram of the chest, abdomen and pelvis with findings consistent with fecal impaction and stercoral colitis.  Also was noted to have urinary retention with Foley catheter placement.  Psychiatry was consulted for severe agitation; patient was transition from Seroquel  to Rexulti .  Seen by therapy with recommendation of SNF placement.   Hospital course:  Advanced Alzheimer's dementia with behavioral disturbance Patient presenting from [facility] nursing facility with agitation.  Currently followed by hospice outpatient for his terminal illness.  Patient previously on Seroquel .  Seen by psychiatry and now transition to  Rexulti  2 mg p.o. twice daily, Depakote  250 mg p.o. every 12 hours.  Ativan  as needed.   Urinary retention secondary to BPH Etiology likely multifactorial with BPH and fecal impaction.  Foley catheter was placed in the ED. patient was started on tamsulosin  0.4 mg p.o. twice daily.  Foley catheter was removed on 05/16/2024 with ability to self void since.  Continue to monitor urine output.  Ensure patient has 1-2 bowel movements daily as this was a large complicating factor to his urinary retention.   Stercoral colitis secondary to constipation/fecal impaction CT abdomen/pelvis with findings consistent with fecal impaction and stercoral colitis.  Patient was started on Senokot-S 2 tablets p.o. twice daily, MiraLAX  daily with improvement.  Please ensure 1-2 bowel movements daily to avoid complications as above.  Bisacodyl  suppository daily as needed.    HTN Amlodipine  5 mg p.o. daily, Losartan  100 mg p.o. daily   Hypothyroidism Levothyroxine  75 mcg p.o. daily   Lung nodules CT angiogram chest with noted multiple pulmonary nodules, most severe with 7 mm left solid pulm nodule upper lobe.  Although patient is currently on hospice due to his terminal illness as above, can consider repeat noncontrast CT chest in 3-6 months depending on patient/family goals of care at that time.  Discharge Diagnoses:  Principal Problem:   Hypertensive urgency Active Problems:   Dementia with behavioral disturbance (HCC)   Acute urinary retention   Stercoral colitis   Hypothyroidism   Lung nodule    Discharge Instructions  Discharge Instructions     Call MD for:  difficulty breathing, headache or visual disturbances   Complete by: As directed    Call MD for:  extreme fatigue   Complete by: As directed    Call MD for:  persistant dizziness or light-headedness  Complete by: As directed    Call MD for:  persistant nausea and vomiting   Complete by: As directed    Call MD for:  severe uncontrolled pain    Complete by: As directed    Call MD for:  temperature >100.4   Complete by: As directed    Diet - low sodium heart healthy   Complete by: As directed    Increase activity slowly   Complete by: As directed    No wound care   Complete by: As directed       Allergies as of 05/21/2024   No Known Allergies      Medication List     STOP taking these medications    atorvastatin  20 MG tablet Commonly known as: LIPITOR   azithromycin  100 MG/5ML suspension Commonly known as: ZITHROMAX    mupirocin ointment 2 % Commonly known as: BACTROBAN   QUEtiapine  100 MG tablet Commonly known as: SEROQUEL        TAKE these medications    amLODipine  5 MG tablet Commonly known as: NORVASC  Take 1 tablet (5 mg total) by mouth daily. Start taking on: May 22, 2024 What changed:  medication strength how much to take   bisacodyl  10 MG suppository Commonly known as: DULCOLAX Place 1 suppository (10 mg total) rectally daily as needed for moderate constipation.   brexpiprazole  2 MG Tabs tablet Commonly known as: REXULTI  Take 1 tablet (2 mg total) by mouth daily. Start taking on: May 22, 2024   CENTRUM SILVER PO Take 1 tablet by mouth daily with breakfast.   citalopram  20 MG tablet Commonly known as: CELEXA  Take 20 mg by mouth at bedtime.   divalproex  125 MG capsule Commonly known as: DEPAKOTE  SPRINKLE Take 2 capsules (250 mg total) by mouth every 12 (twelve) hours. What changed: when to take this   fluticasone 50 MCG/ACT nasal spray Commonly known as: FLONASE Place 2 sprays into both nostrils at bedtime.   levothyroxine  75 MCG tablet Commonly known as: SYNTHROID  Take 75 mcg by mouth daily before breakfast.   LORazepam  0.5 MG tablet Commonly known as: ATIVAN  Take 1 tablet (0.5 mg total) by mouth every 8 (eight) hours as needed (aggitation). What changed:  when to take this reasons to take this additional instructions   losartan  100 MG tablet Commonly known  as: COZAAR  TAKE 1 TABLET BY MOUTH DAILY What changed: when to take this   polyethylene glycol 17 g packet Commonly known as: MIRALAX  / GLYCOLAX  Take 17 g by mouth daily.   senna-docusate 8.6-50 MG tablet Commonly known as: Senokot-S Take 2 tablets by mouth 2 (two) times daily.   Systane Balance 0.6 % Soln Generic drug: Propylene Glycol Place 1 drop into both eyes in the morning.   tamsulosin  0.4 MG Caps capsule Commonly known as: FLOMAX  Take 1 capsule (0.4 mg total) by mouth 2 (two) times daily.   Vitamin D3 125 MCG (5000 UT) Caps Take 5,000 Units by mouth daily.        Follow-up Information     Nicholaus Sherlyn CROME, NP. Go on 05/21/2024.   Why: 8:30 am Contact information: [address] [place] [address] [phone]         Julian [name]., MD. Schedule an appointment as soon as possible for a visit in 1 week(s).   Specialty: Internal Medicine Contact information: [address] [place] [address] [phone]  No Known Allergies  Consultations: Psychiatry   Procedures/Studies: CT Angio Chest/Abd/Pel for Dissection W and/or Wo Contrast Result Date: 04/26/2024 CLINICAL DATA:  Evaluate for dissection. EXAM: CT ANGIOGRAPHY CHEST, ABDOMEN AND PELVIS TECHNIQUE: Non-contrast CT of the chest was initially obtained. Multidetector CT imaging through the chest, abdomen and pelvis was performed using the standard protocol during bolus administration of intravenous contrast. Multiplanar reconstructed images and MIPs were obtained and reviewed to evaluate the vascular anatomy. RADIATION DOSE REDUCTION: This exam was performed according to the departmental dose-optimization program which includes automated exposure control, adjustment of the mA and/or kV according to patient size and/or use of iterative reconstruction technique. CONTRAST:  OMNIPAQUE  IOHEXOL  350 MG/ML SOLN COMPARISON:  None Available. FINDINGS: CTA CHEST FINDINGS  Cardiovascular: Preferential opacification of the thoracic aorta. No evidence of thoracic aortic aneurysm or dissection. Normal heart size. No pericardial effusion. There severe atherosclerotic calcifications of the aorta. Origin of the great vessels appears patent. There is no central pulmonary embolism. Mediastinum/Nodes: No enlarged mediastinal, hilar, or axillary lymph nodes. Thyroid  gland, trachea, and esophagus demonstrate no significant findings. Lungs/Pleura: There are ground-glass opacities with associated interstitial opacities in the dependent portions of the bilateral lower lobes and right upper lobe. There are trace bilateral pleural effusions. There is scarring in both lung apices. Peripheral slightly nodular density in the left upper lobe measures 7 mm image 8/24. There is a 3 mm right upper lobe nodule image 8/42. There is a 3 mm nodule in the left upper lobe image 8/75. Trachea and central airways are patent. Musculoskeletal: No acute osseous abnormality. There is bilateral gynecomastia. Review of the MIP images confirms the above findings. CTA ABDOMEN AND PELVIS FINDINGS VASCULAR Aorta: Normal caliber aorta without aneurysm, dissection, vasculitis or significant stenosis. There severe atherosclerotic calcifications of the aorta. Celiac: Patent without evidence of aneurysm, dissection, vasculitis or significant stenosis. SMA: Patent without evidence of aneurysm, dissection, vasculitis or significant stenosis. Renals: Both renal arteries are patent without evidence of aneurysm, dissection, vasculitis, fibromuscular dysplasia or significant stenosis. There is mild stenosis of the origin of the right renal artery secondary to calcified atherosclerotic disease. IMA: Patent without evidence of aneurysm, dissection, vasculitis or significant stenosis. There is moderate focal stenosis of the origin. Inflow: Patent without evidence of aneurysm, dissection, vasculitis or significant stenosis. Calcified  atherosclerotic disease present. Veins: No obvious venous abnormality within the limitations of this arterial phase study. Review of the MIP images confirms the above findings. NON-VASCULAR Hepatobiliary: No focal liver abnormality is seen. No gallstones, gallbladder wall thickening, or biliary dilatation. Pancreas: Unremarkable. No pancreatic ductal dilatation or surrounding inflammatory changes. Spleen: Normal in size without focal abnormality. Calcified granulomas are present. Adrenals/Urinary Tract: Small left peripelvic cysts are present. There is no definite hydronephrosis. No focal lesions are identified. The adrenal glands are within normal limits. The bladder is decompressed by Foley catheter. Stomach/Bowel: Rectum is dilated and stool-filled measuring up to 10 cm. There is rectal wall thickening. There is also large amount of stool within the transverse colon, ascending colon and sigmoid colon. There is no bowel obstruction, pneumatosis, focal inflammation or free air. Sigmoid colon diverticula are present. The appendix is not seen. Small bowel and stomach are within normal limits. Lymphatic: No enlarged lymph nodes are seen. Reproductive: Prostate is unremarkable. Other: No abdominal wall hernia or abnormality. No abdominopelvic ascites. Musculoskeletal: Degenerative changes affect the spine. There is levoconvex curvature of the lumbar spine. Review of the MIP images confirms the above findings. IMPRESSION: 1. No evidence  for aortic dissection or aneurysm. 2. Severe atherosclerotic calcifications of the aorta. 3. Ground-glass and interstitial opacities in the dependent portions of the bilateral lower lobes and right upper lobe with trace bilateral pleural effusions. Findings may be related to pulmonary edema or infection. 4. Multiple pulmonary nodules. Most severe: 7 mm left solid pulmonary nodule within the upper lobe. Non-contrast chest CT at 3-6 months is recommended. If the nodules are stable at time  of repeat CT, then future CT at 18-24 months (from today's scan) is considered optional for low-risk patients, but is recommended for high-risk patients. This recommendation follows the consensus statement: Guidelines for Management of Incidental Pulmonary Nodules Detected on CT Images: From the Fleischner Society 2017; Radiology 2017; 284:228-243. 5. Dilated stool-filled rectum with rectal wall thickening. Findings are compatible with fecal impaction and stercoral colitis. 6. Colonic diverticulosis. 7. Aortic atherosclerosis. Aortic Atherosclerosis (ICD10-I70.0). Electronically Signed   By: Greig Pique M.D.   On: 04/26/2024 00:06   CT Head Wo Contrast Result Date: 04/25/2024 CLINICAL DATA:  Mental status change, unknown cause EXAM: CT HEAD WITHOUT CONTRAST TECHNIQUE: Contiguous axial images were obtained from the base of the skull through the vertex without intravenous contrast. RADIATION DOSE REDUCTION: This exam was performed according to the departmental dose-optimization program which includes automated exposure control, adjustment of the mA and/or kV according to patient size and/or use of iterative reconstruction technique. COMPARISON:  Head CT 09/12/2023 FINDINGS: Brain: No intracranial hemorrhage, mass effect, or midline shift. No hydrocephalus. The basilar cisterns are patent. Stable degree of atrophy and chronic small vessel ischemia. Again seen remote lacunar infarcts in bilateral caudate and right basal ganglia. No evidence of territorial infarct or acute ischemia. No extra-axial or intracranial fluid collection. Vascular: Atherosclerosis of skullbase vasculature without hyperdense vessel or abnormal calcification. Skull: No fracture or focal lesion. Sinuses/Orbits: Chronic mucosal thickening within the anterior paranasal sinuses. No sinus fluid levels. No mastoid effusion. Other: None. IMPRESSION: 1. No acute intracranial abnormality. 2. Stable atrophy, chronic small vessel ischemia, and remote  lacunar infarcts. Electronically Signed   By: Andrea Gasman M.D.   On: 04/25/2024 18:02   DG Chest 1 View Result Date: 04/25/2024 CLINICAL DATA:  Altered mental status. EXAM: CHEST  1 VIEW COMPARISON:  10/09/2023 FINDINGS: Stable poor inspiration, borderline enlargement of the cardiac silhouette and mild elevation of the left hemidiaphragm. Clear lungs with normal vascularity. No acute bony abnormality. IMPRESSION: No acute abnormality. Electronically Signed   By: Elspeth Bathe M.D.   On: 04/25/2024 16:34     Subjective: Patient seen examined bedside, lying in bed.  Spouse present.  Eating breakfast.  Remains pleasantly confused.  Discharging to [facility] memory care today.  Spouse with no questions or concerns at this time.  Unable to obtain any ROS from patient given his underlying dementia, but not acutely distressed.  No acute concerns overnight per nursing staff.  Discharge Exam: Vitals:   05/20/24 2002 05/21/24 0653  BP: (!) 173/72 (!) 151/96  Pulse: 77 70  Resp:  19  Temp: 97.8 F (36.6 C) 97.7 F (36.5 C)  SpO2: 98% 96%   Vitals:   05/20/24 0301 05/20/24 1247 05/20/24 2002 05/21/24 0653  BP: (!) 162/73 (!) 105/46 (!) 173/72 (!) 151/96  Pulse: 70 62 77 70  Resp: 20 18  19   Temp: 98 F (36.7 C) 97.8 F (36.6 C) 97.8 F (36.6 C) 97.7 F (36.5 C)  TempSrc: Oral Oral Oral Oral  SpO2: 100% 98% 98% 96%  Weight:  Height:        Physical Exam: GEN: NAD, alert, pleasantly confused HEENT: NCAT, sclera clear, MMM PULM: CTAB w/o wheezes/crackles, normal respiratory effort, on room air CV: RRR w/o M/G/R GI: abd soft, NTND, + BS MSK: no peripheral edema, moves all extremities independently PSYCH: normal mood/affect    The results of significant diagnostics from this hospitalization (including imaging, microbiology, ancillary and laboratory) are listed below for reference.     Microbiology: No results found for this or any previous visit (from the past 240  hours).   Labs: BNP (last 3 results) No results for input(s): BNP in the last 8760 hours. Basic Metabolic Panel: Recent Labs  Lab 05/17/24 0405  NA 134*  K 4.5  CL 100  CO2 25  GLUCOSE 87  BUN 21  CREATININE 0.81  CALCIUM  9.3   Liver Function Tests: Recent Labs  Lab 05/17/24 0405  AST 15  ALT 22  ALKPHOS 77  BILITOT 0.3  PROT 6.3*  ALBUMIN 3.5   No results for input(s): LIPASE, AMYLASE in the last 168 hours. No results for input(s): AMMONIA in the last 168 hours. CBC: No results for input(s): WBC, NEUTROABS, HGB, HCT, MCV, PLT in the last 168 hours. Cardiac Enzymes: No results for input(s): CKTOTAL, CKMB, CKMBINDEX, TROPONINI in the last 168 hours. BNP: Invalid input(s): POCBNP CBG: No results for input(s): GLUCAP in the last 168 hours. D-Dimer No results for input(s): DDIMER in the last 72 hours. Hgb A1c No results for input(s): HGBA1C in the last 72 hours. Lipid Profile No results for input(s): CHOL, HDL, LDLCALC, TRIG, CHOLHDL, LDLDIRECT in the last 72 hours. Thyroid  function studies No results for input(s): TSH, T4TOTAL, T3FREE, THYROIDAB in the last 72 hours.  Invalid input(s): FREET3 Anemia work up No results for input(s): VITAMINB12, FOLATE, FERRITIN, TIBC, IRON, RETICCTPCT in the last 72 hours. Urinalysis    Component Value Date/Time   COLORURINE YELLOW 04/26/2024 2219   APPEARANCEUR HAZY (A) 04/26/2024 2219   LABSPEC 1.026 04/26/2024 2219   PHURINE 5.0 04/26/2024 2219   GLUCOSEU NEGATIVE 04/26/2024 2219   HGBUR LARGE (A) 04/26/2024 2219   BILIRUBINUR NEGATIVE 04/26/2024 2219   KETONESUR NEGATIVE 04/26/2024 2219   PROTEINUR NEGATIVE 04/26/2024 2219   NITRITE NEGATIVE 04/26/2024 2219   LEUKOCYTESUR MODERATE (A) 04/26/2024 2219   Sepsis Labs No results for input(s): WBC in the last 168 hours.  Invalid input(s): PROCALCITONIN, LACTICIDVEN Microbiology No results  found for this or any previous visit (from the past 240 hours).   Time coordinating discharge: Over 30 minutes  SIGNED:   Camellia PARAS [place], DO  [facility] 05/21/2024, 10:15 AM

## 2024-05-25 DIAGNOSIS — Z23 Encounter for immunization: Secondary | ICD-10-CM | POA: Diagnosis not present

## 2024-05-25 DIAGNOSIS — R339 Retention of urine, unspecified: Secondary | ICD-10-CM | POA: Diagnosis not present

## 2024-05-25 DIAGNOSIS — F02C11 Dementia in other diseases classified elsewhere, severe, with agitation: Secondary | ICD-10-CM | POA: Diagnosis not present

## 2024-05-25 DIAGNOSIS — G301 Alzheimer's disease with late onset: Secondary | ICD-10-CM | POA: Diagnosis not present

## 2024-05-25 DIAGNOSIS — R197 Diarrhea, unspecified: Secondary | ICD-10-CM | POA: Diagnosis not present

## 2024-06-08 DIAGNOSIS — E039 Hypothyroidism, unspecified: Secondary | ICD-10-CM | POA: Diagnosis not present

## 2024-06-08 DIAGNOSIS — N400 Enlarged prostate without lower urinary tract symptoms: Secondary | ICD-10-CM | POA: Diagnosis not present

## 2024-06-08 DIAGNOSIS — F02811 Dementia in other diseases classified elsewhere, unspecified severity, with agitation: Secondary | ICD-10-CM | POA: Diagnosis not present

## 2024-06-08 DIAGNOSIS — D649 Anemia, unspecified: Secondary | ICD-10-CM | POA: Diagnosis not present

## 2024-06-08 DIAGNOSIS — H04129 Dry eye syndrome of unspecified lacrimal gland: Secondary | ICD-10-CM | POA: Diagnosis not present

## 2024-06-08 DIAGNOSIS — I679 Cerebrovascular disease, unspecified: Secondary | ICD-10-CM | POA: Diagnosis not present

## 2024-06-08 DIAGNOSIS — E785 Hyperlipidemia, unspecified: Secondary | ICD-10-CM | POA: Diagnosis not present

## 2024-06-08 DIAGNOSIS — F01518 Vascular dementia, unspecified severity, with other behavioral disturbance: Secondary | ICD-10-CM | POA: Diagnosis not present

## 2024-06-08 DIAGNOSIS — J302 Other seasonal allergic rhinitis: Secondary | ICD-10-CM | POA: Diagnosis not present

## 2024-06-08 DIAGNOSIS — R1310 Dysphagia, unspecified: Secondary | ICD-10-CM | POA: Diagnosis not present

## 2024-06-08 DIAGNOSIS — I10 Essential (primary) hypertension: Secondary | ICD-10-CM | POA: Diagnosis not present

## 2024-06-14 ENCOUNTER — Emergency Department (HOSPITAL_COMMUNITY)

## 2024-06-14 ENCOUNTER — Encounter (HOSPITAL_COMMUNITY): Payer: Self-pay

## 2024-06-14 ENCOUNTER — Other Ambulatory Visit: Payer: Self-pay

## 2024-06-14 ENCOUNTER — Inpatient Hospital Stay (HOSPITAL_COMMUNITY)
Admission: EM | Admit: 2024-06-14 | Discharge: 2024-06-26 | DRG: 690 | Disposition: A | Discharge status: Hospice | Source: Skilled Nursing Facility | Attending: Internal Medicine | Admitting: Internal Medicine

## 2024-06-14 DIAGNOSIS — W503XXA Accidental bite by another person, initial encounter: Secondary | ICD-10-CM

## 2024-06-14 DIAGNOSIS — Z87438 Personal history of other diseases of male genital organs: Secondary | ICD-10-CM | POA: Insufficient documentation

## 2024-06-14 DIAGNOSIS — Z87891 Personal history of nicotine dependence: Secondary | ICD-10-CM

## 2024-06-14 DIAGNOSIS — Y92128 Other place in nursing home as the place of occurrence of the external cause: Secondary | ICD-10-CM

## 2024-06-14 DIAGNOSIS — E785 Hyperlipidemia, unspecified: Secondary | ICD-10-CM | POA: Diagnosis not present

## 2024-06-14 DIAGNOSIS — S61412A Laceration without foreign body of left hand, initial encounter: Secondary | ICD-10-CM | POA: Diagnosis present

## 2024-06-14 DIAGNOSIS — F03911 Unspecified dementia, unspecified severity, with agitation: Secondary | ICD-10-CM

## 2024-06-14 DIAGNOSIS — I10 Essential (primary) hypertension: Secondary | ICD-10-CM | POA: Diagnosis present

## 2024-06-14 DIAGNOSIS — K5909 Other constipation: Secondary | ICD-10-CM | POA: Diagnosis not present

## 2024-06-14 DIAGNOSIS — N3000 Acute cystitis without hematuria: Principal | ICD-10-CM | POA: Diagnosis present

## 2024-06-14 DIAGNOSIS — F02811 Dementia in other diseases classified elsewhere, unspecified severity, with agitation: Secondary | ICD-10-CM | POA: Diagnosis not present

## 2024-06-14 DIAGNOSIS — L089 Local infection of the skin and subcutaneous tissue, unspecified: Secondary | ICD-10-CM

## 2024-06-14 DIAGNOSIS — B9620 Unspecified Escherichia coli [E. coli] as the cause of diseases classified elsewhere: Secondary | ICD-10-CM | POA: Diagnosis present

## 2024-06-14 DIAGNOSIS — R4182 Altered mental status, unspecified: Secondary | ICD-10-CM | POA: Diagnosis not present

## 2024-06-14 DIAGNOSIS — F0284 Dementia in other diseases classified elsewhere, unspecified severity, with anxiety: Secondary | ICD-10-CM | POA: Diagnosis not present

## 2024-06-14 DIAGNOSIS — R451 Restlessness and agitation: Secondary | ICD-10-CM | POA: Diagnosis not present

## 2024-06-14 DIAGNOSIS — Z8546 Personal history of malignant neoplasm of prostate: Secondary | ICD-10-CM

## 2024-06-14 DIAGNOSIS — Z9079 Acquired absence of other genital organ(s): Secondary | ICD-10-CM | POA: Diagnosis not present

## 2024-06-14 DIAGNOSIS — E039 Hypothyroidism, unspecified: Secondary | ICD-10-CM | POA: Diagnosis present

## 2024-06-14 DIAGNOSIS — Z79899 Other long term (current) drug therapy: Secondary | ICD-10-CM

## 2024-06-14 DIAGNOSIS — F0280 Dementia in other diseases classified elsewhere without behavioral disturbance: Secondary | ICD-10-CM | POA: Insufficient documentation

## 2024-06-14 DIAGNOSIS — S61452A Open bite of left hand, initial encounter: Secondary | ICD-10-CM | POA: Diagnosis present

## 2024-06-14 DIAGNOSIS — Z66 Do not resuscitate: Secondary | ICD-10-CM | POA: Diagnosis not present

## 2024-06-14 DIAGNOSIS — K5900 Constipation, unspecified: Secondary | ICD-10-CM | POA: Diagnosis not present

## 2024-06-14 DIAGNOSIS — Z820 Family history of epilepsy and other diseases of the nervous system: Secondary | ICD-10-CM | POA: Diagnosis not present

## 2024-06-14 DIAGNOSIS — G309 Alzheimer's disease, unspecified: Secondary | ICD-10-CM | POA: Diagnosis present

## 2024-06-14 DIAGNOSIS — Z781 Physical restraint status: Secondary | ICD-10-CM | POA: Diagnosis not present

## 2024-06-14 DIAGNOSIS — Z8249 Family history of ischemic heart disease and other diseases of the circulatory system: Secondary | ICD-10-CM

## 2024-06-14 DIAGNOSIS — F05 Delirium due to known physiological condition: Secondary | ICD-10-CM | POA: Diagnosis not present

## 2024-06-14 DIAGNOSIS — F02818 Dementia in other diseases classified elsewhere, unspecified severity, with other behavioral disturbance: Secondary | ICD-10-CM | POA: Diagnosis present

## 2024-06-14 DIAGNOSIS — N400 Enlarged prostate without lower urinary tract symptoms: Secondary | ICD-10-CM | POA: Diagnosis not present

## 2024-06-14 DIAGNOSIS — N390 Urinary tract infection, site not specified: Secondary | ICD-10-CM

## 2024-06-14 DIAGNOSIS — F0283 Dementia in other diseases classified elsewhere, unspecified severity, with mood disturbance: Secondary | ICD-10-CM | POA: Diagnosis not present

## 2024-06-14 DIAGNOSIS — R109 Unspecified abdominal pain: Secondary | ICD-10-CM | POA: Diagnosis not present

## 2024-06-14 DIAGNOSIS — Z79890 Hormone replacement therapy (postmenopausal): Secondary | ICD-10-CM

## 2024-06-14 DIAGNOSIS — M7989 Other specified soft tissue disorders: Secondary | ICD-10-CM | POA: Diagnosis not present

## 2024-06-14 DIAGNOSIS — M19042 Primary osteoarthritis, left hand: Secondary | ICD-10-CM | POA: Diagnosis not present

## 2024-06-14 DIAGNOSIS — Z23 Encounter for immunization: Secondary | ICD-10-CM | POA: Diagnosis not present

## 2024-06-14 DIAGNOSIS — F32A Depression, unspecified: Secondary | ICD-10-CM | POA: Diagnosis not present

## 2024-06-14 DIAGNOSIS — Z0389 Encounter for observation for other suspected diseases and conditions ruled out: Secondary | ICD-10-CM | POA: Diagnosis not present

## 2024-06-14 LAB — URINALYSIS, ROUTINE W REFLEX MICROSCOPIC
Bilirubin Urine: NEGATIVE
Glucose, UA: NEGATIVE mg/dL
Hgb urine dipstick: NEGATIVE
Ketones, ur: NEGATIVE mg/dL
Nitrite: POSITIVE — AB
Protein, ur: NEGATIVE mg/dL
Specific Gravity, Urine: 1.019 (ref 1.005–1.030)
WBC, UA: >50 WBC/hpf (ref 0–5)
pH: 5 (ref 5.0–8.0)

## 2024-06-14 MED ORDER — SODIUM CHLORIDE 0.9 % IV SOLN
1.0000 g | Freq: Once | INTRAVENOUS | Status: AC
  Administered 2024-06-14: 1 g via INTRAVENOUS
  Filled 2024-06-14: qty 10

## 2024-06-14 MED ORDER — TETANUS-DIPHTH-ACELL PERTUSSIS 5-2-15.5 LF-MCG/0.5 IM SUSP
0.5000 mL | Freq: Once | INTRAMUSCULAR | Status: AC
  Administered 2024-06-14: 0.5 mL via INTRAMUSCULAR
  Filled 2024-06-14: qty 0.5

## 2024-06-14 MED ORDER — AMOXICILLIN-POT CLAVULANATE 875-125 MG PO TABS
1.0000 | ORAL_TABLET | Freq: Once | ORAL | Status: AC
  Administered 2024-06-14: 1 via ORAL
  Filled 2024-06-14: qty 1
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
## 2024-06-15 ENCOUNTER — Encounter (HOSPITAL_COMMUNITY): Payer: Self-pay | Admitting: Internal Medicine

## 2024-06-15 DIAGNOSIS — G309 Alzheimer's disease, unspecified: Secondary | ICD-10-CM

## 2024-06-15 DIAGNOSIS — W503XXA Accidental bite by another person, initial encounter: Secondary | ICD-10-CM

## 2024-06-15 DIAGNOSIS — Z87438 Personal history of other diseases of male genital organs: Secondary | ICD-10-CM

## 2024-06-15 DIAGNOSIS — F0280 Dementia in other diseases classified elsewhere without behavioral disturbance: Secondary | ICD-10-CM

## 2024-06-15 DIAGNOSIS — S61459A Open bite of unspecified hand, initial encounter: Secondary | ICD-10-CM

## 2024-06-15 DIAGNOSIS — N3000 Acute cystitis without hematuria: Secondary | ICD-10-CM | POA: Diagnosis not present

## 2024-06-15 DIAGNOSIS — L089 Local infection of the skin and subcutaneous tissue, unspecified: Secondary | ICD-10-CM

## 2024-06-15 DIAGNOSIS — S61452A Open bite of left hand, initial encounter: Secondary | ICD-10-CM

## 2024-06-15 DIAGNOSIS — K5909 Other constipation: Secondary | ICD-10-CM | POA: Diagnosis not present

## 2024-06-15 DIAGNOSIS — E7849 Other hyperlipidemia: Secondary | ICD-10-CM

## 2024-06-15 DIAGNOSIS — E039 Hypothyroidism, unspecified: Secondary | ICD-10-CM

## 2024-06-15 DIAGNOSIS — I10 Essential (primary) hypertension: Secondary | ICD-10-CM

## 2024-06-15 LAB — COMPREHENSIVE METABOLIC PANEL WITH GFR
ALT: 13 U/L (ref 0–44)
ALT: 11 U/L (ref 0–44)
AST: 17 U/L (ref 15–41)
AST: 18 U/L (ref 15–41)
Albumin: 3.7 g/dL (ref 3.5–5.0)
Albumin: 3.8 g/dL (ref 3.5–5.0)
Alkaline Phosphatase: 82 U/L (ref 38–126)
Alkaline Phosphatase: 85 U/L (ref 38–126)
Anion gap: 11 (ref 5–15)
Anion gap: 11 (ref 5–15)
BUN: 24 mg/dL — ABNORMAL HIGH (ref 8–23)
BUN: 20 mg/dL (ref 8–23)
CO2: 26 mmol/L (ref 22–32)
CO2: 25 mmol/L (ref 22–32)
Calcium: 9.3 mg/dL (ref 8.9–10.3)
Calcium: 9.3 mg/dL (ref 8.9–10.3)
Chloride: 104 mmol/L (ref 98–111)
Chloride: 101 mmol/L (ref 98–111)
Creatinine, Ser: 0.96 mg/dL (ref 0.61–1.24)
Creatinine, Ser: 0.79 mg/dL (ref 0.61–1.24)
GFR, Estimated: >60 mL/min (ref >60)
GFR, Estimated: >60 mL/min (ref >60)
Glucose, Bld: 112 mg/dL — ABNORMAL HIGH (ref 70–99)
Glucose, Bld: 125 mg/dL — ABNORMAL HIGH (ref 70–99)
Potassium: 4.4 mmol/L (ref 3.5–5.1)
Potassium: 3.9 mmol/L (ref 3.5–5.1)
Sodium: 141 mmol/L (ref 135–145)
Sodium: 137 mmol/L (ref 135–145)
Total Bilirubin: 0.3 mg/dL (ref 0.0–1.2)
Total Bilirubin: 0.3 mg/dL (ref 0.0–1.2)
Total Protein: 6.8 g/dL (ref 6.5–8.1)
Total Protein: 7.2 g/dL (ref 6.5–8.1)

## 2024-06-15 LAB — CBC
HCT: 34.9 % — ABNORMAL LOW (ref 39.0–52.0)
HCT: 36.9 % — ABNORMAL LOW (ref 39.0–52.0)
Hemoglobin: 10.7 g/dL — ABNORMAL LOW (ref 13.0–17.0)
Hemoglobin: 11.5 g/dL — ABNORMAL LOW (ref 13.0–17.0)
MCH: 27.9 pg (ref 26.0–34.0)
MCH: 27.9 pg (ref 26.0–34.0)
MCHC: 30.7 g/dL (ref 30.0–36.0)
MCHC: 31.2 g/dL (ref 30.0–36.0)
MCV: 91.1 fL (ref 80.0–100.0)
MCV: 89.6 fL (ref 80.0–100.0)
Platelets: 232 K/uL (ref 150–400)
Platelets: 246 K/uL (ref 150–400)
RBC: 3.83 MIL/uL — ABNORMAL LOW (ref 4.22–5.81)
RBC: 4.12 MIL/uL — ABNORMAL LOW (ref 4.22–5.81)
RDW: 14.2 % (ref 11.5–15.5)
RDW: 14.1 % (ref 11.5–15.5)
WBC: 4.7 K/uL (ref 4.0–10.5)
WBC: 6.5 K/uL (ref 4.0–10.5)
nRBC: 0 % (ref 0.0–0.2)
nRBC: 0 % (ref 0.0–0.2)

## 2024-06-15 MED ORDER — BISACODYL 10 MG RE SUPP: 10.0000 mg | Freq: Every day | RECTAL | Status: DC | PRN

## 2024-06-15 MED ORDER — TRAZODONE HCL 50 MG PO TABS
150.0000 mg | ORAL_TABLET | Freq: Every day | ORAL | Status: DC
  Administered 2024-06-15 – 2024-06-16 (×3): 150 mg via ORAL
  Filled 2024-06-15 (×3): qty 1

## 2024-06-15 MED ORDER — CITALOPRAM HYDROBROMIDE 20 MG PO TABS
20.0000 mg | ORAL_TABLET | Freq: Every day | ORAL | Status: DC
  Administered 2024-06-15 – 2024-06-25 (×12): 20 mg via ORAL
  Filled 2024-06-15 (×12): qty 1

## 2024-06-15 MED ORDER — LEVOTHYROXINE SODIUM 75 MCG PO TABS
75.0000 ug | ORAL_TABLET | Freq: Every day | ORAL | Status: DC
  Administered 2024-06-15 – 2024-06-26 (×10): 75 ug via ORAL
  Filled 2024-06-15 (×11): qty 1

## 2024-06-15 MED ORDER — AMOXICILLIN-POT CLAVULANATE 875-125 MG PO TABS: 1.0000 | ORAL_TABLET | Freq: Two times a day (BID) | ORAL | Status: DC

## 2024-06-15 MED ORDER — SODIUM CHLORIDE 0.9% FLUSH
3.0000 mL | Freq: Two times a day (BID) | INTRAVENOUS | Status: DC
  Administered 2024-06-15 – 2024-06-26 (×18): 3 mL via INTRAVENOUS

## 2024-06-15 MED ORDER — SODIUM CHLORIDE 0.9 % IV SOLN: 1.0000 g | INTRAVENOUS | Status: DC

## 2024-06-15 MED ORDER — AMLODIPINE BESYLATE 5 MG PO TABS
5.0000 mg | ORAL_TABLET | Freq: Every day | ORAL | Status: DC
  Administered 2024-06-15 – 2024-06-26 (×10): 5 mg via ORAL
  Filled 2024-06-15 (×12): qty 1

## 2024-06-15 MED ORDER — TAMSULOSIN HCL 0.4 MG PO CAPS
0.4000 mg | ORAL_CAPSULE | Freq: Two times a day (BID) | ORAL | Status: DC
  Administered 2024-06-15 – 2024-06-26 (×24): 0.4 mg via ORAL
  Filled 2024-06-15 (×24): qty 1

## 2024-06-15 MED ORDER — LOSARTAN POTASSIUM 50 MG PO TABS
100.0000 mg | ORAL_TABLET | Freq: Every day | ORAL | Status: DC
  Administered 2024-06-15 – 2024-06-26 (×10): 100 mg via ORAL
  Filled 2024-06-15 (×12): qty 2

## 2024-06-15 MED ORDER — ONDANSETRON HCL 4 MG/2ML IJ SOLN: 4.0000 mg | Freq: Four times a day (QID) | INTRAMUSCULAR | Status: DC | PRN

## 2024-06-15 MED ORDER — ACETAMINOPHEN 650 MG RE SUPP: 650.0000 mg | Freq: Four times a day (QID) | RECTAL | Status: DC | PRN

## 2024-06-15 MED ORDER — SODIUM CHLORIDE 0.9% FLUSH
3.0000 mL | Freq: Two times a day (BID) | INTRAVENOUS | Status: DC
  Administered 2024-06-15 – 2024-06-26 (×13): 3 mL via INTRAVENOUS

## 2024-06-15 MED ORDER — DIVALPROEX SODIUM 125 MG PO CSDR
250.0000 mg | DELAYED_RELEASE_CAPSULE | Freq: Two times a day (BID) | ORAL | Status: DC
  Administered 2024-06-15 – 2024-06-26 (×24): 250 mg via ORAL
  Filled 2024-06-15 (×24): qty 2

## 2024-06-15 MED ORDER — ACETAMINOPHEN 325 MG PO TABS
650.0000 mg | ORAL_TABLET | Freq: Four times a day (QID) | ORAL | Status: DC | PRN
  Administered 2024-06-15 – 2024-06-26 (×6): 650 mg via ORAL
  Filled 2024-06-15 (×7): qty 2

## 2024-06-15 MED ORDER — AMOXICILLIN-POT CLAVULANATE 875-125 MG PO TABS
1.0000 | ORAL_TABLET | Freq: Two times a day (BID) | ORAL | Status: DC
  Administered 2024-06-15 – 2024-06-20 (×12): 1 via ORAL
  Filled 2024-06-15 (×12): qty 1

## 2024-06-15 MED ORDER — BREXPIPRAZOLE 1 MG PO TABS
2.0000 mg | ORAL_TABLET | Freq: Every day | ORAL | Status: DC
  Administered 2024-06-15 – 2024-06-17 (×3): 2 mg via ORAL
  Filled 2024-06-15 (×3): qty 2

## 2024-06-15 MED ORDER — ONDANSETRON HCL 4 MG PO TABS: 4.0000 mg | ORAL_TABLET | Freq: Four times a day (QID) | ORAL | Status: DC | PRN

## 2024-06-15 MED ORDER — POLYETHYLENE GLYCOL 3350 17 G PO PACK
17.0000 g | PACK | Freq: Every day | ORAL | Status: DC
  Administered 2024-06-16 – 2024-06-17 (×2): 17 g via ORAL
  Filled 2024-06-15 (×4): qty 1

## 2024-06-15 MED ORDER — SENNOSIDES-DOCUSATE SODIUM 8.6-50 MG PO TABS
2.0000 | ORAL_TABLET | Freq: Two times a day (BID) | ORAL | Status: DC
  Administered 2024-06-15 – 2024-06-18 (×6): 2 via ORAL
  Filled 2024-06-15 (×9): qty 2

## 2024-06-15 MED ORDER — LORAZEPAM 0.5 MG PO TABS: 0.5000 mg | ORAL_TABLET | Freq: Three times a day (TID) | ORAL | Status: DC | PRN

## 2024-06-15 MED ORDER — SODIUM CHLORIDE 0.9 % IV SOLN: 250.0000 mL | INTRAVENOUS | Status: AC | PRN

## 2024-06-15 MED ORDER — HALOPERIDOL LACTATE 5 MG/ML IJ SOLN
2.0000 mg | Freq: Four times a day (QID) | INTRAMUSCULAR | Status: DC | PRN
  Administered 2024-06-15: 2 mg via INTRAMUSCULAR
  Filled 2024-06-15: qty 1

## 2024-06-15 MED ORDER — SODIUM CHLORIDE 0.9% FLUSH: 3.0000 mL | INTRAVENOUS | Status: DC | PRN

## 2024-06-15 NOTE — Progress Notes (Signed)
 SLP Cancellation Note  Patient Details Name: Chau Sawin MRN: [id number] DOB: 03-20-1926   Cancelled treatment:       Reason Eval/Treat Not Completed: Other (comment). SLP evaluation not appropriate given severe baseline dementia. Will d/c order.    Reisa Coppola, Consuelo Fitch 06/15/2024, 10:11 AM
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
## 2024-06-15 NOTE — Plan of Care (Signed)
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
## 2024-06-16 DIAGNOSIS — S61459A Open bite of unspecified hand, initial encounter: Secondary | ICD-10-CM | POA: Diagnosis not present

## 2024-06-16 DIAGNOSIS — S61452A Open bite of left hand, initial encounter: Secondary | ICD-10-CM | POA: Diagnosis not present

## 2024-06-16 DIAGNOSIS — G309 Alzheimer's disease, unspecified: Secondary | ICD-10-CM | POA: Diagnosis not present

## 2024-06-16 DIAGNOSIS — N3000 Acute cystitis without hematuria: Secondary | ICD-10-CM | POA: Diagnosis not present

## 2024-06-16 NOTE — TOC Progression Note (Signed)
 Transition of Care [facility]) - Progression Note    Patient Details  Name: Julian [name] MRN: [id number] Date of Birth: [birth year]  Transition [facility] Care [facility]) CM/SW Contact  Sonda Manuella Quill, RN Phone Number: 06/16/2024, 3:13 PM  Clinical Narrative:    Notified by Randine Nail, [facility] Liaison pt's wife has revoked hospice benefit, and agency will be following pt for OP Pall Care; agency contact info placed in follow up provider section of d/c instructions; also notified pt's wife   Expected Discharge Plan: Hospice Medical Facility Barriers to Discharge: Continued Medical Work up               Expected Discharge Plan and Services     Post Acute Care Choice: Skilled Nursing Facility [facility]) Living arrangements for the past 2 months: Skilled Nursing Facility [facility])                                       Social Drivers of Health (SDOH) Interventions SDOH Screenings   Food Insecurity: No Food Insecurity (06/15/2024)  Housing: Low Risk  (06/15/2024)  Transportation Needs: No Transportation Needs (06/15/2024)  Utilities: Not At Risk (06/15/2024)  Social Connections: Moderately Isolated (06/15/2024)  Tobacco Use: Medium Risk (06/15/2024)    Readmission Risk Interventions    06/15/2024    1:35 PM  Readmission Risk Prevention Plan  Transportation Screening Complete  PCP or Specialist Appt within 5-7 Days Complete  Home Care Screening Complete  Medication Review (RN CM) Complete
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
## 2024-06-16 NOTE — Plan of Care (Signed)
  Problem: Clinical Measurements: Goal: Will remain free from infection Outcome: Progressing   Problem: Coping: Goal: Level of anxiety will decrease Outcome: Progressing   Problem: Pain Managment: Goal: General experience of comfort will improve and/or be controlled Outcome: Progressing

## 2024-06-16 NOTE — Progress Notes (Signed)
 [facility] Liaison  Met with wife, [name], at bedside to offer revocation of the Hospice Medicare Benefit to allow Medicare to pay for hospitalization while inpatient care management seeks LTC placement as current facility has stated they will not accept patient back without 24 hours sitters which spouse shares she cannot financially afford.   Revocation paperwork signed. Verbal order received from Dr. Maree to follow patient for outpatient palliative services.  We will continue to follow and assist with discharge disposition.  Please call with any questions or concerns.  Thank you, Randine Nail, BSN, [facility]  [phone]

## 2024-06-17 DIAGNOSIS — Z781 Physical restraint status: Secondary | ICD-10-CM | POA: Diagnosis not present

## 2024-06-17 DIAGNOSIS — F05 Delirium due to known physiological condition: Secondary | ICD-10-CM | POA: Diagnosis not present

## 2024-06-17 DIAGNOSIS — G309 Alzheimer's disease, unspecified: Secondary | ICD-10-CM | POA: Diagnosis not present

## 2024-06-17 DIAGNOSIS — I10 Essential (primary) hypertension: Secondary | ICD-10-CM | POA: Diagnosis not present

## 2024-06-17 DIAGNOSIS — E785 Hyperlipidemia, unspecified: Secondary | ICD-10-CM | POA: Diagnosis not present

## 2024-06-17 DIAGNOSIS — S61459A Open bite of unspecified hand, initial encounter: Secondary | ICD-10-CM | POA: Diagnosis not present

## 2024-06-17 DIAGNOSIS — F32A Depression, unspecified: Secondary | ICD-10-CM | POA: Diagnosis present

## 2024-06-17 DIAGNOSIS — N390 Urinary tract infection, site not specified: Secondary | ICD-10-CM

## 2024-06-17 DIAGNOSIS — Z79899 Other long term (current) drug therapy: Secondary | ICD-10-CM | POA: Diagnosis not present

## 2024-06-17 DIAGNOSIS — Z87891 Personal history of nicotine dependence: Secondary | ICD-10-CM | POA: Diagnosis not present

## 2024-06-17 DIAGNOSIS — F02818 Dementia in other diseases classified elsewhere, unspecified severity, with other behavioral disturbance: Secondary | ICD-10-CM | POA: Diagnosis not present

## 2024-06-17 DIAGNOSIS — W503XXA Accidental bite by another person, initial encounter: Secondary | ICD-10-CM | POA: Diagnosis not present

## 2024-06-17 DIAGNOSIS — Z8546 Personal history of malignant neoplasm of prostate: Secondary | ICD-10-CM | POA: Diagnosis not present

## 2024-06-17 DIAGNOSIS — Z79890 Hormone replacement therapy (postmenopausal): Secondary | ICD-10-CM | POA: Diagnosis not present

## 2024-06-17 DIAGNOSIS — Z9079 Acquired absence of other genital organ(s): Secondary | ICD-10-CM | POA: Diagnosis not present

## 2024-06-17 DIAGNOSIS — E039 Hypothyroidism, unspecified: Secondary | ICD-10-CM | POA: Diagnosis not present

## 2024-06-17 DIAGNOSIS — Z23 Encounter for immunization: Secondary | ICD-10-CM | POA: Diagnosis not present

## 2024-06-17 DIAGNOSIS — K5909 Other constipation: Secondary | ICD-10-CM | POA: Diagnosis not present

## 2024-06-17 DIAGNOSIS — F01C0 Vascular dementia, severe, without behavioral disturbance, psychotic disturbance, mood disturbance, and anxiety: Secondary | ICD-10-CM | POA: Diagnosis not present

## 2024-06-17 DIAGNOSIS — F0284 Dementia in other diseases classified elsewhere, unspecified severity, with anxiety: Secondary | ICD-10-CM | POA: Diagnosis present

## 2024-06-17 DIAGNOSIS — Y92128 Other place in nursing home as the place of occurrence of the external cause: Secondary | ICD-10-CM | POA: Diagnosis not present

## 2024-06-17 DIAGNOSIS — Z820 Family history of epilepsy and other diseases of the nervous system: Secondary | ICD-10-CM | POA: Diagnosis not present

## 2024-06-17 DIAGNOSIS — Z8249 Family history of ischemic heart disease and other diseases of the circulatory system: Secondary | ICD-10-CM | POA: Diagnosis not present

## 2024-06-17 DIAGNOSIS — N400 Enlarged prostate without lower urinary tract symptoms: Secondary | ICD-10-CM | POA: Diagnosis present

## 2024-06-17 DIAGNOSIS — F02811 Dementia in other diseases classified elsewhere, unspecified severity, with agitation: Secondary | ICD-10-CM | POA: Diagnosis present

## 2024-06-17 DIAGNOSIS — F0283 Dementia in other diseases classified elsewhere, unspecified severity, with mood disturbance: Secondary | ICD-10-CM | POA: Diagnosis present

## 2024-06-17 DIAGNOSIS — N3000 Acute cystitis without hematuria: Secondary | ICD-10-CM | POA: Diagnosis not present

## 2024-06-17 DIAGNOSIS — Z66 Do not resuscitate: Secondary | ICD-10-CM | POA: Diagnosis present

## 2024-06-17 DIAGNOSIS — B9620 Unspecified Escherichia coli [E. coli] as the cause of diseases classified elsewhere: Secondary | ICD-10-CM | POA: Diagnosis present

## 2024-06-17 DIAGNOSIS — Z87448 Personal history of other diseases of urinary system: Secondary | ICD-10-CM | POA: Diagnosis not present

## 2024-06-17 LAB — URINE CULTURE: Culture: >=100000 — AB

## 2024-06-17 MED ORDER — HALOPERIDOL LACTATE 5 MG/ML IJ SOLN
2.0000 mg | Freq: Three times a day (TID) | INTRAMUSCULAR | Status: DC | PRN
Start: 2024-06-17 — End: 2024-06-26
  Administered 2024-06-18 – 2024-06-24 (×5): 2 mg via INTRAMUSCULAR
  Filled 2024-06-17 (×6): qty 1

## 2024-06-17 MED ORDER — TRAZODONE HCL 100 MG PO TABS
100.0000 mg | ORAL_TABLET | Freq: Every day | ORAL | Status: DC
  Administered 2024-06-17 – 2024-06-20 (×4): 100 mg via ORAL
  Filled 2024-06-17 (×4): qty 1

## 2024-06-17 MED ORDER — LORAZEPAM 0.5 MG PO TABS
0.5000 mg | ORAL_TABLET | Freq: Three times a day (TID) | ORAL | Status: DC | PRN
Start: 2024-06-17 — End: 2024-06-19
  Administered 2024-06-17 – 2024-06-19 (×2): 0.5 mg via ORAL
  Filled 2024-06-17 (×2): qty 1

## 2024-06-17 MED ORDER — OLANZAPINE 5 MG PO TABS
5.0000 mg | ORAL_TABLET | Freq: Every day | ORAL | Status: DC
  Administered 2024-06-17 – 2024-06-20 (×4): 5 mg via ORAL
  Filled 2024-06-17 (×4): qty 1

## 2024-06-17 NOTE — Plan of Care (Signed)
  Problem: Education: Goal: Knowledge of General Education information will improve Description: Including pain rating scale, medication(s)/side effects and non-pharmacologic comfort measures Outcome: Progressing   Problem: Health Behavior/Discharge Planning: Goal: Ability to manage health-related needs will improve Outcome: Progressing   Problem: Clinical Measurements: Goal: Ability to maintain clinical measurements within normal limits will improve Outcome: Progressing Goal: Will remain free from infection Outcome: Progressing Goal: Diagnostic test results will improve Outcome: Progressing   Problem: Activity: Goal: Risk for activity intolerance will decrease Outcome: Progressing   Problem: Nutrition: Goal: Adequate nutrition will be maintained Outcome: Progressing   Problem: Elimination: Goal: Will not experience complications related to bowel motility Outcome: Progressing Goal: Will not experience complications related to urinary retention Outcome: Progressing   Problem: Pain Managment: Goal: General experience of comfort will improve and/or be controlled Outcome: Progressing   Problem: Safety: Goal: Ability to remain free from injury will improve Outcome: Progressing   Problem: Skin Integrity: Goal: Risk for impaired skin integrity will decrease Outcome: Progressing
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
## 2024-06-17 NOTE — Plan of Care (Signed)
°  Problem: Education: Goal: Knowledge of General Education information will improve Description: Including pain rating scale, medication(s)/side effects and non-pharmacologic comfort measures Outcome: Progressing   Problem: Health Behavior/Discharge Planning: Goal: Ability to manage health-related needs will improve Outcome: Progressing   Problem: Clinical Measurements: Goal: Ability to maintain clinical measurements within normal limits will improve Outcome: Progressing Goal: Will remain free from infection Outcome: Progressing Goal: Diagnostic test results will improve Outcome: Progressing   Problem: Activity: Goal: Risk for activity intolerance will decrease Outcome: Progressing   Problem: Nutrition: Goal: Adequate nutrition will be maintained Outcome: Progressing   Problem: Coping: Goal: Level of anxiety will decrease Outcome: Progressing   Problem: Elimination: Goal: Will not experience complications related to bowel motility Outcome: Progressing   

## 2024-06-18 DIAGNOSIS — F02818 Dementia in other diseases classified elsewhere, unspecified severity, with other behavioral disturbance: Secondary | ICD-10-CM | POA: Diagnosis not present

## 2024-06-18 DIAGNOSIS — F01C0 Vascular dementia, severe, without behavioral disturbance, psychotic disturbance, mood disturbance, and anxiety: Secondary | ICD-10-CM

## 2024-06-18 DIAGNOSIS — Z87448 Personal history of other diseases of urinary system: Secondary | ICD-10-CM

## 2024-06-18 DIAGNOSIS — F05 Delirium due to known physiological condition: Secondary | ICD-10-CM

## 2024-06-18 DIAGNOSIS — I10 Essential (primary) hypertension: Secondary | ICD-10-CM | POA: Diagnosis not present

## 2024-06-18 DIAGNOSIS — E785 Hyperlipidemia, unspecified: Secondary | ICD-10-CM

## 2024-06-18 DIAGNOSIS — G309 Alzheimer's disease, unspecified: Secondary | ICD-10-CM | POA: Diagnosis not present

## 2024-06-18 DIAGNOSIS — W503XXA Accidental bite by another person, initial encounter: Secondary | ICD-10-CM | POA: Diagnosis not present

## 2024-06-18 DIAGNOSIS — S61459A Open bite of unspecified hand, initial encounter: Secondary | ICD-10-CM | POA: Diagnosis not present

## 2024-06-18 LAB — BASIC METABOLIC PANEL WITH GFR
Anion gap: 9 (ref 5–15)
BUN: 22 mg/dL (ref 8–23)
CO2: 28 mmol/L (ref 22–32)
Calcium: 9.2 mg/dL (ref 8.9–10.3)
Chloride: 104 mmol/L (ref 98–111)
Creatinine, Ser: 0.88 mg/dL (ref 0.61–1.24)
GFR, Estimated: >60 mL/min (ref >60)
Glucose, Bld: 123 mg/dL — ABNORMAL HIGH (ref 70–99)
Potassium: 4.3 mmol/L (ref 3.5–5.1)
Sodium: 141 mmol/L (ref 135–145)

## 2024-06-18 LAB — CK: Total CK: 66 U/L (ref 49–397)
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
## 2024-06-18 NOTE — Plan of Care (Signed)
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
## 2024-06-19 DIAGNOSIS — F05 Delirium due to known physiological condition: Secondary | ICD-10-CM | POA: Diagnosis not present

## 2024-06-19 DIAGNOSIS — G309 Alzheimer's disease, unspecified: Secondary | ICD-10-CM | POA: Diagnosis not present

## 2024-06-19 DIAGNOSIS — I10 Essential (primary) hypertension: Secondary | ICD-10-CM | POA: Diagnosis not present

## 2024-06-19 DIAGNOSIS — F02818 Dementia in other diseases classified elsewhere, unspecified severity, with other behavioral disturbance: Secondary | ICD-10-CM | POA: Diagnosis not present
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
## 2024-06-19 NOTE — TOC Progression Note (Signed)
 Transition of Care [facility]) - Progression Note    Patient Details  Name: Julian [name] MRN: [id number] Date of Birth: [birth year]  [facility] CM/SW Contact  Alfonse JONELLE Rex, RN Phone Number: 06/19/2024, 4:07 PM  Clinical Narrative:  Patient being followed by  Family Nurse Psychiatry-continue delirium precautions, maintain medical management of UTI, now has a tele sitter for agitation. TOC will continue to follow.    Expected Discharge Plan: Hospice Medical Facility Barriers to Discharge: Continued Medical Work up               Expected Discharge Plan and Services     Post Acute Care Choice: Skilled Nursing Facility [facility]) Living arrangements for the past 2 months: Skilled Nursing Facility [facility])                                       Social Drivers of Health (SDOH) Interventions SDOH Screenings   Food Insecurity: No Food Insecurity (06/15/2024)  Housing: Low Risk  (06/15/2024)  Transportation Needs: No Transportation Needs (06/15/2024)  Utilities: Not At Risk (06/15/2024)  Social Connections: Moderately Isolated (06/15/2024)  Tobacco Use: Medium Risk (06/15/2024)    Readmission Risk Interventions    06/15/2024    1:35 PM  Readmission Risk Prevention Plan  Transportation Screening Complete  PCP or Specialist Appt within 5-7 Days Complete  Home Care Screening Complete  Medication Review (RN CM) Complete
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
## 2024-06-19 NOTE — TOC Progression Note (Signed)
 Transition of Care [facility] - Progression Note    Patient Details  Name: Julian [name] MRN: [id number] Date of Birth: 07-05-1926  Transition of Care [facility]) CM/SW Contact  Alfonse JONELLE Rex, RN Phone Number: 06/19/2024, 12:33 PM  Clinical Narrative:   Call received from April with [facility], provided medical update and reviewed dc options. Confirmed patient is non service connected, therefore he does not qualify for LTC benefits. [place], [facility] in [place], [place], phone [phone]; and [facility] [phone] accepts non service connected veterans for LTC. [facility] in [place], phone [phone].     Expected Discharge Plan: Hospice Medical Facility Barriers to Discharge: Continued Medical Work up               Expected Discharge Plan and Services     Post Acute Care Choice: Skilled Nursing Facility [facility]) Living arrangements for the past 2 months: Skilled Nursing Facility [facility])                                       Social Drivers of Health (SDOH) Interventions SDOH Screenings   Food Insecurity: No Food Insecurity (06/15/2024)  Housing: Low Risk  (06/15/2024)  Transportation Needs: No Transportation Needs (06/15/2024)  Utilities: Not At Risk (06/15/2024)  Social Connections: Moderately Isolated (06/15/2024)  Tobacco Use: Medium Risk (06/15/2024)    Readmission Risk Interventions    06/15/2024    1:35 PM  Readmission Risk Prevention Plan  Transportation Screening Complete  PCP or Specialist Appt within 5-7 Days Complete  Home Care Screening Complete  Medication Review (RN CM) Complete
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
## 2024-06-20 DIAGNOSIS — W503XXA Accidental bite by another person, initial encounter: Secondary | ICD-10-CM | POA: Diagnosis not present

## 2024-06-20 DIAGNOSIS — S61459A Open bite of unspecified hand, initial encounter: Secondary | ICD-10-CM | POA: Diagnosis not present

## 2024-06-20 NOTE — TOC Progression Note (Addendum)
 Transition of Care [facility]) - Progression Note    Patient Details  Name: Julian [name] MRN: [id number] Date of Birth: [birth year]  Transition of Care [facility]) CM/SW Contact  Alfonse JONELLE Rex, RN Phone Number: 06/20/2024, 2:58 PM  Clinical Narrative:   Call to [facility] on [place], [place], at [phone], transferred to Admission Department, vm left for Admissions Director, Chiquita Lunger, with NCM name and phone number requesting call back.   -3:31pm Call to [facility] in [phone], spoke with Niels, provided Niels medical reason for current inpatient admission. Niels states they do not have a memory care unit at their facility andt their rooms are all semi-private, patient would not qualify for admission based on his diagnosis.   3:50 pm Teams chat sent to team, including Psych team - informed patient has [facility] as his primary insurance. I was recently notified that the [place] has a [facility] in [place] and [place] that provide LTC.  waiting on call back from Admissions Coordinator at [facility] . [facility] only has semi private rooms, no memory care, can not accept patients with aggression/agitation behaviors. patient  mobile with no assistive device, feel sure would not meet criteria for SNF. Asked team if they felt patient would patient benefit from Gero-psych placement? TOC will continue to follow.    -4:00pm  Call to [facility], admissions coordinator, at [facility], provided update on patient's medical status and that patient's medication is being managed to control agitation behaviors.SABRA Knee states patient is welcome to return to [facility] but will need 24 hour sitter for the first week, states wife is welcome to be the sitter and that wife was provided with resources for sitter agencies. NCM will plan to meet with wife at bedside to review her goals for discharge.   Expected Discharge Plan: Hospice Medical Facility Barriers  to Discharge: Continued Medical Work up               Expected Discharge Plan and Services     Post Acute Care Choice: Skilled Nursing Facility [facility]) Living arrangements for the past 2 months: Skilled Nursing Facility [facility])                                       Social Drivers of Health (SDOH) Interventions SDOH Screenings   Food Insecurity: No Food Insecurity (06/15/2024)  Housing: Low Risk  (06/15/2024)  Transportation Needs: No Transportation Needs (06/15/2024)  Utilities: Not At Risk (06/15/2024)  Social Connections: Moderately Isolated (06/15/2024)  Tobacco Use: Medium Risk (06/15/2024)    Readmission Risk Interventions    06/15/2024    1:35 PM  Readmission Risk Prevention Plan  Transportation Screening Complete  PCP or Specialist Appt within 5-7 Days Complete  Home Care Screening Complete  Medication Review (RN CM) Complete
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
## 2024-06-21 DIAGNOSIS — S61459A Open bite of unspecified hand, initial encounter: Secondary | ICD-10-CM | POA: Diagnosis not present

## 2024-06-21 DIAGNOSIS — W503XXA Accidental bite by another person, initial encounter: Secondary | ICD-10-CM | POA: Diagnosis not present

## 2024-06-21 MED ORDER — TRAZODONE HCL 50 MG PO TABS
150.0000 mg | ORAL_TABLET | Freq: Every day | ORAL | Status: DC
  Administered 2024-06-21: 150 mg via ORAL
  Filled 2024-06-21: qty 1

## 2024-06-21 MED ORDER — OLANZAPINE 10 MG PO TABS
10.0000 mg | ORAL_TABLET | Freq: Every day | ORAL | Status: DC
  Administered 2024-06-21 – 2024-06-24 (×4): 10 mg via ORAL
  Filled 2024-06-21 (×4): qty 1

## 2024-06-21 NOTE — TOC Progression Note (Signed)
 Transition of Care [facility]) - Progression Note    Patient Details  Name: Julian [name] MRN: [id number] Date of Birth: 02-01-1926  Transition of Care [facility]) CM/SW Contact  Alfonse JONELLE Rex, RN Phone Number: 06/21/2024, 1:54 PM  Clinical Narrative:   -9:00 am Met with spouse this morning at bedside to discuss dc plan/disposition. She voiced concerns about patient's quality of life and feels is not right to let him continue as he is . She again expressed interest in inpatient hospice at [facility]. Wife is requesting a visit from [facility] to revisit the possibility, Teams chat sent attending, Family Nurse Psychiatry , and [facility] hospital Liaison informing spouse would like to discuss revisiting inpatient hospice.      Expected Discharge Plan: Hospice Medical Facility Barriers to Discharge: Continued Medical Work up               Expected Discharge Plan and Services     Post Acute Care Choice: Skilled Nursing Facility [facility]) Living arrangements for the past 2 months: Skilled Nursing Facility [facility])                                       Social Drivers of Health (SDOH) Interventions SDOH Screenings   Food Insecurity: No Food Insecurity (06/15/2024)  Housing: Low Risk  (06/15/2024)  Transportation Needs: No Transportation Needs (06/15/2024)  Utilities: Not At Risk (06/15/2024)  Social Connections: Moderately Isolated (06/15/2024)  Tobacco Use: Medium Risk (06/15/2024)    Readmission Risk Interventions    06/15/2024    1:35 PM  Readmission Risk Prevention Plan  Transportation Screening Complete  PCP or Specialist Appt within 5-7 Days Complete  Home Care Screening Complete  Medication Review (RN CM) Complete
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
## 2024-06-22 DIAGNOSIS — S61459A Open bite of unspecified hand, initial encounter: Secondary | ICD-10-CM | POA: Diagnosis not present

## 2024-06-22 DIAGNOSIS — W503XXA Accidental bite by another person, initial encounter: Secondary | ICD-10-CM | POA: Diagnosis not present

## 2024-06-22 MED ORDER — TRAZODONE HCL 100 MG PO TABS
200.0000 mg | ORAL_TABLET | Freq: Every day | ORAL | Status: DC
  Administered 2024-06-22 – 2024-06-25 (×4): 200 mg via ORAL
  Filled 2024-06-22 (×4): qty 2

## 2024-06-22 NOTE — TOC Progression Note (Addendum)
 Transition of Care [facility]) - Progression Note    Patient Details  Name: Julian [name] MRN: [id number] Date of Birth: 01-16-1926  Transition of Care [facility]) CM/SW Contact  Alfonse JONELLE Rex, RN Phone Number: 06/22/2024, 10:38 AM  Clinical Narrative:   Teams chat from [place], hospital liaison with [facility]-  met in room with patient and wife, he doesn't currently meet criteria for the [facility] and wife is aware and understanding of this.  IP Care Mgmt with continue to follow for dc planning.    -2:30pm Met with spouse at bedside to review dc planning, she states plan is for patient to return to [facility] care with sitter. Sitter resources provided to spouse ( [facility], [facility]), states she will investigate for services and cost. IP Care Management will follow up.     Expected Discharge Plan: Hospice Medical Facility Barriers to Discharge: Continued Medical Work up               Expected Discharge Plan and Services     Post Acute Care Choice: Skilled Nursing Facility [facility]) Living arrangements for the past 2 months: Skilled Nursing Facility [facility])                                       Social Drivers of Health (SDOH) Interventions SDOH Screenings   Food Insecurity: No Food Insecurity (06/15/2024)  Housing: Low Risk  (06/15/2024)  Transportation Needs: No Transportation Needs (06/15/2024)  Utilities: Not At Risk (06/15/2024)  Social Connections: Moderately Isolated (06/15/2024)  Tobacco Use: Medium Risk (06/15/2024)    Readmission Risk Interventions    06/15/2024    1:35 PM  Readmission Risk Prevention Plan  Transportation Screening Complete  PCP or Specialist Appt within 5-7 Days Complete  Home Care Screening Complete  Medication Review (RN CM) Complete
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
## 2024-06-23 DIAGNOSIS — W503XXA Accidental bite by another person, initial encounter: Secondary | ICD-10-CM | POA: Diagnosis not present

## 2024-06-23 DIAGNOSIS — S61459A Open bite of unspecified hand, initial encounter: Secondary | ICD-10-CM | POA: Diagnosis not present

## 2024-06-23 NOTE — Plan of Care (Signed)
  Problem: Activity: Goal: Risk for activity intolerance will decrease Outcome: Progressing   Problem: Coping: Goal: Level of anxiety will decrease Outcome: Progressing   Problem: Elimination: Goal: Will not experience complications related to bowel motility Outcome: Progressing Goal: Will not experience complications related to urinary retention Outcome: Progressing   Problem: Pain Managment: Goal: General experience of comfort will improve and/or be controlled Outcome: Progressing   Problem: Safety: Goal: Ability to remain free from injury will improve Outcome: Progressing   Problem: Skin Integrity: Goal: Risk for impaired skin integrity will decrease Outcome: Progressing

## 2024-06-23 NOTE — Progress Notes (Signed)
 Pt showed yellow mews for BP 80/62. MD notified. No new order at this time. Will continue to monitor vital signs per yellow mews protocol. Pt alert to self with confusion. Calm and cooperative this morning. Refused pain. Pt has poor appetite and low fluid intake. With encouragement pt ate 40% breakfast. Assisted pt to ambulate on the hallway with pt request. Pt refused dizziness. Wife and sitter at bedside. Bed low and locked. Call bell in reach.
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
## 2024-06-24 DIAGNOSIS — S61459A Open bite of unspecified hand, initial encounter: Secondary | ICD-10-CM | POA: Diagnosis not present

## 2024-06-24 DIAGNOSIS — W503XXA Accidental bite by another person, initial encounter: Secondary | ICD-10-CM | POA: Diagnosis not present
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
## 2024-06-24 NOTE — Plan of Care (Signed)
  Problem: Education: Goal: Knowledge of General Education information will improve Description: Including pain rating scale, medication(s)/side effects and non-pharmacologic comfort measures 06/24/2024 0321 by Vicci Delon PARAS, RN Outcome: Progressing 06/24/2024 0321 by Vicci Delon PARAS, RN Outcome: Progressing   Problem: Health Behavior/Discharge Planning: Goal: Ability to manage health-related needs will improve Outcome: Progressing   Problem: Clinical Measurements: Goal: Ability to maintain clinical measurements within normal limits will improve Outcome: Progressing Goal: Will remain free from infection Outcome: Progressing Goal: Diagnostic test results will improve Outcome: Progressing   Problem: Activity: Goal: Risk for activity intolerance will decrease Outcome: Progressing   Problem: Nutrition: Goal: Adequate nutrition will be maintained Outcome: Progressing   Problem: Coping: Goal: Level of anxiety will decrease Outcome: Progressing   Problem: Elimination: Goal: Will not experience complications related to bowel motility Outcome: Progressing Goal: Will not experience complications related to urinary retention Outcome: Progressing   Problem: Pain Managment: Goal: General experience of comfort will improve and/or be controlled Outcome: Progressing   Problem: Safety: Goal: Ability to remain free from injury will improve Outcome: Progressing   Problem: Skin Integrity: Goal: Risk for impaired skin integrity will decrease Outcome: Progressing

## 2024-06-25 DIAGNOSIS — W503XXA Accidental bite by another person, initial encounter: Secondary | ICD-10-CM | POA: Diagnosis not present

## 2024-06-25 DIAGNOSIS — S61459A Open bite of unspecified hand, initial encounter: Secondary | ICD-10-CM | POA: Diagnosis not present

## 2024-06-25 MED ORDER — OLANZAPINE 5 MG PO TABS
15.0000 mg | ORAL_TABLET | Freq: Every day | ORAL | Status: DC
  Administered 2024-06-25: 15 mg via ORAL
  Filled 2024-06-25: qty 1

## 2024-06-25 NOTE — Progress Notes (Signed)
 Patient attempting to climb out of bed and restless. Messaged Lynwood Kipper to renew sitter order.
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
## 2024-06-25 NOTE — Plan of Care (Signed)
  Problem: Clinical Measurements: Goal: Ability to maintain clinical measurements within normal limits will improve Outcome: Progressing Goal: Will remain free from infection Outcome: Progressing Goal: Diagnostic test results will improve Outcome: Progressing   Problem: Activity: Goal: Risk for activity intolerance will decrease Outcome: Progressing   Problem: Nutrition: Goal: Adequate nutrition will be maintained Outcome: Progressing   Problem: Coping: Goal: Level of anxiety will decrease Outcome: Progressing   Problem: Elimination: Goal: Will not experience complications related to bowel motility Outcome: Progressing Goal: Will not experience complications related to urinary retention Outcome: Progressing   Problem: Pain Managment: Goal: General experience of comfort will improve and/or be controlled Outcome: Progressing   Problem: Safety: Goal: Ability to remain free from injury will improve Outcome: Progressing   Problem: Skin Integrity: Goal: Risk for impaired skin integrity will decrease Outcome: Progressing

## 2024-06-26 ENCOUNTER — Other Ambulatory Visit (HOSPITAL_COMMUNITY): Payer: Self-pay

## 2024-06-26 ENCOUNTER — Other Ambulatory Visit: Payer: Self-pay

## 2024-06-26 DIAGNOSIS — S61459A Open bite of unspecified hand, initial encounter: Secondary | ICD-10-CM | POA: Diagnosis not present

## 2024-06-26 DIAGNOSIS — W503XXA Accidental bite by another person, initial encounter: Secondary | ICD-10-CM | POA: Diagnosis not present

## 2024-06-26 MED ORDER — LORAZEPAM 0.5 MG PO TABS
0.5000 mg | ORAL_TABLET | Freq: Three times a day (TID) | ORAL | 0 refills | Status: DC | PRN
  Filled 2024-06-26 (×2): qty 20, 7d supply, fill #0

## 2024-06-26 MED ORDER — ACETAMINOPHEN 500 MG PO TABS: 500.0000 mg | ORAL_TABLET | Freq: Four times a day (QID) | ORAL | Status: DC | PRN

## 2024-06-26 MED ORDER — TRAZODONE HCL 100 MG PO TABS
200.0000 mg | ORAL_TABLET | Freq: Every day | ORAL | 0 refills | Status: DC
  Filled 2024-06-26 (×2): qty 30, 15d supply, fill #0

## 2024-06-26 MED ORDER — LORAZEPAM 0.5 MG PO TABS: 0.5000 mg | ORAL_TABLET | Freq: Three times a day (TID) | ORAL | 0 refills | Status: DC | PRN

## 2024-06-26 MED ORDER — OXYCODONE HCL 5 MG PO TABS
5.0000 mg | ORAL_TABLET | Freq: Four times a day (QID) | ORAL | 0 refills | Status: DC | PRN
  Filled 2024-06-26 (×2): qty 20, 5d supply, fill #0

## 2024-06-26 MED ORDER — OLANZAPINE 5 MG PO TABS
5.0000 mg | ORAL_TABLET | Freq: Every day | ORAL | 0 refills | Status: DC
  Filled 2024-06-26: qty 30, 30d supply, fill #0

## 2024-06-26 MED ORDER — HALOPERIDOL 1 MG PO TABS
2.0000 mg | ORAL_TABLET | Freq: Four times a day (QID) | ORAL | 0 refills | Status: DC | PRN
  Filled 2024-06-26 (×2): qty 60, 8d supply, fill #0

## 2024-06-26 MED ORDER — OLANZAPINE 10 MG PO TABS: 10.0000 mg | ORAL_TABLET | Freq: Every day | ORAL | 0 refills | Status: DC

## 2024-06-26 MED ORDER — OXYCODONE HCL 5 MG PO TABS: 5.0000 mg | ORAL_TABLET | Freq: Four times a day (QID) | ORAL | 0 refills | Status: DC | PRN

## 2024-06-26 MED ORDER — OLANZAPINE 5 MG PO TABS: 5.0000 mg | ORAL_TABLET | Freq: Every day | ORAL | 0 refills | Status: DC

## 2024-06-26 MED ORDER — TRAZODONE HCL 100 MG PO TABS: 200.0000 mg | ORAL_TABLET | Freq: Every day | ORAL | 0 refills | Status: DC

## 2024-06-26 MED ORDER — OLANZAPINE 10 MG PO TABS
10.0000 mg | ORAL_TABLET | Freq: Every day | ORAL | 0 refills | Status: DC
  Filled 2024-06-26 (×2): qty 30, 30d supply, fill #0

## 2024-06-26 MED ORDER — HALOPERIDOL 1 MG PO TABS: 2.0000 mg | ORAL_TABLET | Freq: Four times a day (QID) | ORAL | 0 refills | Status: DC | PRN

## 2024-06-26 NOTE — Progress Notes (Signed)
 Discharge medications delivered to patient at the bedside in a secure bag.

## 2024-06-26 NOTE — TOC Transition Note (Signed)
 Transition of Care [facility]) - Discharge Note   Patient Details  Name: Julian [name] MRN: [id number] Date of Birth: June 30, 1926  Transition of Care [facility]) CM/SW Contact:  Alfonse JONELLE Rex, RN Phone Number: 06/26/2024, 1:17 PM   Clinical Narrative:   Met with spouse o      Barriers to Discharge: Continued Medical Work up   Patient Goals and CMS Choice Patient states their goals for this hospitalization and ongoing recovery are:: patient resides at [facility], will need 24 hr sitter if returns, patient being followed by [facility] for Home Hospice, possible dc to [email or web address] Compare Post Acute Care list provided to:: Patient Represenative (must comment) Corley, Maffeo (Spouse)  [phone] [facility] Phone)) Choice offered to / list presented to : Spouse [place] ownership interest in [facility].provided to:: Spouse    Discharge Placement                       Discharge Plan and Services Additional resources added to the After Visit Summary for       Post Acute Care Choice: Skilled Nursing Facility [facility])                               Social Drivers of Health (SDOH) Interventions SDOH Screenings   Food Insecurity: No Food Insecurity (06/15/2024)  Housing: Low Risk  (06/15/2024)  Transportation Needs: No Transportation Needs (06/15/2024)  Utilities: Not At Risk (06/15/2024)  Social Connections: Moderately Isolated (06/15/2024)  Tobacco Use: Medium Risk (06/15/2024)     Readmission Risk Interventions    06/15/2024    1:35 PM  Readmission Risk Prevention Plan  Transportation Screening Complete  PCP or Specialist Appt within 5-7 Days Complete  Home Care Screening Complete  Medication Review (RN CM) Complete

## 2024-06-26 NOTE — Progress Notes (Signed)
 Discharge instructions given to patients wife and all questions answered.
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
## 2024-06-26 NOTE — TOC Transition Note (Signed)
 Transition of Care [facility]) - Discharge Note   Patient Details  Name: Julian [name] MRN: [id number] Date of Birth: Oct 21, 1925  [facility]) CM/SW Contact:  Alfonse JONELLE Rex, RN Phone Number: 06/26/2024, 3:03 PM   Clinical Narrative:   DC to [facility], spouse states she has arranged for sitter ( she and a hired engineer, civil (consulting) will be the sitter per spouse). FL2 and DC Summary faxed to [facility] at [phone]. Call to [facility] w/facility, confirmed she received FL2 and DC Summary, patient is clear to return. Spouse to transport patient in private vehicle. No further TOC needs identified at this time.     Final next level of care: Home w Hospice Care Barriers to Discharge: Barriers Resolved   Patient Goals and CMS Choice Patient states their goals for this hospitalization and ongoing recovery are:: patient resides at [facility], will need 24 hr sitter if returns, patient being followed by [facility] for Home Hospice, possible dc to [facility].gov Compare Post Acute Care list provided to:: Patient Represenative (must comment) Julian [name], Julian [name] (Spouse)  [phone] [facility] Phone)) Choice offered to / list presented to : Spouse [place] ownership interest in [facility].provided to:: Spouse    Discharge Placement                       Discharge Plan and Services Additional resources added to the After Visit Summary for       Post Acute Care Choice: Skilled Nursing Facility [facility] Memory [facility])                               Social Drivers of Health (SDOH) Interventions SDOH Screenings   Food Insecurity: No Food Insecurity (06/15/2024)  Housing: Low Risk  (06/15/2024)  Transportation Needs: No Transportation Needs (06/15/2024)  Utilities: Not At Risk (06/15/2024)  Social Connections: Moderately Isolated (06/15/2024)  Tobacco Use: Medium Risk (06/15/2024)     Readmission Risk  Interventions    06/26/2024    3:01 PM 06/15/2024    1:35 PM  Readmission Risk Prevention Plan  Transportation Screening Complete Complete  PCP or Specialist Appt within 5-7 Days Complete Complete  Home Care Screening Complete Complete  Medication Review (RN CM) Complete Complete

## 2024-06-27 DIAGNOSIS — E039 Hypothyroidism, unspecified: Secondary | ICD-10-CM | POA: Diagnosis not present

## 2024-06-27 DIAGNOSIS — C6991 Malignant neoplasm of unspecified site of right eye: Secondary | ICD-10-CM | POA: Diagnosis not present

## 2024-06-27 DIAGNOSIS — C6992 Malignant neoplasm of unspecified site of left eye: Secondary | ICD-10-CM | POA: Diagnosis not present

## 2024-06-27 DIAGNOSIS — J302 Other seasonal allergic rhinitis: Secondary | ICD-10-CM | POA: Diagnosis not present

## 2024-06-27 DIAGNOSIS — K523 Indeterminate colitis: Secondary | ICD-10-CM | POA: Diagnosis not present

## 2024-06-27 DIAGNOSIS — N401 Enlarged prostate with lower urinary tract symptoms: Secondary | ICD-10-CM | POA: Diagnosis not present

## 2024-06-27 DIAGNOSIS — K5900 Constipation, unspecified: Secondary | ICD-10-CM | POA: Diagnosis not present

## 2024-06-27 DIAGNOSIS — I2510 Atherosclerotic heart disease of native coronary artery without angina pectoris: Secondary | ICD-10-CM | POA: Diagnosis not present

## 2024-06-27 DIAGNOSIS — E785 Hyperlipidemia, unspecified: Secondary | ICD-10-CM | POA: Diagnosis not present

## 2024-06-27 DIAGNOSIS — F01C18 Vascular dementia, severe, with other behavioral disturbance: Secondary | ICD-10-CM | POA: Diagnosis not present

## 2024-06-27 DIAGNOSIS — I10 Essential (primary) hypertension: Secondary | ICD-10-CM | POA: Diagnosis not present

## 2024-06-27 DIAGNOSIS — R911 Solitary pulmonary nodule: Secondary | ICD-10-CM | POA: Diagnosis not present

## 2024-06-28 DIAGNOSIS — I10 Essential (primary) hypertension: Secondary | ICD-10-CM | POA: Diagnosis not present

## 2024-06-28 DIAGNOSIS — F01C18 Vascular dementia, severe, with other behavioral disturbance: Secondary | ICD-10-CM | POA: Diagnosis not present

## 2024-06-28 DIAGNOSIS — K523 Indeterminate colitis: Secondary | ICD-10-CM | POA: Diagnosis not present

## 2024-06-28 DIAGNOSIS — N401 Enlarged prostate with lower urinary tract symptoms: Secondary | ICD-10-CM | POA: Diagnosis not present

## 2024-06-28 DIAGNOSIS — I2510 Atherosclerotic heart disease of native coronary artery without angina pectoris: Secondary | ICD-10-CM | POA: Diagnosis not present

## 2024-06-28 DIAGNOSIS — E785 Hyperlipidemia, unspecified: Secondary | ICD-10-CM | POA: Diagnosis not present

## 2024-06-29 DIAGNOSIS — F01C18 Vascular dementia, severe, with other behavioral disturbance: Secondary | ICD-10-CM | POA: Diagnosis not present

## 2024-06-29 DIAGNOSIS — N401 Enlarged prostate with lower urinary tract symptoms: Secondary | ICD-10-CM | POA: Diagnosis not present

## 2024-06-29 DIAGNOSIS — I10 Essential (primary) hypertension: Secondary | ICD-10-CM | POA: Diagnosis not present

## 2024-06-29 DIAGNOSIS — K523 Indeterminate colitis: Secondary | ICD-10-CM | POA: Diagnosis not present

## 2024-06-29 DIAGNOSIS — E785 Hyperlipidemia, unspecified: Secondary | ICD-10-CM | POA: Diagnosis not present

## 2024-06-29 DIAGNOSIS — I2510 Atherosclerotic heart disease of native coronary artery without angina pectoris: Secondary | ICD-10-CM | POA: Diagnosis not present

## 2024-07-02 DIAGNOSIS — N401 Enlarged prostate with lower urinary tract symptoms: Secondary | ICD-10-CM | POA: Diagnosis not present

## 2024-07-02 DIAGNOSIS — K523 Indeterminate colitis: Secondary | ICD-10-CM | POA: Diagnosis not present

## 2024-07-02 DIAGNOSIS — F01C18 Vascular dementia, severe, with other behavioral disturbance: Secondary | ICD-10-CM | POA: Diagnosis not present

## 2024-07-02 DIAGNOSIS — E785 Hyperlipidemia, unspecified: Secondary | ICD-10-CM | POA: Diagnosis not present

## 2024-07-02 DIAGNOSIS — I2510 Atherosclerotic heart disease of native coronary artery without angina pectoris: Secondary | ICD-10-CM | POA: Diagnosis not present

## 2024-07-02 DIAGNOSIS — I10 Essential (primary) hypertension: Secondary | ICD-10-CM | POA: Diagnosis not present

## 2024-07-05 DIAGNOSIS — I10 Essential (primary) hypertension: Secondary | ICD-10-CM | POA: Diagnosis not present

## 2024-07-05 DIAGNOSIS — E785 Hyperlipidemia, unspecified: Secondary | ICD-10-CM | POA: Diagnosis not present

## 2024-07-05 DIAGNOSIS — K523 Indeterminate colitis: Secondary | ICD-10-CM | POA: Diagnosis not present

## 2024-07-05 DIAGNOSIS — F02C11 Dementia in other diseases classified elsewhere, severe, with agitation: Secondary | ICD-10-CM | POA: Diagnosis not present

## 2024-07-05 DIAGNOSIS — I2510 Atherosclerotic heart disease of native coronary artery without angina pectoris: Secondary | ICD-10-CM | POA: Diagnosis not present

## 2024-07-05 DIAGNOSIS — G300 Alzheimer's disease with early onset: Secondary | ICD-10-CM | POA: Diagnosis not present

## 2024-07-05 DIAGNOSIS — N401 Enlarged prostate with lower urinary tract symptoms: Secondary | ICD-10-CM | POA: Diagnosis not present

## 2024-07-05 DIAGNOSIS — F01C18 Vascular dementia, severe, with other behavioral disturbance: Secondary | ICD-10-CM | POA: Diagnosis not present

## 2024-07-06 DIAGNOSIS — I10 Essential (primary) hypertension: Secondary | ICD-10-CM | POA: Diagnosis not present

## 2024-07-06 DIAGNOSIS — F01C18 Vascular dementia, severe, with other behavioral disturbance: Secondary | ICD-10-CM | POA: Diagnosis not present

## 2024-07-06 DIAGNOSIS — K523 Indeterminate colitis: Secondary | ICD-10-CM | POA: Diagnosis not present

## 2024-07-06 DIAGNOSIS — I2510 Atherosclerotic heart disease of native coronary artery without angina pectoris: Secondary | ICD-10-CM | POA: Diagnosis not present

## 2024-07-06 DIAGNOSIS — E785 Hyperlipidemia, unspecified: Secondary | ICD-10-CM | POA: Diagnosis not present

## 2024-07-06 DIAGNOSIS — N401 Enlarged prostate with lower urinary tract symptoms: Secondary | ICD-10-CM | POA: Diagnosis not present

## 2024-07-09 DIAGNOSIS — E785 Hyperlipidemia, unspecified: Secondary | ICD-10-CM | POA: Diagnosis not present

## 2024-07-09 DIAGNOSIS — N401 Enlarged prostate with lower urinary tract symptoms: Secondary | ICD-10-CM | POA: Diagnosis not present

## 2024-07-09 DIAGNOSIS — F01C18 Vascular dementia, severe, with other behavioral disturbance: Secondary | ICD-10-CM | POA: Diagnosis not present

## 2024-07-09 DIAGNOSIS — K523 Indeterminate colitis: Secondary | ICD-10-CM | POA: Diagnosis not present

## 2024-07-09 DIAGNOSIS — I2510 Atherosclerotic heart disease of native coronary artery without angina pectoris: Secondary | ICD-10-CM | POA: Diagnosis not present

## 2024-07-09 DIAGNOSIS — I10 Essential (primary) hypertension: Secondary | ICD-10-CM | POA: Diagnosis not present

## 2024-07-11 DIAGNOSIS — F01C18 Vascular dementia, severe, with other behavioral disturbance: Secondary | ICD-10-CM | POA: Diagnosis not present

## 2024-07-11 DIAGNOSIS — N401 Enlarged prostate with lower urinary tract symptoms: Secondary | ICD-10-CM | POA: Diagnosis not present

## 2024-07-11 DIAGNOSIS — I10 Essential (primary) hypertension: Secondary | ICD-10-CM | POA: Diagnosis not present

## 2024-07-11 DIAGNOSIS — K523 Indeterminate colitis: Secondary | ICD-10-CM | POA: Diagnosis not present

## 2024-07-11 DIAGNOSIS — I2510 Atherosclerotic heart disease of native coronary artery without angina pectoris: Secondary | ICD-10-CM | POA: Diagnosis not present

## 2024-07-11 DIAGNOSIS — E785 Hyperlipidemia, unspecified: Secondary | ICD-10-CM | POA: Diagnosis not present

## 2024-07-12 DIAGNOSIS — K523 Indeterminate colitis: Secondary | ICD-10-CM | POA: Diagnosis not present

## 2024-07-12 DIAGNOSIS — F01C18 Vascular dementia, severe, with other behavioral disturbance: Secondary | ICD-10-CM | POA: Diagnosis not present

## 2024-07-12 DIAGNOSIS — I10 Essential (primary) hypertension: Secondary | ICD-10-CM | POA: Diagnosis not present

## 2024-07-12 DIAGNOSIS — N401 Enlarged prostate with lower urinary tract symptoms: Secondary | ICD-10-CM | POA: Diagnosis not present

## 2024-07-12 DIAGNOSIS — I2510 Atherosclerotic heart disease of native coronary artery without angina pectoris: Secondary | ICD-10-CM | POA: Diagnosis not present

## 2024-07-12 DIAGNOSIS — E785 Hyperlipidemia, unspecified: Secondary | ICD-10-CM | POA: Diagnosis not present

## 2024-07-13 ENCOUNTER — Emergency Department (HOSPITAL_COMMUNITY)

## 2024-07-13 ENCOUNTER — Emergency Department (HOSPITAL_COMMUNITY)
Admission: EM | Admit: 2024-07-13 | Discharge: 2024-07-13 | Disposition: A | Discharge status: Skilled Nursing Facility | Attending: Emergency Medicine | Admitting: Emergency Medicine

## 2024-07-13 ENCOUNTER — Other Ambulatory Visit: Payer: Self-pay

## 2024-07-13 DIAGNOSIS — W19XXXA Unspecified fall, initial encounter: Secondary | ICD-10-CM | POA: Diagnosis not present

## 2024-07-13 DIAGNOSIS — E785 Hyperlipidemia, unspecified: Secondary | ICD-10-CM | POA: Diagnosis not present

## 2024-07-13 DIAGNOSIS — S0101XA Laceration without foreign body of scalp, initial encounter: Secondary | ICD-10-CM | POA: Diagnosis not present

## 2024-07-13 DIAGNOSIS — S0990XA Unspecified injury of head, initial encounter: Secondary | ICD-10-CM | POA: Diagnosis present

## 2024-07-13 DIAGNOSIS — Z79899 Other long term (current) drug therapy: Secondary | ICD-10-CM | POA: Insufficient documentation

## 2024-07-13 DIAGNOSIS — I2510 Atherosclerotic heart disease of native coronary artery without angina pectoris: Secondary | ICD-10-CM | POA: Diagnosis not present

## 2024-07-13 DIAGNOSIS — N401 Enlarged prostate with lower urinary tract symptoms: Secondary | ICD-10-CM | POA: Diagnosis not present

## 2024-07-13 DIAGNOSIS — F0390 Unspecified dementia without behavioral disturbance: Secondary | ICD-10-CM | POA: Insufficient documentation

## 2024-07-13 DIAGNOSIS — R030 Elevated blood-pressure reading, without diagnosis of hypertension: Secondary | ICD-10-CM

## 2024-07-13 DIAGNOSIS — F01C18 Vascular dementia, severe, with other behavioral disturbance: Secondary | ICD-10-CM | POA: Diagnosis not present

## 2024-07-13 DIAGNOSIS — K523 Indeterminate colitis: Secondary | ICD-10-CM | POA: Diagnosis not present

## 2024-07-13 DIAGNOSIS — I10 Essential (primary) hypertension: Secondary | ICD-10-CM | POA: Diagnosis not present

## 2024-07-13 MED ORDER — AMLODIPINE BESYLATE 5 MG PO TABS
5.0000 mg | ORAL_TABLET | Freq: Once | ORAL | Status: AC
  Administered 2024-07-13: 5 mg via ORAL
  Filled 2024-07-13: qty 1

## 2024-07-13 MED ORDER — LIDOCAINE-EPINEPHRINE-TETRACAINE (LET) TOPICAL GEL
3.0000 mL | Freq: Once | TOPICAL | Status: AC
  Administered 2024-07-13: 3 mL via TOPICAL
  Filled 2024-07-13: qty 3

## 2024-07-13 NOTE — ED Triage Notes (Signed)
 Pt BIBA from [facility] with c/o of witnessed fall. Pt slid off the side of the bed and hit the R side of head on dresser. 0.5 in lac on top of head. Pt complains of headache. Pt has right sided facial droop at baseline. BP 196/96

## 2024-07-13 NOTE — ED Provider Notes (Signed)
  EMERGENCY DEPARTMENT AT [facility] Provider Note   CSN: [id number] Arrival date & time: 07/13/24  0145     Patient presents with: Julian [name] is a 89 y.o. male.   The history is provided by the EMS personnel, the spouse and a friend. The history is limited by the condition of the patient (level 5 dementia).  Fall This is a new problem. The current episode started less than 1 hour ago. The problem occurs constantly. The problem has been resolved. Nothing aggravates the symptoms. Nothing relieves the symptoms. He has tried nothing for the symptoms. The treatment provided no relief.       Prior to Admission medications   Medication Sig Start Date End Date Taking? Authorizing Provider  acetaminophen  (TYLENOL ) 500 MG tablet Take 1 tablet (500 mg total) by mouth every 6 (six) hours as needed for mild pain (pain score 1-3), fever or headache. 06/26/24   Cheryle Page, MD  amLODipine  (NORVASC ) 5 MG tablet Take 1 tablet (5 mg total) by mouth daily. 05/22/24   [place], Camellia PARAS, DO  bisacodyl  (DULCOLAX) 10 MG suppository Place 1 suppository (10 mg total) rectally daily as needed for moderate constipation. 05/21/24   [place], Camellia PARAS, DO  citalopram  (CELEXA ) 20 MG tablet Take 20 mg by mouth at bedtime.    [provider]  divalproex  (DEPAKOTE  SPRINKLE) 125 MG capsule Take 2 capsules (250 mg total) by mouth every 12 (twelve) hours. 05/21/24 08/19/24  [place], Eric J, DO  fluticasone (FLONASE) 50 MCG/ACT nasal spray Place 2 sprays into both nostrils at bedtime.    [provider]  haloperidol  (HALDOL ) 1 MG tablet Take 2 tablets (2 mg total) by mouth every 6 (six) hours as needed for agitation. 06/26/24   Cheryle Page, MD  haloperidol  (HALDOL ) 1 MG tablet Take 2 tablets (2 mg total) by mouth every 6 (six) hours as needed for agitation. 06/26/24   Cheryle Page, MD  levothyroxine  (SYNTHROID ) 75 MCG tablet Take 75 mcg by mouth daily before breakfast.     [provider]  LORazepam  (ATIVAN ) 0.5 MG tablet Take 1 tablet (0.5 mg total) by mouth every 8 (eight) hours as needed (agitation). 06/26/24   Cheryle Page, MD  LORazepam  (ATIVAN ) 0.5 MG tablet Take 1 tablet (0.5 mg total) by mouth every 8 (eight) hours as needed (agitation). 06/26/24   Cheryle Page, MD  losartan  (COZAAR ) 100 MG tablet TAKE 1 TABLET BY MOUTH DAILY Patient taking differently: Take 100 mg by mouth daily. 12/16/22   Nahser, Aleene PARAS, MD  OLANZapine  (ZYPREXA ) 10 MG tablet Take 1 tablet (10 mg total) by mouth at bedtime. (Total of 15 mg olanzapine  daily at bedtime) 06/26/24   Cheryle Page, MD  OLANZapine  (ZYPREXA ) 10 MG tablet Take 1 tablet (10 mg total) by mouth at bedtime. (Total of 15 mg olanzapine  daily at bedtime) 06/26/24   Cheryle Page, MD  OLANZapine  (ZYPREXA ) 5 MG tablet Take 1 tablet (5 mg total) by mouth at bedtime. 06/26/24 07/26/24  Cheryle Page, MD  oxyCODONE  (ROXICODONE ) 5 MG immediate release tablet Take 1 tablet (5 mg total) by mouth every 6 (six) hours as needed for severe pain (pain score 7-10). 06/26/24 06/26/25  Cheryle Page, MD  oxyCODONE  (ROXICODONE ) 5 MG immediate release tablet Take 1 tablet (5 mg total) by mouth every 6 (six) hours as needed for severe pain (pain score 7-10). 06/26/24 06/26/25  Cheryle Page, MD  Propylene Glycol (SYSTANE COMPLETE) 0.6 % SOLN Place  1 drop into both eyes daily.    [provider]  tamsulosin  (FLOMAX ) 0.4 MG CAPS capsule Take 1 capsule (0.4 mg total) by mouth 2 (two) times daily. 05/21/24   [place], Camellia PARAS, DO  traZODone  (DESYREL ) 100 MG tablet Take 2 tablets (200 mg total) by mouth at bedtime. 06/26/24   Cheryle Page, MD  traZODone  (DESYREL ) 100 MG tablet Take 2 tablets (200 mg total) by mouth at bedtime. 06/26/24   Cheryle Page, MD    Allergies: Patient has no known allergies.    Review of Systems  Unable to perform ROS: Dementia  Skin:  Positive for wound.    Updated Vital Signs BP (!) 195/82    Pulse 68   Resp 14   SpO2 94%   Physical Exam Vitals and nursing note reviewed.  Constitutional:      General: He is not in acute distress.    Appearance: He is well-developed. He is not diaphoretic.  HENT:     Head: Normocephalic.      Nose: Nose normal.  Eyes:     Conjunctiva/sclera: Conjunctivae normal.     Pupils: Pupils are equal, round, and reactive to light.  Cardiovascular:     Rate and Rhythm: Normal rate and regular rhythm.     Pulses: Normal pulses.     Heart sounds: Normal heart sounds.  Pulmonary:     Effort: Pulmonary effort is normal.     Breath sounds: Normal breath sounds. No wheezing or rales.  Abdominal:     General: Bowel sounds are normal.     Palpations: Abdomen is soft.     Tenderness: There is no abdominal tenderness. There is no guarding or rebound.  Musculoskeletal:        General: Normal range of motion.     Cervical back: Normal range of motion and neck supple.  Skin:    General: Skin is warm and dry.     Capillary Refill: Capillary refill takes less than 2 seconds.  Neurological:     General: No focal deficit present.     Mental Status: He is alert and oriented to person, place, and time.     Deep Tendon Reflexes: Reflexes normal.  Psychiatric:        Mood and Affect: Mood normal.        Behavior: Behavior normal.     (all labs ordered are listed, but only abnormal results are displayed) Labs Reviewed - No data to display  EKG: None  Radiology: No results found.   .Laceration Repair  Date/Time: 07/13/2024 4:03 AM  Performed by: Nettie Earing, MD Authorized by: Nettie Earing, MD   Consent:    Consent obtained:  Verbal   Consent given by:  Spouse   Risks discussed:  Need for additional repair, nerve damage, pain, poor cosmetic result and poor wound healing   Alternatives discussed:  No treatment Universal protocol:    Patient identity confirmed:  Arm band Anesthesia:    Anesthesia method:  Topical application   Topical  anesthetic:  LET Laceration details:    Location:  Scalp   Scalp location:  Frontal   Length (cm):  1.3   Depth (mm):  1 Pre-procedure details:    Preparation:  Patient was prepped and draped in usual sterile fashion Exploration:    Hemostasis achieved with:  Direct pressure   Wound extent: fascia not violated, no foreign body and no signs of injury     Contaminated: no   Treatment:  Area cleansed with:  Povidone-iodine, chlorhexidine  and saline   Amount of cleaning:  Extensive Skin repair:    Repair method:  Staples   Number of staples:  3 Approximation:    Approximation:  Close Repair type:    Repair type:  Simple Post-procedure details:    Dressing:  Sterile dressing    Medications Ordered in the ED  lidocaine -EPINEPHrine -tetracaine  (LET) topical gel (3 mLs Topical Incomplete 07/13/24 0239)                                    Medical Decision Making Patient fell going to the bathroom at facility   Amount and/or Complexity of Data Reviewed Independent Historian: EMS External Data Reviewed: notes.    Details: Previous notes reviewed  Radiology: ordered and independent interpretation performed.    Details: Negative head CT  Risk Prescription drug management. Risk Details: Wound care instructions provided.  FOllow up at urgent care for suture removal in 5 days.       Final diagnoses:  None   No signs of systemic illness or infection. The patient is nontoxic-appearing on exam and vital signs are within normal limits.  I have reviewed the triage vital signs and the nursing notes. Pertinent labs & imaging results that were available during my care of the patient were reviewed by me and considered in my medical decision making (see chart for details). After history, exam, and medical workup I feel the patient has been appropriately medically screened and is safe for discharge home. Pertinent diagnoses were discussed with the patient. Patient was given return  precautions.  ED Discharge Orders     None          Gardiner Espana, MD 07/13/24 9589

## 2024-07-15 DIAGNOSIS — I10 Essential (primary) hypertension: Secondary | ICD-10-CM | POA: Diagnosis not present

## 2024-07-15 DIAGNOSIS — K523 Indeterminate colitis: Secondary | ICD-10-CM | POA: Diagnosis not present

## 2024-07-15 DIAGNOSIS — N401 Enlarged prostate with lower urinary tract symptoms: Secondary | ICD-10-CM | POA: Diagnosis not present

## 2024-07-15 DIAGNOSIS — I2510 Atherosclerotic heart disease of native coronary artery without angina pectoris: Secondary | ICD-10-CM | POA: Diagnosis not present

## 2024-07-15 DIAGNOSIS — E785 Hyperlipidemia, unspecified: Secondary | ICD-10-CM | POA: Diagnosis not present

## 2024-07-15 DIAGNOSIS — F01C18 Vascular dementia, severe, with other behavioral disturbance: Secondary | ICD-10-CM | POA: Diagnosis not present

## 2024-07-16 DIAGNOSIS — E785 Hyperlipidemia, unspecified: Secondary | ICD-10-CM | POA: Diagnosis not present

## 2024-07-16 DIAGNOSIS — K523 Indeterminate colitis: Secondary | ICD-10-CM | POA: Diagnosis not present

## 2024-07-16 DIAGNOSIS — I2510 Atherosclerotic heart disease of native coronary artery without angina pectoris: Secondary | ICD-10-CM | POA: Diagnosis not present

## 2024-07-16 DIAGNOSIS — N401 Enlarged prostate with lower urinary tract symptoms: Secondary | ICD-10-CM | POA: Diagnosis not present

## 2024-07-16 DIAGNOSIS — I10 Essential (primary) hypertension: Secondary | ICD-10-CM | POA: Diagnosis not present

## 2024-07-16 DIAGNOSIS — F01C18 Vascular dementia, severe, with other behavioral disturbance: Secondary | ICD-10-CM | POA: Diagnosis not present

## 2024-07-17 DIAGNOSIS — K523 Indeterminate colitis: Secondary | ICD-10-CM | POA: Diagnosis not present

## 2024-07-17 DIAGNOSIS — F01C18 Vascular dementia, severe, with other behavioral disturbance: Secondary | ICD-10-CM | POA: Diagnosis not present

## 2024-07-17 DIAGNOSIS — I2510 Atherosclerotic heart disease of native coronary artery without angina pectoris: Secondary | ICD-10-CM | POA: Diagnosis not present

## 2024-07-17 DIAGNOSIS — I10 Essential (primary) hypertension: Secondary | ICD-10-CM | POA: Diagnosis not present

## 2024-07-17 DIAGNOSIS — N401 Enlarged prostate with lower urinary tract symptoms: Secondary | ICD-10-CM | POA: Diagnosis not present

## 2024-07-17 DIAGNOSIS — E785 Hyperlipidemia, unspecified: Secondary | ICD-10-CM | POA: Diagnosis not present

## 2024-07-18 DIAGNOSIS — E785 Hyperlipidemia, unspecified: Secondary | ICD-10-CM | POA: Diagnosis not present

## 2024-07-18 DIAGNOSIS — K523 Indeterminate colitis: Secondary | ICD-10-CM | POA: Diagnosis not present

## 2024-07-18 DIAGNOSIS — N401 Enlarged prostate with lower urinary tract symptoms: Secondary | ICD-10-CM | POA: Diagnosis not present

## 2024-07-18 DIAGNOSIS — I10 Essential (primary) hypertension: Secondary | ICD-10-CM | POA: Diagnosis not present

## 2024-07-18 DIAGNOSIS — F01C18 Vascular dementia, severe, with other behavioral disturbance: Secondary | ICD-10-CM | POA: Diagnosis not present

## 2024-07-18 DIAGNOSIS — I2510 Atherosclerotic heart disease of native coronary artery without angina pectoris: Secondary | ICD-10-CM | POA: Diagnosis not present

## 2024-09-15 ENCOUNTER — Other Ambulatory Visit: Payer: Self-pay

## 2024-09-15 ENCOUNTER — Encounter (HOSPITAL_COMMUNITY): Payer: Self-pay

## 2024-09-15 ENCOUNTER — Emergency Department (HOSPITAL_COMMUNITY)

## 2024-09-15 ENCOUNTER — Inpatient Hospital Stay (HOSPITAL_COMMUNITY)
Admission: EM | Admit: 2024-09-15 | Discharge: 2024-09-19 | DRG: 871 | Disposition: A | Discharge status: Hospice | Source: Skilled Nursing Facility | Attending: Student | Admitting: Student

## 2024-09-15 DIAGNOSIS — E785 Hyperlipidemia, unspecified: Secondary | ICD-10-CM | POA: Diagnosis present

## 2024-09-15 DIAGNOSIS — R1310 Dysphagia, unspecified: Secondary | ICD-10-CM | POA: Diagnosis present

## 2024-09-15 DIAGNOSIS — Z79899 Other long term (current) drug therapy: Secondary | ICD-10-CM

## 2024-09-15 DIAGNOSIS — F05 Delirium due to known physiological condition: Secondary | ICD-10-CM | POA: Diagnosis present

## 2024-09-15 DIAGNOSIS — E872 Acidosis, unspecified: Secondary | ICD-10-CM | POA: Diagnosis present

## 2024-09-15 DIAGNOSIS — I2510 Atherosclerotic heart disease of native coronary artery without angina pectoris: Secondary | ICD-10-CM | POA: Diagnosis present

## 2024-09-15 DIAGNOSIS — Z820 Family history of epilepsy and other diseases of the nervous system: Secondary | ICD-10-CM

## 2024-09-15 DIAGNOSIS — Z7189 Other specified counseling: Secondary | ICD-10-CM

## 2024-09-15 DIAGNOSIS — I10 Essential (primary) hypertension: Secondary | ICD-10-CM | POA: Diagnosis present

## 2024-09-15 DIAGNOSIS — R410 Disorientation, unspecified: Secondary | ICD-10-CM

## 2024-09-15 DIAGNOSIS — L89153 Pressure ulcer of sacral region, stage 3: Secondary | ICD-10-CM | POA: Diagnosis present

## 2024-09-15 DIAGNOSIS — R6520 Severe sepsis without septic shock: Secondary | ICD-10-CM | POA: Diagnosis present

## 2024-09-15 DIAGNOSIS — N390 Urinary tract infection, site not specified: Secondary | ICD-10-CM | POA: Diagnosis present

## 2024-09-15 DIAGNOSIS — G4710 Hypersomnia, unspecified: Secondary | ICD-10-CM | POA: Diagnosis present

## 2024-09-15 DIAGNOSIS — E876 Hypokalemia: Secondary | ICD-10-CM | POA: Diagnosis present

## 2024-09-15 DIAGNOSIS — Z515 Encounter for palliative care: Secondary | ICD-10-CM

## 2024-09-15 DIAGNOSIS — Z1152 Encounter for screening for COVID-19: Secondary | ICD-10-CM

## 2024-09-15 DIAGNOSIS — Z87891 Personal history of nicotine dependence: Secondary | ICD-10-CM | POA: Diagnosis not present

## 2024-09-15 DIAGNOSIS — Z8546 Personal history of malignant neoplasm of prostate: Secondary | ICD-10-CM

## 2024-09-15 DIAGNOSIS — A419 Sepsis, unspecified organism: Principal | ICD-10-CM | POA: Diagnosis present

## 2024-09-15 DIAGNOSIS — N179 Acute kidney failure, unspecified: Secondary | ICD-10-CM | POA: Diagnosis present

## 2024-09-15 DIAGNOSIS — L8990 Pressure ulcer of unspecified site, unspecified stage: Secondary | ICD-10-CM | POA: Insufficient documentation

## 2024-09-15 DIAGNOSIS — Z8673 Personal history of transient ischemic attack (TIA), and cerebral infarction without residual deficits: Secondary | ICD-10-CM

## 2024-09-15 DIAGNOSIS — Z66 Do not resuscitate: Secondary | ICD-10-CM | POA: Diagnosis present

## 2024-09-15 DIAGNOSIS — G9341 Metabolic encephalopathy: Secondary | ICD-10-CM | POA: Diagnosis present

## 2024-09-15 DIAGNOSIS — Z8249 Family history of ischemic heart disease and other diseases of the circulatory system: Secondary | ICD-10-CM | POA: Diagnosis not present

## 2024-09-15 DIAGNOSIS — G309 Alzheimer's disease, unspecified: Secondary | ICD-10-CM | POA: Diagnosis present

## 2024-09-15 DIAGNOSIS — F02818 Dementia in other diseases classified elsewhere, unspecified severity, with other behavioral disturbance: Secondary | ICD-10-CM | POA: Diagnosis present

## 2024-09-15 DIAGNOSIS — Z9079 Acquired absence of other genital organ(s): Secondary | ICD-10-CM

## 2024-09-15 DIAGNOSIS — R197 Diarrhea, unspecified: Secondary | ICD-10-CM | POA: Diagnosis present

## 2024-09-15 DIAGNOSIS — F0280 Dementia in other diseases classified elsewhere without behavioral disturbance: Secondary | ICD-10-CM | POA: Diagnosis present

## 2024-09-15 DIAGNOSIS — Z79890 Hormone replacement therapy (postmenopausal): Secondary | ICD-10-CM | POA: Diagnosis not present

## 2024-09-15 LAB — URINALYSIS, W/ REFLEX TO CULTURE (INFECTION SUSPECTED)
Glucose, UA: NEGATIVE mg/dL
Hgb urine dipstick: NEGATIVE
Ketones, ur: NEGATIVE mg/dL
Nitrite: NEGATIVE
Protein, ur: 30 mg/dL — AB
Specific Gravity, Urine: 1.02 (ref 1.005–1.030)
pH: 5 (ref 5.0–8.0)

## 2024-09-15 LAB — CBC WITH DIFFERENTIAL/PLATELET
Abs Immature Granulocytes: 0.07 10*3/uL (ref 0.00–0.07)
Basophils Absolute: 0.1 10*3/uL (ref 0.0–0.1)
Basophils Relative: 0 %
Eosinophils Absolute: 0 10*3/uL (ref 0.0–0.5)
Eosinophils Relative: 0 %
HCT: 41.7 % (ref 39.0–52.0)
Hemoglobin: 12.9 g/dL — ABNORMAL LOW (ref 13.0–17.0)
Immature Granulocytes: 0 %
Lymphocytes Relative: 2 %
Lymphs Abs: 0.3 10*3/uL — ABNORMAL LOW (ref 0.7–4.0)
MCH: 28 pg (ref 26.0–34.0)
MCHC: 30.9 g/dL (ref 30.0–36.0)
MCV: 90.5 fL (ref 80.0–100.0)
Monocytes Absolute: 1.4 10*3/uL — ABNORMAL HIGH (ref 0.1–1.0)
Monocytes Relative: 8 %
Neutro Abs: 15.5 10*3/uL — ABNORMAL HIGH (ref 1.7–7.7)
Neutrophils Relative %: 90 %
Platelets: 256 10*3/uL (ref 150–400)
RBC: 4.61 MIL/uL (ref 4.22–5.81)
RDW: 15.4 % (ref 11.5–15.5)
WBC: 17.4 10*3/uL — ABNORMAL HIGH (ref 4.0–10.5)
nRBC: 0 % (ref 0.0–0.2)

## 2024-09-15 LAB — COMPREHENSIVE METABOLIC PANEL WITH GFR
ALT: 17 U/L (ref 0–44)
AST: 20 U/L (ref 15–41)
Albumin: 3.8 g/dL (ref 3.5–5.0)
Alkaline Phosphatase: 99 U/L (ref 38–126)
Anion gap: 13 (ref 5–15)
BUN: 34 mg/dL — ABNORMAL HIGH (ref 8–23)
CO2: 25 mmol/L (ref 22–32)
Calcium: 9.4 mg/dL (ref 8.9–10.3)
Chloride: 107 mmol/L (ref 98–111)
Creatinine, Ser: 1.25 mg/dL — ABNORMAL HIGH (ref 0.61–1.24)
GFR, Estimated: 53 mL/min — ABNORMAL LOW
Glucose, Bld: 115 mg/dL — ABNORMAL HIGH (ref 70–99)
Potassium: 4.2 mmol/L (ref 3.5–5.1)
Sodium: 145 mmol/L (ref 135–145)
Total Bilirubin: 0.7 mg/dL (ref 0.0–1.2)
Total Protein: 7.4 g/dL (ref 6.5–8.1)

## 2024-09-15 LAB — I-STAT CG4 LACTIC ACID, ED
Lactic Acid, Venous: 3.2 mmol/L (ref 0.5–1.9)
Lactic Acid, Venous: 2.9 mmol/L (ref 0.5–1.9)

## 2024-09-15 LAB — BLOOD GAS, VENOUS
Acid-Base Excess: 2 mmol/L (ref 0.0–2.0)
Bicarbonate: 27.3 mmol/L (ref 20.0–28.0)
O2 Saturation: 82.7 %
Patient temperature: 37
pCO2, Ven: 44 mmHg (ref 44–60)
pH, Ven: 7.4 (ref 7.25–7.43)
pO2, Ven: 47 mmHg — ABNORMAL HIGH (ref 32–45)

## 2024-09-15 LAB — RESP PANEL BY RT-PCR (RSV, FLU A&B, COVID)  RVPGX2
Influenza A by PCR: NEGATIVE
Influenza B by PCR: NEGATIVE
Resp Syncytial Virus by PCR: NEGATIVE
SARS Coronavirus 2 by RT PCR: NEGATIVE

## 2024-09-15 LAB — PROTIME-INR
INR: 1 (ref 0.8–1.2)
Prothrombin Time: 14.1 s (ref 11.4–15.2)

## 2024-09-15 MED ORDER — LACTATED RINGERS IV SOLN: INTRAVENOUS | Status: DC

## 2024-09-15 MED ORDER — POLYETHYLENE GLYCOL 3350 17 G PO PACK: 17.0000 g | PACK | Freq: Every day | ORAL | Status: DC | PRN

## 2024-09-15 MED ORDER — ACETAMINOPHEN 650 MG RE SUPP
650.0000 mg | Freq: Once | RECTAL | Status: DC
  Filled 2024-09-15: qty 1

## 2024-09-15 MED ORDER — SODIUM CHLORIDE 0.9 % IV SOLN
2.0000 g | INTRAVENOUS | Status: DC
  Administered 2024-09-16 – 2024-09-18 (×3): 2 g via INTRAVENOUS
  Filled 2024-09-15 (×3): qty 20

## 2024-09-15 MED ORDER — LACTATED RINGERS IV BOLUS (SEPSIS)
250.0000 mL | Freq: Once | INTRAVENOUS | Status: AC
  Administered 2024-09-16: 250 mL via INTRAVENOUS

## 2024-09-15 MED ORDER — LACTATED RINGERS IV BOLUS (SEPSIS)
1000.0000 mL | Freq: Once | INTRAVENOUS | Status: AC
  Administered 2024-09-15: 1000 mL via INTRAVENOUS

## 2024-09-15 MED ORDER — ENOXAPARIN SODIUM 40 MG/0.4ML IJ SOSY
40.0000 mg | PREFILLED_SYRINGE | INTRAMUSCULAR | Status: DC
  Administered 2024-09-16 – 2024-09-18 (×3): 40 mg via SUBCUTANEOUS
  Filled 2024-09-15 (×3): qty 0.4

## 2024-09-15 MED ORDER — SODIUM CHLORIDE 0.9 % IV SOLN
1.0000 g | Freq: Once | INTRAVENOUS | Status: AC
  Administered 2024-09-15: 1 g via INTRAVENOUS
  Filled 2024-09-15: qty 10

## 2024-09-15 MED ORDER — LORAZEPAM 2 MG/ML IJ SOLN: 1.0000 mg | Freq: Once | INTRAMUSCULAR | Status: DC

## 2024-09-15 MED ORDER — PROCHLORPERAZINE EDISYLATE 10 MG/2ML IJ SOLN: 5.0000 mg | Freq: Four times a day (QID) | INTRAMUSCULAR | Status: DC | PRN

## 2024-09-15 MED ORDER — ACETAMINOPHEN 10 MG/ML IV SOLN
1000.0000 mg | Freq: Once | INTRAVENOUS | Status: AC
  Administered 2024-09-15: 1000 mg via INTRAVENOUS
  Filled 2024-09-15: qty 100

## 2024-09-15 MED ORDER — ACETAMINOPHEN 325 MG PO TABS: 650.0000 mg | ORAL_TABLET | Freq: Four times a day (QID) | ORAL | Status: DC | PRN

## 2024-09-15 MED ORDER — MELATONIN 5 MG PO TABS: 5.0000 mg | ORAL_TABLET | Freq: Every evening | ORAL | Status: DC | PRN

## 2024-09-15 NOTE — ED Notes (Signed)
 PA aware of cardiac alarms

## 2024-09-15 NOTE — ED Notes (Addendum)
 Pt continued to have massive loose stool movements.  AS we were cleaning him up, he continued to have movements. A flex seal was placed per admitting provider (verbal).  Pt also had some urine output only about which was very dark.  Another sample was sent to the lab.  Pt has very limited response to RN but is able to hold on strongly to the railing of the bed as he was being cleaned.  Report was called to accepting RN.

## 2024-09-15 NOTE — H&P (Addendum)
 " History and Physical  Julian [name] FMW:[id number] DOB: Nov 27, 1925 DOA: 09/15/2024  Referring physician: Tinnie Matter, PA-EDP  PCP: Julian [name]., MD  Outpatient Specialists: Cardiology, hospice care. Patient coming from: Memory care facility.  Chief Complaint: Altered mental status  HPI: Julian [name] is a 89 y.o. male with medical history significant for dementia, hypertension, hyperlipidemia, TIA, coronary artery disease, who presents to the ER via EMS from memory care facility due to altered mental status.  Associated with new onset of hematuria.  Per his wife, this morning he was talking and more alert.   This afternoon, when she visited him around 4 PM, she noticed that he was visibly shaking and not verbal.  She called EMS.  The patient was started on Macrobid on Thursday, 2 days ago, by Julian [name], his PCP, for UTI.  At baseline, the patient is alert to self, verbal, and ambulatory although with difficulty.  EMS was activated.  He was brought to the ER for treatment of presumed UTI.  In the ER, febrile with Tmax 100.6, leukocytosis 17.4 K.  Code sepsis was called in the ER.  Peripheral blood cultures obtained, UA and urine culture are pending.  The patient received 1 dose of Rocephin  1 g x 1, LR bolus 1 L x 2.  TRH, hospitalist service, was asked to admit.  At the time of this visit, the patient is hypersomnolent.  Has had large loose stools in the ER, order placed for Flexi-Seal to be inserted, to protect the patient's skin.  ED Course: Temperature 100.6.  BP 122/81, pulse 89, respiratory rate 19, O2 saturation 94% on room air.  Review of Systems: Review of systems as noted in the HPI. All other systems reviewed and are negative.   Past Medical History:  Diagnosis Date   Alzheimer's dementia (HCC)    Anemia    Dementia (HCC)    Hyperlipidemia    Hypertension    Prostate cancer (HCC)    Past Surgical History:  Procedure Laterality Date   CARPAL TUNNEL RELEASE  Left    HERNIA REPAIR     MID 80'S   KNEE SURGERY Left    PROSTATECTOMY  2001    Social History:  reports that he quit smoking about 42 years ago. His smoking use included cigarettes. He has never used smokeless tobacco. He reports current alcohol  use. He reports that he does not use drugs.   Allergies[1]  Family History  Problem Relation Age of Onset   CAD Mother    CAD Father    Dementia Sister    Heart attack Sister    Alzheimer's disease Brother       Prior to Admission medications  Medication Sig Start Date End Date Taking? Authorizing Provider  acetaminophen  (TYLENOL ) 500 MG tablet Take 1 tablet (500 mg total) by mouth every 6 (six) hours as needed for mild pain (pain score 1-3), fever or headache. 06/26/24   Julian Page, MD  amLODipine  (NORVASC ) 5 MG tablet Take 1 tablet (5 mg total) by mouth daily. 05/22/24   [place], Camellia PARAS, DO  bisacodyl  (DULCOLAX) 10 MG suppository Place 1 suppository (10 mg total) rectally daily as needed for moderate constipation. 05/21/24   [place], Camellia PARAS, DO  citalopram  (CELEXA ) 20 MG tablet Take 20 mg by mouth at bedtime.    [provider]  divalproex  (DEPAKOTE  SPRINKLE) 125 MG capsule Take 2 capsules (250 mg total) by mouth every 12 (twelve) hours. 05/21/24 08/19/24  [place], Eric  J, DO  fluticasone (FLONASE) 50 MCG/ACT nasal spray Place 2 sprays into both nostrils at bedtime.    [provider]  haloperidol  (HALDOL ) 1 MG tablet Take 2 tablets (2 mg total) by mouth every 6 (six) hours as needed for agitation. 06/26/24   Julian Page, MD  haloperidol  (HALDOL ) 1 MG tablet Take 2 tablets (2 mg total) by mouth every 6 (six) hours as needed for agitation. 06/26/24   Julian Page, MD  levothyroxine  (SYNTHROID ) 75 MCG tablet Take 75 mcg by mouth daily before breakfast.    [provider]  LORazepam  (ATIVAN ) 0.5 MG tablet Take 1 tablet (0.5 mg total) by mouth every 8 (eight) hours as needed (agitation). 06/26/24   Julian Page,  MD  LORazepam  (ATIVAN ) 0.5 MG tablet Take 1 tablet (0.5 mg total) by mouth every 8 (eight) hours as needed (agitation). 06/26/24   Julian Page, MD  losartan  (COZAAR ) 100 MG tablet TAKE 1 TABLET BY MOUTH DAILY Patient taking differently: Take 100 mg by mouth daily. 12/16/22   Nahser, Aleene PARAS, MD  OLANZapine  (ZYPREXA ) 10 MG tablet Take 1 tablet (10 mg total) by mouth at bedtime. (Total of 15 mg olanzapine  daily at bedtime) 06/26/24   Alekh, Kshitiz, MD  OLANZapine  (ZYPREXA ) 10 MG tablet Take 1 tablet (10 mg total) by mouth at bedtime. (Total of 15 mg olanzapine  daily at bedtime) 06/26/24   Julian Page, MD  OLANZapine  (ZYPREXA ) 5 MG tablet Take 1 tablet (5 mg total) by mouth at bedtime. 06/26/24 07/26/24  Julian Page, MD  oxyCODONE  (ROXICODONE ) 5 MG immediate release tablet Take 1 tablet (5 mg total) by mouth every 6 (six) hours as needed for severe pain (pain score 7-10). 06/26/24 06/26/25  Julian Page, MD  oxyCODONE  (ROXICODONE ) 5 MG immediate release tablet Take 1 tablet (5 mg total) by mouth every 6 (six) hours as needed for severe pain (pain score 7-10). 06/26/24 06/26/25  Julian Page, MD  Propylene Glycol (SYSTANE COMPLETE) 0.6 % SOLN Place 1 drop into both eyes daily.    [provider]  tamsulosin  (FLOMAX ) 0.4 MG CAPS capsule Take 1 capsule (0.4 mg total) by mouth 2 (two) times daily. 05/21/24   [place], Camellia PARAS, DO  traZODone  (DESYREL ) 100 MG tablet Take 2 tablets (200 mg total) by mouth at bedtime. 06/26/24   Julian Page, MD  traZODone  (DESYREL ) 100 MG tablet Take 2 tablets (200 mg total) by mouth at bedtime. 06/26/24   Julian Page, MD    Physical Exam: BP 129/85   Pulse 79   Temp (!) 100.6 F (38.1 C) (Rectal)   Resp 14   Ht 5' 10 (1.778 m)   Wt 66.8 kg   SpO2 96%   BMI 21.13 kg/m   General: 89 y.o. year-old male well developed well nourished in no acute distress.  Somnolent. Cardiovascular: Regular rate and rhythm with no rubs or gallops.  No thyromegaly or JVD  noted.  No lower extremity edema bilaterally. Respiratory: Clear to auscultation with no wheezes or rales. Good inspiratory effort. Abdomen: Soft nontender nondistended with normal bowel sounds x4 quadrants. Muskuloskeletal: No cyanosis, clubbing or edema noted bilaterally Neuro: CN II-XII intact, strength, sensation, reflexes Skin: No ulcerative lesions noted or rashes Psychiatry: Unable to assess recent low mood due to somnolence.         Labs on Admission:  Basic Metabolic Panel: Recent Labs  Lab 09/15/24 1845  NA 145  K 4.2  CL 107  CO2 25  GLUCOSE 115*  BUN  34*  CREATININE 1.25*  CALCIUM  9.4   Liver Function Tests: Recent Labs  Lab 09/15/24 1845  AST 20  ALT 17  ALKPHOS 99  BILITOT 0.7  PROT 7.4  ALBUMIN 3.8   No results for input(s): LIPASE, AMYLASE in the last 168 hours. No results for input(s): AMMONIA in the last 168 hours. CBC: Recent Labs  Lab 09/15/24 1845  WBC 17.4*  NEUTROABS 15.5*  HGB 12.9*  HCT 41.7  MCV 90.5  PLT 256   Cardiac Enzymes: No results for input(s): CKTOTAL, CKMB, CKMBINDEX, TROPONINI in the last 168 hours.  BNP (last 3 results) No results for input(s): BNP in the last 8760 hours.  ProBNP (last 3 results) Recent Labs    04/26/24 0407  PROBNP 520.0*    CBG: No results for input(s): GLUCAP in the last 168 hours.  Radiological Exams on Admission: CT Head Wo Contrast Result Date: 09/15/2024 EXAM: CT HEAD WITHOUT CONTRAST 09/15/2024 08:12:12 PM TECHNIQUE: CT of the head was performed without the administration of intravenous contrast. Automated exposure control, iterative reconstruction, and/or weight based adjustment of the mA/kV was utilized to reduce the radiation dose to as low as reasonably achievable. COMPARISON: 07/13/2024 CLINICAL HISTORY: Altered mental status. FINDINGS: BRAIN AND VENTRICLES: Generalized atrophic changes and chronic white [name] ischemic changes are seen. No acute hemorrhage or acute  infarct. No extra-axial collection. No mass effect or midline shift. ORBITS: No acute abnormality. SINUSES: No acute abnormality. SOFT TISSUES AND SKULL: No acute soft tissue abnormality. No skull fracture. IMPRESSION: 1. Chronic atrophic and ischemic changes without acute abnormality. Electronically signed by: Oneil Devonshire MD 09/15/2024 08:17 PM EST RP Workstation: [id number]   DG Chest Port 1 View Result Date: 09/15/2024 EXAM: 1 VIEW(S) XRAY OF THE CHEST 09/15/2024 07:18:00 PM COMPARISON: 04/25/2024 CLINICAL HISTORY: Questionable sepsis. Evaluate for abnormality. FINDINGS: LUNGS AND PLEURA: No focal pulmonary opacity. No pleural effusion. No pneumothorax. HEART AND MEDIASTINUM: Aortic atherosclerosis. No acute abnormality of the cardiac and mediastinal silhouettes. BONES AND SOFT TISSUES: No acute osseous abnormality. IMPRESSION: 1. No acute cardiopulmonary abnormality. Electronically signed by: Dorethia Molt MD 09/15/2024 07:26 PM EST RP Workstation: [id number]    EKG: I independently viewed the EKG done and my findings are as followed: None available at time of this visit.  Assessment/Plan Present on Admission:  Sepsis [facility])  Principal Problem:   Sepsis (HCC)  Severe sepsis secondary to presumed UTI, POA Started on Macrobid 09/13/24 for UTI outpatient. Presented with fever 100.6, leukocytosis 17.4, tachypnea 24. Follow peripheral blood cultures x 2 Follow urine culture for ID and sensitivities Maintain MAP greater than 65 Continue IV fluid hydration per sepsis protocol Continue Rocephin , 2 g daily. Monitor fever curve and WBCs.  Lactic acidosis secondary to severe sepsis Lactic acid 3.2 Repeated lactic acid 2.9.  Continue IV fluid hydration and IV antibiotics. Continue to trend lactic acid.  Acute metabolic encephalopathy secondary to the above Continue to treat underlying condition Reorient as needed. Fall and aspiration precautions.  AKI, suspect prerenal in the setting of  dehydration from poor oral intake At baseline creatinine 0.8 with GFR greater than 60 Presented with creatinine 1.25 with GFR 53 Continue IV fluid hydration Currently low urine output Monitor urine output Repeat BMP in the morning.  Dementia Lives in memory facility, on hospice care Reorient as needed Fall and aspiration precautions.  Goals of care Hospice care patient Palliative care medicine consulted to assist with end-of-life care.    Critical care time: 55 minutes.    DVT  prophylaxis: Subcu Lovenox  daily.  Code Status: DNR/DNI.  Family Communication: The patient's wife at bedside.  Disposition Plan: Admitted to progressive care unit.  Consults called: Palliative care medicine.  Admission status: Inpatient status.   Status is: Inpatient The patient requires at least 2 midnights for further evaluation and treatment of present condition.   Julian LOISE Hurst MD [facility] Pager [phone]  If 7PM-7AM, please contact night-coverage [email or web address] Password [email or web address]  09/15/2024, 10:17 PM      [1] No Known Allergies  "

## 2024-09-15 NOTE — ED Triage Notes (Signed)
 Pt coming [facility] called out by family for change in mental status. Recent Dx of UTI. Per EMS, family reports baseline is confusion but w/ conversation. Not talking today. New onset of blood in urine. Hospice pt.   Hospice has been contacted by facility  T-99.5 HR 88 Cap. 32 140/70 BP  CBG 137  96% RA  EMS gave 500 LR

## 2024-09-15 NOTE — ED Provider Notes (Signed)
 " [place] EMERGENCY DEPARTMENT AT [facility] Provider Note   CSN: [id number] Arrival date & time: 09/15/24  1814     Patient presents with: Altered Mental Status   Julian [name] is a 89 y.o. male with a past medical history of HTN, CAD, TIA, HLD, RBBB, dementia, HTN, hypothyroidism presents to the emergency department via EMS from [facility] memory care unit for evaluation of AMS, hematuria.  At baseline, patient is alert to himself and able to hold a conversation although he is confused.  Today, he has not been talking which is abnormal per family.  Family also noticed new onset of hematuria.  Was recently treated for UTI with macrobid starting yesterday.  Patient is currently hospice patient in setting of dementia but does want acute treatment of UTI  Patient is noncontributory to HPI.  Level 5 caveat  {Add pertinent medical, surgical, social history, OB history to YEP:67052}  Altered Mental Status Presenting symptoms: confusion        Prior to Admission medications  Medication Sig Start Date End Date Taking? Authorizing Provider  acetaminophen  (TYLENOL ) 500 MG tablet Take 1 tablet (500 mg total) by mouth every 6 (six) hours as needed for mild pain (pain score 1-3), fever or headache. 06/26/24   Cheryle Page, MD  amLODipine  (NORVASC ) 5 MG tablet Take 1 tablet (5 mg total) by mouth daily. 05/22/24   [place], Camellia PARAS, DO  bisacodyl  (DULCOLAX) 10 MG suppository Place 1 suppository (10 mg total) rectally daily as needed for moderate constipation. 05/21/24   [place], Camellia PARAS, DO  citalopram  (CELEXA ) 20 MG tablet Take 20 mg by mouth at bedtime.    [provider]  divalproex  (DEPAKOTE  SPRINKLE) 125 MG capsule Take 2 capsules (250 mg total) by mouth every 12 (twelve) hours. 05/21/24 08/19/24  [place], Eric J, DO  fluticasone (FLONASE) 50 MCG/ACT nasal spray Place 2 sprays into both nostrils at bedtime.    [provider]  haloperidol  (HALDOL ) 1 MG tablet  Take 2 tablets (2 mg total) by mouth every 6 (six) hours as needed for agitation. 06/26/24   Cheryle Page, MD  haloperidol  (HALDOL ) 1 MG tablet Take 2 tablets (2 mg total) by mouth every 6 (six) hours as needed for agitation. 06/26/24   Cheryle Page, MD  levothyroxine  (SYNTHROID ) 75 MCG tablet Take 75 mcg by mouth daily before breakfast.    [provider]  LORazepam  (ATIVAN ) 0.5 MG tablet Take 1 tablet (0.5 mg total) by mouth every 8 (eight) hours as needed (agitation). 06/26/24   Cheryle Page, MD  LORazepam  (ATIVAN ) 0.5 MG tablet Take 1 tablet (0.5 mg total) by mouth every 8 (eight) hours as needed (agitation). 06/26/24   Cheryle Page, MD  losartan  (COZAAR ) 100 MG tablet TAKE 1 TABLET BY MOUTH DAILY Patient taking differently: Take 100 mg by mouth daily. 12/16/22   Nahser, Aleene PARAS, MD  OLANZapine  (ZYPREXA ) 10 MG tablet Take 1 tablet (10 mg total) by mouth at bedtime. (Total of 15 mg olanzapine  daily at bedtime) 06/26/24   Cheryle Page, MD  OLANZapine  (ZYPREXA ) 10 MG tablet Take 1 tablet (10 mg total) by mouth at bedtime. (Total of 15 mg olanzapine  daily at bedtime) 06/26/24   Cheryle Page, MD  OLANZapine  (ZYPREXA ) 5 MG tablet Take 1 tablet (5 mg total) by mouth at bedtime. 06/26/24 07/26/24  Cheryle Page, MD  oxyCODONE  (ROXICODONE ) 5 MG immediate release tablet Take 1 tablet (5 mg total) by mouth every 6 (six) hours as needed  for severe pain (pain score 7-10). 06/26/24 06/26/25  Cheryle Page, MD  oxyCODONE  (ROXICODONE ) 5 MG immediate release tablet Take 1 tablet (5 mg total) by mouth every 6 (six) hours as needed for severe pain (pain score 7-10). 06/26/24 06/26/25  Cheryle Page, MD  Propylene Glycol (SYSTANE COMPLETE) 0.6 % SOLN Place 1 drop into both eyes daily.    [provider]  tamsulosin  (FLOMAX ) 0.4 MG CAPS capsule Take 1 capsule (0.4 mg total) by mouth 2 (two) times daily. 05/21/24   [place], Camellia PARAS, DO  traZODone  (DESYREL ) 100 MG tablet Take 2 tablets (200 mg total)  by mouth at bedtime. 06/26/24   Cheryle Page, MD  traZODone  (DESYREL ) 100 MG tablet Take 2 tablets (200 mg total) by mouth at bedtime. 06/26/24   Cheryle Page, MD    Allergies: Patient has no known allergies.    Review of Systems  Psychiatric/Behavioral:  Positive for confusion.     Updated Vital Signs BP 129/85   Pulse 79   Temp (!) 100.6 F (38.1 C) (Rectal)   Resp 14   Ht 5' 10 (1.778 m)   Wt 66.8 kg   SpO2 96%   BMI 21.13 kg/m   Physical Exam Vitals and nursing note reviewed.  Constitutional:      General: He is not in acute distress.    Appearance: Normal appearance. He is not ill-appearing.  HENT:     Head: Normocephalic and atraumatic.  Eyes:     Conjunctiva/sclera: Conjunctivae normal.     Pupils: Pupils are equal, round, and reactive to light.  Cardiovascular:     Rate and Rhythm: Normal rate.     Heart sounds: Normal heart sounds.  Pulmonary:     Effort: Pulmonary effort is normal. No respiratory distress.     Breath sounds: Normal breath sounds.  Musculoskeletal:     Cervical back: Normal range of motion and neck supple. No rigidity.  Skin:    Coloration: Skin is not jaundiced or pale.  Neurological:     General: No focal deficit present.     Mental Status: He is alert.     GCS: GCS eye subscore is 4. GCS verbal subscore is 5. GCS motor subscore is 6.     Cranial Nerves: No dysarthria or facial asymmetry.     Motor: No weakness, abnormal muscle tone, seizure activity or pronator drift.     Coordination: Finger-Nose-Finger Test and Heel to Calhoun Test normal.     Comments: Mostly nonverbal but is alert to self. Follows commands appropriately. Moving all extremities equally and normally     (all labs ordered are listed, but only abnormal results are displayed) Labs Reviewed  COMPREHENSIVE METABOLIC PANEL WITH GFR - Abnormal; Notable for the following components:      Result Value   Glucose, Bld 115 (*)    BUN 34 (*)    Creatinine, Ser 1.25 (*)     GFR, Estimated 53 (*)    All other components within normal limits  CBC WITH DIFFERENTIAL/PLATELET - Abnormal; Notable for the following components:   WBC 17.4 (*)    Hemoglobin 12.9 (*)    Neutro Abs 15.5 (*)    Lymphs Abs 0.3 (*)    Monocytes Absolute 1.4 (*)    All other components within normal limits  I-STAT CG4 LACTIC ACID, ED - Abnormal; Notable for the following components:   Lactic Acid, Venous 3.2 (*)    All other components within normal limits  RESP PANEL  BY RT-PCR (RSV, FLU A&B, COVID)  RVPGX2  CULTURE, BLOOD (ROUTINE X 2)  CULTURE, BLOOD (ROUTINE X 2)  PROTIME-INR  BLOOD GAS, VENOUS  URINALYSIS, W/ REFLEX TO CULTURE (INFECTION SUSPECTED)  I-STAT CG4 LACTIC ACID, ED    EKG: None  Radiology: CT Head Wo Contrast Result Date: 09/15/2024 EXAM: CT HEAD WITHOUT CONTRAST 09/15/2024 08:12:12 PM TECHNIQUE: CT of the head was performed without the administration of intravenous contrast. Automated exposure control, iterative reconstruction, and/or weight based adjustment of the mA/kV was utilized to reduce the radiation dose to as low as reasonably achievable. COMPARISON: 07/13/2024 CLINICAL HISTORY: Altered mental status. FINDINGS: BRAIN AND VENTRICLES: Generalized atrophic changes and chronic white matter ischemic changes are seen. No acute hemorrhage or acute infarct. No extra-axial collection. No mass effect or midline shift. ORBITS: No acute abnormality. SINUSES: No acute abnormality. SOFT TISSUES AND SKULL: No acute soft tissue abnormality. No skull fracture. IMPRESSION: 1. Chronic atrophic and ischemic changes without acute abnormality. Electronically signed by: Oneil Devonshire MD 09/15/2024 08:17 PM EST RP Workstation: [id number]   DG Chest Port 1 View Result Date: 09/15/2024 EXAM: 1 VIEW(S) XRAY OF THE CHEST 09/15/2024 07:18:00 PM COMPARISON: 04/25/2024 CLINICAL HISTORY: Questionable sepsis. Evaluate for abnormality. FINDINGS: LUNGS AND PLEURA: No focal pulmonary opacity. No  pleural effusion. No pneumothorax. HEART AND MEDIASTINUM: Aortic atherosclerosis. No acute abnormality of the cardiac and mediastinal silhouettes. BONES AND SOFT TISSUES: No acute osseous abnormality. IMPRESSION: 1. No acute cardiopulmonary abnormality. Electronically signed by: Dorethia Molt MD 09/15/2024 07:26 PM EST RP Workstation: [id number]    {Document cardiac monitor, telemetry assessment procedure when appropriate:32947} .Critical Care  Performed by: Minnie Tinnie BRAVO, PA Authorized by: Minnie Tinnie BRAVO, PA   Critical care provider statement:    Critical care time (minutes):  35   Critical care was necessary to treat or prevent imminent or life-threatening deterioration of the following conditions:  Sepsis   Critical care was time spent personally by me on the following activities:  Development of treatment plan with patient or surrogate, discussions with consultants, evaluation of patient's response to treatment, examination of patient, ordering and review of laboratory studies, ordering and review of radiographic studies, ordering and performing treatments and interventions, pulse oximetry, re-evaluation of patient's condition and review of old charts   Care discussed with: admitting provider      Medications Ordered in the ED  lactated ringers  infusion ( Intravenous New Bag/Given 09/15/24 2152)  lactated ringers  bolus 1,000 mL (1,000 mLs Intravenous New Bag/Given 09/15/24 1930)    And  lactated ringers  bolus 1,000 mL (1,000 mLs Intravenous New Bag/Given 09/15/24 2053)    And  lactated ringers  bolus 250 mL (has no administration in time range)  cefTRIAXone  (ROCEPHIN ) 1 g in sodium chloride  0.9 % 100 mL IVPB (0 g Intravenous Stopped 09/15/24 2025)  acetaminophen  (OFIRMEV ) IV 1,000 mg (0 mg Intravenous Stopped 09/15/24 1939)    Clinical Course as of 09/15/24 2219  Sat Sep 15, 2024  1913 Lactic Acid, Venous(!!): 3.2 Obtained prior to LR bolus. IVF per sepsis protocol. No hx of HF. Last  echo 2018 wo rEF. Appears dry on exam [LB]  2209 Creatinine(!): 1.25 AKI. 0.88 two months ago. Typically WNL [LB]  2210 WBC(!): 17.4 [LB]  2210 Temp(!): 100.6 F (38.1 C) IV tylenol  for fever. Would not tolerate suppository and is having active diarrhea. Would not swallow on command causing concern for PO intake [LB]    Clinical Course User Index [LB] Minnie Tinnie BRAVO, PA   {Click  here for ABCD2, HEART and other calculators REFRESH Note before signing:1}                              Medical Decision Making Amount and/or Complexity of Data Reviewed Labs: ordered. Decision-making details documented in ED Course. Radiology: ordered.  Risk OTC drugs. Prescription drug management. Decision regarding hospitalization.   Patient presents to the ED for concern of AMS, hematuria, this involves an extensive number of treatment options, and is a complaint that carries with it a high risk of complications and morbidity.  The differential diagnosis includes UTI, stone, PNA, intraabdominal infection, AKI, viral infection, sepsis   Co morbidities that complicate the patient evaluation  Dementia  See hpi   Additional history obtained:  Additional history obtained from Family, Nursing, and Outside Medical Records   External records from outside source obtained and reviewed including triage RN note, wife at bedside, hospice nurse   Lab Tests:  I Ordered, and personally interpreted labs.  The pertinent results include:   Resp panel neg Lactic 3.2   Imaging Studies ordered:  I ordered imaging studies including CT head, CXR  I independently visualized and interpreted imaging which showed  No acute intracranial abnormalities No acute cardiopulmonary pathology I agree with the radiologist interpretation   Cardiac Monitoring:  The patient was maintained on a cardiac monitor.  I personally viewed and interpreted the cardiac monitored which showed an underlying rhythm of:  ***   Medicines ordered and prescription drug management:  I ordered medication including rocephin , LR  for UTI  Reevaluation of the patient after these medicines showed that the patient improved I have reviewed the patients home medicines and have made adjustments as needed    Critical Interventions:  sepsis   Consultations Obtained:  I requested consultation with the Hospitalist Dr. Shona,  and discussed lab and imaging findings as well as pertinent plan - they accept patient for admission  I called hospice triage nurse Arnita regarding patient's hospice status and she reported that patient has been lethargic, having diarrhea since starting macrobid for UTI yesterday Patient wishes to receive treatment for acute conditions and be admitted if necessary   Problem List / ED Course:  AMS Patient has dementia.  Patient's baseline is having conversation with multiple words and occasionally sentences however is typically confused and only oriented to himself.  Today, he has been primarily nonverbal with family who reports this is not typical for patient Patient is able to follow commands appropriately.  He is moving all extremities equally Patient is only able to answer his name and does not answer any other questions  Fever of unknown origin Hematuria  Likely secondary to UTI Was on Macrobid for UTI.  I am unable to review the results of most recent UA Unable to provide patient tylenol  PO as he is currently unable to swallow 2/2 confusion and unable to receive tylenol  suppository as he is currently having diarrhea and unable to keep suppository in Provide Rocephin  for suspected UTI. Cultures in past have been pansensitive UA pending AKI Today creatinine 1.25 Baseline 0.79-0.88 over past four month  Diarrhea No obvious abdominal tenderness, rigidity, nor ecchymosis. Unlikely intraabdominal infection    Reevaluation:  After the interventions noted above, I reevaluated the  patient and found that they have :stayed the same    Dispostion:  After consideration of the diagnostic results and the patients response to treatment, I feel that the patent  would benefit from admission for sepsis likely 2/2 to UTI, failure of outpatient management of UTI, acute AMS in setting of infection.   Discussed ED workup, disposition with patient's wife expressed understanding agrees with plan.  All questions answered to her satisfaction.  She is agreeable to plan  Final diagnoses:  Sepsis, due to unspecified organism, unspecified whether acute organ dysfunction present [facility])  Delirium  Urinary tract infection with hematuria, site unspecified  AKI (acute kidney injury)    ED Discharge Orders     None      "

## 2024-09-15 NOTE — ED Notes (Signed)
 Pt experiencing continual diarrhea, changed brief x2 since arriving.  EDP aware,  unable to provide suppository

## 2024-09-15 NOTE — Progress Notes (Signed)
 Pt being followed by [facility] for Sepsis protocol.

## 2024-09-16 ENCOUNTER — Other Ambulatory Visit: Payer: Self-pay

## 2024-09-16 DIAGNOSIS — L8990 Pressure ulcer of unspecified site, unspecified stage: Secondary | ICD-10-CM | POA: Insufficient documentation

## 2024-09-16 LAB — MAGNESIUM: Magnesium: 2.1 mg/dL (ref 1.7–2.4)

## 2024-09-16 LAB — CBC
HCT: 36.4 % — ABNORMAL LOW (ref 39.0–52.0)
Hemoglobin: 11.5 g/dL — ABNORMAL LOW (ref 13.0–17.0)
MCH: 28.3 pg (ref 26.0–34.0)
MCHC: 31.6 g/dL (ref 30.0–36.0)
MCV: 89.4 fL (ref 80.0–100.0)
Platelets: 239 10*3/uL (ref 150–400)
RBC: 4.07 MIL/uL — ABNORMAL LOW (ref 4.22–5.81)
RDW: 15.5 % (ref 11.5–15.5)
WBC: 13.1 10*3/uL — ABNORMAL HIGH (ref 4.0–10.5)
nRBC: 0 % (ref 0.0–0.2)

## 2024-09-16 LAB — BASIC METABOLIC PANEL WITH GFR
Anion gap: 9 (ref 5–15)
BUN: 31 mg/dL — ABNORMAL HIGH (ref 8–23)
CO2: 27 mmol/L (ref 22–32)
Calcium: 9.2 mg/dL (ref 8.9–10.3)
Chloride: 108 mmol/L (ref 98–111)
Creatinine, Ser: 1.12 mg/dL (ref 0.61–1.24)
GFR, Estimated: >60 mL/min
Glucose, Bld: 101 mg/dL — ABNORMAL HIGH (ref 70–99)
Potassium: 4 mmol/L (ref 3.5–5.1)
Sodium: 143 mmol/L (ref 135–145)

## 2024-09-16 LAB — PHOSPHORUS: Phosphorus: 3.4 mg/dL (ref 2.5–4.6)

## 2024-09-16 LAB — C DIFFICILE QUICK SCREEN W PCR REFLEX
C Diff antigen: NEGATIVE
C Diff interpretation: NOT DETECTED
C Diff toxin: NEGATIVE

## 2024-09-16 MED ORDER — AMLODIPINE BESYLATE 10 MG PO TABS
5.0000 mg | ORAL_TABLET | Freq: Every day | ORAL | Status: DC
  Administered 2024-09-16 – 2024-09-18 (×2): 5 mg via ORAL
  Filled 2024-09-16 (×3): qty 1

## 2024-09-16 MED ORDER — LEVOTHYROXINE SODIUM 50 MCG PO TABS
75.0000 ug | ORAL_TABLET | Freq: Every day | ORAL | Status: DC
  Administered 2024-09-16 – 2024-09-18 (×3): 75 ug via ORAL
  Filled 2024-09-16 (×3): qty 1

## 2024-09-16 MED ORDER — TRAZODONE HCL 100 MG PO TABS
200.0000 mg | ORAL_TABLET | Freq: Every day | ORAL | Status: DC
  Administered 2024-09-16 – 2024-09-17 (×2): 200 mg via ORAL
  Filled 2024-09-16 (×2): qty 2

## 2024-09-16 MED ORDER — DIVALPROEX SODIUM 125 MG PO CSDR
250.0000 mg | DELAYED_RELEASE_CAPSULE | Freq: Two times a day (BID) | ORAL | Status: DC
  Administered 2024-09-16 – 2024-09-18 (×4): 250 mg via ORAL
  Filled 2024-09-16 (×5): qty 2

## 2024-09-16 MED ORDER — TAMSULOSIN HCL 0.4 MG PO CAPS
0.4000 mg | ORAL_CAPSULE | Freq: Two times a day (BID) | ORAL | Status: DC
  Administered 2024-09-16 – 2024-09-18 (×4): 0.4 mg via ORAL
  Filled 2024-09-16 (×5): qty 1

## 2024-09-16 MED ORDER — HALOPERIDOL 2 MG PO TABS
2.0000 mg | ORAL_TABLET | Freq: Four times a day (QID) | ORAL | Status: DC | PRN
  Administered 2024-09-16: 2 mg via ORAL
  Filled 2024-09-16 (×2): qty 1

## 2024-09-16 MED ORDER — HALOPERIDOL LACTATE 5 MG/ML IJ SOLN
2.0000 mg | Freq: Once | INTRAMUSCULAR | Status: AC
  Administered 2024-09-16: 2 mg via INTRAVENOUS
  Filled 2024-09-16: qty 1

## 2024-09-16 MED ORDER — LOSARTAN POTASSIUM 50 MG PO TABS
50.0000 mg | ORAL_TABLET | Freq: Every day | ORAL | Status: DC
  Administered 2024-09-16 – 2024-09-18 (×2): 50 mg via ORAL
  Filled 2024-09-16 (×3): qty 1

## 2024-09-16 MED ORDER — CITALOPRAM HYDROBROMIDE 10 MG PO TABS
20.0000 mg | ORAL_TABLET | Freq: Every day | ORAL | Status: DC
  Administered 2024-09-16 – 2024-09-17 (×2): 20 mg via ORAL
  Filled 2024-09-16 (×2): qty 2

## 2024-09-16 MED ORDER — OLANZAPINE 5 MG PO TABS
15.0000 mg | ORAL_TABLET | Freq: Every day | ORAL | Status: DC
  Administered 2024-09-16 – 2024-09-17 (×2): 15 mg via ORAL
  Filled 2024-09-16 (×2): qty 3

## 2024-09-16 MED ORDER — LACTATED RINGERS IV SOLN: INTRAVENOUS | Status: DC

## 2024-09-16 NOTE — Progress Notes (Signed)
 Julian [name] [facility] Hospitalized Hospice Note   [name] is a current [facility] patient with a terminal diagnosis of cerebrovascular disease who resides at [facility]. Patient spouse requested EMS due to change in mental status and diarrhea. She preferred to have patient evaluated in ED verses nurse visit at facility. He has been admitted to hospital with diagnosis Severe sepsis secondary to presumed UTI. Per Dr. Cassandria Serve with [facility] this is a related admission.   No major events overnight or this morning.  Patient continues to have liquid, foul-smelling diarrhea. Awake but only oriented to self. Called and spoke with patient wife who is at bedside with him. She has no questions about plan. She verbalized her desire to get patient into a better nursing home. States that she has been researching facilities and none seem to be good. Liaison offered to email home team including social worker, however wife states they are aware and is also helping her as best as they can.   He is inpatient appropriate with the need for IVAB and treatment of acute medical complication.   Vital Signs: 97.8/74/15    194/85    spO2 95% room air   I&O: none documented   Abnormal Labs:  09/15/24 18:45 Comprehensive metabolic panel with GFR: Rpt ! Glucose: 115 (H) BUN: 34 (H) Creatinine: 1.25 (H) GFR, Estimated: 53 (L) WBC: 17.4 (H) Hemoglobin: 12.9 (L) NEUT#: 15.5 (H) Lymphs Abs: 0.3 (L) Monocyte #: 1.4 (H)  Culture, blood (Routine X 2) w Reflex to ID Panel: Pending  09/15/24 18:53 Lactic Acid, Venous: 3.2 (HH)  09/15/24 22:10 pO2, Ven: 47 (H)  09/15/24 22:20 Lactic Acid, Venous: 2.9 (HH)  09/15/24 23:16 Urinalysis, w/ Reflex to Culture (Infection Suspected): Rpt ! Bilirubin Urine: SMALL ! Color, Urine: AMBER ! Leukocytes,Ua: TRACE ! Protein: 30 ! Bacteria, UA: FEW !  Urine Culture: Rpt: Pending  09/16/24 03:49 Basic metabolic panel with GFR:  Rpt ! Glucose: 101 (H) BUN: 31 (H) WBC: 13.1 (H) RBC: 4.07 (L) Hemoglobin: 11.5 (L) HCT: 36.4 (L)  Diagnostics: DG Chest Port 1 View   Date: 09/15/2024       IMPRESSION: 1. No acute cardiopulmonary abnormality.   Electronically signed by: Dorethia Molt MD 09/15/2024 07:26 PM EST RP Workstation: [id number]   CT Head Wo Contrast   Date: 09/15/2024   IMPRESSION: 1. Chronic atrophic and ischemic changes without acute abnormality.   Electronically signed by: Oneil Devonshire MD 09/15/2024 08:17 PM EST RP Workstation: [id number]    IV/PRN Meds:  Haldol  2mg  IV x1, Ofirmev  1000mg  IV x1, Rocephin  1g IV x1, Lactated Ringers  1000mL Bolus IV x2, Lactated Ringers  250mL Bolus IV x1,    Current plan as per Gonfa, Taye T, MD on 1.25.26 Assessment and plan: Severe sepsis secondary to presumed UTI, POA: Febrile with tachypnea, leukocytosis and lactic acidosis. Started on Macrobid 09/13/24 for UTI outpatient.  UA with trace LE and few bacteria.  He is also having ongoing diarrhea.  Abdominal exam benign.  Sepsis physiology improving.  Hemodynamically stable. -Follow IV ceftriaxone  pending cultures.  Will not escalate antibiotics since he is improving. -Follow cultures, C. difficile and GIP -Continue IV fluid hydration   Lactic acidosis secondary to severe sepsis: Improved -Continue IV fluid hydration and IV antibiotics. -Continue trending lactic acid.   Acute metabolic encephalopathy: Likely due to sepsis Dementia with behavioral disturbance: Currently awake and oriented to self.  Likely his baseline. -CT head without acute finding. -Manage infection as above. -Continue IV fluid  for hydration. -Hold home oxycodone  and Ativan  -Resume home Zyprexa .  Hold home scheduled Haldol .  Continue as needed Haldol . -Resume home Depakote  and trazodone .   Diarrhea: Patient with enormous liquidy diarrhea.  Abdominal exam benign.  No prior history of C. difficile. - Check C. difficile and GIP - Enteric  precaution pending results   AKI: b/l Cr ~0.7-0.8.  Likely due to poor p.o. intake and LOC in the setting of diarrhea.  AKI resolving. -Continue IV fluid hydration -Decrease home losartan    Uncontrolled hypertension: BP elevated. -Resume home amlodipine  -Resume home losartan  at half dose.   Concern for dysphagia: RN noted coughing when she gave him some PO - N.p.o. pending SLP eval   Goals of care: Patient with advanced dementia.  Followed by hospice outpatient.  DNR. -Follow-up palliative recommendation.  Consulted on admission.    Discharge Planning: Ongoing  Family Contact: spoke with wife  IDT: updated   Goals of Care: DNR  Transfer summary and Med list sent to be placed under Media tab.   Should patient need ambulance transfer at discharge- please use [facility]) as they contract this service for our active hospice patients.   Thank you for the opportunity to participate in this patient's care, please don't hesitate to call for any hospice related questions or concerns.     Nat Babe, BSN, [facility] Liaison  [phone]

## 2024-09-16 NOTE — Plan of Care (Signed)
   Problem: Safety: Goal: Ability to remain free from injury will improve Outcome: Progressing   Problem: Skin Integrity: Goal: Risk for impaired skin integrity will decrease Outcome: Progressing

## 2024-09-16 NOTE — Progress Notes (Signed)
 " PROGRESS NOTE  Julian [name] FMW:[id number] DOB: 09-19-1925   PCP: Loreli Elsie [name], MD  Patient is from: Memory care.  Minimally ambulates with rolling walker at baseline.  Followed by hospice.  DOA: 09/15/2024 LOS: 1  Chief complaints Chief Complaint  Patient presents with   Altered Mental Status     Brief Narrative / Interim history: 89 year old M with PMH of dementia with behavioral disturbance, CAD, HTN, HLD and TIA brought to ED by EMS from memory care due to altered mental status and acute hematuria.  Per wife, patient was talking and alert when she saw him in the morning but visibly shaking and nonverbal when she returned about 4 PM.  Patient was recently started on Macrobid for possible UTI.   In ED, febrile to 100.6.  WBC 17.4 with left shift.  COVID-19, influenza and RSV PCR nonreactive.  CT head without acute finding.  Code sepsis activated.  Lactic acid, UA and cultures ordered.  Started on IV fluid and ceftriaxone  and admitted with working diagnosis of severe sepsis due to possible urinary tract infection.     Subjective: Seen and examined earlier this morning.  No major events overnight or this morning.  Patient with very liquidy and foul-smelling diarrhea.  No blood in the stool.  He is awake but only oriented to self.  Talked to patient's wife and other family member who was waiting outside the room while patient was getting cleaned up.   Assessment and plan: Severe sepsis secondary to presumed UTI, POA: Febrile with tachypnea, leukocytosis and lactic acidosis. Started on Macrobid 09/13/24 for UTI outpatient.  UA with trace LE and few bacteria.  He is also having ongoing diarrhea.  Abdominal exam benign.  Sepsis physiology improving.  Hemodynamically stable. -Follow IV ceftriaxone  pending cultures.  Will not escalate antibiotics since he is improving. -Follow cultures, C. difficile and GIP -Continue IV fluid hydration   Lactic acidosis secondary to severe  sepsis: Improved -Continue IV fluid hydration and IV antibiotics. -Continue trending lactic acid.   Acute metabolic encephalopathy: Likely due to sepsis Dementia with behavioral disturbance: Currently awake and oriented to self.  Likely his baseline. -CT head without acute finding. -Manage infection as above. -Continue IV fluid for hydration. -Hold home oxycodone  and Ativan  -Resume home Zyprexa .  Hold home scheduled Haldol .  Continue as needed Haldol . -Resume home Depakote  and trazodone .  Diarrhea: Patient with enormous liquidy diarrhea.  Abdominal exam benign.  No prior history of C. difficile. - Check C. difficile and GIP - Enteric precaution pending results  AKI: b/l Cr ~0.7-0.8.  Likely due to poor p.o. intake and LOC in the setting of diarrhea.  AKI resolving. -Continue IV fluid hydration -Decrease home losartan    Uncontrolled hypertension: BP elevated. -Resume home amlodipine  -Resume home losartan  at half dose.  Concern for dysphagia: RN noted coughing when she gave him some PO - N.p.o. pending SLP eval  Goals of care: Patient with advanced dementia.  Followed by hospice outpatient.  DNR. -Follow-up palliative recommendation.  Consulted on admission.  Body mass index is 21.13 kg/m.         Pressure skin injury: Present on admission. Wound 09/16/24 0020 Pressure Injury Coccyx Right;Mid;Left Stage 3 -  Full thickness tissue loss. Subcutaneous fat may be visible but bone, tendon or muscle are NOT exposed. (Active)   DVT prophylaxis:  enoxaparin  (LOVENOX ) injection 40 mg Start: 09/16/24 1000  Code Status: DNR. Family Communication: Updated patient's wife and other family member at the bedside.  Level of care: Progressive Status is: Inpatient Remains inpatient appropriate because: Severe sepsis due to possible UTI, ongoing diarrhea   Final disposition: Yet to be determined   55 minutes with more than 50% spent in reviewing records, counseling patient/family and  coordinating care.  Consultants:  Palliative medicine  Procedures: None  Microbiology summarized: COVID-19, influenza and RSV PCR nonreactive Blood culture NGTD Urine culture pending C. difficile pending GIP pending  Objective: Vitals:   09/16/24 0015 09/16/24 0425 09/16/24 0814 09/16/24 0924  BP: (!) 123/98 (!) 141/68 (!) 194/85 (!) 160/66  Pulse: 73 70 74 65  Resp: 18 20 15    Temp: 97.8 F (36.6 C) 97.7 F (36.5 C) 97.8 F (36.6 C)   TempSrc:      SpO2: 97% 99% 95% 97%  Weight:      Height:        Examination:  GENERAL: No apparent distress.  Nontoxic. HEENT: MMM.  Vision and hearing grossly intact.  NECK: Supple.  No apparent JVD.  RESP:  No IWOB.  Fair aeration bilaterally. CVS:  RRR. Heart sounds normal.  ABD/GI/GU: BS+. Abd soft, NTND.  Rectal tube in place. MSK/EXT:  Moves extremities.  Significant muscle mass and subcu fat loss. SKIN: no apparent skin lesion or wound NEURO: AA.  Oriented to self.  No apparent focal neuro deficit. PSYCH: Calm. Normal affect.   Sch Meds:  Scheduled Meds:  amLODipine   5 mg Oral Daily   citalopram   20 mg Oral QHS   divalproex   250 mg Oral Q12H   enoxaparin  (LOVENOX ) injection  40 mg Subcutaneous Q24H   levothyroxine   75 mcg Oral Q0600   losartan   50 mg Oral Daily   OLANZapine   15 mg Oral QHS   tamsulosin   0.4 mg Oral BID   traZODone   200 mg Oral QHS   Continuous Infusions:  cefTRIAXone  (ROCEPHIN )  IV 2 g (09/16/24 1000)   lactated ringers  125 mL/hr at 09/16/24 1026   PRN Meds:.acetaminophen , haloperidol , melatonin, polyethylene glycol, prochlorperazine   Antimicrobials: Anti-infectives (From admission, onward)    Start     Dose/Rate Route Frequency Ordered Stop   09/16/24 0800  cefTRIAXone  (ROCEPHIN ) 2 g in sodium chloride  0.9 % 100 mL IVPB        2 g 200 mL/hr over 30 Minutes Intravenous Every 24 hours 09/15/24 2234     09/15/24 1915  cefTRIAXone  (ROCEPHIN ) 1 g in sodium chloride  0.9 % 100 mL IVPB        1  g 200 mL/hr over 30 Minutes Intravenous  Once 09/15/24 1900 09/15/24 2025        I have personally reviewed the following labs and images: CBC: Recent Labs  Lab 09/15/24 1845 09/16/24 0349  WBC 17.4* 13.1*  NEUTROABS 15.5*  --   HGB 12.9* 11.5*  HCT 41.7 36.4*  MCV 90.5 89.4  PLT 256 239   BMP &GFR Recent Labs  Lab 09/15/24 1845 09/16/24 0349  NA 145 143  K 4.2 4.0  CL 107 108  CO2 25 27  GLUCOSE 115* 101*  BUN 34* 31*  CREATININE 1.25* 1.12  CALCIUM  9.4 9.2  MG  --  2.1  PHOS  --  3.4   Estimated Creatinine Clearance: 36.4 mL/min (by C-G formula based on SCr of 1.12 mg/dL). Liver & Pancreas: Recent Labs  Lab 09/15/24 1845  AST 20  ALT 17  ALKPHOS 99  BILITOT 0.7  PROT 7.4  ALBUMIN 3.8   No results for input(s): LIPASE, AMYLASE  in the last 168 hours. No results for input(s): AMMONIA in the last 168 hours. Diabetic: No results for input(s): HGBA1C in the last 72 hours. No results for input(s): GLUCAP in the last 168 hours. Cardiac Enzymes: No results for input(s): CKTOTAL, CKMB, CKMBINDEX, TROPONINI in the last 168 hours. Recent Labs    04/26/24 0407  PROBNP 520.0*   Coagulation Profile: Recent Labs  Lab 09/15/24 1845  INR 1.0   Thyroid  Function Tests: No results for input(s): TSH, T4TOTAL, FREET4, T3FREE, THYROIDAB in the last 72 hours. Lipid Profile: No results for input(s): CHOL, HDL, LDLCALC, TRIG, CHOLHDL, LDLDIRECT in the last 72 hours. Anemia Panel: No results for input(s): VITAMINB12, FOLATE, FERRITIN, TIBC, IRON, RETICCTPCT in the last 72 hours. Urine analysis:    Component Value Date/Time   COLORURINE AMBER (A) 09/15/2024 2316   APPEARANCEUR CLEAR 09/15/2024 2316   LABSPEC 1.020 09/15/2024 2316   PHURINE 5.0 09/15/2024 2316   GLUCOSEU NEGATIVE 09/15/2024 2316   HGBUR NEGATIVE 09/15/2024 2316   BILIRUBINUR SMALL (A) 09/15/2024 2316   KETONESUR NEGATIVE 09/15/2024 2316    PROTEINUR 30 (A) 09/15/2024 2316   NITRITE NEGATIVE 09/15/2024 2316   LEUKOCYTESUR TRACE (A) 09/15/2024 2316   Sepsis Labs: Invalid input(s): PROCALCITONIN, LACTICIDVEN  Microbiology: Recent Results (from the past 240 hours)  Blood Culture (routine x 2)     Status: None (Preliminary result)   Collection Time: 09/15/24  6:45 PM   Specimen: BLOOD LEFT FOREARM  Result Value Ref Range Status   Specimen Description   Final    BLOOD LEFT FOREARM Performed at [facility], [address] [place], [place] [address]    Special Requests   Final    BOTTLES DRAWN AEROBIC AND ANAEROBIC Blood Culture adequate volume Performed at [facility], [address] [place], [place] [address]    Culture   Final    NO GROWTH < 12 HOURS Performed at [facility], [address] [place], [place] [address]    Report Status PENDING  Incomplete  Blood Culture (routine x 2)     Status: None (Preliminary result)   Collection Time: 09/15/24  8:06 PM   Specimen: Right Antecubital; Blood  Result Value Ref Range Status   Specimen Description   Final    RIGHT ANTECUBITAL Performed at [facility], [address], [place], [place] [address]    Special Requests   Final    BOTTLES DRAWN AEROBIC AND ANAEROBIC Blood Culture adequate volume Performed at [facility], [address] [place], [place] [address]    Culture   Final    NO GROWTH < 12 HOURS Performed at [facility], [address], [place], [place] [address]    Report Status PENDING  Incomplete  Resp panel by RT-PCR (RSV, Flu A&B, Covid) Anterior Nasal Swab     Status: None   Collection Time: 09/15/24  8:53 PM   Specimen: Anterior Nasal Swab  Result Value Ref Range Status   SARS Coronavirus 2 by RT PCR NEGATIVE NEGATIVE Final    Comment: (NOTE) SARS-CoV-2 target nucleic acids are NOT DETECTED.  The SARS-CoV-2 RNA is generally detectable in upper  respiratory specimens during the acute phase of infection. The lowest concentration of SARS-CoV-2 viral copies this assay can detect is 138 copies/mL. A negative result does not preclude SARS-Cov-2 infection and should not be used as the sole basis for treatment or other patient management decisions. A negative result  may occur with  improper specimen collection/handling, submission of specimen other than nasopharyngeal swab, presence of viral mutation(s) within the areas targeted by this assay, and inadequate number of viral copies(<138 copies/mL). A negative result must be combined with clinical observations, patient history, and epidemiological information. The expected result is Negative.  Fact Sheet for Patients:  bloggercourse.com  Fact Sheet for Healthcare Providers:  seriousbroker.it  This test is no t yet approved or cleared by the [place]  FDA and  has been authorized for detection and/or diagnosis of SARS-CoV-2 by FDA under an Emergency Use Authorization (EUA). This EUA will remain  in effect (meaning this test can be used) for the duration of the COVID-19 declaration under Section 564(b)(1) of the Act, 21 U.S.C.section 360bbb-3(b)(1), unless the authorization is terminated  or revoked sooner.       Influenza A by PCR NEGATIVE NEGATIVE Final   Influenza B by PCR NEGATIVE NEGATIVE Final    Comment: (NOTE) The Xpert Xpress SARS-CoV-2/FLU/RSV plus assay is intended as an aid in the diagnosis of influenza from Nasopharyngeal swab specimens and should not be used as a sole basis for treatment. Nasal washings and aspirates are unacceptable for Xpert Xpress SARS-CoV-2/FLU/RSV testing.  Fact Sheet for Patients: bloggercourse.com  Fact Sheet for Healthcare Providers: seriousbroker.it  This test is not yet approved or cleared by the [place]  FDA and has been  authorized for detection and/or diagnosis of SARS-CoV-2 by FDA under an Emergency Use Authorization (EUA). This EUA will remain in effect (meaning this test can be used) for the duration of the COVID-19 declaration under Section 564(b)(1) of the Act, 21 U.S.C. section 360bbb-3(b)(1), unless the authorization is terminated or revoked.     Resp Syncytial Virus by PCR NEGATIVE NEGATIVE Final    Comment: (NOTE) Fact Sheet for Patients: bloggercourse.com  Fact Sheet for Healthcare Providers: seriousbroker.it  This test is not yet approved or cleared by the [place]  FDA and has been authorized for detection and/or diagnosis of SARS-CoV-2 by FDA under an Emergency Use Authorization (EUA). This EUA will remain in effect (meaning this test can be used) for the duration of the COVID-19 declaration under Section 564(b)(1) of the Act, 21 U.S.C. section 360bbb-3(b)(1), unless the authorization is terminated or revoked.  Performed at [facility], [address] [place], [place] [address]     Radiology Studies: CT Head Wo Contrast Result Date: 09/15/2024 EXAM: CT HEAD WITHOUT CONTRAST 09/15/2024 08:12:12 PM TECHNIQUE: CT of the head was performed without the administration of intravenous contrast. Automated exposure control, iterative reconstruction, and/or weight based adjustment of the mA/kV was utilized to reduce the radiation dose to as low as reasonably achievable. COMPARISON: 07/13/2024 CLINICAL HISTORY: Altered mental status. FINDINGS: BRAIN AND VENTRICLES: Generalized atrophic changes and chronic white matter ischemic changes are seen. No acute hemorrhage or acute infarct. No extra-axial collection. No mass effect or midline shift. ORBITS: No acute abnormality. SINUSES: No acute abnormality. SOFT TISSUES AND SKULL: No acute soft tissue abnormality. No skull fracture. IMPRESSION: 1. Chronic atrophic and ischemic changes  without acute abnormality. Electronically signed by: Oneil Devonshire MD 09/15/2024 08:17 PM EST RP Workstation: [id number]   DG Chest Port 1 View Result Date: 09/15/2024 EXAM: 1 VIEW(S) XRAY OF THE CHEST 09/15/2024 07:18:00 PM COMPARISON: 04/25/2024 CLINICAL HISTORY: Questionable sepsis. Evaluate for abnormality. FINDINGS: LUNGS AND PLEURA: No focal pulmonary opacity. No pleural effusion. No pneumothorax. HEART AND MEDIASTINUM: Aortic atherosclerosis. No acute abnormality of the cardiac and mediastinal silhouettes. BONES AND SOFT TISSUES:  No acute osseous abnormality. IMPRESSION: 1. No acute cardiopulmonary abnormality. Electronically signed by: Dorethia Molt MD 09/15/2024 07:26 PM EST RP Workstation: [id number]      Ujbz T. Lasundra Hascall [facility]  If 7PM-7AM, please contact night-coverage [email or web address] 09/16/2024, 10:32 AM   "

## 2024-09-17 LAB — URINE CULTURE

## 2024-09-17 LAB — GASTROINTESTINAL PANEL BY PCR, STOOL (REPLACES STOOL CULTURE)

## 2024-09-17 LAB — RENAL FUNCTION PANEL
Albumin: 3 g/dL — ABNORMAL LOW (ref 3.5–5.0)
Anion gap: 8 (ref 5–15)
BUN: 22 mg/dL (ref 8–23)
CO2: 27 mmol/L (ref 22–32)
Calcium: 8.6 mg/dL — ABNORMAL LOW (ref 8.9–10.3)
Chloride: 105 mmol/L (ref 98–111)
Creatinine, Ser: 0.85 mg/dL (ref 0.61–1.24)
GFR, Estimated: >60 mL/min
Glucose, Bld: 88 mg/dL (ref 70–99)
Phosphorus: 2.7 mg/dL (ref 2.5–4.6)
Potassium: 3.6 mmol/L (ref 3.5–5.1)
Sodium: 139 mmol/L (ref 135–145)

## 2024-09-17 LAB — MAGNESIUM: Magnesium: 1.7 mg/dL (ref 1.7–2.4)

## 2024-09-17 LAB — CBC
HCT: 33.4 % — ABNORMAL LOW (ref 39.0–52.0)
Hemoglobin: 10.3 g/dL — ABNORMAL LOW (ref 13.0–17.0)
MCH: 27.5 pg (ref 26.0–34.0)
MCHC: 30.8 g/dL (ref 30.0–36.0)
MCV: 89.1 fL (ref 80.0–100.0)
Platelets: 201 10*3/uL (ref 150–400)
RBC: 3.75 MIL/uL — ABNORMAL LOW (ref 4.22–5.81)
RDW: 15.3 % (ref 11.5–15.5)
WBC: 9.4 10*3/uL (ref 4.0–10.5)
nRBC: 0 % (ref 0.0–0.2)

## 2024-09-17 LAB — LACTIC ACID, PLASMA: Lactic Acid, Venous: 0.9 mmol/L (ref 0.5–1.9)

## 2024-09-17 MED ORDER — LOPERAMIDE HCL 2 MG PO CAPS
2.0000 mg | ORAL_CAPSULE | ORAL | Status: DC | PRN
  Filled 2024-09-17: qty 1

## 2024-09-17 NOTE — Plan of Care (Signed)
" °  Problem: Clinical Measurements: Goal: Ability to maintain clinical measurements within normal limits will improve Outcome: Progressing   Problem: Elimination: Goal: Will not experience complications related to bowel motility Outcome: Progressing Goal: Will not experience complications related to urinary retention Outcome: Progressing   Problem: Safety: Goal: Ability to remain free from injury will improve Outcome: Progressing   Problem: Skin Integrity: Goal: Risk for impaired skin integrity will decrease Outcome: Progressing   "

## 2024-09-17 NOTE — Progress Notes (Signed)
 OT Cancellation/Screen Note  Patient Details Name: Julian [name] MRN: [id number] DOB: 1926/06/01   Cancelled Treatment:    Reason Eval/Treat Not Completed: OT screened, no needs identified, will sign off Spoke with PT after evaluation who reports that pt has no acute OT needs. Thank you for the referral.   Leita Howell, OTR/L,CBIS  Supplemental OT - [facility] and [facility] Secure Chat Preferred   09/17/2024, 10:57 AM

## 2024-09-17 NOTE — Evaluation (Signed)
 Clinical/Bedside Swallow Evaluation Patient Details  Name: Julian [name] MRN: [id number] Date of Birth: January 23, 1926  Today's Date: 09/17/2024 Time: SLP Start Time (ACUTE ONLY): 1220 SLP Stop Time (ACUTE ONLY): 1240 SLP Time Calculation (min) (ACUTE ONLY): 20 min  Past Medical History:  Past Medical History:  Diagnosis Date   Alzheimer's dementia (HCC)    Anemia    Dementia (HCC)    Hyperlipidemia    Hypertension    Prostate cancer (HCC)    Past Surgical History:  Past Surgical History:  Procedure Laterality Date   CARPAL TUNNEL RELEASE Left    HERNIA REPAIR     MID 80'S   KNEE SURGERY Left    PROSTATECTOMY  [id number]   HPI:  89 year old M with PMH of dementia with behavioral disturbance, CAD, HTN, HLD and TIA brought to ED by EMS from memory care due to altered mental status and acute hematuria. Wife reports they have been struggling with dementia related dysphagia and have tried thickened liquids, but pt refused them.    Assessment / Plan / Recommendation  Clinical Impression  Pt demonstrates probable aspiration events due to decreased awareness of PO; he is lethargic and requires careful hand feeding to accept sips or bites. Wife is clear that her goals for her husband are dignity and comfort. Recommend puree/thin while mentation altered. Pt can upgrade solid texture whenever wife desires it.   He has oral holding and delayed coughing with most trials. Wife reports she has been aware of this problem, though it is worse now due to AMS.  She does not want to restrict PO, does not want thickened liquids or feeding tubes. SLP showed wife some strategies like positioning and tactile cues that can help pt be more aware when he is offered food and drink. Given AMS will offer purees and thin liquids, when alert. If very lethargic, hold PO. Meds crushed in puree. Ok to [employer] at any time. Discussed with wife. No further acute needs while admitted.   SLP Visit Diagnosis: Dysphagia,  oropharyngeal phase (R13.12);Dysphagia, unspecified (R13.10)    Aspiration Risk  Severe aspiration risk    Diet Recommendation           Other Recommendations Oral Care Recommendations: Oral care BID     Swallow Evaluation Recommendations Recommendations: PO diet PO Diet Recommendation: Dysphagia 1 (Pureed);Thin liquids (Level 0) Liquid Administration via: Straw;Spoon Medication Administration: Crushed with puree Supervision: Full assist for feeding Postural changes: Position pt fully upright for meals Oral care recommendations: Oral care BID (2x/day)   Assistance Recommended at Discharge Frequent or constant Supervision/Assistance  Functional Status Assessment Patient has had a recent decline in their functional status and/or demonstrates limited ability to make significant improvements in function in a reasonable and predictable amount of time  Frequency and Duration            Prognosis Prognosis for improved oropharyngeal function: Guarded Barriers to Reach Goals: Cognitive deficits;Severity of deficits      Swallow Study   General HPI: 89 year old M with PMH of dementia with behavioral disturbance, CAD, HTN, HLD and TIA brought to ED by EMS from memory care due to altered mental status and acute hematuria. Wife reports they have been struggling with dementia related dysphagia and have tried thickened liquids, but pt refused them. Type of Study: Bedside Swallow Evaluation Previous Swallow Assessment: none Diet Prior to this Study: Dysphagia 2 (finely chopped);Thin liquids (Level 0) Temperature Spikes Noted: No History of Recent Intubation: No Behavior/Cognition: Lethargic/Drowsy Oral  Cavity Assessment: Within Functional Limits Oral Care Completed by SLP: No Vision: Impaired for self-feeding Patient Positioning: Upright in bed Baseline Vocal Quality: Low vocal intensity Volitional Cough: Cognitively unable to elicit Volitional Swallow: Unable to elicit     Oral/Motor/Sensory Function Overall Oral Motor/Sensory Function: Generalized oral weakness   Ice Chips Ice chips: Impaired Presentation: Spoon Oral Phase Functional Implications: Oral residue;Prolonged oral transit Pharyngeal Phase Impairments: Wet Vocal Quality;Cough - Immediate;Cough - Delayed   Thin Liquid Thin Liquid: Impaired Presentation: Straw;Spoon Oral Phase Impairments: Poor awareness of bolus;Reduced labial seal Oral Phase Functional Implications: Oral holding;Oral residue;Prolonged oral transit Pharyngeal  Phase Impairments: Cough - Immediate;Cough - Delayed    Nectar Thick Nectar Thick Liquid: Not tested   Honey Thick Honey Thick Liquid: Not tested   Puree Puree: Not tested   Solid     Solid: Not tested      Geordan Xu, Consuelo Fitch 09/17/2024,1:29 PM

## 2024-09-17 NOTE — Progress Notes (Addendum)
 Patient being followed and managed by [facility], this is a related admission. Inpatient Palliative Care will sign off unless there is a specific request, support need or patient/spouse no longer desire hospice services.  Almarie General, DO Palliative Medicine

## 2024-09-17 NOTE — Progress Notes (Signed)
 " PROGRESS NOTE  Julian [name] DOB: 03/21/1926   PCP: Loreli Elsie [name], MD  Patient is from: Memory care.  Minimally ambulates with rolling walker at baseline.  Followed by hospice.  DOA: 09/15/2024 LOS: 2  Chief complaints Chief Complaint  Patient presents with   Altered Mental Status     Brief Narrative / Interim history: 89 year old M with PMH of dementia with behavioral disturbance, CAD, HTN, HLD and TIA brought to ED by EMS from memory care due to altered mental status and acute hematuria.  Per wife, patient was talking and alert when she saw him in the morning but visibly shaking and nonverbal when she returned about 4 PM.  Patient was recently started on Macrobid for possible UTI.   In ED, febrile to 100.6.  WBC 17.4 with left shift.  COVID-19, influenza and RSV PCR nonreactive.  CT head without acute finding.  Code sepsis activated.  Lactic acid, UA and cultures ordered.  Started on IV fluid and ceftriaxone  and admitted with working diagnosis of severe sepsis due to possible urinary tract infection.   Blood cultures NGTD.  Urine culture with multiple species.  Patient with ongoing diarrhea.  C. difficile negative.  GIP pending.  Remains on IV fluid.    Subjective: Seen and examined earlier this morning.  No major events overnight or this morning.  Sleepy this morning.  Per wife, tends to sleep in the morning because he does not sleep at night.  Per RN, he had some bite of his breakfast this morning.  He is arousable to noxious stimuli.   Assessment and plan: Severe sepsis secondary to presumed UTI, POA: Febrile with tachypnea, leukocytosis and lactic acidosis. Started on Macrobid 09/13/24 for UTI outpatient.  UA with trace LE and few bacteria.  Culture with multiple species.  Blood culture NGTD.  He is also having ongoing diarrhea.  Abdominal exam benign.  C. difficile negative.  Sepsis physiology improving.  Hemodynamically stable. -Follow IV ceftriaxone   empirically.  -Follow GIP. -Continue IV fluid hydration with ongoing diarrhea   Lactic acidosis secondary to severe sepsis: Resolved. -Continue IV fluid hydration and IV antibiotics.   Acute metabolic encephalopathy: Likely due to sepsis and delirium Dementia with behavioral disturbance/delirium: Currently sleepy but patient was awake earlier eating breakfast. -CT head without acute finding. -Manage infection as above. -Continue IV fluid for hydration. -Hold home oxycodone  and Ativan  -Continue home Zyprexa , Depakote  and trazodone .  May consider decreasing if increased somnolence. -Hold home scheduled Haldol .  Continue as needed Haldol .  Diarrhea: Patient with liquidy diarrhea.  Abdominal exam benign.  C. difficile negative - Follow GI. - Enteric precaution pending results - Imodium  as needed - Continue IV fluid  AKI: b/l Cr ~0.7-0.8.  Likely due to poor p.o. intake and LOC in the setting of diarrhea.  AKI resolved. -Continue IV fluid hydration - Continue decreased dose of losartan .    Uncontrolled hypertension: Normotensive. - Continue home amlodipine  and decrease dose of losartan .  Concern for dysphagia: Per RN, coughing with liquid.  Per wife, or dysphagia 3 diet - Dysphagia to diet pending SLP eval - Meds in applesauce.  Goals of care: Patient with advanced dementia.  Followed by hospice outpatient.  DNR.  Wife is interested in nursing home placement.  Might be LTC - Follow-up palliative care.  Body mass index is 21.13 kg/m.         Pressure skin injury: Present on admission. Wound 09/16/24 0020 Pressure Injury Coccyx Right;Mid;Left Stage 3 -  Full thickness tissue loss. Subcutaneous fat may be visible but bone, tendon or muscle are NOT exposed. (Active)   DVT prophylaxis:  enoxaparin  (LOVENOX ) injection 40 mg Start: 09/16/24 1000  Code Status: DNR. Family Communication: Updated patient's wife at bedside. Level of care: Progressive Status is: Inpatient Remains  inpatient appropriate because: Severe sepsis due to possible UTI, ongoing diarrhea   Final disposition: LTC?   55 minutes with more than 50% spent in reviewing records, counseling patient/family and coordinating care.  Consultants:  Palliative medicine  Procedures: None  Microbiology summarized: COVID-19, influenza and RSV PCR nonreactive Blood culture NGTD Urine culture with multiple species C. difficile negative GIP pending  Objective: Vitals:   09/16/24 0924 09/16/24 1208 09/17/24 0641 09/17/24 1100  BP: (!) 160/66 (!) 143/92 (!) 151/105 122/72  Pulse: 65 80 72 65  Resp:  14 17 14   Temp:  98.1 F (36.7 C) 98.8 F (37.1 C) 98 F (36.7 C)  TempSrc:    Oral  SpO2: 97% 97% 97% 100%  Weight:      Height:        Examination:  GENERAL: No apparent distress.  Nontoxic. HEENT: MMM.  Vision and hearing grossly intact.  NECK: Supple.  No apparent JVD.  RESP:  No IWOB.  Fair aeration bilaterally. CVS:  RRR. Heart sounds normal.  ABD/GI/GU: BS+. Abd soft, NTND.  Rectal tube in place. MSK/EXT:  Moves extremities.  Significant muscle mass and subcu fat loss. SKIN: no apparent skin lesion or wound NEURO: Sleepy but wakes to noxious stimuli.  No apparent focal neuro deficit but limited exam due to mental status. PSYCH: Calm. Normal affect.   Sch Meds:  Scheduled Meds:  amLODipine   5 mg Oral Daily   citalopram   20 mg Oral QHS   divalproex   250 mg Oral Q12H   enoxaparin  (LOVENOX ) injection  40 mg Subcutaneous Q24H   levothyroxine   75 mcg Oral Q0600   losartan   50 mg Oral Daily   OLANZapine   15 mg Oral QHS   tamsulosin   0.4 mg Oral BID   traZODone   200 mg Oral QHS   Continuous Infusions:  cefTRIAXone  (ROCEPHIN )  IV 2 g (09/17/24 1010)   lactated ringers  125 mL/hr at 09/17/24 1126   PRN Meds:.acetaminophen , haloperidol , loperamide , melatonin, polyethylene glycol, prochlorperazine   Antimicrobials: Anti-infectives (From admission, onward)    Start     Dose/Rate  Route Frequency Ordered Stop   09/16/24 0800  cefTRIAXone  (ROCEPHIN ) 2 g in sodium chloride  0.9 % 100 mL IVPB        2 g 200 mL/hr over 30 Minutes Intravenous Every 24 hours 09/15/24 2234     09/15/24 1915  cefTRIAXone  (ROCEPHIN ) 1 g in sodium chloride  0.9 % 100 mL IVPB        1 g 200 mL/hr over 30 Minutes Intravenous  Once 09/15/24 1900 09/15/24 2025        I have personally reviewed the following labs and images: CBC: Recent Labs  Lab 09/15/24 1845 09/16/24 0349 09/17/24 0327  WBC 17.4* 13.1* 9.4  NEUTROABS 15.5*  --   --   HGB 12.9* 11.5* 10.3*  HCT 41.7 36.4* 33.4*  MCV 90.5 89.4 89.1  PLT 256 239 201   BMP &GFR Recent Labs  Lab 09/15/24 1845 09/16/24 0349 09/17/24 0327  NA 145 143 139  K 4.2 4.0 3.6  CL 107 108 105  CO2 25 27 27   GLUCOSE 115* 101* 88  BUN 34* 31* 22  CREATININE 1.25* 1.12  0.85  CALCIUM  9.4 9.2 8.6*  MG  --  2.1 1.7  PHOS  --  3.4 2.7   Estimated Creatinine Clearance: 48 mL/min (by C-G formula based on SCr of 0.85 mg/dL). Liver & Pancreas: Recent Labs  Lab 09/15/24 1845 09/17/24 0327  AST 20  --   ALT 17  --   ALKPHOS 99  --   BILITOT 0.7  --   PROT 7.4  --   ALBUMIN 3.8 3.0*   No results for input(s): LIPASE, AMYLASE in the last 168 hours. No results for input(s): AMMONIA in the last 168 hours. Diabetic: No results for input(s): HGBA1C in the last 72 hours. No results for input(s): GLUCAP in the last 168 hours. Cardiac Enzymes: No results for input(s): CKTOTAL, CKMB, CKMBINDEX, TROPONINI in the last 168 hours. Recent Labs    04/26/24 0407  PROBNP 520.0*   Coagulation Profile: Recent Labs  Lab 09/15/24 1845  INR 1.0   Thyroid  Function Tests: No results for input(s): TSH, T4TOTAL, FREET4, T3FREE, THYROIDAB in the last 72 hours. Lipid Profile: No results for input(s): CHOL, HDL, LDLCALC, TRIG, CHOLHDL, LDLDIRECT in the last 72 hours. Anemia Panel: No results for input(s):  VITAMINB12, FOLATE, FERRITIN, TIBC, IRON, RETICCTPCT in the last 72 hours. Urine analysis:    Component Value Date/Time   COLORURINE AMBER (A) 09/15/2024 2316   APPEARANCEUR CLEAR 09/15/2024 2316   LABSPEC 1.020 09/15/2024 2316   PHURINE 5.0 09/15/2024 2316   GLUCOSEU NEGATIVE 09/15/2024 2316   HGBUR NEGATIVE 09/15/2024 2316   BILIRUBINUR SMALL (A) 09/15/2024 2316   KETONESUR NEGATIVE 09/15/2024 2316   PROTEINUR 30 (A) 09/15/2024 2316   NITRITE NEGATIVE 09/15/2024 2316   LEUKOCYTESUR TRACE (A) 09/15/2024 2316   Sepsis Labs: Invalid input(s): PROCALCITONIN, LACTICIDVEN  Microbiology: Recent Results (from the past 240 hours)  Blood Culture (routine x 2)     Status: None (Preliminary result)   Collection Time: 09/15/24  6:45 PM   Specimen: BLOOD LEFT FOREARM  Result Value Ref Range Status   Specimen Description   Final    BLOOD LEFT FOREARM Performed at [facility], [address], [place], [place] [address]    Special Requests   Final    BOTTLES DRAWN AEROBIC AND ANAEROBIC Blood Culture adequate volume Performed at [facility], [address], [place], [place] [address]    Culture   Final    NO GROWTH < 12 HOURS Performed at [facility], [address], [place], [place] [address]    Report Status PENDING  Incomplete  Blood Culture (routine x 2)     Status: None (Preliminary result)   Collection Time: 09/15/24  8:06 PM   Specimen: Right Antecubital; Blood  Result Value Ref Range Status   Specimen Description   Final    RIGHT ANTECUBITAL Performed at [facility], [address], [place], [place] [address]    Special Requests   Final    BOTTLES DRAWN AEROBIC AND ANAEROBIC Blood Culture adequate volume Performed at [facility], [address] [place], [place] [address]    Culture   Final    NO GROWTH < 12 HOURS Performed at [facility], [address],  [place], [place] [address]    Report Status PENDING  Incomplete  Resp panel by RT-PCR (RSV, Flu A&B, Covid) Anterior Nasal Swab     Status: None   Collection Time: 09/15/24  8:53 PM   Specimen: Anterior  Nasal Swab  Result Value Ref Range Status   SARS Coronavirus 2 by RT PCR NEGATIVE NEGATIVE Final    Comment: (NOTE) SARS-CoV-2 target nucleic acids are NOT DETECTED.  The SARS-CoV-2 RNA is generally detectable in upper respiratory specimens during the acute phase of infection. The lowest concentration of SARS-CoV-2 viral copies this assay can detect is 138 copies/mL. A negative result does not preclude SARS-Cov-2 infection and should not be used as the sole basis for treatment or other patient management decisions. A negative result may occur with  improper specimen collection/handling, submission of specimen other than nasopharyngeal swab, presence of viral mutation(s) within the areas targeted by this assay, and inadequate number of viral copies(<138 copies/mL). A negative result must be combined with clinical observations, patient history, and epidemiological information. The expected result is Negative.  Fact Sheet for Patients:  bloggercourse.com  Fact Sheet for Healthcare Providers:  seriousbroker.it  This test is no t yet approved or cleared by the [place]  FDA and  has been authorized for detection and/or diagnosis of SARS-CoV-2 by FDA under an Emergency Use Authorization (EUA). This EUA will remain  in effect (meaning this test can be used) for the duration of the COVID-19 declaration under Section 564(b)(1) of the Act, 21 U.S.C.section 360bbb-3(b)(1), unless the authorization is terminated  or revoked sooner.       Influenza A by PCR NEGATIVE NEGATIVE Final   Influenza B by PCR NEGATIVE NEGATIVE Final    Comment: (NOTE) The Xpert Xpress SARS-CoV-2/FLU/RSV plus assay is intended as an aid in the diagnosis of influenza  from Nasopharyngeal swab specimens and should not be used as a sole basis for treatment. Nasal washings and aspirates are unacceptable for Xpert Xpress SARS-CoV-2/FLU/RSV testing.  Fact Sheet for Patients: bloggercourse.com  Fact Sheet for Healthcare Providers: seriousbroker.it  This test is not yet approved or cleared by the [place]  FDA and has been authorized for detection and/or diagnosis of SARS-CoV-2 by FDA under an Emergency Use Authorization (EUA). This EUA will remain in effect (meaning this test can be used) for the duration of the COVID-19 declaration under Section 564(b)(1) of the Act, 21 U.S.C. section 360bbb-3(b)(1), unless the authorization is terminated or revoked.     Resp Syncytial Virus by PCR NEGATIVE NEGATIVE Final    Comment: (NOTE) Fact Sheet for Patients: bloggercourse.com  Fact Sheet for Healthcare Providers: seriousbroker.it  This test is not yet approved or cleared by the [place]  FDA and has been authorized for detection and/or diagnosis of SARS-CoV-2 by FDA under an Emergency Use Authorization (EUA). This EUA will remain in effect (meaning this test can be used) for the duration of the COVID-19 declaration under Section 564(b)(1) of the Act, 21 U.S.C. section 360bbb-3(b)(1), unless the authorization is terminated or revoked.  Performed at [facility], [address] [place], [place] [address]   Urine Culture     Status: Abnormal   Collection Time: 09/15/24 11:16 PM   Specimen: Urine, Random  Result Value Ref Range Status   Specimen Description   Final    URINE, RANDOM Performed at [facility], [address] [place], [place] [address]    Special Requests   Final    NONE Reflexed from [id number] Performed at [facility], [address] [place], [place] [address]    Culture  MULTIPLE SPECIES PRESENT, SUGGEST RECOLLECTION (A)  Final   Report Status 09/17/2024 FINAL  Final  C Difficile Quick Screen w PCR reflex  Status: None   Collection Time: 09/16/24  9:00 AM   Specimen: STOOL  Result Value Ref Range Status   C Diff antigen NEGATIVE NEGATIVE Final   C Diff toxin NEGATIVE NEGATIVE Final   C Diff interpretation No C. difficile detected.  Final    Comment: Performed at [facility], [address], [place], [place] [address]    Radiology Studies: No results found.     Paiten Boies T. Jalien Weakland [facility]  If 7PM-7AM, please contact night-coverage [email or web address] 09/17/2024, 12:24 PM   "

## 2024-09-17 NOTE — Progress Notes (Signed)
 Darryle Law 1427 [facility] Hospitalized Hospice Note   [name] is a current [facility] patient with a terminal diagnosis of cerebrovascular disease who resides at [facility]. Patient spouse requested EMS due to change in mental status and diarrhea. She preferred to have patient evaluated in ED verses nurse visit at facility. He has been admitted to hospital with diagnosis Severe sepsis secondary to presumed UTI. Per Dr. Cassandria Serve with [facility] this is a related admission.   Speech therapist saw him this morning. He has decreased awareness of PO and has been aspirating. He is lethargic and requires careful hand feeding. ST educated his wife on strategies like positioning and tactile cues that can help pt be more aware when he is offered food and drink. PT also came to the room but the pt was too lethargic to participate in therapy. Spoke with his wife by phone who said that the pt has been sleeping a lot. She explained why she had to call 911because he was turning white, his oxygen was 80% plus the diarrhea. She said that they have put in a rectal tube and are waiting for test results for the cause of the diarrhea. Let her know that [facility] liaison will stop by the room tomorrow. He is inpatient appropriate with the need for IVAB and treatment of acute medical complication.   Vital Signs: 98/65/14  122/72    spO2 100% room air   I&O: none documented   Abnormal Labs:  09/17/24 0327 Alb 3.0 (L) RBC: 3.75(L) Hemoglobin: 10.3 (L)   IV/PRN Meds:  Rocephin  1g IV Q24H, Lactated Ringers  134ml/hr IV continuous     Current plan as per Kathrin Mignon DASEN, MD on 1.26.26 Assessment and plan: Severe sepsis secondary to presumed UTI, POA: Febrile with tachypnea, leukocytosis and lactic acidosis. Started on Macrobid 09/13/24 for UTI outpatient.  UA with trace LE and few bacteria.  Culture with multiple species.  Blood culture NGTD.  He is also having ongoing diarrhea.   Abdominal exam benign.  C. difficile negative.  Sepsis physiology improving.  Hemodynamically stable. -Follow IV ceftriaxone  empirically.  -Follow GIP. -Continue IV fluid hydration with ongoing diarrhea   Lactic acidosis secondary to severe sepsis: Resolved. -Continue IV fluid hydration and IV antibiotics.   Acute metabolic encephalopathy: Likely due to sepsis and delirium Dementia with behavioral disturbance/delirium: Currently sleepy but patient was awake earlier eating breakfast. -CT head without acute finding. -Manage infection as above. -Continue IV fluid for hydration. -Hold home oxycodone  and Ativan  -Continue home Zyprexa , Depakote  and trazodone .  May consider decreasing if increased somnolence. -Hold home scheduled Haldol .  Continue as needed Haldol .   Diarrhea: Patient with liquidy diarrhea.  Abdominal exam benign.  C. difficile negative - Follow GI. - Enteric precaution pending results - Imodium  as needed - Continue IV fluid   AKI: b/l Cr ~0.7-0.8.  Likely due to poor p.o. intake and LOC in the setting of diarrhea.  AKI resolved. -Continue IV fluid hydration - Continue decreased dose of losartan .    Uncontrolled hypertension: Normotensive. - Continue home amlodipine  and decrease dose of losartan .   Concern for dysphagia: Per RN, coughing with liquid.  Per wife, or dysphagia 3 diet - Dysphagia to diet pending SLP eval - Meds in applesauce.   Goals of care: Patient with advanced dementia.  Followed by hospice outpatient.  DNR.  Wife is interested in nursing home placement.  Might be LTC - Follow-up palliative care.   Body mass index is 21.13 kg/m.  Pressure skin injury: Present on admission. Wound 09/16/24 0020 Pressure Injury Coccyx Right;Mid;Left Stage 3 -  Full thickness tissue loss. Subcutaneous fat may be visible but bone, tendon or muscle are NOT exposed. (Active)    DVT prophylaxis:  enoxaparin  (LOVENOX ) injection 40 mg Start: 09/16/24 1000   Code  Status: DNR. Family Communication: Updated patient's wife at bedside. Level of care: Progressive Status is: Inpatient Remains inpatient appropriate because: Severe sepsis due to possible UTI, ongoing diarrhea  Goals of care: Patient with advanced dementia.  Followed by hospice outpatient.  DNR. -Follow-up palliative recommendation.  Consulted on admission.    Discharge Planning: Ongoing   Family Contact: spoke with wife   IDT: updated    Goals of Care: DNR   Transfer summary and Med list sent to be placed under Media tab.    Should patient need ambulance transfer at discharge- please use [facility]) as they contract this service for our active hospice patients.    Thank you for the opportunity to participate in this patient's care, please don't hesitate to call for any hospice related questions or concerns.     Greig Basket, BSN, RN [facility] Liaison  [phone]

## 2024-09-17 NOTE — Progress Notes (Signed)
 PT Cancellation Note  Patient Details Name: Julian [name] MRN: [id number] DOB: 1926/04/17   Cancelled Treatment:    Reason Eval/Treat Not Completed: Fatigue/lethargy limiting ability to participate. PT arrived 1028 pt resting in bed. Spouse and nurse present. PT unable to rouse pt with noxious tactile stimuli and verbal cues. Spouse indicated pt is more alert in the PM. PT to return as schedule allows and continue to follow acutely.   [place], PT Acute Rehab   [place] VEAR Drone 09/17/2024, 2:21 PM

## 2024-09-18 DIAGNOSIS — Z7189 Other specified counseling: Secondary | ICD-10-CM

## 2024-09-18 LAB — RENAL FUNCTION PANEL
Albumin: 2.8 g/dL — ABNORMAL LOW (ref 3.5–5.0)
Anion gap: 7 (ref 5–15)
BUN: 18 mg/dL (ref 8–23)
CO2: 26 mmol/L (ref 22–32)
Calcium: 8.5 mg/dL — ABNORMAL LOW (ref 8.9–10.3)
Chloride: 107 mmol/L (ref 98–111)
Creatinine, Ser: 0.83 mg/dL (ref 0.61–1.24)
GFR, Estimated: >60 mL/min
Glucose, Bld: 103 mg/dL — ABNORMAL HIGH (ref 70–99)
Phosphorus: 3 mg/dL (ref 2.5–4.6)
Potassium: 3.1 mmol/L — ABNORMAL LOW (ref 3.5–5.1)
Sodium: 141 mmol/L (ref 135–145)

## 2024-09-18 LAB — CBC
HCT: 31.9 % — ABNORMAL LOW (ref 39.0–52.0)
Hemoglobin: 10.4 g/dL — ABNORMAL LOW (ref 13.0–17.0)
MCH: 28.5 pg (ref 26.0–34.0)
MCHC: 32.6 g/dL (ref 30.0–36.0)
MCV: 87.4 fL (ref 80.0–100.0)
Platelets: 191 10*3/uL (ref 150–400)
RBC: 3.65 MIL/uL — ABNORMAL LOW (ref 4.22–5.81)
RDW: 15.1 % (ref 11.5–15.5)
WBC: 8.6 10*3/uL (ref 4.0–10.5)
nRBC: 0 % (ref 0.0–0.2)

## 2024-09-18 LAB — MAGNESIUM: Magnesium: 1.7 mg/dL (ref 1.7–2.4)

## 2024-09-18 MED ORDER — GLYCOPYRROLATE 0.2 MG/ML IJ SOLN: 0.2000 mg | INTRAMUSCULAR | Status: DC | PRN

## 2024-09-18 MED ORDER — ONDANSETRON HCL 4 MG/2ML IJ SOLN: 4.0000 mg | Freq: Four times a day (QID) | INTRAMUSCULAR | Status: DC | PRN

## 2024-09-18 MED ORDER — POTASSIUM CHLORIDE 10 MEQ/100ML IV SOLN
10.0000 meq | INTRAVENOUS | Status: AC
  Filled 2024-09-18 (×3): qty 100

## 2024-09-18 MED ORDER — POTASSIUM CHLORIDE 10 MEQ/100ML IV SOLN
10.0000 meq | INTRAVENOUS | Status: DC
  Administered 2024-09-18: 10 meq via INTRAVENOUS

## 2024-09-18 MED ORDER — LORAZEPAM 2 MG/ML IJ SOLN
1.0000 mg | INTRAMUSCULAR | Status: DC | PRN
  Administered 2024-09-18 – 2024-09-19 (×4): 1 mg via INTRAVENOUS
  Filled 2024-09-18 (×4): qty 1

## 2024-09-18 MED ORDER — GLYCOPYRROLATE 1 MG PO TABS: 1.0000 mg | ORAL_TABLET | ORAL | Status: DC | PRN

## 2024-09-18 MED ORDER — HALOPERIDOL LACTATE 5 MG/ML IJ SOLN
0.5000 mg | INTRAMUSCULAR | Status: DC | PRN
  Administered 2024-09-18: 0.5 mg via INTRAVENOUS
  Filled 2024-09-18: qty 1

## 2024-09-18 MED ORDER — ONDANSETRON 4 MG PO TBDP: 4.0000 mg | ORAL_TABLET | Freq: Four times a day (QID) | ORAL | Status: DC | PRN

## 2024-09-18 MED ORDER — POLYVINYL ALCOHOL 1.4 % OP SOLN: 1.0000 [drp] | Freq: Four times a day (QID) | OPHTHALMIC | Status: DC | PRN

## 2024-09-18 MED ORDER — POTASSIUM CHLORIDE 2 MEQ/ML IV SOLN
INTRAVENOUS | Status: DC
  Filled 2024-09-18 (×3): qty 1000

## 2024-09-18 MED ORDER — LORAZEPAM 2 MG/ML PO CONC: 1.0000 mg | ORAL | Status: DC | PRN

## 2024-09-18 MED ORDER — HALOPERIDOL LACTATE 2 MG/ML PO CONC: 0.5000 mg | ORAL | Status: DC | PRN

## 2024-09-18 MED ORDER — HALOPERIDOL 0.5 MG PO TABS: 0.5000 mg | ORAL_TABLET | ORAL | Status: DC | PRN

## 2024-09-18 MED ORDER — HYDROMORPHONE HCL 1 MG/ML IJ SOLN
0.5000 mg | INTRAMUSCULAR | Status: DC | PRN
  Administered 2024-09-19: 1 mg via INTRAVENOUS
  Filled 2024-09-18: qty 1

## 2024-09-18 MED ORDER — LORAZEPAM 1 MG PO TABS: 1.0000 mg | ORAL_TABLET | ORAL | Status: DC | PRN

## 2024-09-18 NOTE — Plan of Care (Signed)
  Problem: Clinical Measurements: Goal: Ability to maintain clinical measurements within normal limits will improve Outcome: Progressing Goal: Diagnostic test results will improve Outcome: Progressing Goal: Respiratory complications will improve Outcome: Progressing   Problem: Elimination: Goal: Will not experience complications related to bowel motility Outcome: Progressing Goal: Will not experience complications related to urinary retention Outcome: Progressing   Problem: Safety: Goal: Ability to remain free from injury will improve Outcome: Progressing

## 2024-09-18 NOTE — Progress Notes (Signed)
 Darryle Law 1427 [facility] Hospitalized Hospice Note   Mr. Rayaan Garguilo is a current [facility] patient with a terminal diagnosis of cerebrovascular disease who resides at [facility]. Patient spouse requested EMS due to change in mental status and diarrhea. She preferred to have patient evaluated in ED verses nurse visit at facility. He has been admitted to hospital with diagnosis Severe sepsis secondary to presumed UTI. Per Dr. Cassandria Serve with [facility] this is a related admission.  Met with pts wife at the bedside. Mr. Hickox was sleeping mostly but opened his eyes and said hello. Mrs. Konecny said that he has been sleeping all of the time and is not eating. He is still having a lot of diarrhea. She is starting to think that she is just keeping him alive for her own benefit. She said that he did not want to be kept alive artificially.  As clinical research associate was leaving the room, Dr. Chilton came to see the patient. Mrs. Hauser  has decided to place him on comfort care. She is hoping that he will be appropriate for [facility] soon. Will evaluate tomorrow for appropriatenss.  Vital Signs: 98/72/16  142/79    spO2 96% room air I&O: from 1.26: 3126/2100   Abnormal Labs:  09/18/24: K: 3.1 (L) RBC: 3.65(L) Hemoglobin: 10.4 (L) Hct 31.9 (L)   IV/PRN Meds:  Rocephin  1g IV Q24H, Lactated Ringers  125ml/hr IV continuous Current plan as per Kathrin Mignon DASEN, MD on 1.27.26:  Severe sepsis secondary to presumed UTI, POA: Febrile with tachypnea, leukocytosis and lactic acidosis. Started on Macrobid 09/13/24 for UTI outpatient.  UA with trace LE and few bacteria.  Culture with multiple species.  Blood culture NGTD.  He is also having ongoing diarrhea.  Abdominal exam benign.  C. difficile and GIP negative.  Sepsis physiology resolved.  Hemodynamically stable. -Follow IV ceftriaxone  empirically for a total of 5 days.  -Continue IV fluid hydration with ongoing diarrhea and poor p.o. intake    Lactic acidosis secondary to severe sepsis: Resolved. -Continue IV fluid hydration and IV antibiotics.   Acute metabolic encephalopathy: Likely due to sepsis and delirium.  Dementia with behavioral disturbance/delirium: Currently awake and oriented to self.  Follows some commands.  All extremities symmetrically. -CT head without acute finding. -Manage infection as above. -Continue IV fluid for hydration. -Continue holding oxycodone , Ativan  and scheduled Haldol . -Continue home Zyprexa , Depakote  and trazodone .  -Continue as needed Haldol .   Diarrhea: Patient with liquidy diarrhea.  Abdominal exam benign.  C. difficile and GIP negative.  About 600 cc stool from overnight. - Imodium  as needed - Continue IV fluid   Final disposition: Inpatient hospice Code Status: DNR. Family Communication: Updated patient's wife at bedside. Level of care: Progressive Status is: Inpatient Remains inpatient appropriate because: Severe sepsis due to possible UTI, ongoing diarrhea  Discharge Planning: will evaluate for [facility] transfer in the am   Family Contact: met with wife   IDT: updated    Goals of Care: DNR   Transfer summary and Med list sent to be placed under Media tab.    Should patient need ambulance transfer at discharge- please use [facility]) as they contract this service for our active hospice patients.    Thank you for the opportunity to participate in this patient's care, please don't hesitate to call for any hospice related questions or concerns.     Greig Basket, BSN, RN [facility] Liaison  [phone]

## 2024-09-18 NOTE — IPAL (Signed)
" °  Interdisciplinary Goals of Care Family Meeting   Date carried out: 09/18/2024  Location of the meeting: Bedside  Member's involved: Physician and Family Member or next of kin (wife)  Durable Power of Pensions Consultant or environmental health practitioner: Patient's wife Saw patient earlier this morning.  At that time, patient's wife was not in the room.  I went by patient's room this afternoon to to see if patient's wife is in the room.  When I walked in, she just finished talking with staff from [facility] hospice. She was told that it would be better for him to be full comfort care. She was told that [facility] can take him to [facility].  She stated that he had expressed his desire not to live in this kind of condition when he was lucid and able to talk to her. She says she held to him for long because she didn't want to lose him.  In doing that, she feels he has suffered a lot, and she doesn't want him to continue suffering.  She wanted to proceed with full comfort care with limited effect.  We have discussed what this entails including use of medication such as morphine, Ativan , IV Haldol ... to optimize his comfort.  She voiced understanding and appreciated the visit.  She says she will be back tomorrow morning.   Comfort orders placed.  Discussion: We discussed goals of care for [employer] .    Code status:   Code Status: Do not attempt resuscitation (DNR) - Comfort care   Disposition: In-patient comfort care  Time spent for the meeting: 35 minutes    Mignon ONEIDA Bump, MD  09/18/2024, 3:45 PM   "

## 2024-09-18 NOTE — Evaluation (Signed)
 Physical Therapy Brief Evaluation and Discharge Note Patient Details Name: Julian [name] MRN: [id number] DOB: 27-Feb-1926 Today's Date: 09/18/2024   History of Present Illness  89 yo male presents to therapy following hospital admission on 09/15/2024 due to AMS, diarrhea, sepsis secondary to UTI. Pt currently on hospice care and residing in Memory care unit at [facility]. Pt PMH Includes but is not limited to: HTN, dementia with behavioral disturbance, anemia, HLD and prostate ca.  Clinical Impression  Patient evaluated by Physical Therapy with no further acute PT needs identified. All education has been completed. Spouse and nursing staff present. Pt is total care and will require total lift for functional transfers. Pt is currently unable to follow one step commands and is limited due to lethargy and cognition. Spouse indicates not at cognitive baseline, however pt has been non-ambulatory for several months. Spouse is seeking SNF for LTC with hospice services.  PT is signing off. Thank you for this referral.       PT Assessment Patient does not need any further PT services  Assistance Needed at Discharge  Frequent or constant Supervision/Assistance    Equipment Recommendations None recommended by PT  Recommendations for Other Services       Precautions/Restrictions Precautions Precautions: Fall Restrictions Weight Bearing Restrictions Per Provider Order: No        Mobility  Bed Mobility Rolling: Total assist, +2 for physical assistance, +2 for safety/equipment Supine/Sidelying to sit: Total assist, 2 for physical assistance, 2 for safety/equipment Sit to supine/sidelying: Total assist, 2 for physical assistance, 2 for safety/equipment General bed mobility comments: total A x 2 no initiation of movement to participate and unable to follow one step commands  Transfers                   General transfer comment: NT recommend use of total lift     Ambulation/Gait              Home Activity Instructions    Stairs            Modified Rankin (Stroke Patients Only)        Balance Overall balance assessment: History of Falls (zero sitting balance with mod to max A and cues absent self righting stratagies)                        Pertinent Vitals/Pain   Pain Assessment Pain Assessment: No/denies pain (no apparent pain response nor report)     Home Living Family/patient expects to be discharged to:: Hospice/Palliative care (at [facility])             Additional Comments: pt is from [facility] memory care unit    Prior Function Level of Independence: Needs assistance Comments: per spouse pt has been requiring extensive assist since September 2025 and has been non ambulatory, pt has 24/7 caregiver assist at the memory care unit    UE/LE Assessment   UE ROM/Strength/Tone/Coordination: Impaired    LE ROM/Strength/Tone/Coordination: Impaired      Communication   Communication Communication: Impaired Factors Affecting Communication: Hearing impaired;Difficulty expressing self;Other (comment) (no verbal communication during PT eval)     Cognition Overall Cognitive Status: History of cognitive impairments - further impaired Comments: spouse indicates that pt is not curently at baseline for cognition, pt is unable to follow one step commands with increased time and multimodal cues, including squeezing hand, any initiation of functional mobility tasks,  pt limits eyes open and requires  encouragement to swallow with hx of pocketing prior to hospital admission     General Comments      Exercises     Assessment/Plan    PT Problem List         PT Visit Diagnosis Muscle weakness (generalized) (M62.81);Difficulty in walking, not elsewhere classified (R26.2);History of falling (Z91.81);Adult, failure to thrive (R62.7)    No Skilled PT Patient unable to participate in therapy   Co-evaluation                 AMPAC 6 Clicks Help needed turning from your back to your side while in a flat bed without using bedrails?: Total Help needed moving from lying on your back to sitting on the side of a flat bed without using bedrails?: Total         6 Click Score: 2      End of Session   Activity Tolerance: Patient limited by lethargy Patient left: in bed;with nursing/sitter in room;with family/visitor present Nurse Communication: Mobility status;Need for lift equipment PT Visit Diagnosis: Muscle weakness (generalized) (M62.81);Difficulty in walking, not elsewhere classified (R26.2);History of falling (Z91.81);Adult, failure to thrive (R62.7)     Time: 8654-8645 PT Time Calculation (min) (ACUTE ONLY): 9 min  Charges:   PT Evaluation $PT Eval Low Complexity: 1 Low      [place], PT Acute Rehab   [place] VEAR Drone  09/18/2024, 2:51 PM

## 2024-09-18 NOTE — Progress Notes (Signed)
 " PROGRESS NOTE  Julian [name] FMW:[id number] DOB: Jul 18, 1926   PCP: Loreli Elsie [name], MD  Patient is from: Memory care.  Minimally ambulates with rolling walker at baseline.  Followed by hospice.  DOA: 09/15/2024 LOS: 3  Chief complaints Chief Complaint  Patient presents with   Altered Mental Status     Brief Narrative / Interim history: 89 year old M with PMH of dementia with behavioral disturbance, CAD, HTN, HLD and TIA brought to ED by EMS from memory care due to altered mental status and acute hematuria.  Per wife, patient was talking and alert when she saw him in the morning but visibly shaking and nonverbal when she returned about 4 PM.  Patient was recently started on Macrobid for possible UTI.   In ED, febrile to 100.6.  WBC 17.4 with left shift.  COVID-19, influenza and RSV PCR nonreactive.  CT head without acute finding.  Code sepsis activated.  Lactic acid, UA and cultures ordered.  Started on IV fluid and ceftriaxone  and admitted with working diagnosis of severe sepsis due to possible urinary tract infection.   Blood cultures NGTD.  Urine culture with multiple species.  Patient with ongoing diarrhea.  C. difficile and GIP negative.  Remains on IV fluid for adequate hydration.   Subjective: Seen and examined earlier this morning.  No major events overnight or this morning.  He is awake this morning.  Oriented to his name and follow some commands.  No apparent distress.  Assessment and plan: Severe sepsis secondary to presumed UTI, POA: Febrile with tachypnea, leukocytosis and lactic acidosis. Started on Macrobid 09/13/24 for UTI outpatient.  UA with trace LE and few bacteria.  Culture with multiple species.  Blood culture NGTD.  He is also having ongoing diarrhea.  Abdominal exam benign.  C. difficile and GIP negative.  Sepsis physiology resolved.  Hemodynamically stable. -Follow IV ceftriaxone  empirically for a total of 5 days.  -Continue IV fluid hydration with ongoing  diarrhea and poor p.o. intake   Lactic acidosis secondary to severe sepsis: Resolved. -Continue IV fluid hydration and IV antibiotics.   Acute metabolic encephalopathy: Likely due to sepsis and delirium.  Dementia with behavioral disturbance/delirium: Currently awake and oriented to self.  Follows some commands.  All extremities symmetrically. -CT head without acute finding. -Manage infection as above. -Continue IV fluid for hydration. -Continue holding oxycodone , Ativan  and scheduled Haldol . -Continue home Zyprexa , Depakote  and trazodone .  -Continue as needed Haldol .  Diarrhea: Patient with liquidy diarrhea.  Abdominal exam benign.  C. difficile and GIP negative.  About 600 cc stool from overnight. - Imodium  as needed - Continue IV fluid  AKI: b/l Cr ~0.7-0.8.  Likely due to poor p.o. intake and LOC in the setting of diarrhea.  AKI resolved. -Continue IV fluid hydration -Continue decreased dose of losartan .    Uncontrolled hypertension: Normotensive. - Continue home amlodipine  and decrease dose of losartan .  Dysphagia: Per RN, coughing with liquid.  Per wife, or dysphagia 3 diet - SLP recommended dysphagia 1 diet. - Meds in applesauce.  Hypokalemia -Monitor replenish K and Mg as appropriate  Goals of care: Patient with advanced dementia.  Followed by hospice outpatient.  DNR.  Wife is interested in nursing home placement.  Might be LTC - Follow-up palliative care.  Body mass index is 21.13 kg/m.         Pressure skin injury: Present on admission. Wound 09/16/24 0020 Pressure Injury Coccyx Right;Mid;Left Stage 3 -  Full thickness tissue loss. Subcutaneous fat  may be visible but bone, tendon or muscle are NOT exposed. (Active)   DVT prophylaxis:  enoxaparin  (LOVENOX ) injection 40 mg Start: 09/16/24 1000  Code Status: DNR. Family Communication: None at bedside today. Level of care: Med-Surg Status is: Inpatient Remains inpatient appropriate because: Severe sepsis due  to possible UTI, ongoing diarrhea   Final disposition: LTC?   55 minutes with more than 50% spent in reviewing records, counseling patient/family and coordinating care.  Consultants:  Palliative medicine  Procedures: None  Microbiology summarized: COVID-19, influenza and RSV PCR nonreactive Blood culture NGTD Urine culture with multiple species C. difficile negative GIP negative  Objective: Vitals:   09/17/24 1545 09/17/24 1900 09/17/24 1945 09/18/24 0634  BP: (!) 145/64 (!) 146/105  (!) 142/79  Pulse:   75 72  Resp: 17   16  Temp:  97.8 F (36.6 C)  98 F (36.7 C)  TempSrc:   Axillary Oral  SpO2:   96% 96%  Weight:      Height:        Examination:  GENERAL: No apparent distress.  Nontoxic. HEENT: MMM.  Vision and hearing grossly intact.  NECK: Supple.  No apparent JVD.  RESP:  No IWOB.  Fair aeration bilaterally. CVS:  RRR. Heart sounds normal.  ABD/GI/GU: BS+. Abd soft, NTND.  Rectal tube in place. MSK/EXT:  Moves extremities.  Significant muscle mass and subcu fat loss. SKIN: no apparent skin lesion or wound NEURO: Awake.  Oriented to self.  Follows some commands.  Moves all extremities.  PSYCH: Calm. Normal affect.   Sch Meds:  Scheduled Meds:  amLODipine   5 mg Oral Daily   citalopram   20 mg Oral QHS   divalproex   250 mg Oral Q12H   enoxaparin  (LOVENOX ) injection  40 mg Subcutaneous Q24H   levothyroxine   75 mcg Oral Q0600   losartan   50 mg Oral Daily   OLANZapine   15 mg Oral QHS   tamsulosin   0.4 mg Oral BID   traZODone   200 mg Oral QHS   Continuous Infusions:  cefTRIAXone  (ROCEPHIN )  IV 2 g (09/17/24 1010)   lactated ringers  1,000 mL with potassium chloride  20 mEq infusion     potassium chloride      PRN Meds:.acetaminophen , haloperidol , loperamide , melatonin, polyethylene glycol, prochlorperazine   Antimicrobials: Anti-infectives (From admission, onward)    Start     Dose/Rate Route Frequency Ordered Stop   09/16/24 0800  cefTRIAXone   (ROCEPHIN ) 2 g in sodium chloride  0.9 % 100 mL IVPB        2 g 200 mL/hr over 30 Minutes Intravenous Every 24 hours 09/15/24 2234     09/15/24 1915  cefTRIAXone  (ROCEPHIN ) 1 g in sodium chloride  0.9 % 100 mL IVPB        1 g 200 mL/hr over 30 Minutes Intravenous  Once 09/15/24 1900 09/15/24 2025        I have personally reviewed the following labs and images: CBC: Recent Labs  Lab 09/15/24 1845 09/16/24 0349 09/17/24 0327 09/18/24 0424  WBC 17.4* 13.1* 9.4 8.6  NEUTROABS 15.5*  --   --   --   HGB 12.9* 11.5* 10.3* 10.4*  HCT 41.7 36.4* 33.4* 31.9*  MCV 90.5 89.4 89.1 87.4  PLT 256 239 201 191   BMP &GFR Recent Labs  Lab 09/15/24 1845 09/16/24 0349 09/17/24 0327 09/18/24 0424  NA 145 143 139 141  K 4.2 4.0 3.6 3.1*  CL 107 108 105 107  CO2 25 27 27 26   GLUCOSE  115* 101* 88 103*  BUN 34* 31* 22 18  CREATININE 1.25* 1.12 0.85 0.83  CALCIUM  9.4 9.2 8.6* 8.5*  MG  --  2.1 1.7 1.7  PHOS  --  3.4 2.7 3.0   Estimated Creatinine Clearance: 49.2 mL/min (by C-G formula based on SCr of 0.83 mg/dL). Liver & Pancreas: Recent Labs  Lab 09/15/24 1845 09/17/24 0327 09/18/24 0424  AST 20  --   --   ALT 17  --   --   ALKPHOS 99  --   --   BILITOT 0.7  --   --   PROT 7.4  --   --   ALBUMIN 3.8 3.0* 2.8*   No results for input(s): LIPASE, AMYLASE in the last 168 hours. No results for input(s): AMMONIA in the last 168 hours. Diabetic: No results for input(s): HGBA1C in the last 72 hours. No results for input(s): GLUCAP in the last 168 hours. Cardiac Enzymes: No results for input(s): CKTOTAL, CKMB, CKMBINDEX, TROPONINI in the last 168 hours. Recent Labs    04/26/24 0407  PROBNP 520.0*   Coagulation Profile: Recent Labs  Lab 09/15/24 1845  INR 1.0   Thyroid  Function Tests: No results for input(s): TSH, T4TOTAL, FREET4, T3FREE, THYROIDAB in the last 72 hours. Lipid Profile: No results for input(s): CHOL, HDL, LDLCALC, TRIG,  CHOLHDL, LDLDIRECT in the last 72 hours. Anemia Panel: No results for input(s): VITAMINB12, FOLATE, FERRITIN, TIBC, IRON, RETICCTPCT in the last 72 hours. Urine analysis:    Component Value Date/Time   COLORURINE AMBER (A) 09/15/2024 2316   APPEARANCEUR CLEAR 09/15/2024 2316   LABSPEC 1.020 09/15/2024 2316   PHURINE 5.0 09/15/2024 2316   GLUCOSEU NEGATIVE 09/15/2024 2316   HGBUR NEGATIVE 09/15/2024 2316   BILIRUBINUR SMALL (A) 09/15/2024 2316   KETONESUR NEGATIVE 09/15/2024 2316   PROTEINUR 30 (A) 09/15/2024 2316   NITRITE NEGATIVE 09/15/2024 2316   LEUKOCYTESUR TRACE (A) 09/15/2024 2316   Sepsis Labs: Invalid input(s): PROCALCITONIN, LACTICIDVEN  Microbiology: Recent Results (from the past 240 hours)  Blood Culture (routine x 2)     Status: None (Preliminary result)   Collection Time: 09/15/24  6:45 PM   Specimen: BLOOD LEFT FOREARM  Result Value Ref Range Status   Specimen Description   Final    BLOOD LEFT FOREARM Performed at [facility], [address], [place], [place] [address]    Special Requests   Final    BOTTLES DRAWN AEROBIC AND ANAEROBIC Blood Culture adequate volume Performed at [facility], [address], [place], [place] [address]    Culture   Final    NO GROWTH 2 DAYS Performed at [facility], [address], [place], [place] [address]    Report Status PENDING  Incomplete  Blood Culture (routine x 2)     Status: None (Preliminary result)   Collection Time: 09/15/24  8:06 PM   Specimen: Right Antecubital; Blood  Result Value Ref Range Status   Specimen Description   Final    RIGHT ANTECUBITAL Performed at [facility], [address], [place], [place] [address]    Special Requests   Final    BOTTLES DRAWN AEROBIC AND ANAEROBIC Blood Culture adequate volume Performed at [facility], [address], [place], [place] [address]    Culture   Final     NO GROWTH 2 DAYS Performed at [facility], [address], [place], [place] [address]    Report  Status PENDING  Incomplete  Resp panel by RT-PCR (RSV, Flu A&B, Covid) Anterior Nasal Swab     Status: None   Collection Time: 09/15/24  8:53 PM   Specimen: Anterior Nasal Swab  Result Value Ref Range Status   SARS Coronavirus 2 by RT PCR NEGATIVE NEGATIVE Final    Comment: (NOTE) SARS-CoV-2 target nucleic acids are NOT DETECTED.  The SARS-CoV-2 RNA is generally detectable in upper respiratory specimens during the acute phase of infection. The lowest concentration of SARS-CoV-2 viral copies this assay can detect is 138 copies/mL. A negative result does not preclude SARS-Cov-2 infection and should not be used as the sole basis for treatment or other patient management decisions. A negative result may occur with  improper specimen collection/handling, submission of specimen other than nasopharyngeal swab, presence of viral mutation(s) within the areas targeted by this assay, and inadequate number of viral copies(<138 copies/mL). A negative result must be combined with clinical observations, patient history, and epidemiological information. The expected result is Negative.  Fact Sheet for Patients:  bloggercourse.com  Fact Sheet for Healthcare Providers:  seriousbroker.it  This test is no t yet approved or cleared by the [place]  FDA and  has been authorized for detection and/or diagnosis of SARS-CoV-2 by FDA under an Emergency Use Authorization (EUA). This EUA will remain  in effect (meaning this test can be used) for the duration of the COVID-19 declaration under Section 564(b)(1) of the Act, 21 U.S.C.section 360bbb-3(b)(1), unless the authorization is terminated  or revoked sooner.       Influenza A by PCR NEGATIVE NEGATIVE Final   Influenza B by PCR NEGATIVE NEGATIVE Final    Comment: (NOTE) The Xpert Xpress  SARS-CoV-2/FLU/RSV plus assay is intended as an aid in the diagnosis of influenza from Nasopharyngeal swab specimens and should not be used as a sole basis for treatment. Nasal washings and aspirates are unacceptable for Xpert Xpress SARS-CoV-2/FLU/RSV testing.  Fact Sheet for Patients: bloggercourse.com  Fact Sheet for Healthcare Providers: seriousbroker.it  This test is not yet approved or cleared by the [place]  FDA and has been authorized for detection and/or diagnosis of SARS-CoV-2 by FDA under an Emergency Use Authorization (EUA). This EUA will remain in effect (meaning this test can be used) for the duration of the COVID-19 declaration under Section 564(b)(1) of the Act, 21 U.S.C. section 360bbb-3(b)(1), unless the authorization is terminated or revoked.     Resp Syncytial Virus by PCR NEGATIVE NEGATIVE Final    Comment: (NOTE) Fact Sheet for Patients: bloggercourse.com  Fact Sheet for Healthcare Providers: seriousbroker.it  This test is not yet approved or cleared by the [place]  FDA and has been authorized for detection and/or diagnosis of SARS-CoV-2 by FDA under an Emergency Use Authorization (EUA). This EUA will remain in effect (meaning this test can be used) for the duration of the COVID-19 declaration under Section 564(b)(1) of the Act, 21 U.S.C. section 360bbb-3(b)(1), unless the authorization is terminated or revoked.  Performed at [facility], [address], [place], [place] [address]   Urine Culture     Status: Abnormal   Collection Time: 09/15/24 11:16 PM   Specimen: Urine, Random  Result Value Ref Range Status   Specimen Description   Final    URINE, RANDOM Performed at [facility], [address] [place], [place] [address]    Special Requests   Final    NONE Reflexed from [id number] Performed at [facility], [address] [place], [place] [address]    Culture MULTIPLE SPECIES PRESENT, SUGGEST RECOLLECTION (A)  Final   Report Status 09/17/2024 FINAL  Final  C Difficile Quick Screen w PCR reflex     Status: None   Collection Time: 09/16/24  9:00 AM   Specimen: STOOL  Result Value Ref Range Status   C Diff antigen NEGATIVE NEGATIVE Final   C Diff toxin NEGATIVE NEGATIVE Final   C Diff interpretation No C. difficile detected.  Final    Comment: Performed at [facility], [address], [place], [place] [address]  Gastrointestinal Panel by PCR , Stool     Status: None   Collection Time: 09/17/24 10:37 AM   Specimen: Stool  Result Value Ref Range Status   Campylobacter species NOT DETECTED NOT DETECTED Final   Plesimonas shigelloides NOT DETECTED NOT DETECTED Final   Salmonella species NOT DETECTED NOT DETECTED Final   Yersinia enterocolitica NOT DETECTED NOT DETECTED Final   Vibrio species NOT DETECTED NOT DETECTED Final   Vibrio cholerae NOT DETECTED NOT DETECTED Final   Enteroaggregative E coli (EAEC) NOT DETECTED NOT DETECTED Final   Enteropathogenic E coli (EPEC) NOT DETECTED NOT DETECTED Final   Enterotoxigenic E coli (ETEC) NOT DETECTED NOT DETECTED Final   Shiga like toxin producing E coli (STEC) NOT DETECTED NOT DETECTED Final   Shigella/Enteroinvasive E coli (EIEC) NOT DETECTED NOT DETECTED Final   Cryptosporidium NOT DETECTED NOT DETECTED Final   Cyclospora cayetanensis NOT DETECTED NOT DETECTED Final   Entamoeba histolytica NOT DETECTED NOT DETECTED Final   Giardia lamblia NOT DETECTED NOT DETECTED Final   Adenovirus F40/41 NOT DETECTED NOT DETECTED Final   Astrovirus NOT DETECTED NOT DETECTED Final   Norovirus GI/GII NOT DETECTED NOT DETECTED Final   Rotavirus A NOT DETECTED NOT DETECTED Final   Sapovirus (I, II, IV, and V) NOT DETECTED NOT DETECTED Final    Comment: Performed at [facility], [address]  [place], [place] [address]    Radiology Studies: No results found.     Kaarin Pardy T. Kyian [facility]  If 7PM-7AM, please contact night-coverage [email or web address] 09/18/2024, 10:15 AM   "
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
## 2024-09-19 MED ORDER — ONDANSETRON HCL 4 MG/2ML IJ SOLN: 4.0000 mg | Freq: Four times a day (QID) | INTRAMUSCULAR | Status: AC | PRN

## 2024-09-19 MED ORDER — HALOPERIDOL LACTATE 5 MG/ML IJ SOLN: 0.5000 mg | INTRAMUSCULAR | Status: AC | PRN

## 2024-09-19 MED ORDER — GLYCOPYRROLATE 1 MG PO TABS: 1.0000 mg | ORAL_TABLET | ORAL | Status: AC | PRN

## 2024-09-19 MED ORDER — HYDROMORPHONE HCL 1 MG/ML IJ SOLN: 0.5000 mg | INTRAMUSCULAR | Status: AC | PRN

## 2024-09-19 MED ORDER — POLYVINYL ALCOHOL 1.4 % OP SOLN: 1.0000 [drp] | Freq: Four times a day (QID) | OPHTHALMIC | Status: AC | PRN

## 2024-09-19 MED ORDER — LORAZEPAM 2 MG/ML IJ SOLN: 1.0000 mg | INTRAMUSCULAR | Status: AC | PRN

## 2024-09-19 MED ORDER — OLANZAPINE 15 MG PO TABS: 15.0000 mg | ORAL_TABLET | Freq: Every day | ORAL | Status: AC

## 2024-09-19 NOTE — TOC Transition Note (Signed)
 Transition of Care [facility]) - Discharge Note  Patient Details  Name: Julian [name] MRN: [id number] Date of Birth: 1925-12-15  Transition [facility]) CM/SW Contact:  Duwaine GORMAN Aran, LCSW Phone Number: 09/19/2024, 12:37 PM  Clinical Narrative: Patient has been assessed for [facility] and meets criteria. [facility] can admit today. The number for report is [phone]. Medical necessity form done; [facility] scheduled. Discharge packet completed. CSW updated spouse regarding transportation. Spouse requested that CSW not call [facility] regarding the change in discharge plan as she prefers to call herself. Care management signing off.  Final next level of care: Hospice Medical Facility Barriers to Discharge: Barriers Resolved  Patient Goals and CMS Choice Patient states their goals for this hospitalization and ongoing recovery are:: [facility] CMS Medicare.gov Compare Post Acute Care list provided to:: Patient Represenative (must comment) Choice offered to / list presented to : Spouse  Discharge Placement       Patient chooses bed at: Other - please specify in the comment section below: [facility]) Patient to be transferred to facility by: [facility] Name of family member notified: [place] Patient and family notified of of transfer: 09/19/24  Discharge Plan and Services Additional resources added to the After Visit Summary for   In-house Referral: Clinical Social Work Post Acute Care Choice: Hospice          DME Arranged: N/A DME Agency: NA  Social Drivers of Health (SDOH) Interventions SDOH Screenings   Food Insecurity: No Food Insecurity (09/16/2024)  Housing: Low Risk (09/16/2024)  Transportation Needs: No Transportation Needs (09/16/2024)  Utilities: Not At Risk (09/16/2024)  Social Connections: Moderately Isolated (09/16/2024)  Tobacco Use: Medium Risk (09/15/2024)   Readmission Risk Interventions    09/18/2024    1:08 PM 06/26/2024    3:01 PM 06/15/2024    1:35 PM   Readmission Risk Prevention Plan  Transportation Screening Complete Complete Complete  PCP or Specialist Appt within 5-7 Days  Complete Complete  Home Care Screening  Complete Complete  Medication Review (RN CM)  Complete Complete  Medication Review Oceanographer) Complete    HRI or Home Care Consult Complete    SW Recovery Care/Counseling Consult Complete    Palliative Care Screening Not Applicable    Skilled Nursing Facility Not Applicable

## 2024-09-19 NOTE — Discharge Summary (Signed)
 "  Physician Discharge Summary  Julian [name] FMW:[id number] DOB: [birth year] DOA: 09/15/2024  PCP: Loreli Elsie [name], MD  Admit date: 09/15/2024 Discharge date: 09/19/24  Admitted From: Memory care Disposition:  Residential hospice, [facility] for end of life care   Discharge Condition: Stable for transfer CODE STATUS: DNR    Hospital course 89 year old M with PMH of dementia with behavioral disturbance, CAD, HTN, HLD and TIA brought to ED by EMS from memory care due to altered mental status and acute hematuria.  Per wife, patient was talking and alert when she saw him in the morning but visibly shaking and nonverbal when she returned about 4 PM.  Patient was recently started on Macrobid for possible UTI.    In ED, febrile to 100.6.  WBC 17.4 with left shift.  COVID-19, influenza and RSV PCR nonreactive.  CT head without acute finding.  Code sepsis activated.  Lactic acid, UA and cultures ordered.  Started on IV fluid and ceftriaxone  and admitted with working diagnosis of severe sepsis due to possible urinary tract infection.    Blood cultures NGTD.  Urine culture with multiple species.  Patient with ongoing diarrhea.  C. difficile and GIP negative.  Continued on IV fluid.   Patient was transition to full comfort care after discussion with patient's wife on 09/18/2024.  Plan for transfer to [facility] once he has bed.  See individual problem list below for more.   Problems addressed during this hospitalization End-of-life care/full comfort care-started on 09/18/2024 after discussion with patient's wife. -Continue as needed meds for comfort. -Transfer to [facility]  -Continue foley   Severe sepsis secondary to presumed UTI, POA: Febrile with tachypnea, leukocytosis and lactic acidosis. Started on Macrobid 09/13/24 for UTI outpatient.  UA with trace LE and few bacteria.  Culture with multiple species.  Blood culture NGTD.  He is also having ongoing diarrhea.  Abdominal exam  benign.  C. difficile and GIP negative.  Sepsis physiology resolved.  Hemodynamically stable. -Received IV ceftriaxone  for 3 days.   Lactic acidosis secondary to severe sepsis: Resolved.   Acute metabolic encephalopathy: Likely due to sepsis and delirium.  Dementia with behavioral disturbance/delirium: Currently awake and oriented to self.  Follows some commands.  All extremities symmetrically. -CT head without acute finding. - Continue home Zyprexa  - IV Haldol  as needed - IV Dilaudid  and Ativan  as needed   Diarrhea: Patient with liquidy diarrhea.  Abdominal exam benign.  C. difficile and GIP negative.   AKI: b/l Cr ~0.7-0.8.  Likely due to poor p.o. intake and LOC in the setting of diarrhea.  AKI resolved.   Uncontrolled hypertension: Normotensive.   Dysphagia: Per RN, coughing with liquid.  Per wife, or dysphagia 3 diet - SLP recommended dysphagia 1 diet.   Hypokalemia: Resolved. Body mass index is 21.13 kg/m.        Wound 09/16/24 0020 Pressure Injury Coccyx Right;Mid;Left Stage 3 -  Full thickness tissue loss. Subcutaneous fat may be visible but bone, tendon or muscle are NOT exposed. (Active)    Consultations: [facility]  Time spent 35  minutes  Vital signs Vitals:   09/17/24 1900 09/17/24 1945 09/18/24 0634 09/18/24 1401  BP: (!) 146/105  (!) 142/79 (!) 137/55  Pulse:  75 72 64  Temp: 97.8 F (36.6 C)  98 F (36.7 C) 97.9 F (36.6 C)  Resp:   16 14  Height:      Weight:      SpO2:  96% 96%  97%  TempSrc:  Axillary Oral   BMI (Calculated):         Discharge exam  GENERAL: No apparent distress.  RESP:  No IWOB.  On room air. GI: Rectal tube in place. GU: foley in place MSK/EXT:  Moves extremities.  Significant muscle mass and subcu fat loss. SKIN: no apparent skin lesion or wound NEURO: Sleepy. B/l mittens PSYCH:Sleepy  Discharge Instructions  Allergies as of 09/19/2024   No Known Allergies      Medication List     STOP taking these  medications    acetaminophen  500 MG tablet Commonly known as: TYLENOL    amLODipine  5 MG tablet Commonly known as: NORVASC    citalopram  20 MG tablet Commonly known as: CELEXA    divalproex  125 MG capsule Commonly known as: DEPAKOTE  SPRINKLE   fluticasone 50 MCG/ACT nasal spray Commonly known as: FLONASE   haloperidol  2 MG tablet Commonly known as: HALDOL    levothyroxine  75 MCG tablet Commonly known as: SYNTHROID    LORazepam  0.5 MG tablet Commonly known as: ATIVAN  Replaced by: LORazepam  2 MG/ML injection   losartan  100 MG tablet Commonly known as: COZAAR    oxyCODONE  5 MG immediate release tablet Commonly known as: Roxicodone    Systane Complete 0.6 % Soln Generic drug: Propylene Glycol   tamsulosin  0.4 MG Caps capsule Commonly known as: FLOMAX    traZODone  100 MG tablet Commonly known as: DESYREL        TAKE these medications    artificial tears ophthalmic solution Place 1 drop into both eyes 4 (four) times daily as needed for dry eyes.   glycopyrrolate  1 MG tablet Commonly known as: ROBINUL  Take 1 tablet (1 mg total) by mouth every 4 (four) hours as needed (excessive secretions).   haloperidol  lactate 5 MG/ML injection Commonly known as: HALDOL  Inject 0.1 mLs (0.5 mg total) into the vein every 4 (four) hours as needed (or delirium).   HYDROmorphone  1 MG/ML injection Commonly known as: DILAUDID  Inject 0.5-2 mLs (0.5-2 mg total) into the vein every 30 (thirty) minutes as needed for severe pain (pain score 7-10) (To alleviate signs and symptoms of distress).   LORazepam  2 MG/ML injection Commonly known as: ATIVAN  Inject 0.5 mLs (1 mg total) into the vein every 4 (four) hours as needed for anxiety. Replaces: LORazepam  0.5 MG tablet   OLANZapine  15 MG tablet Commonly known as: ZYPREXA  Take 1 tablet (15 mg total) by mouth at bedtime. What changed:  medication strength how much to take additional instructions Another medication with the same name was  removed. Continue taking this medication, and follow the directions you see here.   ondansetron  4 MG/2ML Soln injection Commonly known as: ZOFRAN  Inject 2 mLs (4 mg total) into the vein every 6 (six) hours as needed for nausea.         Procedures/Studies:   CT Head Wo Contrast Result Date: 09/15/2024 EXAM: CT HEAD WITHOUT CONTRAST 09/15/2024 08:12:12 PM TECHNIQUE: CT of the head was performed without the administration of intravenous contrast. Automated exposure control, iterative reconstruction, and/or weight based adjustment of the mA/kV was utilized to reduce the radiation dose to as low as reasonably achievable. COMPARISON: 07/13/2024 CLINICAL HISTORY: Altered mental status. FINDINGS: BRAIN AND VENTRICLES: Generalized atrophic changes and chronic white matter ischemic changes are seen. No acute hemorrhage or acute infarct. No extra-axial collection. No mass effect or midline shift. ORBITS: No acute abnormality. SINUSES: No acute abnormality. SOFT TISSUES AND SKULL: No acute soft tissue abnormality. No skull fracture. IMPRESSION: 1. Chronic atrophic and ischemic  changes without acute abnormality. Electronically signed by: Oneil Devonshire MD 09/15/2024 08:17 PM EST RP Workstation: [id number]   DG Chest Port 1 View Result Date: 09/15/2024 EXAM: 1 VIEW(S) XRAY OF THE CHEST 09/15/2024 07:18:00 PM COMPARISON: 04/25/2024 CLINICAL HISTORY: Questionable sepsis. Evaluate for abnormality. FINDINGS: LUNGS AND PLEURA: No focal pulmonary opacity. No pleural effusion. No pneumothorax. HEART AND MEDIASTINUM: Aortic atherosclerosis. No acute abnormality of the cardiac and mediastinal silhouettes. BONES AND SOFT TISSUES: No acute osseous abnormality. IMPRESSION: 1. No acute cardiopulmonary abnormality. Electronically signed by: Dorethia Molt MD 09/15/2024 07:26 PM EST RP Workstation: [id number]       The results of significant diagnostics from this hospitalization (including imaging, microbiology, ancillary  and laboratory) are listed below for reference.     Microbiology: Recent Results (from the past 240 hours)  Blood Culture (routine x 2)     Status: None (Preliminary result)   Collection Time: 09/15/24  6:45 PM   Specimen: BLOOD LEFT FOREARM  Result Value Ref Range Status   Specimen Description   Final    BLOOD LEFT FOREARM Performed at [facility], [address] [place], [place] [address]    Special Requests   Final    BOTTLES DRAWN AEROBIC AND ANAEROBIC Blood Culture adequate volume Performed at [facility], [address], [place], [place] [address]    Culture   Final    NO GROWTH 3 DAYS Performed at [facility], [address], [place], [place] [address]    Report Status PENDING  Incomplete  Blood Culture (routine x 2)     Status: None (Preliminary result)   Collection Time: 09/15/24  8:06 PM   Specimen: Right Antecubital; Blood  Result Value Ref Range Status   Specimen Description   Final    RIGHT ANTECUBITAL Performed at [facility], [address] [place], [place] [address]    Special Requests   Final    BOTTLES DRAWN AEROBIC AND ANAEROBIC Blood Culture adequate volume Performed at [facility], [address], [place], [place] [address]    Culture   Final    NO GROWTH 3 DAYS Performed at [facility], [address], [place], [place] [address]    Report Status PENDING  Incomplete  Resp panel by RT-PCR (RSV, Flu A&B, Covid) Anterior Nasal Swab     Status: None   Collection Time: 09/15/24  8:53 PM   Specimen: Anterior Nasal Swab  Result Value Ref Range Status   SARS Coronavirus 2 by RT PCR NEGATIVE NEGATIVE Final    Comment: (NOTE) SARS-CoV-2 target nucleic acids are NOT DETECTED.  The SARS-CoV-2 RNA is generally detectable in upper respiratory specimens during the acute phase of infection. The lowest concentration of SARS-CoV-2 viral copies this assay can detect  is 138 copies/mL. A negative result does not preclude SARS-Cov-2 infection and should not be used as the sole basis for treatment or other patient management decisions. A negative result may occur with  improper specimen collection/handling, submission of specimen other than nasopharyngeal swab, presence of viral mutation(s) within the areas targeted by this assay, and inadequate number of viral copies(<138 copies/mL). A negative result must be combined with clinical observations, patient history, and epidemiological information. The expected result is Negative.  Fact Sheet for Patients:  bloggercourse.com  Fact Sheet for Healthcare Providers:  seriousbroker.it  This test is no t yet approved or cleared by the [place]  FDA and  has been authorized for  detection and/or diagnosis of SARS-CoV-2 by FDA under an Emergency Use Authorization (EUA). This EUA will remain  in effect (meaning this test can be used) for the duration of the COVID-19 declaration under Section 564(b)(1) of the Act, 21 U.S.C.section 360bbb-3(b)(1), unless the authorization is terminated  or revoked sooner.       Influenza A by PCR NEGATIVE NEGATIVE Final   Influenza B by PCR NEGATIVE NEGATIVE Final    Comment: (NOTE) The Xpert Xpress SARS-CoV-2/FLU/RSV plus assay is intended as an aid in the diagnosis of influenza from Nasopharyngeal swab specimens and should not be used as a sole basis for treatment. Nasal washings and aspirates are unacceptable for Xpert Xpress SARS-CoV-2/FLU/RSV testing.  Fact Sheet for Patients: bloggercourse.com  Fact Sheet for Healthcare Providers: seriousbroker.it  This test is not yet approved or cleared by the [place]  FDA and has been authorized for detection and/or diagnosis of SARS-CoV-2 by FDA under an Emergency Use Authorization (EUA). This EUA will remain in effect  (meaning this test can be used) for the duration of the COVID-19 declaration under Section 564(b)(1) of the Act, 21 U.S.C. section 360bbb-3(b)(1), unless the authorization is terminated or revoked.     Resp Syncytial Virus by PCR NEGATIVE NEGATIVE Final    Comment: (NOTE) Fact Sheet for Patients: bloggercourse.com  Fact Sheet for Healthcare Providers: seriousbroker.it  This test is not yet approved or cleared by the [place]  FDA and has been authorized for detection and/or diagnosis of SARS-CoV-2 by FDA under an Emergency Use Authorization (EUA). This EUA will remain in effect (meaning this test can be used) for the duration of the COVID-19 declaration under Section 564(b)(1) of the Act, 21 U.S.C. section 360bbb-3(b)(1), unless the authorization is terminated or revoked.  Performed at [facility], [address], [place], [place] [address]   Urine Culture     Status: Abnormal   Collection Time: 09/15/24 11:16 PM   Specimen: Urine, Random  Result Value Ref Range Status   Specimen Description   Final    URINE, RANDOM Performed at [facility], [address], [place], [place] [address]    Special Requests   Final    NONE Reflexed from [id number] Performed at [facility], [address] [place], [place] [address]    Culture MULTIPLE SPECIES PRESENT, SUGGEST RECOLLECTION (A)  Final   Report Status 09/17/2024 FINAL  Final  C Difficile Quick Screen w PCR reflex     Status: None   Collection Time: 09/16/24  9:00 AM   Specimen: STOOL  Result Value Ref Range Status   C Diff antigen NEGATIVE NEGATIVE Final   C Diff toxin NEGATIVE NEGATIVE Final   C Diff interpretation No C. difficile detected.  Final    Comment: Performed at [facility], [address], [place], [place] [address]  Gastrointestinal Panel by PCR , Stool     Status: None   Collection  Time: 09/17/24 10:37 AM   Specimen: Stool  Result Value Ref Range Status   Campylobacter species NOT DETECTED NOT DETECTED Final   Plesimonas shigelloides NOT DETECTED NOT DETECTED Final   Salmonella species NOT DETECTED NOT DETECTED Final   Yersinia enterocolitica NOT DETECTED NOT DETECTED Final   Vibrio species NOT DETECTED NOT DETECTED Final   Vibrio cholerae NOT DETECTED NOT DETECTED Final   Enteroaggregative E coli (EAEC) NOT DETECTED NOT DETECTED Final   Enteropathogenic E coli (EPEC) NOT DETECTED NOT DETECTED Final   Enterotoxigenic  E coli (ETEC) NOT DETECTED NOT DETECTED Final   Shiga like toxin producing E coli (STEC) NOT DETECTED NOT DETECTED Final   Shigella/Enteroinvasive E coli (EIEC) NOT DETECTED NOT DETECTED Final   Cryptosporidium NOT DETECTED NOT DETECTED Final   Cyclospora cayetanensis NOT DETECTED NOT DETECTED Final   Entamoeba histolytica NOT DETECTED NOT DETECTED Final   Giardia lamblia NOT DETECTED NOT DETECTED Final   Adenovirus F40/41 NOT DETECTED NOT DETECTED Final   Astrovirus NOT DETECTED NOT DETECTED Final   Norovirus GI/GII NOT DETECTED NOT DETECTED Final   Rotavirus A NOT DETECTED NOT DETECTED Final   Sapovirus (I, II, IV, and V) NOT DETECTED NOT DETECTED Final    Comment: Performed at [facility], [address] [place], [place] [address]     Labs:  CBC: Recent Labs  Lab 09/15/24 1845 09/16/24 0349 09/17/24 0327 09/18/24 0424  WBC 17.4* 13.1* 9.4 8.6  NEUTROABS 15.5*  --   --   --   HGB 12.9* 11.5* 10.3* 10.4*  HCT 41.7 36.4* 33.4* 31.9*  MCV 90.5 89.4 89.1 87.4  PLT 256 239 201 191   BMP &GFR Recent Labs  Lab 09/15/24 1845 09/16/24 0349 09/17/24 0327 09/18/24 0424  NA 145 143 139 141  K 4.2 4.0 3.6 3.1*  CL 107 108 105 107  CO2 25 27 27 26   GLUCOSE 115* 101* 88 103*  BUN 34* 31* 22 18  CREATININE 1.25* 1.12 0.85 0.83  CALCIUM  9.4 9.2 8.6* 8.5*  MG  --  2.1 1.7 1.7  PHOS  --  3.4 2.7 3.0   Estimated Creatinine  Clearance: 49.2 mL/min (by C-G formula based on SCr of 0.83 mg/dL). Liver & Pancreas: Recent Labs  Lab 09/15/24 1845 09/17/24 0327 09/18/24 0424  AST 20  --   --   ALT 17  --   --   ALKPHOS 99  --   --   BILITOT 0.7  --   --   PROT 7.4  --   --   ALBUMIN 3.8 3.0* 2.8*   No results for input(s): LIPASE, AMYLASE in the last 168 hours. No results for input(s): AMMONIA in the last 168 hours. Diabetic: No results for input(s): HGBA1C in the last 72 hours. No results for input(s): GLUCAP in the last 168 hours. Cardiac Enzymes: No results for input(s): CKTOTAL, CKMB, CKMBINDEX, TROPONINI in the last 168 hours. Recent Labs    04/26/24 0407  PROBNP 520.0*   Coagulation Profile: Recent Labs  Lab 09/15/24 1845  INR 1.0   Thyroid  Function Tests: No results for input(s): TSH, T4TOTAL, FREET4, T3FREE, THYROIDAB in the last 72 hours. Lipid Profile: No results for input(s): CHOL, HDL, LDLCALC, TRIG, CHOLHDL, LDLDIRECT in the last 72 hours. Anemia Panel: No results for input(s): VITAMINB12, FOLATE, FERRITIN, TIBC, IRON, RETICCTPCT in the last 72 hours. Urine analysis:    Component Value Date/Time   COLORURINE AMBER (A) 09/15/2024 2316   APPEARANCEUR CLEAR 09/15/2024 2316   LABSPEC 1.020 09/15/2024 2316   PHURINE 5.0 09/15/2024 2316   GLUCOSEU NEGATIVE 09/15/2024 2316   HGBUR NEGATIVE 09/15/2024 2316   BILIRUBINUR SMALL (A) 09/15/2024 2316   KETONESUR NEGATIVE 09/15/2024 2316   PROTEINUR 30 (A) 09/15/2024 2316   NITRITE NEGATIVE 09/15/2024 2316   LEUKOCYTESUR TRACE (A) 09/15/2024 2316   Sepsis Labs: Invalid input(s): PROCALCITONIN, LACTICIDVEN   SIGNED:  Divine Imber T Yumna Ebers, MD  [facility] 09/19/2024, 11:43 AM   "

## 2024-09-19 NOTE — Progress Notes (Signed)
Report called to [facility].  

## 2024-09-19 NOTE — Plan of Care (Signed)

## 2024-09-21 LAB — CULTURE, BLOOD (ROUTINE X 2)
Culture: NO GROWTH
Culture: NO GROWTH
Special Requests: ADEQUATE
Special Requests: ADEQUATE
